# Patient Record
Sex: Male | Born: 1940 | Race: Black or African American | Hispanic: No | Smoking: Current every day smoker
Health system: Southern US, Community
[De-identification: ages and names within clinical notes are randomized; demographics above are authoritative.]

## PROBLEM LIST (undated history)

## (undated) ENCOUNTER — Emergency Department (HOSPITAL_COMMUNITY): Discharge status: Against Medical Advice | Payer: Medicare Other

## (undated) DIAGNOSIS — IMO0002 Reserved for concepts with insufficient information to code with codable children: Secondary | ICD-10-CM

## (undated) DIAGNOSIS — E785 Hyperlipidemia, unspecified: Secondary | ICD-10-CM

## (undated) DIAGNOSIS — I10 Essential (primary) hypertension: Secondary | ICD-10-CM

## (undated) DIAGNOSIS — G928 Other toxic encephalopathy: Secondary | ICD-10-CM

## (undated) DIAGNOSIS — G92 Toxic encephalopathy: Secondary | ICD-10-CM

## (undated) DIAGNOSIS — I255 Ischemic cardiomyopathy: Secondary | ICD-10-CM

## (undated) DIAGNOSIS — I4892 Unspecified atrial flutter: Secondary | ICD-10-CM

## (undated) DIAGNOSIS — C189 Malignant neoplasm of colon, unspecified: Secondary | ICD-10-CM

## (undated) DIAGNOSIS — R4182 Altered mental status, unspecified: Secondary | ICD-10-CM

## (undated) DIAGNOSIS — Z8546 Personal history of malignant neoplasm of prostate: Secondary | ICD-10-CM

## (undated) DIAGNOSIS — F32A Depression, unspecified: Secondary | ICD-10-CM

## (undated) DIAGNOSIS — J449 Chronic obstructive pulmonary disease, unspecified: Secondary | ICD-10-CM

## (undated) DIAGNOSIS — Z91199 Patient's noncompliance with other medical treatment and regimen due to unspecified reason: Secondary | ICD-10-CM

## (undated) DIAGNOSIS — F1011 Alcohol abuse, in remission: Secondary | ICD-10-CM

## (undated) DIAGNOSIS — Z8679 Personal history of other diseases of the circulatory system: Secondary | ICD-10-CM

## (undated) DIAGNOSIS — J189 Pneumonia, unspecified organism: Secondary | ICD-10-CM

## (undated) DIAGNOSIS — Z8669 Personal history of other diseases of the nervous system and sense organs: Secondary | ICD-10-CM

## (undated) DIAGNOSIS — Z8659 Personal history of other mental and behavioral disorders: Secondary | ICD-10-CM

## (undated) DIAGNOSIS — Z9119 Patient's noncompliance with other medical treatment and regimen: Secondary | ICD-10-CM

## (undated) DIAGNOSIS — Z720 Tobacco use: Secondary | ICD-10-CM

## (undated) DIAGNOSIS — F329 Major depressive disorder, single episode, unspecified: Secondary | ICD-10-CM

## (undated) HISTORY — DX: Essential (primary) hypertension: I10

## (undated) HISTORY — DX: Personal history of other diseases of the nervous system and sense organs: Z86.69

## (undated) HISTORY — DX: Reserved for concepts with insufficient information to code with codable children: IMO0002

## (undated) HISTORY — DX: Depression, unspecified: F32.A

## (undated) HISTORY — DX: Alcohol abuse, in remission: F10.11

## (undated) HISTORY — DX: Personal history of malignant neoplasm of prostate: Z85.46

## (undated) HISTORY — DX: Unspecified atrial flutter: I48.92

## (undated) HISTORY — DX: Pneumonia, unspecified organism: J18.9

## (undated) HISTORY — DX: Patient's noncompliance with other medical treatment and regimen due to unspecified reason: Z91.199

## (undated) HISTORY — DX: Personal history of other mental and behavioral disorders: Z86.59

## (undated) HISTORY — DX: Altered mental status, unspecified: R41.82

## (undated) HISTORY — DX: Toxic encephalopathy: G92

## (undated) HISTORY — DX: Other toxic encephalopathy: G92.8

## (undated) HISTORY — DX: Ischemic cardiomyopathy: I25.5

## (undated) HISTORY — PX: APPENDECTOMY: SHX54

## (undated) HISTORY — DX: Major depressive disorder, single episode, unspecified: F32.9

## (undated) HISTORY — DX: Tobacco use: Z72.0

## (undated) HISTORY — DX: Chronic obstructive pulmonary disease, unspecified: J44.9

## (undated) HISTORY — DX: Hyperlipidemia, unspecified: E78.5

## (undated) HISTORY — DX: Patient's noncompliance with other medical treatment and regimen: Z91.19

## (undated) HISTORY — DX: Personal history of other diseases of the circulatory system: Z86.79

---

## 1998-08-03 ENCOUNTER — Emergency Department (HOSPITAL_COMMUNITY): Admission: EM | Admit: 1998-08-03 | Discharge: 1998-08-03 | Payer: Self-pay | Admitting: Emergency Medicine

## 2002-06-06 ENCOUNTER — Emergency Department (HOSPITAL_COMMUNITY): Admission: EM | Admit: 2002-06-06 | Discharge: 2002-06-06 | Payer: Self-pay | Admitting: Emergency Medicine

## 2003-04-21 ENCOUNTER — Emergency Department (HOSPITAL_COMMUNITY): Admission: EM | Admit: 2003-04-21 | Discharge: 2003-04-21 | Payer: Self-pay | Admitting: Emergency Medicine

## 2004-04-28 ENCOUNTER — Inpatient Hospital Stay (HOSPITAL_COMMUNITY): Admission: EM | Admit: 2004-04-28 | Discharge: 2004-05-02 | Payer: Self-pay | Admitting: Psychiatry

## 2004-04-28 ENCOUNTER — Ambulatory Visit: Payer: Self-pay | Admitting: Psychiatry

## 2005-05-18 ENCOUNTER — Inpatient Hospital Stay (HOSPITAL_COMMUNITY): Admission: EM | Admit: 2005-05-18 | Discharge: 2005-05-21 | Payer: Self-pay | Admitting: Emergency Medicine

## 2005-06-02 ENCOUNTER — Emergency Department (HOSPITAL_COMMUNITY): Admission: EM | Admit: 2005-06-02 | Discharge: 2005-06-02 | Payer: Self-pay | Admitting: Emergency Medicine

## 2005-06-03 ENCOUNTER — Ambulatory Visit: Payer: Self-pay | Admitting: *Deleted

## 2005-06-04 ENCOUNTER — Emergency Department (HOSPITAL_COMMUNITY): Admission: EM | Admit: 2005-06-04 | Discharge: 2005-06-04 | Payer: Self-pay | Admitting: Emergency Medicine

## 2005-06-07 ENCOUNTER — Emergency Department (HOSPITAL_COMMUNITY): Admission: EM | Admit: 2005-06-07 | Discharge: 2005-06-07 | Payer: Self-pay | Admitting: Emergency Medicine

## 2005-08-19 ENCOUNTER — Emergency Department (HOSPITAL_COMMUNITY): Admission: EM | Admit: 2005-08-19 | Discharge: 2005-08-19 | Payer: Self-pay | Admitting: Emergency Medicine

## 2005-09-11 ENCOUNTER — Encounter: Payer: Self-pay | Admitting: Emergency Medicine

## 2005-09-11 ENCOUNTER — Ambulatory Visit: Payer: Self-pay | Admitting: Cardiology

## 2005-09-11 ENCOUNTER — Inpatient Hospital Stay (HOSPITAL_COMMUNITY): Admission: EM | Admit: 2005-09-11 | Discharge: 2005-09-17 | Payer: Self-pay | Admitting: Emergency Medicine

## 2005-09-12 ENCOUNTER — Encounter (INDEPENDENT_AMBULATORY_CARE_PROVIDER_SITE_OTHER): Payer: Self-pay | Admitting: *Deleted

## 2005-09-19 ENCOUNTER — Emergency Department (HOSPITAL_COMMUNITY): Admission: EM | Admit: 2005-09-19 | Discharge: 2005-09-19 | Payer: Self-pay | Admitting: Emergency Medicine

## 2006-01-21 DIAGNOSIS — Z8679 Personal history of other diseases of the circulatory system: Secondary | ICD-10-CM

## 2006-01-21 HISTORY — DX: Personal history of other diseases of the circulatory system: Z86.79

## 2006-01-26 ENCOUNTER — Emergency Department (HOSPITAL_COMMUNITY): Admission: EM | Admit: 2006-01-26 | Discharge: 2006-01-26 | Payer: Self-pay | Admitting: Emergency Medicine

## 2006-01-30 ENCOUNTER — Encounter (INDEPENDENT_AMBULATORY_CARE_PROVIDER_SITE_OTHER): Payer: Self-pay | Admitting: *Deleted

## 2006-01-30 ENCOUNTER — Ambulatory Visit (HOSPITAL_COMMUNITY): Admission: RE | Admit: 2006-01-30 | Discharge: 2006-01-30 | Payer: Self-pay | Admitting: *Deleted

## 2006-03-07 ENCOUNTER — Emergency Department (HOSPITAL_COMMUNITY): Admission: EM | Admit: 2006-03-07 | Discharge: 2006-03-07 | Payer: Self-pay | Admitting: Emergency Medicine

## 2006-07-10 ENCOUNTER — Emergency Department (HOSPITAL_COMMUNITY): Admission: EM | Admit: 2006-07-10 | Discharge: 2006-07-10 | Payer: Self-pay | Admitting: Emergency Medicine

## 2006-07-29 ENCOUNTER — Emergency Department (HOSPITAL_COMMUNITY): Admission: EM | Admit: 2006-07-29 | Discharge: 2006-07-29 | Payer: Self-pay | Admitting: Emergency Medicine

## 2006-10-14 ENCOUNTER — Encounter: Payer: Self-pay | Admitting: Cardiology

## 2006-12-11 ENCOUNTER — Encounter: Payer: Self-pay | Admitting: Cardiology

## 2008-05-17 ENCOUNTER — Encounter: Payer: Self-pay | Admitting: Cardiology

## 2009-09-01 ENCOUNTER — Ambulatory Visit: Payer: Self-pay | Admitting: Cardiovascular Disease

## 2009-09-15 ENCOUNTER — Ambulatory Visit: Payer: Self-pay | Admitting: Cardiology

## 2009-09-29 ENCOUNTER — Ambulatory Visit: Payer: Self-pay | Admitting: Cardiology

## 2009-10-05 ENCOUNTER — Ambulatory Visit: Payer: Self-pay | Admitting: Cardiology

## 2009-10-19 ENCOUNTER — Ambulatory Visit: Payer: Self-pay | Admitting: Cardiology

## 2009-11-01 ENCOUNTER — Ambulatory Visit: Payer: Self-pay | Admitting: Cardiovascular Disease

## 2009-11-15 ENCOUNTER — Ambulatory Visit: Payer: Self-pay | Admitting: Cardiology

## 2009-12-06 ENCOUNTER — Ambulatory Visit: Payer: Self-pay | Admitting: Cardiology

## 2010-01-03 ENCOUNTER — Ambulatory Visit: Payer: Self-pay | Admitting: Cardiology

## 2010-01-21 DIAGNOSIS — R4182 Altered mental status, unspecified: Secondary | ICD-10-CM

## 2010-01-21 HISTORY — DX: Altered mental status, unspecified: R41.82

## 2010-02-01 ENCOUNTER — Ambulatory Visit: Payer: Self-pay | Admitting: Cardiology

## 2010-02-19 ENCOUNTER — Ambulatory Visit: Payer: Self-pay | Admitting: Cardiology

## 2010-02-28 ENCOUNTER — Other Ambulatory Visit (INDEPENDENT_AMBULATORY_CARE_PROVIDER_SITE_OTHER): Payer: PRIVATE HEALTH INSURANCE

## 2010-02-28 DIAGNOSIS — Z7901 Long term (current) use of anticoagulants: Secondary | ICD-10-CM

## 2010-02-28 DIAGNOSIS — I4892 Unspecified atrial flutter: Secondary | ICD-10-CM

## 2010-03-14 ENCOUNTER — Other Ambulatory Visit (INDEPENDENT_AMBULATORY_CARE_PROVIDER_SITE_OTHER): Payer: PRIVATE HEALTH INSURANCE

## 2010-03-14 DIAGNOSIS — I4891 Unspecified atrial fibrillation: Secondary | ICD-10-CM

## 2010-03-14 DIAGNOSIS — Z7901 Long term (current) use of anticoagulants: Secondary | ICD-10-CM

## 2010-03-29 ENCOUNTER — Encounter (INDEPENDENT_AMBULATORY_CARE_PROVIDER_SITE_OTHER): Payer: PRIVATE HEALTH INSURANCE

## 2010-03-29 DIAGNOSIS — I4892 Unspecified atrial flutter: Secondary | ICD-10-CM

## 2010-03-29 DIAGNOSIS — Z7901 Long term (current) use of anticoagulants: Secondary | ICD-10-CM

## 2010-04-26 ENCOUNTER — Ambulatory Visit (INDEPENDENT_AMBULATORY_CARE_PROVIDER_SITE_OTHER): Payer: PRIVATE HEALTH INSURANCE | Admitting: *Deleted

## 2010-04-26 DIAGNOSIS — Z7901 Long term (current) use of anticoagulants: Secondary | ICD-10-CM

## 2010-04-26 DIAGNOSIS — I4892 Unspecified atrial flutter: Secondary | ICD-10-CM

## 2010-04-26 LAB — POCT INR: INR: 2.1

## 2010-05-24 ENCOUNTER — Ambulatory Visit (INDEPENDENT_AMBULATORY_CARE_PROVIDER_SITE_OTHER): Payer: PRIVATE HEALTH INSURANCE | Admitting: *Deleted

## 2010-05-24 DIAGNOSIS — I4892 Unspecified atrial flutter: Secondary | ICD-10-CM

## 2010-05-24 NOTE — Patient Instructions (Signed)
Blondie at [facility] notified of pt's Coumadin result and notified of Coumadin instructions.  RN faxed orders of Coumadin instructions and return date to [facility] at fax # [phone].

## 2010-06-07 ENCOUNTER — Encounter: Payer: Medicare Other | Admitting: *Deleted

## 2010-06-07 ENCOUNTER — Encounter: Payer: Self-pay | Admitting: Cardiology

## 2010-06-08 NOTE — Discharge Summary (Signed)
David [name], David [name]              ACCOUNT NO.:  [id number]   MEDICAL RECORD NO.:  [id number]          PATIENT TYPE:  IPS   LOCATION:  0305                          FACILITY:  [facility]   PHYSICIAN:  Jeanice Lim, M.D. DATE OF BIRTH:  December 14, 1940   DATE OF ADMISSION:  04/28/2004  DATE OF DISCHARGE:  05/02/2004                                 DISCHARGE SUMMARY   This is a 70 year old separated African-American male.  The patient  presented with alcohol abuse, drinking alcohol for 50 years, requesting  detox.  The patient has denied any psychotic symptoms and any suicidal  ideation.  Medical problems are colon cancer, status post radiation therapy,  increased cholesterol, and bilateral inguinal hernias.  The patient was  assessed at the emergency department at [facility] with no significant  findings.  The patient did receive some Ativan IM prior to transfer.   LABORATORY DATA:  His TSH was 5.92.  His AST mildly elevated at 39.  Urinalysis is negative.   HOSPITAL COURSE:  The patient was initiated on low-dose Librium, monitoring  his withdrawal symptoms and vital signs.  He was encouraged fluids, working  on relapse prevention and attending individual and group therapy.  The  patient was tolerating his detox well, sleeping well, denied any somatic  complaints.  Reporting some stressors, separation from wife.   CONDITION ON DISCHARGE:  The patient was fully alert, pleasant and  appropriate.  He was looking forward to discharge and voiced a desire to  remain sober.  The patient was having increasing insight and judgment in  regard to his substance abuse.  The patient denied any dangerous ideations.  It was felt the patient was completely detoxed and was sleeping well, taking  trazodone at bedtime.  The patient was discharged on May 02, 2004, full  detoxed and with no dangerous ideations.   DISCHARGE MEDICATIONS:  Trazodone 50 mg taken one at bedtime as needed  p.r.n.   SPECIAL  INSTRUCTIONS:  Arrangements for follow-up at [facility] are to be  done by case management.  The patient was not to use any alcohol or drugs.   DISCHARGE DIAGNOSES:   AXIS I:  Alcohol abuse, rule out dependence.   AXIS II:  Deferred.   AXIS III:  1.  Colon cancer, status post recent radiation.  2.  Elevated cholesterol.  3.  Inguinal hernia.   AXIS IV:  Severe psychosocial stressors.   AXIS V:  Current is 55, estimated this past year was 60.      JO/MEDQ  D:  05/31/2004  T:  06/01/2004  Job:  [id number]

## 2010-06-08 NOTE — H&P (Signed)
NAMESTARSKY, David [name]              ACCOUNT NO.:  [id number]   MEDICAL RECORD NO.:  [id number]          PATIENT TYPE:  IPS   LOCATION:  0305                          FACILITY:  [facility]   PHYSICIAN:  Jeanice Lim, M.D. DATE OF BIRTH:  [birth year]   DATE OF ADMISSION:  04/28/2004  DATE OF DISCHARGE:                         PSYCHIATRIC ADMISSION ASSESSMENT   This is a voluntary admission to the services of Dr. Aleatha Borer.   IDENTIFYING STATEMENT:  This is a 70 year old separated African-American  male.  Apparently he presented to the emergency room at [facility]  yesterday reporting that he had had a drink at 7:30 in the morning, a pint  of wine, that he had been drinking alcohol for 50 years, and he requested  detoxification.  Apparently his urine drug screen was negative.  His alcohol  level was less than 5.  He stated that he needed detox.  He was admitted on  that premise.  He is not exhibiting any signs of withdrawal and has no  other.  He denies any suicidal or homicidal ideation.  He denies any  auditory or visual hallucinations, and he denies any need for  antidepressants.  [facility] sponsored him.  Apparently  he has recently had treatment for colon cancer, finishing radiation therapy,  he thinks, back in December.  His treatment was done in [place].  His  address would appear to be the shelter here in [place], although he  states he has Section 8 housing in [place] and wants to return there if  he does not have to detox.   PAST PSYCHIATRIC HISTORY:  Apparently he has been to [facility] several times in  the past, that last admission being in 2004.  He has been to [facility]  in 1993.  In 2001 he was to [facility].  In the 70s he went to detox in  [place].  He denies any currently prescribed medications.  He  states his only other known health issue is hypercholesterolemia.  He is  prescribed, however, he does not take it and  does not recall what he was  prescribed.   SOCIAL HISTORY:  He went to the 10th grade.  He worked in Designer, fashion/clothing.  He has  been retired about 2 years.  He has been separated many years.  He has three  daughters ages 62, 42 and 67.  He smokes 1 pack of cigarettes per day.  He  reports that he drinks, however, his alcohol level was less than 5.   DRUG ALLERGIES:  There are no known drug allergies.   PHYSICAL EXAMINATION:  As per his workup in the ER.   MENTAL STATUS EXAM:  Today he is alert and oriented x 3.  He is  appropriately groomed.  His clothes need to be washed.  He appears to be  reasonably nourished.  He walked unaided.  His motor is normal.  There was  no tremor.  He has good eye contact.  His speech is a normal rate, rhythm  and tone.  His mood he states is okay.  There have been no behaviors  reported.  He affect is somewhat constricted.  Thought processes are intact,  clear, rational and goal-oriented.  He states he can return to his own home  in [place].  Concentration and memory are intact.  Intelligence is  average.  He denies suicidal or homicidal ideation.  He denies auditory or  visual hallucinations.   ADMISSION DIAGNOSES:   AXIS I:  He presented for as alcohol abuse, rule out dependence, requesting  detox; however, there is no need for withdrawal indicated at this time.  His  admission __________ was a 6 based on self-report.  He was complaining of  feeling sick to his stomach too and feeling anxious.  However, he is  probably homeless and is anxious about that.  He states that this is  internal and is not related to emotions.   AXIS II:  Deferred.   AXIS III:  1.  Colon cancer status post recent radiation therapy.  2.  Known to have increased cholesterol.  3.  Bilateral inguinal hernias.   AXIS IV:  Severe, problems with primary support group. occupational, housing  and economic issues.   AXIS V:  35.   PLAN:  He was informed that the case manager  will be coming to speak with  him to check out whether in fact he still has his place in [place].  If  not, the case manager will be helping him contact family members or  returning to the shelter since he refuses antidepressants.      MD/MEDQ  D:  04/28/2004  T:  04/28/2004  Job:  [id number]

## 2010-06-08 NOTE — Consult Note (Signed)
NAMECAYNE, YOM NO.:  [id number]   MEDICAL RECORD NO.:  [id number]          PATIENT TYPE:  INP   LOCATION:  3703                         [facility]  [facility]   PHYSICIAN:  Luis Abed, MD, FACCDATE OF BIRTH:  10-Dec-1940   DATE OF CONSULTATION:  DATE OF DISCHARGE:                                   CONSULTATION   Mr. Diaz is admitted with some shortness of breath and chest pain. He has  significant left ventricular dysfunction. He also has significant alcohol  abuse drinking a fifth of liquor every 2 days. The patient is admitted to  the hospitalist service. We are asked to see him to help with cardiac care  while he is in [place]. The patient was evaluated fully in [place] in  April 2007. Catheterization at that time revealed a 95% right coronary  lesion and 65% ramus lesion. No intervention was done. The ejection fraction  was in the 25% range. The patient is extremely noncompliant. He also  continues to drink and smoke significantly. He also has atrial flutter.   With his admission, he has some shortness of breath and chest pain. However,  there is no marked heart failure and he has no marked enzyme elevation.   PAST MEDICAL HISTORY:   ALLERGIES:  No known drug allergies.   MEDICATIONS:  The patient really has no idea what medicines he is on and  most probably is not taking any of them. His finances are extremely limited  and in the future I would think that only medicines that he can buy for the  smallest amount of money might be usable by him.   OTHER MEDICAL PROBLEMS:  See the complete list below.   SOCIAL HISTORY:  It is not clear where the patient lives. He says that he  lives in [place], [place]. He smokes. He drinks heavily.   FAMILY HISTORY:  Noncontributory.   REVIEW OF SYSTEMS:  He says that he has had significant weight loss. The  patient does wear glasses at times. He has had no major skin problems. He  does have chest  pain and shortness of breath. He has had no major GI  symptoms. He does have a problem with depression otherwise his review of  systems is negative.   PHYSICAL EXAMINATION:  VITAL SIGNS:  Blood pressure is 90/53 with a pulse of  90. Respiratory rate is 18, temperature is 97.8. The patient is oriented to  person, time and place.  LUNGS:  Clear. Respiratory effort is not labored. Overall, he has chronic  debilitated appearance.  HEENT:  Reveals no xanthelasma. He has a bandage above his right eye. There  is no carotid bruits. There is no jugular venous distention. CARDIAC:  Reveals an S1 with an S2. There are no clicks or significant murmurs.  ABDOMEN:  Soft. There are no masses or bruits. There is no peripheral edema.   Chest x-ray revealed chronic changes. There was no marked heart failure. EKG  reveals atrial flutter. He has a controlled rate. BUN is 11 with a  creatinine of 1.1 and his  hemoglobin was 16.7. TSH is low normal at 0.33.  Alcohol level was elevated on admission and his BNP was 328. A 2-D echo  shows an ejection fraction in the 20-30% range with diffuse hypokinesis.  There is diastolic dysfunction with mild to moderate MR.  There is right  ventricular enlargement and decreased right ventricular function.   PROBLEMS:  1. Atrial flutter that is old.  2. Left ventricular dysfunction with an ejection fraction in the 20-30%      range.  3. Coronary disease with a 95% right coronary lesion and 65% ramus lesion.  4. History of depression.  5. Prostate cancer status post radiation and Lupron.  6. Hypertension.  7. Low TSH with normal T3 and T4.  8. Significant ongoing alcohol problem.  9. Ongoing smoking.   Unfortunately, it will be impossible to treat this patient unless there is  some way that he can begin to stop drinking alcohol and start taking his  medicines. These are the only possible interventions that can help him. Any  other approaches will not be helpful. Any  meds that he is discharged on  should most probably be on the list of availability for $4.00 at [facility].           ______________________________  Luis Abed, MD, [facility]     JDK/MEDQ  D:  09/13/2005  T:  09/13/2005  Job:  [id number]

## 2010-06-08 NOTE — Discharge Summary (Signed)
David [name], David [name]              ACCOUNT NO.:  [id number]   MEDICAL RECORD NO.:  [id number]           PATIENT TYPE:   LOCATION:                                 FACILITY:   PHYSICIAN:  Lonia Blood, M.D.      DATE OF BIRTH:  1940/04/28   DATE OF ADMISSION:  09/11/2005  DATE OF DISCHARGE:  09/16/2005                                 DISCHARGE SUMMARY   PRIMARY CARE PHYSICIAN:  The patient is currently unassigned to [place].   DISCHARGE DIAGNOSES:  1. Acute congestive heart failure exacerbation with cardiomegaly ejection      fraction of 25% secondary to dilated cardiomyopathy.  2. History of ischemic cardiomyopathy with ejection fraction of 25%      previously.  In [facility] 95% RCA, 60% RI apparently.  No cardiac      intervention required this time around.  3. Alcohol abuse.  4. Chronic atrial flutter.  5. History of depression.  6. Prostate cancer status post radiation on Lupron.  7. Hypertension.  8. Dyslipidemia.  9. Tobacco abuse on medication not compliant.   MEDICATIONS:  1. Senokot S 1 tablet q.h.s.  2. Protonix 50 mg daily.  3. Lisinopril 5 mg daily.  4. Aspirin 81 mg daily.  5. Zocor 40 mg daily.  6. The patient is on nicotine patch 40 mg per 24 hours.  7. Multivitamin 1 capsule daily.  8. Thiamin 100 mg daily.  9. Folate 1 mg daily.  10.Lasix 20 mg daily.  11.Coreg 3.125 mg b.i.d.  12.Ativan 1 mg p.o. q.8 h. p.r.n. for agitation.   DISPOSITION:  The patient will be transferred to a skilled nursing facility.  He has experienced poor judgment prior to this admission.  He has not been  taking his medications according.  Now he will require close supervision.  Hence the reason for transfer to skilled nursing facility.   PROCEDURES PERFORMED THIS ADMISSION:  1. Chest x-ray performed on August 22, that shows stable chronic central      airway and interstitial prominence, but no acute findings.  2. CT of his C-spine showed no acute bony injury in the cervical  spine,      degenerative changes, small hypodensity in the right lobe of the      thyroid gland and ultrasound was recommended.  CT of the head without      contrast showed no acute intracranial pathology and no acute bony      injury in the facial bones.  3. A 2-D echo on September 12, 2005 shows an EF of 20-30%, moderate diffuse      left ventricular hypokinesis.  Doppler findings consistent with      restrictive physiology indicative of decreased left ventricle diastolic      compliance and old increased left lateral pressure.  There was dilated      left ventricular as well.  Mild-to-moderate mitral valve regurgitation.      Right ventricle was also mildly dilated.   CONSULTATIONS:   Cardiology Dr. Myrtis Ser.   BRIEF HISTORY AND PHYSICAL:  Please refer to dictated history  and physical  on admission by Dr. Buena Irish.  In short, patient is a 70 year old  male who was homeless.  He came in because he could not breath.  The patient  had been at [facility] earlier that day and was treated for  alcohol intoxication as well as laceration of his head after a fall.  He  came in with severe shortness of breath.  No cough at the time, no fever, no  chills.  He was known to have an EF of only 25% and dilated and ischemic  cardiomyopathy.  He continued to drink alcohol as well as abuse tobacco.  At  that time he showed stable vital signs with a pulse of 110 which is slightly  irregular.  Otherwise respiratory was 20, saturation 96% on room air, but  99% on 2 liters by nasal cannula.  His cardiac exam showed tachycardia.  Lungs showed some rhonchi bilaterally with no rales.   LABS:  Indicated an EKG with atrial flutter and a rate of 111.  BNP was  found to also be markedly elevated, but his cardiac enzymes were normal.  His creatinine was 1.1.  His ABGs showed pCO2 of only 35%.  He was  subsequently admitted for management of acute CHF exacerbation.   HOSPITAL COURSE BY  PROBLEMS:  Problem #1:  CHF EXACERBATION.  The patient  showed extremely poor compliance; however, he is homeless, which may  explain part of the reason.  He was initiated on diuretics. ACE inhibitors  and later on beta blockers.  He seems to have been well-compensated within  48 hours.  Due to his poor compliance and being homeless the patient is  being placed and he agreed to go into a facility where he could get the best  assistance.   Problem #2:  ATRIAL FLUTTER.  This was also controlled using beta blocker.  He is on very low dose Coreg.  Due to his relative hypotension, but he has  been stable at this point.   Problem #3:  CORONARY ARTERY DISEASE.  Serial cardiac enzymes were followed  since the patient has had ischemic cardiomyopathy.  So far his enzymes have  been normal and no further workup was ordered.  We have consulted cardiology  on this and no additional suggestions were made except that the patient  should quit alcohol and take his medications.   Problem #4:  ALCOHOL ABUSE.  The patient has been on thiamin and folate.  He  was watched for possible DTs and he was watched for possible DT, and he  showed signs of DT.  He was on Librium protocol which he completes today.   Problem #5:  TOBACCO ABUSE.  Again, the patient has been counseled and he  has been on nicotine patch, currently on 40 mg a day.  Otherwise the patient  has been stable in the hospital and is ready for discharge at this point.  He should be on 2-gram sodium diet; and he should have his activities slowly  progressive.  Will continue with some thiamin and folate also as  supplements.      Lonia Blood, M.D.  Electronically Signed     LG/MEDQ  D:  09/16/2005  T:  09/16/2005  Job:  [id number]   cc:   Flint Melter, MD

## 2010-06-08 NOTE — Discharge Summary (Signed)
NAMEADEYEMI, HAMAD              ACCOUNT NO.:  [id number]   MEDICAL RECORD NO.:  [id number]          PATIENT TYPE:  EMS   LOCATION:  MAJO                         [facility]ACILITY[facility]  [facility]   PHYSICIAN:  Mobolaji B. Bakare, M.D.DATE OF BIRTH:  Jun 02, 1940   DATE OF ADMISSION:  06/04/2005  DATE OF DISCHARGE:                                 DISCHARGE SUMMARY   CONTINUATION:   HOSPITAL COURSE:  PROBLEM #3 - CARDIOMYOPATHY:  According to records from  [facility], the patient had an echocardiogram on the 19th of April 2007  which showed mild left ventricular enlargement and hypertrophy with severely  decreased contraction and left ventricular ejection fraction of 25%.  He  also had moderately dilated left atrium and decreased right ventricular  contractile function, mildly dilated right atrium.  He had a cardiac  catheterization on the 24th of April 2007 which showed 90% to 95% ostial  lesion involving the right coronary artery, 60% to 65% left ramus lesion,  ejection fraction of 25%.  There were no stents placed.  I believe due to  the patient's noncompliance, medical treatment was preferred.  At that time,  he also had a subclinical hypothyroidism with a lot TSH and normal free T4.  No further intervention was done during this hospitalization.  It is to be  noted that the patient's digoxin was discontinued due to bradycardia and  Coreg was reduced to 3.1 to 5.  He was advised on low-salt intake.   PROBLEM #4 - TOBACCO ABUSE:  The patient was placed on nicotine patch and  consulted on tobacco cessation.   PROBLEM #5 - ALCOHOL ABUSE/DEPENDENCE:  The patient's stated he is ready to  quit drinking, however, he was not forthcoming with plans and he does not  entertain the thoughts for alcohol rehabilitation.  The patient was also  placed on alcohol withdrawal protocol; he did not have any overt withdrawal  during the course of hospitalization.  He completed Ativan protocol.   DISCHARGE  MEDICATIONS:  1.  Aspirin 325 mg daily.  2.  Coreg 3.125 mg t.i.d.  3.  Folic acid 1 mg daily.  4.  Lisinopril 5 mg daily.  5.  Zocor 40 mg daily.  6.  Thiamine 100 mg daily.  7.  Nicotine patch 7 mg daily.   FOLLOWUP:  The patient was to follow up with [facility] in 1-2 weeks.   DIET:  Low-salt diet.   DISPOSITION:  The patient was discharged home to his sister's place, Jossie Ng, who resides in [place].   CONDITION AT DISCHARGE:  Stable.      Mobolaji B. Corky Downs, M.D.  Electronically Signed     MBB/MEDQ  D:  06/04/2005  T:  06/05/2005  Job:  [id number]

## 2010-06-08 NOTE — Discharge Summary (Signed)
NAMESHIGERU, LAMPERT              ACCOUNT NO.:  [id number]   MEDICAL RECORD NO.:  [id number]          PATIENT TYPE:  INP   LOCATION:  3733                         [facility]ACILITY[facility]  [facility]   PHYSICIAN:  Mobolaji B. Bakare, M.D.DATE OF BIRTH:  Apr 22, 1940   DATE OF ADMISSION:  05/18/2005  DATE OF DISCHARGE:  05/21/2005                                 DISCHARGE SUMMARY   PRIMARY CARE PHYSICIAN:  Unassigned.  The patient sees a physician in [place].   DATE OF ADMISSION:  May 18, 2005.   DATE OF DISCHARGE:  May 21, 2005.   CONSULTS:  None.   FINAL DIAGNOSES:  1.  Cardiomyopathy.  2.  Bradycardia.  3.  Alcohol abuse.  4.  Tobacco abuse.  5.  Noncompliance.   PROCEDURES:  Chest x-ray showed bilateral cardiomegaly, no acute findings.   BRIEF HISTORY:  Mr. Jerris Fleer is a 70 year old African-American male  who abuses alcohol.  He presented to the emergency room with vague chest  pain.  He does have history of cardiomyopathy and was recently discharged  from [facility] on the 19th of April.  At that time, he had new-onset  atrial flutter and was started on aspirin.  The patient is not a candidate  for anticoagulation.  He has an ejection fraction of 25%.  He was admitted  for further evaluation and treatment.   HOSPITAL COURSE:  1.  Chest pain.  The patient ruled out for myocardial infarction with three      sets of cardiac enzymes.  He was also noted to be intoxicated.  His      alcohol level was 129.  Urine drug screen was negative.  The atrial      flutter was rate-controlled.  Chest pain was felt to be atypical and      most likely GI-related.  2.  Bradycardia/slow atrial flutter.  The patient was supposed to be on      Coreg 25 mg b.i.d. and digoxin 125 mcg daily.  He was noted on telemetry      to have slow atrial flutter in the 40s.  Digoxin was discontinued.      Coreg was reduced to 3.125 mg.  He was able to tolerate this dose prior      to discharge, and his heart  rate was in the mid 60s.  3.  Cardiomyopathy.  According to records from [facility], the patient had      an echocardiogram on the 19th of      April which showed mild LV enlargement and hypertrophy with severely      decreased contraction, an ejection fraction of 25%, moderately dilated      left atrium   Dictation ended at this point.      Mobolaji B. Corky Downs, M.D.  Electronically Signed     MBB/MEDQ  D:  06/03/2005  T:  06/04/2005  Job:  [id number]

## 2010-06-08 NOTE — H&P (Signed)
David [name], David [name]              ACCOUNT NO.:  [id number]   MEDICAL RECORD NO.:  [id number]          PATIENT TYPE:  INP   LOCATION:  1845                         FACILITY:  [facility]   PHYSICIAN:  Buena Irish, Dr. DATE OF BIRTH:  1940/11/23   DATE OF ADMISSION:  09/11/2005  DATE OF DISCHARGE:                                HISTORY & PHYSICAL   CHIEF COMPLAINT:  Can't breath.   HISTORY OF PRESENT ILLNESS:  David [name] is an alcoholic 70 year old  gentleman who presents today because he cannot breath.  The patient was  actually just released from [facility] emergency department earlier in the  morning, where he was treated for acute intoxication and fall and head  laceration.  The patient reports that he cannot lay down because he gets  very short of breath.  He gets short of breath when he takes a couple of  steps.  He is also having some heaviness in his chest.  He denies any cough.  No fever.  No chills.  The patient says he has felt like this before when he  had an exacerbation of his congestive heart failure.  He also adds that he  has run out of his Lasix and has not had any in several days, 3 days to be  specific.  The patient has missed his Lasix before but he did not get quite  so short of breath so soon.   PAST MEDICAL HISTORY:  1. Atrial flutter.  2. Congestive heart failure with an ejection fraction of 25%.      a.     He had a cardiac catheterization done, May 14, 2005, which       revealed a 95% RCA lesion and a 65% left ramus lesion.  No stents were       placed.  3. He has a history of depression.  4. Prostate cancer, status post radiation and Lupron.  5. He also has hypertension.  6. History of a low TSH with normal T3 and T4.   SOCIAL HISTORY:  He has a long history of alcohol dependence and tobacco  abuse.  No illicit drug use.  The patient has had DTs in the past.   From a previous discharge summary, the patient was taking:  1. Coreg 25 mg p.o.  b.i.d.  2. Lisinopril 5 mg p.o. daily.  3. Aspirin 325 mg p.o. daily.  4. Digoxin 125 mcg p.o. daily.  5. Zocor 40 mg p.o. daily.  6. Lasix.  He says he believes the dose was 10 mg every morning.  The patient has not taken his Lasix and is out of most of his medications at  this time .   FAMILY HISTORY:  Significant for mother with an MI.   REVIEW OF SYSTEMS:  CONSTITUTIONAL:  The patient lost a significant amount  of weight last time he was in the hospital but has not lost any more weight  since.  No fevers or chills.  HEENT:  He denies double vision, headaches, no  sinus problems, no sore throat.  CARDIOVASCULAR:  He is having chest  heaviness as mentioned above.  He is having orthopnea.  He does not have  trouble with lower extremity edema.  RESPIRATORY:  He denies hemoptysis.  He  denies cough.  He does have wheezing periodically.  He has used inhalers in  the past.  GI:  He has heartburn and acid reflux.  He says his stomach feels  like he has an ulcer but he is not vomiting any blood or seen any blood in  his stool.  No abdominal pain.  NEUROLOGIC:  No seizures.  No asymmetric  weakness.  No slurred speech.  INTEGUMENTARY:  No rashes.  He does have the  recently sewn head laceration from last night with stitches.  PSYCHIATRIC:  He does have depression and insomnia.  GU:  No hematuria.   PHYSICAL EXAMINATION:  VITAL SIGNS:  His temp was 98.3, blood pressure  142/78, pulse 110, respiratory rate 20, O2 sat 96% on room air, 99% on 2  liters nasal cannula.  GENERAL:  The patient is slightly tachypneic on evaluation.  HEENT:  Normocephalic atraumatic .  His sclerae are nonicteric.  His oral  mucosa moist.  NECK:  Supple.  He does have some jugular venous distention.  CARDIAC:  Tachycardic but regular.  LUNGS:  Reveal some rhonchi bilaterally.  No rales.  ABDOMEN:  Soft, nontender, nondistended.  Normoactive bowel sounds.  No  masses are appreciated.  EXTREMITIES:  Reveal no  evidence of clubbing, cyanosis, or edema.  He has  faintly palpable DP pulses bilaterally.  NEUROLOGIC:  His cranial nerves II-XII are intact grossly.  He is alert and  oriented x3.  He is appropriate and cooperative.  He has no slurred speech.  He has grossly 5/5 strength in each of his extremities.  Sensory exam is  intact in his upper and lower extremities grossly.  SKIN:  Intact.  Other than the head laceration with no open lesions.  No  significant rashes.   DATA:  The patient's EKG reveals atrial flutter with a rate of 111.  His BNP  is still pending at this time.  His troponin is less than 0.05.  His MB is  4.8.  His sodium is 140, potassium 4.5, chloride 111, BUN is 11, creatinine  1.1, glucose 94.  ABG reveals a pH of 7.39, pCO2 of 35, it is a venous  sample.  His hemoglobin is __________ , hematocrit __________ .  CK-MB  within normal limits at 4.8.   ASSESSMENT/PLAN:  1. Alcoholic 70 year old gentleman presents with chest pain and shortness      of breath and atrial flutter with a rapid rate.  Plan to admit him to      the telemetry unit.  The patient's symptoms are consistent with      congestive heart failure, though he clinically does not have any signs      of rales or edema.  He has received a dose of Lasix.  We will restart      his Lasix, his digoxin, all of his blood pressure medications, and      congestive heart failure medications.  We will rule him out for      myocardial infarction.  We will control his heart rate, put him on      oxygen, and follow him carefully.  The patient does have known coronary      artery disease, so we will be sure he remains pain free.  2. Alcohol abuse.  This is long-standing.  The patient  will need further      counseling to quit.  For his alcohol abuse also we will put him on as      needed Ativan and monitor for any signs of alcohol withdrawal.  He may      need something scheduled as well. 3. Tobacco abuse.  The patient needs  counseling to quit.  4. Hypertension.  The patient's blood pressure is high at this time.  We      will resume his medications for blood pressure which at this point      appear to be just Lisinopril.  5. For his stomach symptoms.  We will start him on Protonix and he should      remain on this.   I did speak to the patient about his code status.  He does want to be full  code.  We will honor this.           ______________________________  Buena Irish, Dr.    CC/MEDQ  D:  09/11/2005  T:  09/11/2005  Job:  [id number]

## 2010-06-08 NOTE — H&P (Signed)
NAMECLEMENTE, David [name]              ACCOUNT NO.:  [id number]   MEDICAL RECORD NO.:  [id number]          PATIENT TYPE:  INP   LOCATION:  1825                         FACILITY:  [facility]   PHYSICIAN:  Hettie Holstein, D.O.    DATE OF BIRTH:  May 14, 1940   DATE OF ADMISSION:  05/18/2005  DATE OF DISCHARGE:                                HISTORY & PHYSICAL   PRIMARY CARE PHYSICIAN:  Unassigned.   CHIEF COMPLAINT:  Chest pain.   HISTORY OF PRESENT ILLNESS:  Mr. David [name] is a 70 year old alcoholic  African-American male who presented to [facility] emergency department via  EMS after he developed chest pain while he was in a bar following discharge  from [facility].  At [facility] emergency department, he was discovered  to have atrial flutter.  His rate was controlled.  Her had vague chest and  epigastric discomfort.  He does have a history of cardiomyopathy.  He was  quite a poor historian, and all history is obtained from records sent from  [facility].   PAST MEDICAL HISTORY:  1.  New atrial flutter diagnosed on May 09, 2005, initiated on aspirin      therapy at [facility].  2.  Systolic heart failure with 2-D echocardiogram on May 09, 2005,      revealing EF of 25%.  3.  History of alcohol dependence.  He drinks about a fifth of liquor over      48 hours.  He has had DTs in the past.  4.  History of prostate cancer, status post radiation and Lupron in 2006.  5.  History of depression.  6.  Tobacco abuse.  7.  He has undergone cardiac catheterization on May 14, 2005, revealing a      90-95% RCA lesion and a 60-65% left ramus lesion.  There were no stents      placed.  8.  He is noted to have low TSH with a normal T3 and T4.   His medications on discharge included:  1.  Coreg 25 mg p.o. b.i.d.  2.  Lisinopril 5 mg p.o. daily.  3.  Aspirin 325 mg daily.  4.  Digoxin 125 mcg daily.  5.  Zocor 40 mg daily.  6.  Nicotine patch 7 mcg q.24h. transdermally.   No  known drug allergies.   SOCIAL HISTORY:  As noted above, Mr. Cu is alcoholic.  He drinks about  a fifth of liquor over a course of two days.  He is homeless.  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   FAMILY HISTORY:  Noncontributory.   REVIEW OF SYSTEMS:  Review of systems at this time is difficult as the  patient is intoxicated.   PHYSICAL EXAMINATION:  VITAL SIGNS:  The patient was evaluated and vital  signs were stable and his heart rate was controlled.  GENERAL:  This is a 70 year old male who appears intoxicated.  He attempts  to answer questions but then quickly falls back to sleep.  He exhibits  features of confabulation.  CARDIOVASCULAR:  Normal S1, S2.  LUNGS:  Clear to auscultation.  ABDOMEN:  Soft, nontender, with the exception of midepigastric discomfort to  deep palpation.  His bowel sounds are normoactive.  EXTREMITIES:  Lower extremities reveal no edema.  NEUROLOGIC:  Neurologic exam reveals the patient to be intoxicated.  He is  following commands and exhibits no focal neurologic deficits.   LABORATORY DATA:  EKG revealed rate-controlled atrial flutter.  Point of  care markers were unremarkable.  His alcohol level was 129.  BUN was 7,  creatinine 0.6, sodium 138, potassium 4.5.  CBC revealed a WBC of 7.0,  hemoglobin 12.9, platelet count of 209.   ASSESSMENT:  1.  Chest pain.  2.  Atrial flutter, apparently chronic.  3.  Alcoholism.  4.  Ischemic cardiomyopathy with coronary artery disease.  5.  Homelessness.  6.  Delirium tremens risk.   PLAN:  The plan at this time is for Mr. Dismuke to be admitted to a  telemetry floor.  His cardiac markers will be cycled.  We will follow his  clinical course and resume his medications that he was on prior to discharge  from [facility].      Hettie Holstein, D.O.  Electronically Signed     ESS/MEDQ  D:  05/18/2005  T:  05/18/2005  Job:  [id number]

## 2010-06-15 ENCOUNTER — Ambulatory Visit (INDEPENDENT_AMBULATORY_CARE_PROVIDER_SITE_OTHER): Payer: Medicare Other | Admitting: *Deleted

## 2010-06-15 ENCOUNTER — Encounter: Payer: Self-pay | Admitting: Cardiology

## 2010-06-15 DIAGNOSIS — I4892 Unspecified atrial flutter: Secondary | ICD-10-CM

## 2010-06-29 ENCOUNTER — Encounter: Payer: Self-pay | Admitting: Cardiology

## 2010-06-29 ENCOUNTER — Ambulatory Visit (INDEPENDENT_AMBULATORY_CARE_PROVIDER_SITE_OTHER): Payer: Medicare Other | Admitting: *Deleted

## 2010-06-29 DIAGNOSIS — I4892 Unspecified atrial flutter: Secondary | ICD-10-CM

## 2010-06-29 DIAGNOSIS — R0989 Other specified symptoms and signs involving the circulatory and respiratory systems: Secondary | ICD-10-CM

## 2010-06-29 LAB — POCT INR: INR: 3.2

## 2010-07-13 ENCOUNTER — Encounter: Payer: Medicare Other | Admitting: *Deleted

## 2010-07-27 ENCOUNTER — Ambulatory Visit (INDEPENDENT_AMBULATORY_CARE_PROVIDER_SITE_OTHER): Payer: Medicare Other | Admitting: *Deleted

## 2010-07-27 ENCOUNTER — Telehealth: Payer: Self-pay | Admitting: *Deleted

## 2010-07-27 DIAGNOSIS — I4892 Unspecified atrial flutter: Secondary | ICD-10-CM

## 2010-07-27 LAB — POCT INR: INR: 2.3

## 2010-07-27 NOTE — Telephone Encounter (Signed)
Called Blondie at [facility]. Advised to stay on same dose of Coumadin. Recheck 8/2

## 2010-08-23 ENCOUNTER — Ambulatory Visit (INDEPENDENT_AMBULATORY_CARE_PROVIDER_SITE_OTHER): Payer: Medicare Other | Admitting: *Deleted

## 2010-08-23 DIAGNOSIS — I4892 Unspecified atrial flutter: Secondary | ICD-10-CM

## 2010-09-21 ENCOUNTER — Ambulatory Visit (INDEPENDENT_AMBULATORY_CARE_PROVIDER_SITE_OTHER): Payer: PRIVATE HEALTH INSURANCE | Admitting: *Deleted

## 2010-09-21 DIAGNOSIS — I4892 Unspecified atrial flutter: Secondary | ICD-10-CM

## 2010-10-14 ENCOUNTER — Inpatient Hospital Stay (HOSPITAL_COMMUNITY)
Admission: EM | Admit: 2010-10-14 | Discharge: 2010-10-17 | DRG: 193 | Disposition: A | Discharge status: Nursing Home (Includes ICF) | Payer: PRIVATE HEALTH INSURANCE | Source: Ambulatory Visit | Attending: Internal Medicine | Admitting: Internal Medicine

## 2010-10-14 ENCOUNTER — Emergency Department (HOSPITAL_COMMUNITY): Payer: PRIVATE HEALTH INSURANCE

## 2010-10-14 DIAGNOSIS — F329 Major depressive disorder, single episode, unspecified: Secondary | ICD-10-CM | POA: Diagnosis present

## 2010-10-14 DIAGNOSIS — F3289 Other specified depressive episodes: Secondary | ICD-10-CM | POA: Diagnosis present

## 2010-10-14 DIAGNOSIS — I2589 Other forms of chronic ischemic heart disease: Secondary | ICD-10-CM | POA: Diagnosis present

## 2010-10-14 DIAGNOSIS — F1011 Alcohol abuse, in remission: Secondary | ICD-10-CM | POA: Diagnosis present

## 2010-10-14 DIAGNOSIS — R946 Abnormal results of thyroid function studies: Secondary | ICD-10-CM | POA: Diagnosis present

## 2010-10-14 DIAGNOSIS — I10 Essential (primary) hypertension: Secondary | ICD-10-CM | POA: Diagnosis present

## 2010-10-14 DIAGNOSIS — G929 Unspecified toxic encephalopathy: Secondary | ICD-10-CM | POA: Diagnosis present

## 2010-10-14 DIAGNOSIS — F17200 Nicotine dependence, unspecified, uncomplicated: Secondary | ICD-10-CM | POA: Diagnosis present

## 2010-10-14 DIAGNOSIS — G92 Toxic encephalopathy: Secondary | ICD-10-CM | POA: Diagnosis present

## 2010-10-14 DIAGNOSIS — Z7901 Long term (current) use of anticoagulants: Secondary | ICD-10-CM

## 2010-10-14 DIAGNOSIS — I2510 Atherosclerotic heart disease of native coronary artery without angina pectoris: Secondary | ICD-10-CM | POA: Diagnosis present

## 2010-10-14 DIAGNOSIS — J189 Pneumonia, unspecified organism: Principal | ICD-10-CM | POA: Diagnosis present

## 2010-10-14 DIAGNOSIS — Z79899 Other long term (current) drug therapy: Secondary | ICD-10-CM

## 2010-10-14 DIAGNOSIS — I4892 Unspecified atrial flutter: Secondary | ICD-10-CM | POA: Diagnosis present

## 2010-10-14 DIAGNOSIS — J4489 Other specified chronic obstructive pulmonary disease: Secondary | ICD-10-CM | POA: Diagnosis present

## 2010-10-14 DIAGNOSIS — Z8546 Personal history of malignant neoplasm of prostate: Secondary | ICD-10-CM

## 2010-10-14 DIAGNOSIS — I5022 Chronic systolic (congestive) heart failure: Secondary | ICD-10-CM | POA: Diagnosis present

## 2010-10-14 DIAGNOSIS — J449 Chronic obstructive pulmonary disease, unspecified: Secondary | ICD-10-CM | POA: Diagnosis present

## 2010-10-14 LAB — DIFFERENTIAL
Basophils Absolute: 0 10*3/uL (ref 0.0–0.1)
Basophils Relative: 0 % (ref 0–1)
Eosinophils Absolute: 0.1 10*3/uL (ref 0.0–0.7)
Eosinophils Relative: 1 % (ref 0–5)
Lymphocytes Relative: 22 % (ref 12–46)
Lymphs Abs: 2.7 10*3/uL (ref 0.7–4.0)
Monocytes Absolute: 1.1 K/uL — ABNORMAL HIGH (ref 0.1–1.0)
Monocytes Relative: 9 % (ref 3–12)
Neutro Abs: 8.5 10*3/uL — ABNORMAL HIGH (ref 1.7–7.7)
Neutrophils Relative %: 68 % (ref 43–77)

## 2010-10-14 LAB — COMPREHENSIVE METABOLIC PANEL
ALT: 21 U/L (ref 0–53)
AST: 24 U/L (ref 0–37)
Albumin: 3.4 g/dL — ABNORMAL LOW (ref 3.5–5.2)
BUN: 7 mg/dL (ref 6–23)
Chloride: 109 mEq/L (ref 96–112)
Glucose, Bld: 117 mg/dL — ABNORMAL HIGH (ref 70–99)
Sodium: 141 mEq/L (ref 135–145)
Total Protein: 7.4 g/dL (ref 6.0–8.3)

## 2010-10-14 LAB — CBC
HCT: 43.2 % (ref 39.0–52.0)
Hemoglobin: 15.3 g/dL (ref 13.0–17.0)
MCH: 32.4 pg (ref 26.0–34.0)
MCHC: 35.4 g/dL (ref 30.0–36.0)
MCV: 91.5 fL (ref 78.0–100.0)
Platelets: 186 10*3/uL (ref 150–400)
RBC: 4.72 MIL/uL (ref 4.22–5.81)
RDW: 13.4 % (ref 11.5–15.5)
WBC: 12.4 10*3/uL — ABNORMAL HIGH (ref 4.0–10.5)

## 2010-10-14 LAB — COMPREHENSIVE METABOLIC PANEL WITH GFR
Alkaline Phosphatase: 73 U/L (ref 39–117)
CO2: 22 meq/L (ref 19–32)
Calcium: 8.9 mg/dL (ref 8.4–10.5)
Creatinine, Ser: 0.5 mg/dL (ref 0.50–1.35)
GFR calc Af Amer: >60 mL/min (ref >60)
GFR calc non Af Amer: >60 mL/min (ref >60)
Potassium: 3.9 meq/L (ref 3.5–5.1)
Total Bilirubin: 0.3 mg/dL (ref 0.3–1.2)

## 2010-10-14 LAB — URINALYSIS, ROUTINE W REFLEX MICROSCOPIC
Bilirubin Urine: NEGATIVE
Glucose, UA: NEGATIVE mg/dL
Ketones, ur: NEGATIVE mg/dL
Leukocytes, UA: NEGATIVE
Nitrite: NEGATIVE
Protein, ur: NEGATIVE mg/dL
Specific Gravity, Urine: 1.014 (ref 1.005–1.030)
Urobilinogen, UA: 1 mg/dL (ref 0.0–1.0)
pH: 6 (ref 5.0–8.0)

## 2010-10-14 LAB — PRO B NATRIURETIC PEPTIDE: Pro B Natriuretic peptide (BNP): 271.1 pg/mL — ABNORMAL HIGH (ref 0–125)

## 2010-10-14 LAB — URINE MICROSCOPIC-ADD ON

## 2010-10-14 LAB — PROTIME-INR
INR: 2.36 — ABNORMAL HIGH (ref 0.00–1.49)
Prothrombin Time: 26.2 seconds — ABNORMAL HIGH (ref 11.6–15.2)

## 2010-10-14 LAB — POCT I-STAT TROPONIN I: Troponin i, poc: 0 ng/mL (ref 0.00–0.08)

## 2010-10-14 LAB — CK TOTAL AND CKMB (NOT AT ARMC)
CK, MB: 2.6 ng/mL (ref 0.3–4.0)
Relative Index: 2 (ref 0.0–2.5)
Total CK: 127 U/L (ref 7–232)

## 2010-10-15 LAB — PROTIME-INR: Prothrombin Time: 27.1 seconds — ABNORMAL HIGH (ref 11.6–15.2)

## 2010-10-15 LAB — URINE CULTURE
Colony Count: NO GROWTH
Culture  Setup Time: 201209240027
Culture: NO GROWTH

## 2010-10-15 LAB — CBC
MCV: 90 fL (ref 78.0–100.0)
Platelets: 179 10*3/uL (ref 150–400)
RDW: 13.4 % (ref 11.5–15.5)
WBC: 11.5 10*3/uL — ABNORMAL HIGH (ref 4.0–10.5)

## 2010-10-15 LAB — BASIC METABOLIC PANEL
CO2: 23 mEq/L (ref 19–32)
Calcium: 8.8 mg/dL (ref 8.4–10.5)
Creatinine, Ser: <0.47 mg/dL — ABNORMAL LOW (ref 0.50–1.35)

## 2010-10-15 LAB — CARDIAC PANEL(CRET KIN+CKTOT+MB+TROPI)
Relative Index: 1.6 (ref 0.0–2.5)
Total CK: 197 U/L (ref 7–232)
Total CK: 225 U/L (ref 7–232)
Troponin I: <0.3 ng/mL (ref <0.30)

## 2010-10-15 LAB — APTT: aPTT: 42 seconds — ABNORMAL HIGH (ref 24–37)

## 2010-10-15 LAB — MRSA PCR SCREENING: MRSA by PCR: NEGATIVE

## 2010-10-15 LAB — CK TOTAL AND CKMB (NOT AT ARMC)
CK, MB: 3.7 ng/mL (ref 0.3–4.0)
Relative Index: 0.6 (ref 0.0–2.5)
Total CK: 589 U/L — ABNORMAL HIGH (ref 7–232)

## 2010-10-15 LAB — DIFFERENTIAL
Basophils Absolute: 0 10*3/uL (ref 0.0–0.1)
Basophils Relative: 0 % (ref 0–1)
Eosinophils Absolute: 0.1 10*3/uL (ref 0.0–0.7)
Eosinophils Relative: 1 % (ref 0–5)
Neutrophils Relative %: 73 % (ref 43–77)

## 2010-10-15 LAB — TROPONIN I: Troponin I: <0.3 ng/mL (ref <0.30)

## 2010-10-15 LAB — TSH: TSH: 0.176 u[IU]/mL — ABNORMAL LOW (ref 0.350–4.500)

## 2010-10-16 LAB — T3, FREE: T3, Free: 2.7 pg/mL (ref 2.3–4.2)

## 2010-10-16 LAB — PROTIME-INR
INR: 1.91 — ABNORMAL HIGH (ref 0.00–1.49)
Prothrombin Time: 22.2 seconds — ABNORMAL HIGH (ref 11.6–15.2)

## 2010-10-16 LAB — T4, FREE: Free T4: 0.76 ng/dL — ABNORMAL LOW (ref 0.80–1.80)

## 2010-10-19 ENCOUNTER — Ambulatory Visit (INDEPENDENT_AMBULATORY_CARE_PROVIDER_SITE_OTHER): Payer: PRIVATE HEALTH INSURANCE | Admitting: *Deleted

## 2010-10-19 DIAGNOSIS — I4892 Unspecified atrial flutter: Secondary | ICD-10-CM

## 2010-10-21 LAB — CULTURE, BLOOD (ROUTINE X 2)
Culture  Setup Time: 201209241021
Culture  Setup Time: 201209241021
Culture: NO GROWTH
Culture: NO GROWTH

## 2010-10-24 NOTE — H&P (Signed)
NAMELEGACY, LACIVITA NO.:  [id number]  MEDICAL RECORD NO.:  [id number]  LOCATION:  [facility]                         FACILITY:  [facility]  PHYSICIAN:  Carlota Raspberry, MD         DATE OF BIRTH:  04-19-1940  DATE OF ADMISSION:  10/14/2010 DATE OF DISCHARGE:                             HISTORY & PHYSICAL   PRIMARY CARE PHYSICIAN:  Dr. Lysle Rubens with [facility] with contact information of [phone].  CHIEF COMPLAINT:  Altered mental status.  HISTORY OF PRESENT ILLNESS:  This is a 70 year old male with a history of ischemic cardiomyopathy with an EF of 20% to 30% on TEE in 2008, chronic atrial flutter at least since 2007, prostate cancer status post radiation and Lupron with heavy prior alcohol and tobacco abuse who presents with altered mental status and found to have pneumonia.  Per ED discussion with the staff at [facility], the patient's baseline is normally responding to questions, getting up, walking around, able to express his needs, can go outside and smoke, however, through today he apparently was more confused, not responding to questions appropriately, staring off into space and looking confused.  Also per report, the nursing home told the EMS that he had, had an episode of urinary incontinence.  He was brought to the emergency room where initial vital signs were 97.7, blood pressure 141/67, pulse 50.  Through his workup in the ED, he was found to have a chest x-ray with a right middle lobe infiltrate and a white count of 12.4.  His T-max in the ED was 100.7.  He was also found to be in atrial flutter on EKG.  He is therefore being admitted to [facility] for further management of healthcare-associated pneumonia and at present we are awaiting a CT head given his altered mental status.  REVIEW OF SYSTEMS:  Otherwise unobtainable as the patient is unable to carry on conversation, but he is able to express, no difficulty breathing, chest pain, abdominal  pain, or any other acute symptoms right now.  The patient is disoriented and cannot tell where he is or why he is here.  He is able to answer yes or no questions.  PAST MEDICAL HISTORY: 1. CAD with ischemic cardiomyopathy, last EF 20% to 30% on TEE January     2008, dilated LV with moderate diffuse LV hypokinesis, RV systolic     function mildly reduced 2. Chronic atrial flutter noted in discharge summary in August 2007     with negative TEE in January 2008 followed by cardioversion. 3. Prostate cancer status post radiation, Lupron. 4. Hypertension. 5. Alcohol abuse, prior heavy drinking. 6. Tobacco abuse. 7. Depression. 8. Status post appendectomy. 9. History of low TSH with normal T3/T4.  MEDICATION LIST:  Reconciled by pharmacy and include, 1. Cymbalta 60 mg daily. 2. Topiramate 25 mg daily at bedtime. 3. Crestor 20 mg every evening. 4. Invega 6 mg daily. 5. Seroquel 50 mg twice daily. 6. Multivitamins daily. 7. Senokot and Colace daily. 8. Carvedilol 3.125 one tablet twice daily. 9. Robitussin. 10.ProAir inhalers.11.Warfarin 5 mg on Monday, Tuesday, Wednesday, Thursday, Friday, and     Saturday. 12.Mirtazapine 15 mg daily. 13.Meloxicam 7.5  mg daily. 14.Tiotropium 18 mcg inhaled.  ALLERGIES:  No known drug allergies.  SOCIAL HISTORY:  Unable to be fully reconciled, but the patient lives at [facility].  Per review of e-chart, it appears that the patient was homeless in 2007 or 2008 and had an admission with decompensated heart failure and appears to be living at [facility] thereafter.  Per e-chart, he also has history of heavy drinking and has numerous ED visits for intoxication  FAMILY HISTORY:  Unable to be obtained.  PHYSICAL EXAMINATION:  VITAL SIGNS:  Pulse 95, 100% on room air, 117/66 with systolics in the 110s on the ED monitor, respirations 14. GENERAL:  He is a tall thin male in no distress.  He is completely disoriented, thinks it is June and unable to tell  what year it is.  He does not know where he is or why he is here and unable to tell where he lives.  He does, however, appear to be in any cardiopulmonary distress. HEENT:  Pupils are equal, round, and reactive to light.  His extraocular muscles are intact.  His sclerae are clear.  His mouth is with poor dentition, but overall with no gross lesions noted. LUNGS:  Very grossly rhonchorous bilaterally with inspiration and expiration.  There are no wheezes or crackles. HEART:  Very soft and difficult to hear S1 and S2, but I do not hear any adventitious heart sounds. ABDOMEN:  Scaphoid, soft, nontender, nondistended, and benign. EXTREMITIES:  Warm, well-perfused without cyanosis or clubbing.  He has no bilateral lower extremity edema and in fact he has almost cachectic- appearing arms and legs that are very thin. NEUROLOGIC:  He is alert, but completely disoriented.  His speech is stuttering and a bit slurred, but he is able to answer questions with simple, one-word answers.  He is able to hold his arms above his head for several seconds and lift his legs off the ED stretcher also for several seconds.  No other focal neurological deficits are noted.  LABORATORY DATA:  White blood cell count is 12.4 with a normal differential, hematocrit 43.2, platelets 186, INRs 2.3.  Chemistry is normal with a renal function of 7 and 0.5.  Normal LFTs.  Albumin 3.4, calcium 8.9.  BMP 271.  Other cardiac enzymes are negative x1.  UA shows small blood, few bacteria, otherwise normal.  Blood culture x2 and urine culture are pending.  Chest x-ray, infiltrate in the lateral aspect of the right middle lobe.  CT head has been ordered, but has not been done yet.  EKG is with obvious flutter waves in the rhythm strip, it is at 107 beats per minute with a normal axis.  His QRS' are 134 milliseconds in a left bundlish-type pattern.  The ST-T segments show T-wave inversions in the inferior and lateral leads, but  there is not a lot of gross ST elevation or ST depression.  In comparison to prior, he is now in atrial flutter in the T-wave inversions, in the lateral leads are deep her and are now also present in his inferior leads.  IMPRESSION:  This is a 70 year old male with a history of ischemic cardiomyopathy and chronic systolic heart failure, chronic atrial flutter, history of prior alcohol abuse who presents with altered mental status and was found to have a right-sided pneumonia. 1. Pneumonia.  We will cover him for HCAP given nursing home     residence.  He is currently saturating well on room air at present.  We will cover him with vancomycin and Zosyn and give him scheduled     albuterol and ipratropium nebs given his gross rhonchi.  Followup     blood and urine cultures.  1. Altered mental status.  I am not sure what his baseline is and how     conversant he is at baseline, but per discussion with [facility],     he is at least able to talk, express his needs, and walk.  He is     alert, but just not oriented, I think infectious etiology is high     on the differential.  Atrial flutter, which appears to be new is     also a consideration, but I think much less likely.  He has a CT head ordered to rule out intracranial pathology, which we will follow up, but outside of this would just continue to monitor.  1. Atrial flutter appears to be new in comparison to 2007.  He is     anticoagulated on daily Coumadin, which we will continue.  We will     also trend out his enzymes for rule out MI protocol given some new     nonspecific EKG changes and his T-waves.  We will also rate control     him with his home carvedilol, but he is not having extensive RVR     present.  1. History of coronary artery disease/ischemic CMP.  He does not     appear volume overloaded by his exam.  His chest x-ray appears     clear, saturating well on room air.  We will continue his home     carvedilol and  statin and we will not diurese him.  1. Psychiatry.  He is taking mirtazapine, Seroquel, paliperidone,     Topamax, and duloxetine for an unclear reason to me.  There are no     specific psych or neurological diagnoses listed in his past     medical.  For now, we will continue them.  1. Fluid, electrolytes, and nutrition.  Put him in for a heart-healthy     diet.  No IV fluids for now.  1. Prophylaxis.  Subcutaneous heparin, Zofran, and Tylenol.  1. IV access.  He has a peripheral IV.  1. Code status.  Unable to be addressed.  We will presume full for     now.  The patient will be admitted to telemetry team 6.          ______________________________ Carlota Raspberry, MD     EB/MEDQ  D:  10/15/2010  T:  10/15/2010  Job:  [id number]  Electronically Signed by Carlota Raspberry MD on 10/24/2010 12:21:57 PM

## 2010-10-29 ENCOUNTER — Ambulatory Visit (INDEPENDENT_AMBULATORY_CARE_PROVIDER_SITE_OTHER): Payer: PRIVATE HEALTH INSURANCE | Admitting: *Deleted

## 2010-10-29 DIAGNOSIS — I4892 Unspecified atrial flutter: Secondary | ICD-10-CM

## 2010-11-06 LAB — I-STAT 8, (EC8 V) (CONVERTED LAB)
BUN: 4 — ABNORMAL LOW
Bicarbonate: 21.3
Hemoglobin: 15
Operator id: 288831
Sodium: 140
TCO2: 22

## 2010-11-06 LAB — ETHANOL: Alcohol, Ethyl (B): 215 — ABNORMAL HIGH

## 2010-11-07 LAB — BASIC METABOLIC PANEL
Calcium: 8.6
Creatinine, Ser: 0.93
GFR calc Af Amer: >60

## 2010-11-07 LAB — CBC
Hemoglobin: 13.7
MCHC: 34.2
Platelets: 171
Platelets: 344
RBC: 3.91 — ABNORMAL LOW
RDW: 13
WBC: 7.4

## 2010-11-07 LAB — DIFFERENTIAL
Basophils Absolute: 0.1
Basophils Relative: 0
Lymphocytes Relative: 15
Lymphs Abs: 2
Monocytes Relative: 8
Neutro Abs: 4.7
Neutro Abs: 8.8 — ABNORMAL HIGH
Neutrophils Relative %: 62
Neutrophils Relative %: 75

## 2010-11-07 LAB — PROTIME-INR
INR: 1
Prothrombin Time: 13.8

## 2010-11-07 LAB — ETHANOL: Alcohol, Ethyl (B): 182 — ABNORMAL HIGH

## 2010-11-07 NOTE — Discharge Summary (Signed)
David [name], David [name] NO.:  [id number]  MEDICAL RECORD NO.:  [id number]  LOCATION:  2034                         FACILITY:  [facility]  PHYSICIAN:  David Scott, MD     DATE OF BIRTH:  [birth year]  DATE OF ADMISSION:  10/14/2010 DATE OF DISCHARGE:  10/17/2010                        DISCHARGE SUMMARY - REFERRING   PRIMARY CARE PHYSICIAN:  David Emery, MD at [facility].  DISCHARGE DIAGNOSES: 1. Right middle lobe community-acquired pneumonia. 2. Toxic metabolic encephalopathy, resolved. 3. Chronic atrial fibrillation, on Coumadin. 4. Tobacco abuse. 5. History of coronary artery disease and ischemic cardiomyopathy. 6. History of prostate cancer. 7. Chronic obstructive pulmonary disease with mild bronchospasm,     improved. 8. Hypertension. 9. History of prior heavy alcohol abuse. 10.History of depression. 11.History of abnormal thyroid function tests.  DISCHARGE MEDICATIONS: 1. Mucinex 600 mg p.o. b.i.d. for 1 week, then discontinue. 2. Levofloxacin 500 mg p.o. daily for an additional 5 days. 3. Nicotine 14 mg per 24-hour patch transdermally daily for 1 month,     then to taper. 4. Mirtazapine changed to 15 mg p.o. at bedtime. 5. Carvedilol 3.125 mg p.o. b.i.d. 6. Crestor 20 mg p.o. q.p.m. 7. Cymbalta 60 mg p.o. daily. 8. Invega 6 mg p.o. daily. 9. Meloxicam 7.5 mg p.o. daily. 10.Multivitamins over-the-counter one tablet p.o. daily. 11.ProAir 2 puffs inhaled q.6 hourly p.r.n. dyspnea. 12.Quetiapine 50 mg p.o. b.i.d. 13.Senokot-S 1 tablet p.o. at bedtime. 14.Spiriva 18 mcg inhaled daily. 15.Topamax 25 mg p.o. at bedtime. 16.Tussin DM OTC 10 mL p.o. q.i.d. 17.Warfarin 5 mg tablet, 5 mg daily except 7.5 mg on Sundays.  He will     receive today's dose in the hospital.  Resume October 18, 2010.     This is via mouth.  IMAGING: 1. CT of the head without contrast on October 15, 2010, impression.     a.     No evidence of traumatic  intracranial injury or fracture.      No acute intracranial pathology seen on CT.     b.     Mild soft tissue swelling noted overlying the right      posterior frontal calvarium.     c.     Scattered small vessel ischemic microangiopathy. 2. Chest x-ray on October 14, 2010, impression, infiltrate in the     lateral aspect of the right middle lobe.  PERTINENT LABS:  INR today 1.93, PTT 28, free T3 2.7, free T4 0.76, TSH 0.429.  Blood cultures x2, no growth to date.  Urine culture, no growth. Cardiac enzymes cycled and negative.  Basic metabolic panel unremarkable except for glucose of 113.  CBC shows hemoglobin 14, hematocrit 41, white blood cell 11.5 and platelets 179.  MRSA PCR negative.  Urinalysis showed 0-2 white blood cells and few bacteria.  Pro-BNP was 271.  LFT significant for albumin 3.4.  CONSULTATIONS:  None.  DIET:  Heart healthy diet.  ACTIVITY:  Out of bed with assist or supervision and increase activity gradually.  COMPLAINTS:  The patient indicates that he is feeling much better.  He has minimal dry cough.  He denies dyspnea or chest pain.  PHYSICAL EXAMINATION:  GENERAL:  The patient is in no obvious distress. VITAL SIGNS:  Temperature 98.2 degrees Fahrenheit, pulse 104 per minute and irregular, respiration 18 per minute, blood pressure 155/87 mmHg and saturating at 92% on room air. RESPIRATORY SYSTEM:  Fair air entry bilaterally.  Occasional bilateral rhonchi.  No increased work of breathing. CARDIOVASCULAR SYSTEM:  First and second heart sounds heard regular.  No JVD or murmur. ABDOMEN:  Nondistended, nontender, soft, normal bowel sounds heard. CENTRAL NERVOUS SYSTEM:  The patient is awake, alert, oriented x3 with no focal neurological deficits.  HOSPITAL COURSE:  David [name] is a 69 year old male patient with history of ischemic cardiomyopathy with EF of 20% to 30% on TEE in 2008, chronic atrial flutter, on chronic Coumadin, prostate cancer, status  post radiation and Lupron, previous history of alcohol and ongoing tobacco abuse who presented with altered mental status.  In the emergency room, he was found to have right middle lobe pneumonia. 1. Right middle lobe community-acquired pneumonia.  The patient was     admitted to the hospital.  He was initially suspected to have     healthcare-acquired pneumonia.  He was empirically placed on IV     Zosyn and vancomycin.  He continued to be afebrile.  His T-max in     the ED was 100.7 and had a white cell count of 12,000.  With these     measures, the patient did quite well.  He has minimal dry cough     which is improved since admission.  He has not have any dyspnea or     chest pain.  He will complete a total one week's course of     antibiotics.  Recommend repeating chest x-ray in a couple of weeks     to ensure resolution of the abnormality and chest x-ray, otherwise     he will need further evaluation. 2. COPD with mild bronchospasm.  Yesterday, the patient has some     rhonchi bilaterally.  He however does not complain of any dyspnea.     He received a single dose of Solu-Medrol and the bronchitis have     decreased significantly.  He has been counseled repeatedly     regarding tobacco cessation and he requests a nicotine patch which     is provided. 3. Toxic metabolic encephalopathy, possibly secondary to acute illness     from pneumonia.  Unclear if he has an underlying dementia.  This     has clinically resolved and this patient is awake, alert and     coherent. 4. Atrial flutter with mostly controlled ventricular rate.  His INR     was therapeutic on arrival, but has dropped down slightly.  He will     receive a higher dose of Coumadin 7.5 mg before hospital discharge     today.  Recommend repeating his INR tomorrow and adjust Coumadin     dose as deemed necessary. 5. Abnormal thyroid function tests.  Clinically, he looks euthyroid.     Recommend follow up of these thyroid  function tests in 4 to 6 weeks     from discharge. 6. Tobacco abuse.  Cessation counseling done.  Nicotine patch. 7. History of coronary artery disease and ischemic cardiomyopathy.     The patient did not complain of any chest pain and this was stable. 8. History of prostate cancer, status post radiation and Lupron.  DISPOSITION:  The patient will be discharged to an assisted living facility in stable condition.  FOLLOW  UP RECOMMENDATIONS: 1. Repeat PT/INR tomorrow, that is October 18, 2010 and adjust     Coumadin dosage appropriately. 2. Follow up of chest x-ray in a couple of weeks to ensure resolution     of the right middle lobe pneumonia finding. 3. Repeat thyroid function tests in 4 to 6 weeks from hospital     discharge. 4. Follow up with his primary care physician in the next 5 to 7 days.  Time taken in coordinating this discharge was 30 minutes.     David Scott, MD     AH/MEDQ  D:  10/17/2010  T:  10/17/2010  Job:  [id number]  cc:   David Emery, MD  Electronically Signed by David Scott MD on 11/07/2010 07:38:34 AM

## 2010-11-26 ENCOUNTER — Ambulatory Visit (INDEPENDENT_AMBULATORY_CARE_PROVIDER_SITE_OTHER): Payer: PRIVATE HEALTH INSURANCE | Admitting: *Deleted

## 2010-11-26 DIAGNOSIS — I4892 Unspecified atrial flutter: Secondary | ICD-10-CM

## 2010-11-26 LAB — POCT INR: INR: 2.3

## 2010-12-24 ENCOUNTER — Ambulatory Visit (INDEPENDENT_AMBULATORY_CARE_PROVIDER_SITE_OTHER): Payer: PRIVATE HEALTH INSURANCE | Admitting: *Deleted

## 2010-12-24 DIAGNOSIS — I4892 Unspecified atrial flutter: Secondary | ICD-10-CM

## 2011-02-04 ENCOUNTER — Ambulatory Visit (INDEPENDENT_AMBULATORY_CARE_PROVIDER_SITE_OTHER): Payer: PRIVATE HEALTH INSURANCE | Admitting: *Deleted

## 2011-02-04 DIAGNOSIS — I4892 Unspecified atrial flutter: Secondary | ICD-10-CM

## 2011-02-04 LAB — POCT INR: INR: 2.5

## 2011-03-18 ENCOUNTER — Ambulatory Visit (INDEPENDENT_AMBULATORY_CARE_PROVIDER_SITE_OTHER): Payer: PRIVATE HEALTH INSURANCE | Admitting: Pharmacist

## 2011-03-18 DIAGNOSIS — I4892 Unspecified atrial flutter: Secondary | ICD-10-CM

## 2011-03-18 LAB — POCT INR: INR: 2.4

## 2011-03-25 ENCOUNTER — Ambulatory Visit (HOSPITAL_COMMUNITY)
Admission: RE | Admit: 2011-03-25 | Discharge: 2011-03-25 | Disposition: A | Discharge status: Home / Self Care | Payer: PRIVATE HEALTH INSURANCE | Source: Ambulatory Visit | Attending: Oral Surgery | Admitting: Oral Surgery

## 2011-03-25 ENCOUNTER — Other Ambulatory Visit: Payer: Self-pay

## 2011-03-25 DIAGNOSIS — I2510 Atherosclerotic heart disease of native coronary artery without angina pectoris: Secondary | ICD-10-CM | POA: Insufficient documentation

## 2011-03-25 DIAGNOSIS — Z0189 Encounter for other specified special examinations: Secondary | ICD-10-CM

## 2011-04-10 ENCOUNTER — Encounter: Payer: Self-pay | Admitting: *Deleted

## 2011-04-11 ENCOUNTER — Ambulatory Visit (INDEPENDENT_AMBULATORY_CARE_PROVIDER_SITE_OTHER): Payer: PRIVATE HEALTH INSURANCE | Admitting: Cardiology

## 2011-04-11 ENCOUNTER — Ambulatory Visit (INDEPENDENT_AMBULATORY_CARE_PROVIDER_SITE_OTHER)
Admission: RE | Admit: 2011-04-11 | Discharge: 2011-04-11 | Disposition: A | Discharge status: Home / Self Care | Payer: PRIVATE HEALTH INSURANCE | Source: Ambulatory Visit | Attending: Cardiology | Admitting: Cardiology

## 2011-04-11 ENCOUNTER — Encounter: Payer: Self-pay | Admitting: Cardiology

## 2011-04-11 VITALS — BP 128/64 | HR 82 | Ht 72.0 in | Wt 153.4 lb

## 2011-04-11 DIAGNOSIS — R059 Cough, unspecified: Secondary | ICD-10-CM

## 2011-04-11 DIAGNOSIS — I2589 Other forms of chronic ischemic heart disease: Secondary | ICD-10-CM

## 2011-04-11 DIAGNOSIS — R05 Cough: Secondary | ICD-10-CM

## 2011-04-11 DIAGNOSIS — I4892 Unspecified atrial flutter: Secondary | ICD-10-CM

## 2011-04-11 DIAGNOSIS — I2510 Atherosclerotic heart disease of native coronary artery without angina pectoris: Secondary | ICD-10-CM

## 2011-04-11 DIAGNOSIS — I255 Ischemic cardiomyopathy: Secondary | ICD-10-CM

## 2011-04-11 DIAGNOSIS — Z720 Tobacco use: Secondary | ICD-10-CM

## 2011-04-11 DIAGNOSIS — F17200 Nicotine dependence, unspecified, uncomplicated: Secondary | ICD-10-CM

## 2011-04-11 NOTE — Assessment & Plan Note (Signed)
He has no evidence of volume overload. He is asymptomatic. We will continue with conservative medical therapy.

## 2011-04-11 NOTE — Assessment & Plan Note (Signed)
He did have a right middle lobe pneumonia in September. He has a persistent cough. I recommended a followup chest x-ray. If his pneumonia has cleared and his cough is probably just related to his chronic smoking. If he has a persistent infiltrate he may need chest CT to rule out cancer.

## 2011-04-11 NOTE — Patient Instructions (Signed)
Continue your current medication.  We will get a CXR today.  Keep your follow up in the coumadin clinic.  I will see you in 1 year.

## 2011-04-11 NOTE — Progress Notes (Signed)
David [name] Date of Birth: 03/15/1940 Medical Record #[id number]  History of Present Illness: David [name] is seen after a long absence. He was last seen in September of 2011. He is a 71 year old African American male who has a history of ischemic cardiomyopathy with ejection fraction of 20-30%. Prior cardiac catheterization demonstrated occlusion of the right coronary with moderate disease in an intermediate branch. He also has chronic atrial flutter. He had cardioversion in 2008 but has since had recurrence. He has ongoing tobacco use. He is on chronic anticoagulation and has been followed in our Coumadin clinic. He lives in assisted living. He was hospitalized in September with a right middle lobe pneumonia. He has COPD exacerbation. He was treated with antibiotics and discharged. He continues to smoke one half pack per day. He denies any chest pain, shortness of breath, dizziness, or palpitations. He has had no increase in edema. He does have a persistent cough.  Current Outpatient Prescriptions on File Prior to Visit  Medication Sig Dispense Refill  . albuterol (PROVENTIL HFA;VENTOLIN HFA) 108 (90 BASE) MCG/ACT inhaler Inhale 2 puffs into the lungs every 6 (six) hours as needed.      . carvedilol (COREG) 3.125 MG tablet Take 3.125 mg by mouth 2 (two) times daily with a meal.      . DULoxetine (CYMBALTA) 60 MG capsule Take 60 mg by mouth daily.      . folic acid (FOLVITE) 1 MG tablet Take 1 mg by mouth daily.      Marland Kitchen guaiFENesin (MUCINEX) 600 MG 12 hr tablet Take 1,200 mg by mouth as needed.      . meloxicam (MOBIC) 7.5 MG tablet Take 7.5 mg by mouth daily.      . Mirtazapine (REMERON PO) Take by mouth daily.      . Multiple Vitamin (MULTIVITAMIN) tablet Take 1 tablet by mouth daily.      . paliperidone (INVEGA) 6 MG 24 hr tablet Take 6 mg by mouth every morning.      . pantoprazole (PROTONIX) 20 MG tablet Take 20 mg by mouth daily.      . QUEtiapine (SEROQUEL) 50 MG tablet Take 50 mg by  mouth 2 (two) times daily.      . rosuvastatin (CRESTOR) 20 MG tablet Take 20 mg by mouth daily.      Marland Kitchen senna (SENOKOT) 8.6 MG tablet Take 1 tablet by mouth daily.      Marland Kitchen tiotropium (SPIRIVA) 18 MCG inhalation capsule Place 18 mcg into inhaler and inhale daily.      Marland Kitchen topiramate (TOPAMAX) 25 MG tablet Take 25 mg by mouth daily.      Marland Kitchen warfarin (COUMADIN) 5 MG tablet Take 5 mg by mouth daily. Or as directed        No Known Allergies  Past Medical History  Diagnosis Date  . Community acquired pneumonia     Right middle lobe community-acquired pneumonia  . Toxic metabolic encephalopathy   . Chronic atrial flutter   . Tobacco abuse   . History of coronary artery disease   . Ischemic cardiomyopathy   . History of prostate cancer   . Chronic obstructive pulmonary disease     with mild bronchospasm, improved  . Hypertension   . History of alcohol abuse     History of prior heavy alcohol abuse  . History of depression   . Altered mental status 2012   . Noncompliance     Past Surgical History  Procedure Date  .  Appendectomy     History  Smoking status  . Current Everyday Smoker -- 0.5 packs/day for 40 years  . Types: Cigarettes  Smokeless tobacco  . Never Used    History  Alcohol Use  . Yes    History reviewed. No pertinent family history.  Review of Systems: The review of systems is positive for persistent  cough.  All other systems were reviewed and are negative.  Physical Exam: BP 128/64  Pulse 82  Ht 6' (1.829 m)  Wt 69.582 kg (153 lb 6.4 oz)  BMI 20.80 kg/m2 He is a frail black male in no acute distress. He is normocephalic, atraumatic. Pupils are equal round and reactive to light and accommodation. His mucous membranes are moist. Dentition is in poor repair. Neck is supple no JVD, adenopathy, thyromegaly, or bruits. Cardiac exam reveals an irregular rate and rhythm with a grade 2/6 holosystolic murmur at the apex. There is no S3. Abdomen is soft and nontender  without masses or ascites. Extremities are without edema. He has poor pedal pulses. Skin is dry. He is alert and oriented x3. His mood is appropriate. Cranial nerves II through XII are intact. LABORATORY DATA: ECGs were reviewed from his hospital stay and demonstrated atrial flutter with a controlled ventricular response.  Assessment / Plan:

## 2011-04-11 NOTE — Assessment & Plan Note (Signed)
He has chronic atrial flutter. His rate is well controlled on carvedilol. He is on chronic anticoagulant therapy for stroke prophylaxis. We'll continue with his therapy.

## 2011-05-03 ENCOUNTER — Ambulatory Visit (INDEPENDENT_AMBULATORY_CARE_PROVIDER_SITE_OTHER): Payer: PRIVATE HEALTH INSURANCE | Admitting: Pharmacist

## 2011-05-03 DIAGNOSIS — I4892 Unspecified atrial flutter: Secondary | ICD-10-CM

## 2011-06-14 ENCOUNTER — Ambulatory Visit (INDEPENDENT_AMBULATORY_CARE_PROVIDER_SITE_OTHER): Payer: PRIVATE HEALTH INSURANCE

## 2011-06-14 DIAGNOSIS — I4892 Unspecified atrial flutter: Secondary | ICD-10-CM

## 2011-07-01 ENCOUNTER — Ambulatory Visit: Payer: PRIVATE HEALTH INSURANCE | Admitting: Physician Assistant

## 2011-07-02 ENCOUNTER — Encounter: Payer: Self-pay | Admitting: Physician Assistant

## 2011-07-02 ENCOUNTER — Ambulatory Visit (INDEPENDENT_AMBULATORY_CARE_PROVIDER_SITE_OTHER): Payer: PRIVATE HEALTH INSURANCE | Admitting: Physician Assistant

## 2011-07-02 VITALS — BP 108/64 | HR 74 | Ht 72.0 in | Wt 153.0 lb

## 2011-07-02 DIAGNOSIS — I4892 Unspecified atrial flutter: Secondary | ICD-10-CM

## 2011-07-02 DIAGNOSIS — I2589 Other forms of chronic ischemic heart disease: Secondary | ICD-10-CM

## 2011-07-02 DIAGNOSIS — I10 Essential (primary) hypertension: Secondary | ICD-10-CM

## 2011-07-02 DIAGNOSIS — Z01810 Encounter for preprocedural cardiovascular examination: Secondary | ICD-10-CM

## 2011-07-02 DIAGNOSIS — I255 Ischemic cardiomyopathy: Secondary | ICD-10-CM

## 2011-07-02 DIAGNOSIS — I2510 Atherosclerotic heart disease of native coronary artery without angina pectoris: Secondary | ICD-10-CM

## 2011-07-02 NOTE — Patient Instructions (Signed)
OK TO HOLD COUMADIN 3 DAYS BEFORE YOUR DENTAL WORK   NO CHANGES WERE MADE TODAY

## 2011-07-02 NOTE — Progress Notes (Signed)
[address] [place], [place]  [address] Phone: [phone] Fax:  [phone]  Date:  07/02/2011   Name:  David [name]   DOB:  1940-06-08   MRN:  [id number]  PCP:  Lonia Blood, MD, MD  Primary Cardiologist:  Dr. Peter Swaziland  Primary Electrophysiologist:  None    History of Present Illness: David [name] is a 71 y.o. male who returns for surgical clearance.  He is seen for Dr. Verne Carrow.  He has a history of CAD, ischemic cardiomyopathy, paroxysmal atrial fibrillation/flutter, status post cardioversion in 2008, dyslipidemia, prostate cancer, COPD, prior alcohol abuse.  He had a cardiac catheterization done at [facility] in 2008 that demonstrated 95% stenosis in the RCA and 60% stenosis in the ramus intermediate branch.  He has been treated medically.  Last echo 06/2006: EF 20-30%, global hypokinesis, diastolic dysfunction, mild MR, mild TR.  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  He is on chronic Coumadin therapy.  Last seen by Dr. Swaziland in 03/2011.  Needs a dental procedure.  He is not sure who the dentist is that will be doing the work or what procedure.  We have no records. He actually told me he thought he was getting a colonoscopy today.    He denies a h/o stroke.  Reviewed his chart and prior CT scans without evidence of past infarct.  The patient denies chest pain, significant shortness of breath, syncope, orthopnea, PND or significant pedal edema.    Wt Readings from Last 3 Encounters:  07/02/11 153 lb (69.4 kg)  04/11/11 153 lb 6.4 oz (69.582 kg)     Potassium  Date/Time Value Range Status  10/15/2010  7:01 AM 4.2  3.5-5.1 (mEq/L) Final     Creatinine, Ser  Date/Time Value Range Status  10/15/2010  7:01 AM <0.47* 0.50-1.35 (mg/dL) Final     ALT  Date/Time Value Range Status  10/14/2010  6:37 PM 21  0-53 (U/L) Final     TSH  Date/Time Value Range Status  10/16/2010 11:55  AM 0.429  0.350-4.500 (uIU/mL) Final     Hemoglobin  Date/Time Value Range Status  10/15/2010  7:01 AM 14.0  13.0-17.0 (g/dL) Final    Past Medical History  Diagnosis Date  . Community acquired pneumonia     Right middle lobe community-acquired pneumonia  . Toxic metabolic encephalopathy   . Chronic atrial flutter   . Tobacco abuse   . History of coronary artery disease   . Ischemic cardiomyopathy   . History of prostate cancer   . Chronic obstructive pulmonary disease     with mild bronchospasm, improved  . Hypertension   . History of alcohol abuse     History of prior heavy alcohol abuse  . History of depression   . Altered mental status 2012   . Noncompliance     Current Outpatient Prescriptions  Medication Sig Dispense Refill  . albuterol (PROVENTIL HFA;VENTOLIN HFA) 108 (90 BASE) MCG/ACT inhaler Inhale 2 puffs into the lungs every 6 (six) hours as needed.      . carvedilol (COREG) 3.125 MG tablet Take 3.125 mg by mouth 2 (two) times daily with a meal.      . Dextromethorphan-Guaifenesin (ROBAFEN DM PO) Take by mouth as needed.      . DULoxetine (CYMBALTA) 60 MG capsule Take 60 mg by mouth daily.      . meloxicam (MOBIC) 7.5 MG tablet Take 7.5 mg by mouth daily.      Marland Kitchen  Mirtazapine (REMERON PO) Take by mouth daily.      . Multiple Vitamin (MULTIVITAMIN) tablet Take 1 tablet by mouth daily.      . paliperidone (INVEGA) 6 MG 24 hr tablet Take 6 mg by mouth every morning.      Marland Kitchen QUEtiapine (SEROQUEL) 50 MG tablet Take 50 mg by mouth 2 (two) times daily.      . rosuvastatin (CRESTOR) 20 MG tablet Take 20 mg by mouth daily.      Marland Kitchen senna (SENOKOT) 8.6 MG tablet Take 1 tablet by mouth daily.      Marland Kitchen tiotropium (SPIRIVA) 18 MCG inhalation capsule Place 18 mcg into inhaler and inhale daily.      Marland Kitchen topiramate (TOPAMAX) 25 MG tablet Take 25 mg by mouth daily.      Marland Kitchen warfarin (COUMADIN) 5 MG tablet Take 5 mg by mouth daily. Or as directed        Allergies: No Known  Allergies  History  Substance Use Topics  . Smoking status: Current Everyday Smoker -- 0.5 packs/day for 40 years    Types: Cigarettes  . Smokeless tobacco: Never Used  . Alcohol Use: Yes     ROS:  Please see the history of present illness.    All other systems reviewed and negative.   PHYSICAL EXAM: VS:  BP 108/64  Pulse 74  Ht 6' (1.829 m)  Wt 153 lb (69.4 kg)  BMI 20.75 kg/m2 Well nourished, well developed, in no acute distress HEENT: normal Neck: no JVD Vascular: no carotid bruits Cardiac:  normal S1, S2; RRR; no murmur Lungs:  clear to auscultation bilaterally, no wheezing, rhonchi or rales Abd: soft, nontender, no hepatomegaly Ext: no edema Skin: warm and dry Neuro:  CNs 2-12 intact, no focal abnormalities noted  EKG:  AFlutter, HR 74   ASSESSMENT AND PLAN:  1.  Coronary Artery Disease No angina.  He is not on ASA due to coumadin use.  Continue statin. No further workup needed prior to dental procedure. He should be at acceptable risk.  2.  Ischemic Cardiomyopathy Volume appears stable. He remains on beta blocker.  I am not sure why he is not on ACE inhibitor.  3.  Atrial Flutter Rate controlled. He may hold his coumadin 3 days prior to his dental procedure. I discussed his case with his cardiologist, Dr. Peter Swaziland today. I have no info from his dentist.  His dentist can contact us for my note.  4.  Hypertension Controlled.  Continue current therapy.   5.  Tobacco Abuse I advised him to quit.   Signed, Tereso Newcomer, PA-C  3:29 PM 07/02/2011

## 2011-07-26 ENCOUNTER — Ambulatory Visit (INDEPENDENT_AMBULATORY_CARE_PROVIDER_SITE_OTHER): Payer: PRIVATE HEALTH INSURANCE | Admitting: *Deleted

## 2011-07-26 DIAGNOSIS — I4892 Unspecified atrial flutter: Secondary | ICD-10-CM

## 2011-07-26 LAB — POCT INR: INR: 2.9

## 2011-07-29 ENCOUNTER — Ambulatory Visit (INDEPENDENT_AMBULATORY_CARE_PROVIDER_SITE_OTHER): Payer: PRIVATE HEALTH INSURANCE | Admitting: Endocrinology

## 2011-07-29 DIAGNOSIS — Z0289 Encounter for other administrative examinations: Secondary | ICD-10-CM

## 2011-08-01 DIAGNOSIS — E039 Hypothyroidism, unspecified: Secondary | ICD-10-CM | POA: Insufficient documentation

## 2011-08-01 NOTE — Progress Notes (Signed)
  Subjective:    Patient ID: David [name], male    DOB: [birth year], 71 y.o.   MRN: [id number]  HPI (no show)   Review of Systems     Objective:   Physical Exam        Assessment & Plan:

## 2011-08-21 ENCOUNTER — Other Ambulatory Visit (INDEPENDENT_AMBULATORY_CARE_PROVIDER_SITE_OTHER): Payer: Medicare Other

## 2011-08-21 ENCOUNTER — Ambulatory Visit (INDEPENDENT_AMBULATORY_CARE_PROVIDER_SITE_OTHER): Payer: Medicaid Other | Admitting: Endocrinology

## 2011-08-21 ENCOUNTER — Encounter: Payer: Self-pay | Admitting: Endocrinology

## 2011-08-21 VITALS — BP 110/82 | HR 72 | Temp 97.5°F | Wt 153.0 lb

## 2011-08-21 DIAGNOSIS — E079 Disorder of thyroid, unspecified: Secondary | ICD-10-CM

## 2011-08-21 LAB — T4, FREE: Free T4: 0.61 ng/dL (ref 0.60–1.60)

## 2011-08-21 LAB — TSH: TSH: 0.42 u[IU]/mL (ref 0.35–5.50)

## 2011-08-21 NOTE — Patient Instructions (Addendum)
blood tests are being requested for you today. i'll send the results to [facility], and i'll ask that the results be given to you also.

## 2011-08-21 NOTE — Progress Notes (Signed)
Subjective:    Patient ID: David [name], male    DOB: March 24, 1940, 71 y.o.   MRN: [id number]  HPI Pt was noted in 2012 to have abnormal TFT.  He says he has never been dx'ed or rx'ed for a thyroid problem in the past.  He lives at [facility]. Pt has many years of moderate hoarseness sensation in the neck, but no assoc pain.   Past Medical History  Diagnosis Date  . Community acquired pneumonia     Right middle lobe community-acquired pneumonia  . Toxic metabolic encephalopathy   . Chronic atrial flutter   . Tobacco abuse   . History of coronary artery disease   . Ischemic cardiomyopathy   . History of prostate cancer   . Chronic obstructive pulmonary disease     with mild bronchospasm, improved  . Hypertension   . History of alcohol abuse     History of prior heavy alcohol abuse  . History of depression   . Altered mental status 2012   . Noncompliance     Past Surgical History  Procedure Date  . Appendectomy     History   Social History  . Marital Status: Legally Separated    Spouse Name: N/A    Number of Children: N/A  . Years of Education: N/A   Occupational History  . Not on file.   Social History Main Topics  . Smoking status: Current Everyday Smoker -- 0.5 packs/day for 40 years    Types: Cigarettes  . Smokeless tobacco: Never Used  . Alcohol Use: Yes  . Drug Use: No  . Sexually Active: Not on file   Other Topics Concern  . Not on file   Social History Narrative  . No narrative on file    Current Outpatient Prescriptions on File Prior to Visit  Medication Sig Dispense Refill  . albuterol (PROVENTIL HFA;VENTOLIN HFA) 108 (90 BASE) MCG/ACT inhaler Inhale 2 puffs into the lungs every 6 (six) hours as needed.      . carvedilol (COREG) 3.125 MG tablet Take 3.125 mg by mouth 2 (two) times daily with a meal.      . Dextromethorphan-Guaifenesin (ROBAFEN DM PO) Take by mouth as needed.      . DULoxetine (CYMBALTA) 60 MG capsule Take 60 mg by mouth daily.       . meloxicam (MOBIC) 7.5 MG tablet Take 7.5 mg by mouth daily.      . Mirtazapine (REMERON PO) Take by mouth daily.      . Multiple Vitamin (MULTIVITAMIN) tablet Take 1 tablet by mouth daily.      . paliperidone (INVEGA) 6 MG 24 hr tablet Take 6 mg by mouth every morning.      Marland Kitchen QUEtiapine (SEROQUEL) 50 MG tablet Take 50 mg by mouth 2 (two) times daily.      . rosuvastatin (CRESTOR) 20 MG tablet Take 20 mg by mouth daily.      Marland Kitchen senna (SENOKOT) 8.6 MG tablet Take 1 tablet by mouth daily.      Marland Kitchen tiotropium (SPIRIVA) 18 MCG inhalation capsule Place 18 mcg into inhaler and inhale daily.      Marland Kitchen topiramate (TOPAMAX) 25 MG tablet Take 25 mg by mouth daily.      Marland Kitchen warfarin (COUMADIN) 5 MG tablet Take 5 mg by mouth daily. Or as directed        Allergies  Allergen Reactions  . Oat Flour (Oat)    No family history on file. No goiter  or other thyroid problem BP 110/82  Pulse 72  Temp 97.5 F (36.4 C) (Oral)  Wt 153 lb (69.4 kg)  SpO2 97%  Review of Systems denies weight loss, headache, double vision, palpitations, sob, diarrhea, polyuria, myalgias, excessive diaphoresis, numbness, tremor, anxiety, hypoglycemia, easy bruising, and rhinorrhea.      Objective:   Physical Exam VS: see vs page GEN: no distress HEAD: head: no deformity eyes: no periorbital swelling, no proptosis external nose and ears are normal mouth: no lesion seen NECK: supple, thyroid is perhaps slightly enlarged, but i can't be certain CHEST WALL: no deformity LUNGS: clear to auscultation.   CV: reg rate and rhythm, no murmur ABD: abdomen is soft, nontender.  no hepatosplenomegaly.  not distended.  no hernia MUSCULOSKELETAL: muscle bulk and strength are grossly normal.  no obvious joint swelling.  gait is normal and steady EXTEMITIES: no deformity.  no edema of the legs.   PULSES: dorsalis pedis intact bilat.  NEURO:  cn 2-12 grossly intact.   readily moves all 4's.  sensation is intact to touch on the feet.  No  tremor.  SKIN:  Normal texture and temperature.  No rash or suspicious lesion is visible.  Not diaphoretic. NODES:  None palpable at the neck.   PSYCH: alert, oriented x3.  Does not appear anxious nor depressed.    Lab Results  Component Value Date   TSH 0.42 08/21/2011      Assessment & Plan:  Hyperthyroidism, mild, uncertain etiology.  Better. Atrial flutter.  In view of this, TFT should be monitored. Hoarseness, not thyroid-related

## 2011-08-23 ENCOUNTER — Telehealth: Payer: Self-pay | Admitting: *Deleted

## 2011-08-23 NOTE — Telephone Encounter (Signed)
Pt results faxed to [facility] assisted living and mailed as well.

## 2011-09-06 ENCOUNTER — Ambulatory Visit (INDEPENDENT_AMBULATORY_CARE_PROVIDER_SITE_OTHER): Payer: PRIVATE HEALTH INSURANCE | Admitting: *Deleted

## 2011-09-06 DIAGNOSIS — I4892 Unspecified atrial flutter: Secondary | ICD-10-CM

## 2011-10-18 ENCOUNTER — Ambulatory Visit (INDEPENDENT_AMBULATORY_CARE_PROVIDER_SITE_OTHER): Payer: PRIVATE HEALTH INSURANCE | Admitting: *Deleted

## 2011-10-18 DIAGNOSIS — I4892 Unspecified atrial flutter: Secondary | ICD-10-CM

## 2011-11-29 ENCOUNTER — Ambulatory Visit (INDEPENDENT_AMBULATORY_CARE_PROVIDER_SITE_OTHER): Payer: PRIVATE HEALTH INSURANCE | Admitting: *Deleted

## 2011-11-29 DIAGNOSIS — I4892 Unspecified atrial flutter: Secondary | ICD-10-CM

## 2011-12-23 ENCOUNTER — Emergency Department (HOSPITAL_COMMUNITY): Payer: PRIVATE HEALTH INSURANCE

## 2011-12-23 ENCOUNTER — Inpatient Hospital Stay (HOSPITAL_COMMUNITY)
Admission: EM | Admit: 2011-12-23 | Discharge: 2011-12-31 | DRG: 193 | Disposition: A | Discharge status: Home / Self Care | Payer: PRIVATE HEALTH INSURANCE | Attending: Internal Medicine | Admitting: Internal Medicine

## 2011-12-23 ENCOUNTER — Encounter (HOSPITAL_COMMUNITY): Payer: Self-pay | Admitting: *Deleted

## 2011-12-23 DIAGNOSIS — E785 Hyperlipidemia, unspecified: Secondary | ICD-10-CM | POA: Diagnosis present

## 2011-12-23 DIAGNOSIS — I10 Essential (primary) hypertension: Secondary | ICD-10-CM | POA: Diagnosis present

## 2011-12-23 DIAGNOSIS — J449 Chronic obstructive pulmonary disease, unspecified: Secondary | ICD-10-CM

## 2011-12-23 DIAGNOSIS — E079 Disorder of thyroid, unspecified: Secondary | ICD-10-CM

## 2011-12-23 DIAGNOSIS — E039 Hypothyroidism, unspecified: Secondary | ICD-10-CM | POA: Diagnosis present

## 2011-12-23 DIAGNOSIS — I6509 Occlusion and stenosis of unspecified vertebral artery: Secondary | ICD-10-CM | POA: Diagnosis present

## 2011-12-23 DIAGNOSIS — R5381 Other malaise: Secondary | ICD-10-CM | POA: Diagnosis present

## 2011-12-23 DIAGNOSIS — Z720 Tobacco use: Secondary | ICD-10-CM | POA: Diagnosis present

## 2011-12-23 DIAGNOSIS — Z7901 Long term (current) use of anticoagulants: Secondary | ICD-10-CM

## 2011-12-23 DIAGNOSIS — I5022 Chronic systolic (congestive) heart failure: Secondary | ICD-10-CM | POA: Diagnosis present

## 2011-12-23 DIAGNOSIS — F17200 Nicotine dependence, unspecified, uncomplicated: Secondary | ICD-10-CM | POA: Diagnosis present

## 2011-12-23 DIAGNOSIS — J209 Acute bronchitis, unspecified: Secondary | ICD-10-CM | POA: Diagnosis present

## 2011-12-23 DIAGNOSIS — I509 Heart failure, unspecified: Secondary | ICD-10-CM | POA: Diagnosis present

## 2011-12-23 DIAGNOSIS — G9341 Metabolic encephalopathy: Secondary | ICD-10-CM | POA: Diagnosis present

## 2011-12-23 DIAGNOSIS — J441 Chronic obstructive pulmonary disease with (acute) exacerbation: Secondary | ICD-10-CM | POA: Diagnosis present

## 2011-12-23 DIAGNOSIS — I4892 Unspecified atrial flutter: Secondary | ICD-10-CM | POA: Diagnosis present

## 2011-12-23 DIAGNOSIS — R05 Cough: Secondary | ICD-10-CM

## 2011-12-23 DIAGNOSIS — I255 Ischemic cardiomyopathy: Secondary | ICD-10-CM | POA: Diagnosis present

## 2011-12-23 DIAGNOSIS — F0390 Unspecified dementia without behavioral disturbance: Secondary | ICD-10-CM | POA: Diagnosis present

## 2011-12-23 DIAGNOSIS — Z79899 Other long term (current) drug therapy: Secondary | ICD-10-CM

## 2011-12-23 DIAGNOSIS — A539 Syphilis, unspecified: Secondary | ICD-10-CM | POA: Diagnosis present

## 2011-12-23 DIAGNOSIS — J440 Chronic obstructive pulmonary disease with acute lower respiratory infection: Secondary | ICD-10-CM | POA: Diagnosis present

## 2011-12-23 DIAGNOSIS — E860 Dehydration: Secondary | ICD-10-CM | POA: Diagnosis present

## 2011-12-23 DIAGNOSIS — Z66 Do not resuscitate: Secondary | ICD-10-CM | POA: Diagnosis present

## 2011-12-23 DIAGNOSIS — R509 Fever, unspecified: Secondary | ICD-10-CM

## 2011-12-23 DIAGNOSIS — R0600 Dyspnea, unspecified: Secondary | ICD-10-CM | POA: Diagnosis present

## 2011-12-23 DIAGNOSIS — R059 Cough, unspecified: Secondary | ICD-10-CM

## 2011-12-23 DIAGNOSIS — I2589 Other forms of chronic ischemic heart disease: Secondary | ICD-10-CM | POA: Diagnosis present

## 2011-12-23 DIAGNOSIS — D696 Thrombocytopenia, unspecified: Secondary | ICD-10-CM | POA: Diagnosis present

## 2011-12-23 DIAGNOSIS — I2510 Atherosclerotic heart disease of native coronary artery without angina pectoris: Secondary | ICD-10-CM | POA: Diagnosis present

## 2011-12-23 DIAGNOSIS — J189 Pneumonia, unspecified organism: Secondary | ICD-10-CM | POA: Diagnosis present

## 2011-12-23 DIAGNOSIS — Z8546 Personal history of malignant neoplasm of prostate: Secondary | ICD-10-CM

## 2011-12-23 HISTORY — DX: Malignant neoplasm of colon, unspecified: C18.9

## 2011-12-23 LAB — URINALYSIS, ROUTINE W REFLEX MICROSCOPIC
Glucose, UA: NEGATIVE mg/dL
Ketones, ur: 15 mg/dL — AB
Leukocytes, UA: NEGATIVE
Protein, ur: 100 mg/dL — AB
pH: 5 (ref 5.0–8.0)

## 2011-12-23 LAB — COMPREHENSIVE METABOLIC PANEL
ALT: 18 U/L (ref 0–53)
Albumin: 3.7 g/dL (ref 3.5–5.2)
Alkaline Phosphatase: 63 U/L (ref 39–117)
Chloride: 100 mEq/L (ref 96–112)
GFR calc Af Amer: >90 mL/min (ref >90)
Glucose, Bld: 138 mg/dL — ABNORMAL HIGH (ref 70–99)
Potassium: 4.8 mEq/L (ref 3.5–5.1)
Sodium: 135 mEq/L (ref 135–145)
Total Bilirubin: 0.7 mg/dL (ref 0.3–1.2)
Total Protein: 7.6 g/dL (ref 6.0–8.3)

## 2011-12-23 LAB — CBC WITH DIFFERENTIAL/PLATELET
Eosinophils Relative: 1 % (ref 0–5)
HCT: 43.1 % (ref 39.0–52.0)
Lymphocytes Relative: 13 % (ref 12–46)
Lymphs Abs: 1.2 10*3/uL (ref 0.7–4.0)
MCV: 92.9 fL (ref 78.0–100.0)
Monocytes Absolute: 1.2 10*3/uL — ABNORMAL HIGH (ref 0.1–1.0)
Platelets: 137 10*3/uL — ABNORMAL LOW (ref 150–400)
RBC: 4.64 MIL/uL (ref 4.22–5.81)
WBC: 9.5 10*3/uL (ref 4.0–10.5)

## 2011-12-23 LAB — PROTIME-INR
INR: 1.43 (ref 0.00–1.49)
Prothrombin Time: 17.1 seconds — ABNORMAL HIGH (ref 11.6–15.2)

## 2011-12-23 LAB — CG4 I-STAT (LACTIC ACID): Lactic Acid, Venous: 1.55 mmol/L (ref 0.5–2.2)

## 2011-12-23 LAB — URINE MICROSCOPIC-ADD ON

## 2011-12-23 MED ORDER — SODIUM CHLORIDE 0.9 % IV BOLUS (SEPSIS)
2000.0000 mL | Freq: Once | INTRAVENOUS | Status: AC
  Administered 2011-12-23: 1000 mL via INTRAVENOUS

## 2011-12-23 MED ORDER — PIPERACILLIN-TAZOBACTAM 3.375 G IVPB: 3.3750 g | Freq: Once | INTRAVENOUS | Status: DC

## 2011-12-23 MED ORDER — VANCOMYCIN HCL IN DEXTROSE 1-5 GM/200ML-% IV SOLN
1000.0000 mg | Freq: Once | INTRAVENOUS | Status: AC
  Administered 2011-12-24: 1000 mg via INTRAVENOUS

## 2011-12-23 MED ORDER — ACETAMINOPHEN 325 MG PO TABS
650.0000 mg | ORAL_TABLET | Freq: Once | ORAL | Status: AC
  Administered 2011-12-23: 650 mg via ORAL
  Filled 2011-12-23: qty 2

## 2011-12-23 NOTE — ED Notes (Signed)
IV attempted by this RN x2 and Melody RN x1 unsuccessful. IV team paged.

## 2011-12-23 NOTE — ED Notes (Signed)
IV team x2 unsuccessful attempts

## 2011-12-23 NOTE — ED Provider Notes (Addendum)
History     CSN: [id number]  Arrival date & time 12/23/11  1512   First MD Initiated Contact with Patient 12/23/11 2001      Chief Complaint  Patient presents with  . Back Pain  . Groin Pain   Level V caveat the patient patient confused (Consider location/radiation/quality/duration/timing/severity/associated sxs/prior treatment) HPI History is obtained from records accompanying patient and from Mr.Humarr, Merchandiser, retail at [facility] assisted living facility. Patient reportedly complained of low back pain and numbness in his groin for an unknown period of time. Presently patient denies pain anywhere. No treatment prior to coming here. Mr.Humarr claims that patient is normally not confused, normally only speaks slowly.  Past Medical History  Diagnosis Date  . Community acquired pneumonia     Right middle lobe community-acquired pneumonia  . Toxic metabolic encephalopathy   . Chronic atrial flutter   . Tobacco abuse   . History of coronary artery disease   . Ischemic cardiomyopathy   . History of prostate cancer   . Chronic obstructive pulmonary disease     with mild bronchospasm, improved  . Hypertension   . History of alcohol abuse     History of prior heavy alcohol abuse  . History of depression   . Altered mental status 2012   . Noncompliance   . Depression   . Ulcer   . Hx of migraines   . Hyperlipidemia     Past Surgical History  Procedure Date  . Appendectomy     Family History  Problem Relation Age of Onset  . Cancer Father     Prostate Cancer  . Heart disease Other     Parent  . Alcohol abuse Other     Parent  . Arthritis Other     parent  . Hypertension Other     Parent    History  Substance Use Topics  . Smoking status: Current Every Day Smoker -- 0.5 packs/day for 40 years    Types: Cigarettes  . Smokeless tobacco: Never Used  . Alcohol Use: Yes      Review of Systems  Unable to perform ROS: Mental status change    Allergies  Oat  flour  Home Medications   Current Outpatient Rx  Name  Route  Sig  Dispense  Refill  . ALBUTEROL SULFATE HFA 108 (90 BASE) MCG/ACT IN AERS   Inhalation   Inhale 2 puffs into the lungs every 6 (six) hours as needed.         Marland Kitchen CARVEDILOL 3.125 MG PO TABS   Oral   Take 3.125 mg by mouth 2 (two) times daily with a meal.         . DULOXETINE HCL 60 MG PO CPEP   Oral   Take 60 mg by mouth daily.         Marland Kitchen HYDROCODONE-ACETAMINOPHEN 5-325 MG PO TABS   Oral   Take 1 tablet by mouth 4 (four) times daily as needed. pain         . MELOXICAM 7.5 MG PO TABS   Oral   Take 7.5 mg by mouth daily.         Marland Kitchen MIRTAZAPINE 15 MG PO TABS   Oral   Take 15 mg by mouth at bedtime.         Marland Kitchen ONE-DAILY MULTI VITAMINS PO TABS   Oral   Take 1 tablet by mouth daily.         Marland Kitchen PALIPERIDONE ER 6 MG  PO TB24   Oral   Take 6 mg by mouth every morning.         Marland Kitchen QUETIAPINE FUMARATE 50 MG PO TABS   Oral   Take 50 mg by mouth 2 (two) times daily.         Marland Kitchen QVAR 40 MCG/ACT IN AERS   Oral   Take 2 puffs by mouth Twice daily.         Marland Kitchen ROSUVASTATIN CALCIUM 20 MG PO TABS   Oral   Take 20 mg by mouth daily.         . SENNOSIDES 8.6 MG PO TABS   Oral   Take 1 tablet by mouth at bedtime.          . TOPIRAMATE 25 MG PO TABS   Oral   Take 25 mg by mouth at bedtime.          . WARFARIN SODIUM 5 MG PO TABS   Oral   Take 5 mg by mouth daily. Or as directed 7.5mg  on Sundays           BP 151/88  Pulse 155  Temp 101.1 F (38.4 C) (Rectal)  Resp 38  SpO2 92%  Physical Exam  Nursing note and vitals reviewed. Constitutional:       Chronically ill appearing  HENT:  Head: Normocephalic and atraumatic.       Mucous membranes dry poor dentition  Eyes: Conjunctivae normal are normal. Pupils are equal, round, and reactive to light.  Neck: Neck supple. No tracheal deviation present. No thyromegaly present.  Cardiovascular:  No murmur heard.      Tachycardic    Pulmonary/Chest: Breath sounds normal.       Diffuse rhonchi, coughing frequently  Abdominal: Soft. Bowel sounds are normal. He exhibits no distension. There is no tenderness.  Genitourinary: Rectum normal, prostate normal and penis normal.  Musculoskeletal: Normal range of motion. He exhibits no edema and no tenderness.       Entire spine nontender  Neurological: He is alert. Coordination normal.       Oriented to name and hospital does not know month moves all extremities follows simple commands  Skin: Skin is warm and dry. No rash noted.  Psychiatric: He has a normal mood and affect.    ED Course  Procedures (including critical care time)   Labs Reviewed  COMPREHENSIVE METABOLIC PANEL  CBC WITH DIFFERENTIAL  URINALYSIS, ROUTINE W REFLEX MICROSCOPIC  URINE CULTURE  CULTURE, BLOOD (ROUTINE X 2)  CULTURE, BLOOD (ROUTINE X 2)  PROTIME-INR   Dg Chest 2 View  12/23/2011  *RADIOLOGY REPORT*  Clinical Data: Back pain, groin pain, cough, chills, fever  CHEST - 2 VIEW  Comparison: 04/11/2011  Findings: Cardiomediastinal silhouette is stable.  No acute infiltrate or pleural effusion.  No pulmonary edema.  Bony thorax is stable.  IMPRESSION: No active disease.   Original Report Authenticated By: Natasha Mead, M.D.     Date: 12/23/2011  Rate: 130  Rhythm: atrial flutter  QRS Axis: normal  Intervals: normal  ST/T Wave abnormalities: normal  Conduction Disutrbances:none  Narrative Interpretation:   Old EKG Reviewed: Tracing from 07/02/2011 showed atrial flutter rate controlled at 75 beats per minute otherwise unchanged from today's tracing interpreted by me   No diagnosis found. Results for orders placed during the hospital encounter of 12/23/11  COMPREHENSIVE METABOLIC PANEL      Component Value Range   Sodium 135  135 - 145 mEq/L   Potassium  4.8  3.5 - 5.1 mEq/L   Chloride 100  96 - 112 mEq/L   CO2 21  19 - 32 mEq/L   Glucose, Bld 138 (*) 70 - 99 mg/dL   BUN 14  6 - 23 mg/dL    Creatinine, Ser 1.61  0.50 - 1.35 mg/dL   Calcium 9.5  8.4 - 09.6 mg/dL   Total Protein 7.6  6.0 - 8.3 g/dL   Albumin 3.7  3.5 - 5.2 g/dL   AST 34  0 - 37 U/L   ALT 18  0 - 53 U/L   Alkaline Phosphatase 63  39 - 117 U/L   Total Bilirubin 0.7  0.3 - 1.2 mg/dL   GFR calc non Af Amer >90  >90 mL/min   GFR calc Af Amer >90  >90 mL/min  URINALYSIS, ROUTINE W REFLEX MICROSCOPIC      Component Value Range   Color, Urine YELLOW  YELLOW   APPearance CLOUDY (*) CLEAR   Specific Gravity, Urine 1.032 (*) 1.005 - 1.030   pH 5.0  5.0 - 8.0   Glucose, UA NEGATIVE  NEGATIVE mg/dL   Hgb urine dipstick SMALL (*) NEGATIVE   Bilirubin Urine SMALL (*) NEGATIVE   Ketones, ur 15 (*) NEGATIVE mg/dL   Protein, ur 045 (*) NEGATIVE mg/dL   Urobilinogen, UA 2.0 (*) 0.0 - 1.0 mg/dL   Nitrite NEGATIVE  NEGATIVE   Leukocytes, UA NEGATIVE  NEGATIVE  PROTIME-INR      Component Value Range   Prothrombin Time 17.1 (*) 11.6 - 15.2 seconds   INR 1.43  0.00 - 1.49  OCCULT BLOOD, POC DEVICE      Component Value Range   Fecal Occult Bld NEGATIVE    URINE MICROSCOPIC-ADD ON      Component Value Range   Squamous Epithelial / LPF RARE  RARE   WBC, UA 0-2  <3 WBC/hpf   RBC / HPF 3-6  <3 RBC/hpf   Bacteria, UA RARE  RARE   Urine-Other AMORPHOUS URATES/PHOSPHATES    CBC WITH DIFFERENTIAL      Component Value Range   WBC 9.5  4.0 - 10.5 K/uL   RBC 4.64  4.22 - 5.81 MIL/uL   Hemoglobin 15.1  13.0 - 17.0 g/dL   HCT 40.9  81.1 - 91.4 %   MCV 92.9  78.0 - 100.0 fL   MCH 32.5  26.0 - 34.0 pg   MCHC 35.0  30.0 - 36.0 g/dL   RDW 78.2  95.6 - 21.3 %   Platelets 137 (*) 150 - 400 K/uL   Neutrophils Relative 74  43 - 77 %   Neutro Abs 7.0  1.7 - 7.7 K/uL   Lymphocytes Relative 13  12 - 46 %   Lymphs Abs 1.2  0.7 - 4.0 K/uL   Monocytes Relative 13 (*) 3 - 12 %   Monocytes Absolute 1.2 (*) 0.1 - 1.0 K/uL   Eosinophils Relative 1  0 - 5 %   Eosinophils Absolute 0.1  0.0 - 0.7 K/uL   Basophils Relative 0  0 - 1 %    Basophils Absolute 0.0  0.0 - 0.1 K/uL  CG4 I-STAT (LACTIC ACID)      Component Value Range   Lactic Acid, Venous 1.55  0.5 - 2.2 mmol/L   Dg Chest 2 View  12/23/2011  *RADIOLOGY REPORT*  Clinical Data: Back pain, groin pain, cough, chills, fever  CHEST - 2 VIEW  Comparison: 04/11/2011  Findings: Cardiomediastinal silhouette  is stable.  No acute infiltrate or pleural effusion.  No pulmonary edema.  Bony thorax is stable.  IMPRESSION: No active disease.   Original Report Authenticated By: Natasha Mead, M.D.      Spoke with hospitalist physician Dr. Joneen Roach who will arrange for 23 hour observation to step down unit MDM  Clinically patient with pneumonia i.e. cough, fever tachypnea and abnormal lung exam In light of institutionalization and confusion treated initially for undifferentiated sepsis Dr Esmeralda Arthur to order tamiflu Diagnosis #1 febrile illness differential diagnosis includes pneumonia versus viral etiology #hyperglycemia         Doug Sou, MD 12/24/11 0040  Doug Sou, MD 12/24/11 [phone]

## 2011-12-23 NOTE — ED Notes (Signed)
Pt HR in 130-150's pt. HR unable to decrease HR with vagal maneuver. Pt respiratory rate @30bpm  O2 sats @95 %/RA Pt. Put on 2L for comfort. MD made aware.

## 2011-12-23 NOTE — ED Notes (Signed)
Taken of 2L per MD

## 2011-12-23 NOTE — ED Notes (Addendum)
Pt in c/o back problems and pain and numbness in groin x2 days, also thyroid problems but is unable to verbalize what he means by thyroid problems, pain with urination. Pt also noted to be coughing.

## 2011-12-24 ENCOUNTER — Observation Stay (HOSPITAL_COMMUNITY): Payer: PRIVATE HEALTH INSURANCE

## 2011-12-24 ENCOUNTER — Encounter (HOSPITAL_COMMUNITY): Payer: Self-pay | Admitting: *Deleted

## 2011-12-24 DIAGNOSIS — R0600 Dyspnea, unspecified: Secondary | ICD-10-CM | POA: Diagnosis present

## 2011-12-24 DIAGNOSIS — D696 Thrombocytopenia, unspecified: Secondary | ICD-10-CM | POA: Diagnosis present

## 2011-12-24 DIAGNOSIS — R509 Fever, unspecified: Secondary | ICD-10-CM

## 2011-12-24 DIAGNOSIS — G9341 Metabolic encephalopathy: Secondary | ICD-10-CM | POA: Diagnosis present

## 2011-12-24 DIAGNOSIS — E860 Dehydration: Secondary | ICD-10-CM | POA: Diagnosis present

## 2011-12-24 DIAGNOSIS — I5022 Chronic systolic (congestive) heart failure: Secondary | ICD-10-CM | POA: Diagnosis present

## 2011-12-24 DIAGNOSIS — J439 Emphysema, unspecified: Secondary | ICD-10-CM | POA: Insufficient documentation

## 2011-12-24 DIAGNOSIS — J189 Pneumonia, unspecified organism: Secondary | ICD-10-CM | POA: Diagnosis present

## 2011-12-24 DIAGNOSIS — Z7901 Long term (current) use of anticoagulants: Secondary | ICD-10-CM

## 2011-12-24 LAB — COMPREHENSIVE METABOLIC PANEL
ALT: 18 U/L (ref 0–53)
Alkaline Phosphatase: 56 U/L (ref 39–117)
CO2: 26 mEq/L (ref 19–32)
Calcium: 8.9 mg/dL (ref 8.4–10.5)
Chloride: 103 mEq/L (ref 96–112)
GFR calc Af Amer: >90 mL/min (ref >90)
GFR calc non Af Amer: >90 mL/min (ref >90)
Glucose, Bld: 167 mg/dL — ABNORMAL HIGH (ref 70–99)
Potassium: 4 mEq/L (ref 3.5–5.1)
Sodium: 137 mEq/L (ref 135–145)
Total Bilirubin: 0.6 mg/dL (ref 0.3–1.2)

## 2011-12-24 LAB — GLUCOSE, CAPILLARY: Glucose-Capillary: 149 mg/dL — ABNORMAL HIGH (ref 70–99)

## 2011-12-24 LAB — BASIC METABOLIC PANEL
CO2: 27 mEq/L (ref 19–32)
Calcium: 8.5 mg/dL (ref 8.4–10.5)
Glucose, Bld: 110 mg/dL — ABNORMAL HIGH (ref 70–99)
Sodium: 138 mEq/L (ref 135–145)

## 2011-12-24 LAB — INFLUENZA PANEL BY PCR (TYPE A & B)
H1N1 flu by pcr: NOT DETECTED
Influenza A By PCR: NEGATIVE
Influenza B By PCR: NEGATIVE

## 2011-12-24 LAB — CBC
HCT: 41 % (ref 39.0–52.0)
Hemoglobin: 13.9 g/dL (ref 13.0–17.0)
MCH: 31.8 pg (ref 26.0–34.0)
RBC: 4.37 MIL/uL (ref 4.22–5.81)

## 2011-12-24 MED ORDER — PALIPERIDONE ER 6 MG PO TB24
6.0000 mg | ORAL_TABLET | Freq: Every day | ORAL | Status: DC
  Administered 2011-12-24 – 2011-12-31 (×8): 6 mg via ORAL
  Filled 2011-12-24 (×9): qty 1

## 2011-12-24 MED ORDER — QUETIAPINE FUMARATE 50 MG PO TABS
50.0000 mg | ORAL_TABLET | Freq: Two times a day (BID) | ORAL | Status: DC
  Administered 2011-12-24 – 2011-12-31 (×15): 50 mg via ORAL
  Filled 2011-12-24 (×16): qty 1

## 2011-12-24 MED ORDER — SODIUM CHLORIDE 0.9 % IV SOLN
INTRAVENOUS | Status: DC
  Administered 2011-12-24: 04:00:00 via INTRAVENOUS
  Administered 2011-12-25: 10 mL/h via INTRAVENOUS

## 2011-12-24 MED ORDER — METHYLPREDNISOLONE SODIUM SUCC 125 MG IJ SOLR
60.0000 mg | Freq: Two times a day (BID) | INTRAMUSCULAR | Status: DC
  Administered 2011-12-24: 60 mg via INTRAVENOUS
  Filled 2011-12-24 (×2): qty 0.96

## 2011-12-24 MED ORDER — ENOXAPARIN SODIUM 40 MG/0.4ML ~~LOC~~ SOLN
40.0000 mg | Freq: Every day | SUBCUTANEOUS | Status: DC
  Administered 2011-12-24 – 2011-12-25 (×2): 40 mg via SUBCUTANEOUS
  Filled 2011-12-24 (×2): qty 0.4

## 2011-12-24 MED ORDER — OSELTAMIVIR PHOSPHATE 75 MG PO CAPS
75.0000 mg | ORAL_CAPSULE | Freq: Once | ORAL | Status: AC
  Administered 2011-12-24: 75 mg via ORAL
  Filled 2011-12-24: qty 1

## 2011-12-24 MED ORDER — VANCOMYCIN HCL 1000 MG IV SOLR
750.0000 mg | Freq: Two times a day (BID) | INTRAVENOUS | Status: DC
  Administered 2011-12-24 – 2011-12-25 (×2): 750 mg via INTRAVENOUS
  Filled 2011-12-24 (×4): qty 750

## 2011-12-24 MED ORDER — GUAIFENESIN ER 600 MG PO TB12
600.0000 mg | ORAL_TABLET | Freq: Two times a day (BID) | ORAL | Status: DC
  Administered 2011-12-24 – 2011-12-31 (×14): 600 mg via ORAL
  Filled 2011-12-24 (×16): qty 1

## 2011-12-24 MED ORDER — WARFARIN SODIUM 5 MG PO TABS
5.0000 mg | ORAL_TABLET | ORAL | Status: DC
  Filled 2011-12-24: qty 1

## 2011-12-24 MED ORDER — ONDANSETRON HCL 4 MG PO TABS: 4.0000 mg | ORAL_TABLET | Freq: Four times a day (QID) | ORAL | Status: DC | PRN

## 2011-12-24 MED ORDER — ACETAMINOPHEN 325 MG PO TABS
650.0000 mg | ORAL_TABLET | Freq: Four times a day (QID) | ORAL | Status: DC | PRN
  Filled 2011-12-24: qty 2

## 2011-12-24 MED ORDER — METOPROLOL TARTRATE 1 MG/ML IV SOLN
5.0000 mg | Freq: Four times a day (QID) | INTRAVENOUS | Status: DC
  Administered 2011-12-24: 5 mg via INTRAVENOUS
  Filled 2011-12-24: qty 5

## 2011-12-24 MED ORDER — CARVEDILOL 3.125 MG PO TABS
3.1250 mg | ORAL_TABLET | Freq: Two times a day (BID) | ORAL | Status: DC
  Administered 2011-12-25 (×2): 3.125 mg via ORAL
  Filled 2011-12-24 (×3): qty 1

## 2011-12-24 MED ORDER — ACETAMINOPHEN 650 MG RE SUPP: 650.0000 mg | Freq: Four times a day (QID) | RECTAL | Status: DC | PRN

## 2011-12-24 MED ORDER — IPRATROPIUM BROMIDE 0.02 % IN SOLN: 0.5000 mg | RESPIRATORY_TRACT | Status: DC | PRN

## 2011-12-24 MED ORDER — PIPERACILLIN-TAZOBACTAM 3.375 G IVPB
3.3750 g | Freq: Three times a day (TID) | INTRAVENOUS | Status: DC
  Administered 2011-12-24 – 2011-12-25 (×5): 3.375 g via INTRAVENOUS
  Filled 2011-12-24 (×7): qty 50

## 2011-12-24 MED ORDER — WARFARIN SODIUM 7.5 MG PO TABS: 7.5000 mg | ORAL_TABLET | ORAL | Status: DC

## 2011-12-24 MED ORDER — INFLUENZA VIRUS VACC SPLIT PF IM SUSP
0.5000 mL | INTRAMUSCULAR | Status: AC
  Filled 2011-12-24: qty 0.5

## 2011-12-24 MED ORDER — WARFARIN - PHYSICIAN DOSING INPATIENT: Freq: Every day | Status: DC

## 2011-12-24 MED ORDER — DULOXETINE HCL 60 MG PO CPEP
60.0000 mg | ORAL_CAPSULE | Freq: Every day | ORAL | Status: DC
  Administered 2011-12-24 – 2011-12-31 (×7): 60 mg via ORAL
  Filled 2011-12-24 (×8): qty 1

## 2011-12-24 MED ORDER — WARFARIN SODIUM 7.5 MG PO TABS
7.5000 mg | ORAL_TABLET | Freq: Once | ORAL | Status: DC
  Filled 2011-12-24: qty 1

## 2011-12-24 MED ORDER — WARFARIN SODIUM 7.5 MG PO TABS
7.5000 mg | ORAL_TABLET | Freq: Once | ORAL | Status: AC
  Administered 2011-12-24: 7.5 mg via ORAL
  Filled 2011-12-24: qty 1

## 2011-12-24 MED ORDER — SODIUM CHLORIDE 0.9 % IJ SOLN: 3.0000 mL | INTRAMUSCULAR | Status: DC | PRN

## 2011-12-24 MED ORDER — IOHEXOL 300 MG/ML  SOLN
75.0000 mL | Freq: Once | INTRAMUSCULAR | Status: AC | PRN
  Administered 2011-12-24: 75 mL via INTRAVENOUS

## 2011-12-24 MED ORDER — WARFARIN - PHARMACIST DOSING INPATIENT: Freq: Every day | Status: DC

## 2011-12-24 MED ORDER — TOPIRAMATE 25 MG PO TABS
25.0000 mg | ORAL_TABLET | Freq: Every day | ORAL | Status: DC
  Administered 2011-12-24 – 2011-12-30 (×7): 25 mg via ORAL
  Filled 2011-12-24 (×8): qty 1

## 2011-12-24 MED ORDER — ALBUTEROL SULFATE (5 MG/ML) 0.5% IN NEBU: 2.5000 mg | INHALATION_SOLUTION | RESPIRATORY_TRACT | Status: DC | PRN

## 2011-12-24 MED ORDER — NICOTINE 14 MG/24HR TD PT24
14.0000 mg | MEDICATED_PATCH | Freq: Every day | TRANSDERMAL | Status: DC
  Administered 2011-12-24: 14 mg via TRANSDERMAL
  Filled 2011-12-24: qty 1

## 2011-12-24 MED ORDER — ALUM & MAG HYDROXIDE-SIMETH 200-200-20 MG/5ML PO SUSP
30.0000 mL | Freq: Four times a day (QID) | ORAL | Status: DC | PRN
  Filled 2011-12-24: qty 30

## 2011-12-24 MED ORDER — SODIUM CHLORIDE 0.9 % IJ SOLN
3.0000 mL | Freq: Two times a day (BID) | INTRAMUSCULAR | Status: DC
  Administered 2011-12-24 – 2011-12-30 (×11): 3 mL via INTRAVENOUS

## 2011-12-24 MED ORDER — ALBUTEROL SULFATE (5 MG/ML) 0.5% IN NEBU
5.0000 mg | INHALATION_SOLUTION | Freq: Once | RESPIRATORY_TRACT | Status: AC
  Administered 2011-12-24: 5 mg via RESPIRATORY_TRACT
  Filled 2011-12-24: qty 1

## 2011-12-24 MED ORDER — PNEUMOCOCCAL VAC POLYVALENT 25 MCG/0.5ML IJ INJ
0.5000 mL | INJECTION | INTRAMUSCULAR | Status: AC
  Filled 2011-12-24: qty 0.5

## 2011-12-24 MED ORDER — MIRTAZAPINE 15 MG PO TABS
15.0000 mg | ORAL_TABLET | Freq: Every day | ORAL | Status: DC
  Administered 2011-12-24 – 2011-12-30 (×7): 15 mg via ORAL
  Filled 2011-12-24 (×8): qty 1

## 2011-12-24 MED ORDER — ONDANSETRON HCL 4 MG/2ML IJ SOLN: 4.0000 mg | Freq: Four times a day (QID) | INTRAMUSCULAR | Status: DC | PRN

## 2011-12-24 MED ORDER — LEVOFLOXACIN IN D5W 750 MG/150ML IV SOLN
750.0000 mg | INTRAVENOUS | Status: DC
  Administered 2011-12-24 – 2011-12-26 (×2): 750 mg via INTRAVENOUS
  Filled 2011-12-24 (×5): qty 150

## 2011-12-24 NOTE — Progress Notes (Signed)
ANTIBIOTIC CONSULT NOTE - INITIAL  Pharmacy Consult for vancomycin + levaquin Indication: pneumonia  Allergies  Allergen Reactions  . Oat Flour (Oat)     Patient Measurements: Height: 6' (182.9 cm) Weight: 141 lb 15.6 oz (64.4 kg) IBW/kg (Calculated) : 77.6   Vital Signs: Temp: 97.8 F (36.6 C) (12/03 0756) Temp src: Oral (12/03 0756) BP: 99/81 mmHg (12/03 1100) Pulse Rate: 89  (12/03 1000) Intake/Output from previous day: 12/02 0701 - 12/03 0700 In: 239.8 [I.V.:2.3; IV Piggyback:237.5] Out: 300 [Urine:300] Intake/Output from this shift: Total I/O In: 172.5 [I.V.:160; IV Piggyback:12.5] Out: 400 [Urine:400]  Labs:  [facility] 12/24/11 0523 12/23/11 2240 12/23/11 2149  WBC 8.2 9.5 --  HGB 13.9 15.1 --  PLT 134* 137* --  LABCREA -- -- --  CREATININE 0.54 -- 0.66   Estimated Creatinine Clearance: 77.1 ml/min (by C-G formula based on Cr of 0.54). No results found for this basename: VANCOTROUGH:2,VANCOPEAK:2,VANCORANDOM:2,GENTTROUGH:2,GENTPEAK:2,GENTRANDOM:2,TOBRATROUGH:2,TOBRAPEAK:2,TOBRARND:2,AMIKACINPEAK:2,AMIKACINTROU:2,AMIKACIN:2, in the last 72 hours   Microbiology: Recent Results (from the past 720 hour(s))  MRSA PCR SCREENING     Status: Normal   Collection Time   12/24/11  3:00 AM      Component Value Range Status Comment   MRSA by PCR NEGATIVE  NEGATIVE Final     Medical History: Past Medical History  Diagnosis Date  . Community acquired pneumonia     Right middle lobe community-acquired pneumonia  . Toxic metabolic encephalopathy   . Chronic atrial flutter   . Tobacco abuse   . History of coronary artery disease   . Ischemic cardiomyopathy   . History of prostate cancer   . Chronic obstructive pulmonary disease     with mild bronchospasm, improved  . Hypertension   . History of alcohol abuse     History of prior heavy alcohol abuse  . History of depression   . Altered mental status 2012   . Noncompliance   . Depression   . Ulcer   . Hx of  migraines   . Hyperlipidemia     Medications:  Anti-infectives     Start     Dose/Rate Route Frequency Ordered Stop   12/24/11 1400   vancomycin (VANCOCIN) 750 mg in sodium chloride 0.9 % 150 mL IVPB        750 mg 150 mL/hr over 60 Minutes Intravenous Every 12 hours 12/24/11 1130     12/24/11 1200   levofloxacin (LEVAQUIN) IVPB 750 mg        750 mg 100 mL/hr over 90 Minutes Intravenous Every 24 hours 12/24/11 1130     12/24/11 0400  piperacillin-tazobactam (ZOSYN) IVPB 3.375 g       3.375 g 12.5 mL/hr over 240 Minutes Intravenous Every 8 hours 12/24/11 0323     12/24/11 0045   oseltamivir (TAMIFLU) capsule 75 mg        75 mg Oral  Once 12/24/11 0037 12/24/11 0225   12/24/11 0000   vancomycin (VANCOCIN) IVPB 1000 mg/200 mL premix        1,000 mg 200 mL/hr over 60 Minutes Intravenous  Once 12/23/11 2357 12/24/11 0301   12/24/11 0000   piperacillin-tazobactam (ZOSYN) IVPB 3.375 g  Status:  Discontinued        3.375 g 12.5 mL/hr over 240 Minutes Intravenous  Once 12/23/11 2357 12/24/11 0323         Assessment: 71 yom presented to the hospital with SOB. He was initially started on zosyn and also received a 1x dose vancomycin this AM.  Now to continue vanc and also add levaquin. Tmax is 101.1 and WBC is WNL. Cultures are pending.   Goal of Therapy:  Vancomycin trough level 15-20 mcg/ml  Plan:  1. Levaquin 750mg  IV Q24H 2. Vancomycin 750mg  IV Q12H 3. Continue zosyn 3.375gm IV Q8H as ordered by MD 4. F/u renal fxn, C&S, clinical status and trough at SS 5. Please consider monitoring QTc as pt is on multiple drugs that can prolong the QTc (i.e. Levaquin, invega, quetiapine)  David [name], David [name] 12/24/2011,11:39 AM

## 2011-12-24 NOTE — Procedures (Signed)
Objective Swallowing Evaluation: Modified Barium Swallowing Study  Patient Details  Name: David [name] MRN: [id number] Date of Birth: 08-Sep-1940  Today's Date: 12/24/2011 Time: 1330-1400 SLP Time Calculation (min): 30 min  Past Medical History:  Past Medical History  Diagnosis Date  . Community acquired pneumonia     Right middle lobe community-acquired pneumonia  . Toxic metabolic encephalopathy   . Chronic atrial flutter   . Tobacco abuse   . History of coronary artery disease   . Ischemic cardiomyopathy   . History of prostate cancer   . Chronic obstructive pulmonary disease     with mild bronchospasm, improved  . Hypertension   . History of alcohol abuse     History of prior heavy alcohol abuse  . History of depression   . Altered mental status 2012   . Noncompliance   . Depression   . Ulcer   . Hx of migraines   . Hyperlipidemia    Past Surgical History:  Past Surgical History  Procedure Date  . Appendectomy    HPI:  This is a 71 year old male that had been asked to admit for pneumonia. Patient is awake alert, however, unable to get any detailed information as patient's short of breath and difficult to understand. Currently patient not very hypoxic, with a normal chest x-ray the patient is wheezing and quite rhonchorous. Patient lives in a independent living facility. He does smoke and he does have COPD. 2007 Cervical spine shows cervical osteophytes.      Assessment / Plan / Recommendation Clinical Impression  Dysphagia Diagnosis: Mild pharyngeal phase dysphagia Clinical impression: Pt presents with a mild pharyngeal phase dysphagia with trace silent penetration of all consistencies into the laryngeal vestibule but not touching or passing the cords. Pt with a slight dleay in initiation and mild weakness of the hyolaryngeal mechanism. This results in decreased closure of the airway during the swallow with trace pnetration, particularly with puree consistency. Do not  feel that this very mild dysphagia is a contributor to pts respiratory impairement. However do feel pt would benefit from compensatory strategies and f/u therapy to reduce penetration of liquids and solids. A chin tuck and intermittent throat clear would reduce risk of aspiration. Recommend pt downgrade to a dys 3 (mechanical soft diet) with thin liquids and strategies and precautions listed below.     Treatment Recommendation  Therapy as outlined in treatment plan below    Diet Recommendation Dysphagia 3 (Mechanical Soft);Thin liquid   Liquid Administration via: Cup;Straw Medication Administration: Crushed with puree Supervision: Patient able to self feed;Full supervision/cueing for compensatory strategies Compensations: Slow rate;Small sips/bites;Clear throat intermittently Postural Changes and/or Swallow Maneuvers: Seated upright 90 degrees;Chin tuck;Upright 30-60 min after meal    Other  Recommendations Oral Care Recommendations: Oral care BID   Follow Up Recommendations  Skilled Nursing facility    Frequency and Duration min 2x/week  2 weeks   Pertinent Vitals/Pain NA    SLP Swallow Goals Patient will consume recommended diet without observed clinical signs of aspiration with: Supervision/safety Patient will utilize recommended strategies during swallow to increase swallowing safety with: Minimal cueing Goal #3: Pt will complete pharyngeal strengthening exercises x10 with min verbal cues from SLP   General HPI: This is a 71 year old male that had been asked to admit for pneumonia. Patient is awake alert, however, unable to get any detailed information as patient's short of breath and difficult to understand. Currently patient not very hypoxic, with a normal chest x-ray the patient is wheezing  and quite rhonchorous. Patient lives in a independent living facility. He does smoke and he does have COPD. 2007 Cervical spine shows cervical osteophytes.  Type of Study: Modified Barium  Swallowing Study Temperature Spikes Noted: No Behavior/Cognition: Alert;Confused Oral Cavity - Dentition: Poor condition;Missing dentition;Edentulous Self-Feeding Abilities: Able to feed self Patient Positioning: Upright in chair Baseline Vocal Quality: Clear Volitional Cough: Congested Volitional Swallow: Able to elicit Anatomy: Within functional limits (Osteophytes do not impact function) Pharyngeal Secretions: Not observed secondary MBS    Reason for Referral     Oral Phase Oral Preparation/Oral Phase Oral Phase: WFL   Pharyngeal Phase Pharyngeal Phase Pharyngeal Phase: Impaired Pharyngeal - Thin Pharyngeal - Thin Cup: Premature spillage to pyriform sinuses;Penetration/Aspiration during swallow;Reduced airway/laryngeal closure;Reduced anterior laryngeal mobility;Reduced epiglottic inversion;Reduced laryngeal elevation Penetration/Aspiration details (thin cup): Material enters airway, remains ABOVE vocal cords and not ejected out;Material enters airway, remains ABOVE vocal cords then ejected out;Material does not enter airway Pharyngeal - Thin Straw: Premature spillage to pyriform sinuses;Penetration/Aspiration during swallow;Reduced airway/laryngeal closure;Reduced anterior laryngeal mobility;Reduced epiglottic inversion;Reduced laryngeal elevation Penetration/Aspiration details (thin straw): Material enters airway, remains ABOVE vocal cords and not ejected out;Material enters airway, remains ABOVE vocal cords then ejected out;Material does not enter airway Pharyngeal - Solids Pharyngeal - Puree: Premature spillage to pyriform sinuses;Penetration/Aspiration during swallow;Reduced airway/laryngeal closure;Reduced anterior laryngeal mobility;Reduced epiglottic inversion;Reduced laryngeal elevation;Pharyngeal residue - pyriform sinuses Penetration/Aspiration details (puree): Material enters airway, remains ABOVE vocal cords and not ejected out Pharyngeal - Mechanical Soft: Premature  spillage to pyriform sinuses;Penetration/Aspiration during swallow;Reduced airway/laryngeal closure;Reduced anterior laryngeal mobility;Reduced epiglottic inversion;Reduced laryngeal elevation;Pharyngeal residue - pyriform sinuses Penetration/Aspiration details (mechanical soft): Material enters airway, remains ABOVE vocal cords and not ejected out  Cervical Esophageal Phase    GO             Harlon Ditty, MA CCC-SLP [phone]  Claudine Mouton 12/24/2011, 2:46 PM

## 2011-12-24 NOTE — Evaluation (Signed)
Clinical/Bedside Swallow Evaluation Patient Details  Name: David [name] MRN: [id number] Date of Birth: [birth year]  Today's Date: 12/24/2011 Time: 7829-5621 SLP Time Calculation (min): 16 min  Past Medical History:  Past Medical History  Diagnosis Date  . Community acquired pneumonia     Right middle lobe community-acquired pneumonia  . Toxic metabolic encephalopathy   . Chronic atrial flutter   . Tobacco abuse   . History of coronary artery disease   . Ischemic cardiomyopathy   . History of prostate cancer   . Chronic obstructive pulmonary disease     with mild bronchospasm, improved  . Hypertension   . History of alcohol abuse     History of prior heavy alcohol abuse  . History of depression   . Altered mental status 2012   . Noncompliance   . Depression   . Ulcer   . Hx of migraines   . Hyperlipidemia    Past Surgical History:  Past Surgical History  Procedure Date  . Appendectomy    HPI:  This is a 71 year old male that had been asked to admit for pneumonia. Patient is awake alert, however, unable to get any detailed information as patient's short of breath and difficult to understand. Currently patient not very hypoxic, with a normal chest x-ray the patient is wheezing and quite rhonchorous. Patient lives in a independent living facility. He does smoke and he does have COPD. 2007 Cervical spine shows cervical osteophytes.    Assessment / Plan / Recommendation Clinical Impression  Pt demonstrates indirect signs concerning for decreased airway protection/aspiration including rapid respiratory rate, increasingly wet respirations following PO though voice stays dry, suspected delayed swallow initiation and documented history of cervical osteophytes. Recommend pt continue current diet, but undergo objective testing of swallow function given multiple risk factors.     Aspiration Risk  Moderate    Diet Recommendation Regular;Thin liquid   Liquid Administration via:  Cup Medication Administration: Whole meds with liquid Supervision: Full supervision/cueing for compensatory strategies Compensations: Slow rate;Small sips/bites Postural Changes and/or Swallow Maneuvers: Seated upright 90 degrees    Other  Recommendations Recommended Consults: MBS Oral Care Recommendations: Oral care BID   Follow Up Recommendations       Frequency and Duration        Pertinent Vitals/Pain NA    SLP Swallow Goals     Swallow Study Prior Functional Status       General HPI: This is a 71 year old male that had been asked to admit for pneumonia. Patient is awake alert, however, unable to get any detailed information as patient's short of breath and difficult to understand. Currently patient not very hypoxic, with a normal chest x-ray the patient is wheezing and quite rhonchorous. Patient lives in a independent living facility. He does smoke and he does have COPD. 2007 Cervical spine shows cervical osteophytes.  Type of Study: Bedside swallow evaluation Previous Swallow Assessment: none Temperature Spikes Noted: No Respiratory Status: Supplemental O2 delivered via (comment) Behavior/Cognition: Alert;Confused Oral Cavity - Dentition: Poor condition;Missing dentition;Edentulous Self-Feeding Abilities: Able to feed self Patient Positioning: Upright in bed Baseline Vocal Quality: Clear Volitional Cough: Congested Volitional Swallow: Able to elicit    Oral/Motor/Sensory Function Overall Oral Motor/Sensory Function: Appears within functional limits for tasks assessed   Ice Chips     Thin Liquid Thin Liquid: Impaired Presentation: Cup;Straw Pharyngeal  Phase Impairments: Suspected delayed Swallow;Other (comments) (wet respirations, voice dry)    Nectar Thick Nectar Thick Liquid: Not tested   Honey Thick Honey  Thick Liquid: Not tested   Puree Puree: Impaired Presentation: Spoon Pharyngeal Phase Impairments: Suspected delayed Swallow (wet respirations, dry vocal  quality)   Solid   GO Functional Assessment Tool Used: clinical jugement Functional Limitations: Swallowing Swallow Current Status (Z6109): At least 20 percent but less than 40 percent impaired, limited or restricted Swallow Goal Status [phone]): At least 1 percent but less than 20 percent impaired, limited or restricted  Solid: Not tested      Harlon Ditty, MA CCC-SLP [phone]  Jaclyn Andy, Riley Nearing 12/24/2011,10:12 AM

## 2011-12-24 NOTE — Progress Notes (Signed)
  Echocardiogram 2D Echocardiogram has been performed.  David [name] 12/24/2011, 3:59 PM

## 2011-12-24 NOTE — Care Management Note (Addendum)
    Page 1 of 2   12/31/2011     10:26:44 AM   CARE MANAGEMENT NOTE 12/31/2011  Patient:  David [name], David [name]   Account Number:  [id number]  Date Initiated:  12/24/2011  Documentation initiated by:  AMERSON,JULIE  Subjective/Objective Assessment:   PT ADM ON 12/23/11 WITH PNEUMONIA AND DEHYDRATION.  PTA, PT RESIDES AT [facility].     Action/Plan:   WILL CONSULT CSW TO FACILITATE RETURN TO ALF WHEN MEDICALLY STABLE.  WILL FOLLOW.   Anticipated DC Date:  12/27/2011   Anticipated DC Plan:  ASSISTED LIVING / REST HOME  In-house referral  Clinical Social Worker      DC Planning Services  CM consult      Choice offered to / List presented to:  C-4 Adult Children        HH arranged  HH-1 RN  HH-10 DISEASE MANAGEMENT  HH-2 PT  HH-5 SPEECH THERAPY      HH agency  [facility]   Status of service:  Completed, signed off Medicare Important Message given?   (If response is "NO", the following Medicare IM given date fields will be blank) Date Medicare IM given:   Date Additional Medicare IM given:    Discharge Disposition:  HOME W HOME HEALTH SERVICES  Per UR Regulation:  Reviewed for med. necessity/level of care/duration of stay  If discussed at Long Length of Stay Meetings, dates discussed:    Comments:  12-31-11 [address] [place] [phone] CM did modify orders for OT and SLP. No further needs from CM at this time.   12-30-11 1531 Tomi Bamberger, RN,BSN [phone] Pt plan for d/c in am with [facility] and PT services. CM did make referral for services with [facility] and SOC to begin within 24-48 hours post d/c.

## 2011-12-24 NOTE — Progress Notes (Addendum)
[facility] Progress Note David [name] TEAM 1 - Stepdown/ICU TEAM   David [name] ION:[id number] DOB: 1940-10-14 DOA: 12/23/2011 PCP: David [name]  Brief narrative: 71 year old male patient of an assisted living facility normally quite independent and alert and oriented according to staff. He was sent to the emergency department because of apparent shortness of breath and difficulty in staff understanding what he was saying. He is not hypoxic at presentation and his chest x-ray confirmed no focal findings although on exam he was wheezing and quite rhonchorous. Patient apparently continues to smoke he carries a diagnosis of COPD. Influenza panel was negative. He was subsequently admitted to the step down unit as a COPD exacerbation.  Assessment/Plan: Principal Problem:  *Dyspnea and fever- due to Healthcare-associated pneumonia Vs bronchitis *Despite negative x-ray findings symptoms are more consistent with pneumonia process - SLP eval negative *Continue empiric antibiotics and provide supportive care with oxygen and nebulizer *Currently continues without needing oxygen *If symptoms worsen especially after hydration we'll repeat a chest x-ray  Active Problems:  Dehydration *Continue IV fluids noting hemoglobin has dropped 2 g since admission   Metabolic encephalopathy/? Undiagnosed dementia *According to staff at nursing facility the patient's confusion is not normal and suspect underlying infectious process as etiology *Patient does not have any focal neurological symptoms that would be concerning for CVA but may benefit from CT head or MRI of brain to help clarify if symptoms persist *Review of pre-admission occasions reveal patient was on Seroquel and Remeron *Formal swallowing evaluation by speech therapy- negative   Thrombocytopenia *Likely due to consumption secondary to acute infectious process *Followup on [name] culture results *Follow CBC   Atrial flutter *Rate has been  variable and tachycardic *Was on carvedilol prior to admission- will reorder   Ischemic cardiomyopathy/Chronic systolic congestive heart failure, NYHA class 3 (EF 25%) *Last echocardiogram completed in 2008-repeat this admission *Cautious administration of IV fluid- quite dehydrated per urine specific gravity   COPD, mild/continued Tobacco abuse *Currently not wheezing and does not appear to be experiencing an exacerbation-given confusion symptoms we'll discontinue steroids and monitor   CAD (coronary artery disease) *Currently without any complaints of chest pain *Initial EKG nonischemic    HTN (hypertension) *Blood pressure actually soft so cautious administration of beta blocker for rate control   Unspecified disorder of thyroid *Dr. Everardo [name] outpatient meds reviewed-patient with prior low TSH and given persistent tachycardia Will repeat TSH while here   Warfarin anticoagulation *INR was subtherapeutic at presentation *Pharmacy managing   DVT prophylaxis: Add SCDs while awaiting therapeutic INR Code Status: DO NOT RESUSCITATE Family Communication: Spoke only to patient Disposition Plan: Transfer to telemetry  Consultants: None  Procedures: None  Antibiotics: Zosyn 12/2 >>> Vancomycin 12/3 >>> Levaquin 12/3 >>>  HPI/Subjective: Patient awake but confused and giving an inconsistent preadmission history. Currently denies chest pain or shortness of breath. Noted with wet sounding productive cough.   Objective: Blood pressure 99/81, pulse 89, temperature 97.8 F (36.6 C), temperature source Oral, resp. rate 24, height 6' (1.829 m), weight 64.4 kg (141 lb 15.6 oz), SpO2 93.00%.  Intake/Output Summary (Last 24 hours) at 12/24/11 1112 Last data filed at 12/24/11 1100  Gross per 24 hour  Intake 412.33 ml  Output    700 ml  Net -287.67 ml     Exam: Followup exam completed.  Data Reviewed: Basic Metabolic Panel:  Lab 12/24/11 2440 12/23/11 2149  NA 138 135  K  3.7 4.8  CL 105 100  CO2 27 21  GLUCOSE 110* 138*  BUN 10 14  CREATININE 0.54 0.66  CALCIUM 8.5 9.5  MG -- --  PHOS -- --   Liver Function Tests:  Lab 12/23/11 2149  AST 34  ALT 18  ALKPHOS 63  BILITOT 0.7  PROT 7.6  ALBUMIN 3.7   No results found for this basename: LIPASE:5,AMYLASE:5 in the last 168 hours No results found for this basename: AMMONIA:5 in the last 168 hours CBC:  Lab 12/24/11 0523 12/23/11 2240  WBC 8.2 9.5  NEUTROABS -- 7.0  HGB 13.9 15.1  HCT 41.0 43.1  MCV 93.8 92.9  PLT 134* 137*   Cardiac Enzymes: No results found for this basename: CKTOTAL:5,CKMB:5,CKMBINDEX:5,TROPONINI:5 in the last 168 hours BNP (last 3 results) No results found for this basename: PROBNP:3 in the last 8760 hours CBG:  Lab 12/24/11 0259  GLUCAP 149*    Recent Results (from the past 240 hour(s))  MRSA PCR SCREENING     Status: Normal   Collection Time   12/24/11  3:00 AM      Component Value Range Status Comment   MRSA by PCR NEGATIVE  NEGATIVE Final      Studies:  Recent x-ray studies have been reviewed in detail by the Attending Physician  Scheduled Meds:  Reviewed in detail by the Attending Physician   David [name], ANP [facility] Office  [phone] Pager [phone]  On-Call/Text Page:      [name]      password [email or web address]  If 7PM-7AM, please contact night-coverage [email or web address] Password [email or web address] 12/24/2011, 11:12 AM   LOS: 1 day   I have examined the patient and reviewed the chart. I have modified the above note and agree with it.   David Cantor, MD [phone]

## 2011-12-24 NOTE — Progress Notes (Signed)
ANTICOAGULATION CONSULT NOTE - Initial Consult  Pharmacy Consult for Coumadin Indication: H/o atrial flutter  Allergies  Allergen Reactions  . Oat Flour (Oat)     Patient Measurements: Height: 6' (182.9 cm) Weight: 141 lb 15.6 oz (64.4 kg) IBW/kg (Calculated) : 77.6   Vital Signs: Temp: 97.9 F (36.6 C) (12/03 0302) Temp src: Oral (12/03 0302) BP: 148/119 mmHg (12/03 0145) Pulse Rate: 112  (12/03 0145)  Labs:  Basename 12/23/11 2240 12/23/11 2149  HGB 15.1 --  HCT 43.1 --  PLT 137* --  APTT -- --  LABPROT -- 17.1*  INR -- 1.43  HEPARINUNFRC -- --  CREATININE -- 0.66  CKTOTAL -- --  CKMB -- --  TROPONINI -- --    Estimated Creatinine Clearance: 77.1 ml/min (by C-G formula based on Cr of 0.66).   Medical History: Past Medical History  Diagnosis Date  . Community acquired pneumonia     Right middle lobe community-acquired pneumonia  . Toxic metabolic encephalopathy   . Chronic atrial flutter   . Tobacco abuse   . History of coronary artery disease   . Ischemic cardiomyopathy   . History of prostate cancer   . Chronic obstructive pulmonary disease     with mild bronchospasm, improved  . Hypertension   . History of alcohol abuse     History of prior heavy alcohol abuse  . History of depression   . Altered mental status 2012   . Noncompliance   . Depression   . Ulcer   . Hx of migraines   . Hyperlipidemia     Medications:  Scheduled:    . [COMPLETED] acetaminophen  650 mg Oral Once  . [COMPLETED] albuterol  5 mg Nebulization Once  . DULoxetine  60 mg Oral Daily  . enoxaparin (LOVENOX) injection  40 mg Subcutaneous Daily  . guaiFENesin  600 mg Oral BID  . methylPREDNISolone (SOLU-MEDROL) injection  60 mg Intravenous BID  . nicotine  14 mg Transdermal Daily  . [COMPLETED] oseltamivir  75 mg Oral Once  . paliperidone  6 mg Oral Daily  . piperacillin-tazobactam (ZOSYN)  IV  3.375 g Intravenous Q8H  . QUEtiapine  50 mg Oral BID  . [COMPLETED]  sodium chloride  2,000 mL Intravenous Once  . sodium chloride  3 mL Intravenous Q12H  . topiramate  25 mg Oral QHS  . [COMPLETED] vancomycin  1,000 mg Intravenous Once  . warfarin  5 mg Oral Custom  . warfarin  7.5 mg Oral Q Sun-1800  . Warfarin - Physician Dosing Inpatient   Does not apply q1800  . [DISCONTINUED] piperacillin-tazobactam (ZOSYN)  IV  3.375 g Intravenous Once    Assessment: 71 yo male admitted with shortness of breath. Pharmacy consulted to manage Coumadin for h/o atrial flutter. INR is 1.43 on admit. Patient to receive Lovenox 40mg  SQ daily until INR > 2.   Goal of Therapy:  INR 2-3 Monitor platelets by anticoagulation protocol: Yes   Plan:  1. Coumadin 7.5mg  po today.  2. Daily PT / INR  Emeline Gins 12/24/2011,3:20 AM

## 2011-12-24 NOTE — H&P (Addendum)
PCP:   Mikki [place]   Chief Complaint:  Shortness of breath  HPI: This is a 71 year old male that had been asked to admit for pneumonia. Patient is awake alert, however, unable to get any detailed information as patient's short of breath and difficult to understand. Currently patient not very hypoxic, with a normal chest x-ray the patient is wheezing and quite rhonchorous. Patient lives in a independent living facility. He does smoke and he does have COPD.   Review of Systems:  Unable to obtain  Past Medical History: Past Medical History  Diagnosis Date  . Community acquired pneumonia     Right middle lobe community-acquired pneumonia  . Toxic metabolic encephalopathy   . Chronic atrial flutter   . Tobacco abuse   . History of coronary artery disease   . Ischemic cardiomyopathy   . History of prostate cancer   . Chronic obstructive pulmonary disease     with mild bronchospasm, improved  . Hypertension   . History of alcohol abuse     History of prior heavy alcohol abuse  . History of depression   . Altered mental status 2012   . Noncompliance   . Depression   . Ulcer   . Hx of migraines   . Hyperlipidemia    Past Surgical History  Procedure Date  . Appendectomy     Medications: Prior to Admission medications   Medication Sig Start Date End Date Taking? Authorizing Provider  albuterol (PROVENTIL HFA;VENTOLIN HFA) 108 (90 BASE) MCG/ACT inhaler Inhale 2 puffs into the lungs every 6 (six) hours as needed.   Yes Historical Provider, MD  carvedilol (COREG) 3.125 MG tablet Take 3.125 mg by mouth 2 (two) times daily with a meal.   Yes Historical Provider, MD  DULoxetine (CYMBALTA) 60 MG capsule Take 60 mg by mouth daily.   Yes Historical Provider, MD  HYDROcodone-acetaminophen (NORCO/VICODIN) 5-325 MG per tablet Take 1 tablet by mouth 4 (four) times daily as needed. pain 11/04/11  Yes Historical Provider, MD  meloxicam (MOBIC) 7.5 MG tablet Take 7.5 mg by mouth daily.   Yes  Historical Provider, MD  mirtazapine (REMERON) 15 MG tablet Take 15 mg by mouth at bedtime.   Yes Historical Provider, MD  Multiple Vitamin (MULTIVITAMIN) tablet Take 1 tablet by mouth daily.   Yes Historical Provider, MD  paliperidone (INVEGA) 6 MG 24 hr tablet Take 6 mg by mouth every morning.   Yes Historical Provider, MD  QUEtiapine (SEROQUEL) 50 MG tablet Take 50 mg by mouth 2 (two) times daily.   Yes Historical Provider, MD  QVAR 40 MCG/ACT inhaler Take 2 puffs by mouth Twice daily. 11/11/11  Yes Historical Provider, MD  rosuvastatin (CRESTOR) 20 MG tablet Take 20 mg by mouth daily.   Yes Historical Provider, MD  senna (SENOKOT) 8.6 MG tablet Take 1 tablet by mouth at bedtime.    Yes Historical Provider, MD  topiramate (TOPAMAX) 25 MG tablet Take 25 mg by mouth at bedtime.    Yes Historical Provider, MD  warfarin (COUMADIN) 5 MG tablet Take 5 mg by mouth daily. Or as directed 7.5mg  on Sundays   Yes Historical Provider, MD    Allergies:   Allergies  Allergen Reactions  . Oat Flour (Oat)     Social History:  reports that he has been smoking Cigarettes.  He has a 20 pack-year smoking history. He has never used smokeless tobacco. He reports that he drinks alcohol. He reports that he does not use illicit  drugs.  Family History: Family History  Problem Relation Age of Onset  . Cancer Father     Prostate Cancer  . Heart disease Other     Parent  . Alcohol abuse Other     Parent  . Arthritis Other     parent  . Hypertension Other     Parent    Physical Exam: Filed Vitals:   12/23/11 2230 12/23/11 2245 12/23/11 2345 12/24/11 0015  BP: 157/142 132/60 118/65 115/73  Pulse: 53  121 110  Temp:      TempSrc:      Resp: 34 36 35 28  SpO2: 95% 95% 89% 95%    General:  Alert and oriented times three, ill-appearing and weak gentleman Eyes: PERRLA, pink conjunctiva, no scleral icterus ENT: Moist oral mucosa, neck supple, no thyromegaly Lungs: clear to ascultation, rhonchorous  throughout, wheezing through out, no use of accessory muscles Cardiovascular: regular rate and rhythm, no regurgitation, no gallops, no murmurs. No carotid bruits, no JVD Abdomen: soft, positive BS, non-tender, non-distended, no organomegaly, not an acute abdomen GU: not examined Neuro: CN II - XII grossly intact, sensation intact Musculoskeletal: strength 5/5 all extremities, no clubbing, cyanosis or edema Skin: no rash, no subcutaneous crepitation, no decubitus Psych: appropriate patient   Labs on Admission:   [facility] 12/23/11 2149  NA 135  K 4.8  CL 100  CO2 21  GLUCOSE 138*  BUN 14  CREATININE 0.66  CALCIUM 9.5  MG --  PHOS --    Basename 12/23/11 2149  AST 34  ALT 18  ALKPHOS 63  BILITOT 0.7  PROT 7.6  ALBUMIN 3.7   No results found for this basename: LIPASE:2,AMYLASE:2 in the last 72 hours  Basename 12/23/11 2240  WBC 9.5  NEUTROABS 7.0  HGB 15.1  HCT 43.1  MCV 92.9  PLT 137*   Results for AKILI, CORSETTI (MRN [id number]) as of 12/24/2011 00:55  Ref. Range 12/23/2011 21:49  Prothrombin Time Latest Range: 11.6-15.2 seconds 17.1 (H)  INR Latest Range: 0.00-1.49  1.43    Micro Results: Results for KINGSON, LOHMEYER (MRN [id number]) as of 12/24/2011 00:55  Ref. Range 12/23/2011 20:54  Color, Urine Latest Range: YELLOW  YELLOW  APPearance Latest Range: CLEAR  CLOUDY (A)  Specific Gravity, Urine Latest Range: 1.005-1.030  1.032 (H)  pH Latest Range: 5.0-8.0  5.0  Glucose Latest Range: NEGATIVE mg/dL NEGATIVE  Bilirubin Urine Latest Range: NEGATIVE  SMALL (A)  Ketones, ur Latest Range: NEGATIVE mg/dL 15 (A)  Protein Latest Range: NEGATIVE mg/dL 782 (A)  Urobilinogen, UA Latest Range: 0.0-1.0 mg/dL 2.0 (H)  Nitrite Latest Range: NEGATIVE  NEGATIVE  Leukocytes, UA Latest Range: NEGATIVE  NEGATIVE  Hgb urine dipstick Latest Range: NEGATIVE  SMALL (A)  Urine-Other No range found AMORPHOUS URATES/PHOSPHATES  WBC, UA Latest Range: <3 WBC/hpf 0-2  RBC / HPF Latest  Range: <3 RBC/hpf 3-6  Squamous Epithelial / LPF Latest Range: RARE  RARE  Bacteria, UA Latest Range: RARE  RARE     Radiological Exams on Admission: Dg Chest 2 View  12/23/2011  *RADIOLOGY REPORT*  Clinical Data: Back pain, groin pain, cough, chills, fever  CHEST - 2 VIEW  Comparison: 04/11/2011  Findings: Cardiomediastinal silhouette is stable.  No acute infiltrate or pleural effusion.  No pulmonary edema.  Bony thorax is stable.  IMPRESSION: No active disease.   Original Report Authenticated By: Natasha Mead, M.D.     *RADIOLOGY REPORT*  Clinical Data: Pain and numbness in the groin  for 2 days; cough.  Back pain.  CT CHEST WITH CONTRAST  Technique: Multidetector CT imaging of the chest was performed  following the standard protocol during bolus administration of  intravenous contrast.  Contrast: 75mL OMNIPAQUE IOHEXOL 300 MG/ML SOLN  Comparison: None.  Findings: Mild emphysema is noted at the upper lung lobes. The  lungs are otherwise clear. There is no evidence of focal  consolidation, pleural effusion or pneumothorax. No masses are  identified.  Diffuse coronary artery calcifications are seen. A 1.1 cm right  paratracheal node is seen; aortopulmonary window nodes are  borderline normal in size. The mediastinum is otherwise  unremarkable in appearance. No pericardial effusion is identified.  The great vessels are grossly unremarkable in appearance. Air  within the pulmonary outflow tract is transient in nature.  Scattered calcification is noted along the thoracic and upper  abdominal aorta.  No axillary lymphadenopathy is seen. The visualized portions of  the thyroid gland are unremarkable in appearance, aside from mild  vague hypodensities, without a dominant mass. The visualized  portions of the liver and spleen are unremarkable.  No acute osseous abnormalities are identified.  IMPRESSION:  1. Mild emphysema noted at the upper lung lobes. Lungs otherwise  clear.  2. Diffuse  coronary artery calcifications seen.  3. 1.1 cm right paratracheal node, nonspecific in appearance.  4. Scattered calcification along the thoracic and upper abdominal   Assessment/Plan Present on Admission:  Tobacco abuse COPD exacerbation Admit to step down. Patient looks much worse clinically than imaging studies thus far. Chest x-ray normal but  lung exam quite significant. Zosyn ordered Mucinex scheduled, Solu-Medrol ordered Duo nebs and oxygen  Sputum cultures, Legionella etc.  Rapid flu ordered and will start patient on Tamiflu until resulted  . Atrial flutter . CAD (coronary artery disease) . Tobacco abuse . HTN (hypertension) . Ischemic cardiomyopathy . Unspecified disorder of thyroid Stable resume home medication Pharmacy to manage Coumadin   Full code DVT prophylaxis   Kobe Jansma 12/24/2011, 12:50 AM

## 2011-12-24 NOTE — Progress Notes (Signed)
Clinical Social Work Department BRIEF PSYCHOSOCIAL ASSESSMENT 12/24/2011  Patient:  David [name], David [name]     Account Number:  [id number]     Admit date:  12/23/2011  Clinical Social Worker:  Dennison Bulla  Date/Time:  12/24/2011 11:15 AM  Referred by:  Physician  Date Referred:  12/24/2011 Referred for  ALF Placement   Other Referral:   Interview type:  Family Other interview type:   David [name]    PSYCHOSOCIAL DATA Living Status:  FACILITY Admitted from facility:  [facility] Level of care:  Assisted Living Primary support name:  David [name] Primary support relationship to patient:  FAMILY Degree of support available:   Adequate    CURRENT CONCERNS Current Concerns  Post-Acute Placement   Other Concerns:    SOCIAL WORK ASSESSMENT / PLAN CSW received referral due to patient being admitted from a facility. CSW reviewed chart and spoke with bedside RN. RN reports that patient is confused. CSW called and spoke with emergency contact David [name])    CSW introduced myself and explained role. Emergency contact is patient's niece. Patient originally from [place], [place] and the rest of his family still lives there. Niece assists with any care and reports no HCPOA designated. Niece reports that patient has been at [facility] for about 3-4 years. Patient enjoys placement because it is [place] and he is able to smoke. Patient has expressed interest in other facilities. Niece reports that patient can return to [facility] but is interested in other facilities. CSW emailed niece a list of ALFs. Niece reports that once patient is more alert that she will take him to tour different facilities if interested. Niece had questions regarding Medicaid and CSW referred her to Department of Social Services. CSW spoke with ALF who is agreeable to accept patient back at dc. ALF assists patient with dressing and bathing and medication management.    CSW completed FL2 and will continue to follow to assist with  dc.   Assessment/plan status:  Psychosocial Support/Ongoing Assessment of Needs Other assessment/ plan:   Information/referral to community resources:   ALF list for [facility]  Department of Social Services    PATIENT'S/FAMILY'S RESPONSE TO PLAN OF CARE: Patient confused and unable to participate in assessment. Niece agreeable to consult. Niece appreciative of CSW consult and agreeable to patient returning to ALF at dc.

## 2011-12-25 ENCOUNTER — Encounter (HOSPITAL_COMMUNITY): Payer: Self-pay | Admitting: Physician Assistant

## 2011-12-25 DIAGNOSIS — R0602 Shortness of breath: Secondary | ICD-10-CM

## 2011-12-25 DIAGNOSIS — I4892 Unspecified atrial flutter: Secondary | ICD-10-CM

## 2011-12-25 LAB — URINE CULTURE
Colony Count: NO GROWTH
Culture: NO GROWTH

## 2011-12-25 LAB — CBC
HCT: 39.5 % (ref 39.0–52.0)
Hemoglobin: 13.2 g/dL (ref 13.0–17.0)
MCH: 31.4 pg (ref 26.0–34.0)
MCV: 93.8 fL (ref 78.0–100.0)
RBC: 4.21 MIL/uL — ABNORMAL LOW (ref 4.22–5.81)

## 2011-12-25 LAB — BASIC METABOLIC PANEL
BUN: 8 mg/dL (ref 6–23)
CO2: 26 mEq/L (ref 19–32)
Calcium: 9.1 mg/dL (ref 8.4–10.5)
Creatinine, Ser: 0.62 mg/dL (ref 0.50–1.35)
Glucose, Bld: 102 mg/dL — ABNORMAL HIGH (ref 70–99)
Sodium: 139 mEq/L (ref 135–145)

## 2011-12-25 LAB — HEPARIN LEVEL (UNFRACTIONATED): Heparin Unfractionated: 0.87 IU/mL — ABNORMAL HIGH (ref 0.30–0.70)

## 2011-12-25 MED ORDER — CARVEDILOL 6.25 MG PO TABS
6.2500 mg | ORAL_TABLET | Freq: Two times a day (BID) | ORAL | Status: DC
  Administered 2011-12-26 – 2011-12-28 (×5): 6.25 mg via ORAL
  Filled 2011-12-25 (×7): qty 1

## 2011-12-25 MED ORDER — ALBUTEROL SULFATE (5 MG/ML) 0.5% IN NEBU
2.5000 mg | INHALATION_SOLUTION | Freq: Four times a day (QID) | RESPIRATORY_TRACT | Status: DC
  Administered 2011-12-25 – 2011-12-27 (×9): 2.5 mg via RESPIRATORY_TRACT
  Filled 2011-12-25 (×9): qty 0.5

## 2011-12-25 MED ORDER — WARFARIN SODIUM 7.5 MG PO TABS
7.5000 mg | ORAL_TABLET | Freq: Once | ORAL | Status: AC
  Administered 2011-12-25: 7.5 mg via ORAL
  Filled 2011-12-25: qty 1

## 2011-12-25 MED ORDER — IPRATROPIUM BROMIDE 0.02 % IN SOLN
0.5000 mg | RESPIRATORY_TRACT | Status: DC
  Administered 2011-12-25 (×2): 0.5 mg via RESPIRATORY_TRACT
  Filled 2011-12-25 (×2): qty 2.5

## 2011-12-25 MED ORDER — ALBUTEROL SULFATE (5 MG/ML) 0.5% IN NEBU: 2.5000 mg | INHALATION_SOLUTION | RESPIRATORY_TRACT | Status: DC | PRN

## 2011-12-25 MED ORDER — IPRATROPIUM BROMIDE 0.02 % IN SOLN: 0.5000 mg | RESPIRATORY_TRACT | Status: DC

## 2011-12-25 MED ORDER — ALBUTEROL SULFATE (5 MG/ML) 0.5% IN NEBU: 2.5000 mg | INHALATION_SOLUTION | RESPIRATORY_TRACT | Status: DC

## 2011-12-25 MED ORDER — HEPARIN (PORCINE) IN NACL 100-0.45 UNIT/ML-% IJ SOLN
850.0000 [IU]/h | INTRAMUSCULAR | Status: DC
  Administered 2011-12-25: 1000 [IU]/h via INTRAVENOUS
  Administered 2011-12-27: 850 [IU]/h via INTRAVENOUS
  Filled 2011-12-25 (×3): qty 250

## 2011-12-25 MED ORDER — IPRATROPIUM BROMIDE 0.02 % IN SOLN
0.5000 mg | Freq: Four times a day (QID) | RESPIRATORY_TRACT | Status: DC
  Administered 2011-12-26 – 2011-12-27 (×8): 0.5 mg via RESPIRATORY_TRACT
  Filled 2011-12-25 (×8): qty 2.5

## 2011-12-25 MED ORDER — ALBUTEROL SULFATE (5 MG/ML) 0.5% IN NEBU
2.5000 mg | INHALATION_SOLUTION | RESPIRATORY_TRACT | Status: DC
  Administered 2011-12-25 (×2): 2.5 mg via RESPIRATORY_TRACT
  Filled 2011-12-25 (×2): qty 0.5

## 2011-12-25 MED ORDER — IPRATROPIUM BROMIDE 0.02 % IN SOLN: 0.5000 mg | Freq: Four times a day (QID) | RESPIRATORY_TRACT | Status: DC

## 2011-12-25 NOTE — Progress Notes (Signed)
Speech Language Pathology Dysphagia Treatment Patient Details Name: David [name] MRN: [id number] DOB: 04-01-1940 Today's Date: 12/25/2011 Time: 1002-1010 SLP Time Calculation (min): 8 min  Assessment / Plan / Recommendation Clinical Impression  Pt. seen for dysphagia treatment focusing on utilization of compensatory strategies. Pt. is cooperative but confused, requiring total verbal/visual/tactile cues to follow safe swallow strategies (chin tuck, intermittent throat clears). No overt s/s of aspiration observed, mild cough observed x1 (likely related to pna than penetration/aspiration). Recommend continuing Dys. 3 diet with think liquids with full supervision to cue pt. for compensatory strategy. Will continue to follow.     Diet Recommendation  Continue with Current Diet: Dysphagia 3 (mechanical soft);Thin liquid    SLP Plan Continue with current plan of care      Swallowing Goals  SLP Swallowing Goals Patient will consume recommended diet without observed clinical signs of aspiration with: Supervision/safety Swallow Study Goal #1 - Progress: Progressing toward goal Patient will utilize recommended strategies during swallow to increase swallowing safety with: Minimal cueing Swallow Study Goal #2 - Progress: Progressing toward goal  General Temperature Spikes Noted: No Respiratory Status: Supplemental O2 delivered via (comment) Behavior/Cognition: Alert;Requires cueing;Confused Oral Cavity - Dentition: Poor condition;Missing dentition;Edentulous Patient Positioning: Upright in bed  Oral Cavity - Oral Hygiene Does patient have any of the following "at risk" factors?: None of the above Brush patient's teeth BID with toothbrush (using toothpaste with fluoride): Yes   Dysphagia Treatment Treatment focused on: Skilled observation of diet tolerance;Utilization of compensatory strategies Treatment Methods/Modalities: Skilled observation Patient observed directly with PO's: Yes Type of  PO's observed: Regular;Thin liquids Feeding: Able to feed self;Total assist Liquids provided via: Cup;Straw Pharyngeal Phase Signs & Symptoms: Suspected delayed swallow initiation Type of cueing: Verbal;Tactile;Visual Amount of cueing: Total   GO     Theotis Burrow 12/25/2011, 1:06 PM

## 2011-12-25 NOTE — Progress Notes (Signed)
Levaquin not given due to loss of IV access.

## 2011-12-25 NOTE — Progress Notes (Signed)
Received call from monitor tech for sustaining HR 130s- 140s. Pt asymptomatic and resting on bed. Call on-call MD for change. Received order to administer scheduled coreg 3.125mg  now. Will continue to monitor.

## 2011-12-25 NOTE — Progress Notes (Signed)
[facility] Progress Note    David [name] WGN:[id number] DOB: [birth year] DOA: 12/23/2011 PCP: [facility]ki [facility]  Brief narrative: 71 year old male patient of an assisted living facility normally quite independent and alert and oriented according to staff. He was sent to the emergency department because of apparent shortness of breath and difficulty in staff understanding what he was saying. He is not hypoxic at presentation and his chest x-ray confirmed no focal findings although on exam he was wheezing and quite rhonchorous. Patient apparently continues to smoke he carries a diagnosis of COPD. Influenza panel was negative. He was subsequently admitted to the step down unit as a COPD exacerbation.  Assessment/Plan: Principal Problem:  Confusion and dysarthia According to staff at nursing facility the patient's confusion and speech difficulty is not normal. Will check an MRI of the Brain to rule out stroke given possible apical thrombus on echo. Review of pre-admission occasions reveal patient was on Seroquel and Remeron  Atrial flutter Rate has been variable and tachycardic up to 150s On Carvedilol 3.125 mg bid re-started at admission. [facility] Consulted for A Flutter and possible apical thrombus. 12/4   Acute Bronchitis in setting of COPD Negative x-ray findings for pneumonia.  Fever resolved. Continue empiric antibiotics (D/C vanc/zosyn and continue levaquin) supportive care with nebulizer and oxygen if needed. Patient with significant Rales. Steroids were initially ordered but then discontinued due to worsening confusion.  Formal swallowing evaluation by speech therapy- negative  Thrombocytopenia Likely due to consumption secondary to acute infectious process Follow up on all culture results Follow CBC (stable in 130s)  Ischemic cardiomyopathy/Chronic systolic congestive heart failure, NYHA class 3 (EF 25%) Echocardiogram done this admission.  The estimated  ejection fraction was in the range of 20% to 25%. Diffuse hypokinesis. Atrial flutter limits allow evaluation of LV diastolic function. Cannot exclude apical thrombus.  CAD (coronary artery disease) Currently without any complaints of chest pain Initial EKG nonischemic   HTN (hypertension) Blood pressure actually soft so cautious administration of beta blocker for rate control  Unspecified disorder of thyroid TSH is again suppressed (0.242).  Will check full thyroid panel.  Discuss with attending.  Contributing to Aflutter?   Already checking MRI of the brain. Not on Synthroid  Warfarin anticoagulation INR was subtherapeutic at presentation, Pharmacy managing   DVT prophylaxis: Add SCDs while awaiting therapeutic INR Code Status: DO NOT RESUSCITATE Family Communication: Spoke only to patient Disposition Plan: Inpatient  Consultants: [facility]  Procedures: 2 D Echo Results: - Left ventricle: The cavity size was normal. Systolic function was severely reduced. The estimated ejection fraction was in the range of 20% to 25%. Diffuse hypokinesis. Atrial flutter limits allow evaluation of LV diastolic function. Cannot exclude apical thrombus. - Mitral valve: Mild regurgitation. - Left atrium: The atrium was mildly dilated. - Right ventricle: Systolic function was reduced. - Right atrium: The atrium was mildly dilated.    Antibiotics: Zosyn 12/2 >>>12/4 Vancomycin 12/3 >12/4 Levaquin 12/3 >>>  HPI/Subjective: Denies CP or Palpitations.  Difficult to understand.  No complaints.  Per RN he has become more confused as the day goes on.   Objective: Blood pressure 114/73, pulse 120, temperature 97.8 F (36.6 C), temperature source Oral, resp. rate 22, height 6' (1.829 m), weight 64.4 kg (141 lb 15.6 oz), SpO2 96.00%.  Intake/Output Summary (Last 24 hours) at 12/25/11 1400 Last data filed at 12/25/11 1300  Gross per 24 hour  Intake   2355 ml  Output    702 ml  Net    1653 ml     Exam: General: Awake, appears oriented, sitting on the side of the bed. Difficult to understand. Head: Atraumatic normocephalic, poor dentition Neck: Supple, no obvious thyromegaly. Chest: Clear to auscultation, no wheezes crackles or rales, no accessory muscle use. Cardiovascular: Tachycardic, without murmurs rubs or gallops. Extremities warm, pulses 2+ in extremities. Abdomen: Thin, soft, nontender, nondistended, no masses, positive bowel sounds   Data Reviewed: Basic Metabolic Panel:  Lab 12/25/11 1610 12/24/11 1033 12/24/11 0523 12/23/11 2149  NA 139 137 138 135  K 4.0 4.0 3.7 4.8  CL 106 103 105 100  CO2 26 26 27 21   GLUCOSE 102* 167* 110* 138*  BUN 8 9 10 14   CREATININE 0.62 0.56 0.54 0.66  CALCIUM 9.1 8.9 8.5 9.5  MG -- -- -- --  PHOS -- -- -- --   Liver Function Tests:  Lab 12/24/11 1033 12/23/11 2149  AST 28 34  ALT 18 18  ALKPHOS 56 63  BILITOT 0.6 0.7  PROT 6.5 7.6  ALBUMIN 3.1* 3.7    Lab 12/24/11 1033  AMMONIA 31   CBC:  Lab 12/25/11 0440 12/24/11 0523 12/23/11 2240  WBC 8.8 8.2 9.5  NEUTROABS -- -- 7.0  HGB 13.2 13.9 15.1  HCT 39.5 41.0 43.1  MCV 93.8 93.8 92.9  PLT 131* 134* 137*   CBG:  Lab 12/24/11 0259  GLUCAP 149*    Recent Results (from the past 240 hour(s))  URINE CULTURE     Status: Normal   Collection Time   12/23/11  8:54 PM      Component Value Range Status Comment   Specimen Description URINE, CATHETERIZED   Final    Special Requests NONE   Final    Culture  Setup Time 12/23/2011 21:16   Final    Colony Count NO GROWTH   Final    Culture NO GROWTH   Final    Report Status 12/25/2011 FINAL   Final   CULTURE, BLOOD (ROUTINE X 2)     Status: Normal (Preliminary result)   Collection Time   12/23/11  9:54 PM      Component Value Range Status Comment   Specimen Description BLOOD RIGHT HAND   Final    Special Requests BOTTLES DRAWN AEROBIC ONLY 3CC   Final    Culture  Setup Time 12/24/2011 05:50   Final     Culture     Final    Value:        BLOOD CULTURE RECEIVED NO GROWTH TO DATE CULTURE WILL BE HELD FOR 5 DAYS BEFORE ISSUING A FINAL NEGATIVE REPORT   Report Status PENDING   Incomplete   CULTURE, BLOOD (ROUTINE X 2)     Status: Normal (Preliminary result)   Collection Time   12/23/11 10:32 PM      Component Value Range Status Comment   Specimen Description BLOOD RIGHT HAND   Final    Special Requests BOTTLES DRAWN AEROBIC ONLY 10CC   Final    Culture  Setup Time 12/24/2011 05:50   Final    Culture     Final    Value:        BLOOD CULTURE RECEIVED NO GROWTH TO DATE CULTURE WILL BE HELD FOR 5 DAYS BEFORE ISSUING A FINAL NEGATIVE REPORT   Report Status PENDING   Incomplete   CULTURE, BLOOD (ROUTINE X 2)     Status: Normal (Preliminary result)   Collection Time   12/24/11  1:30 AM      Component Value Range Status Comment   Specimen Description BLOOD RIGHT HAND   Final    Special Requests BOTTLES DRAWN AEROBIC ONLY 3CC   Final    Culture  Setup Time 12/24/2011 08:57   Final    Culture     Final    Value:        BLOOD CULTURE RECEIVED NO GROWTH TO DATE CULTURE WILL BE HELD FOR 5 DAYS BEFORE ISSUING A FINAL NEGATIVE REPORT   Report Status PENDING   Incomplete   CULTURE, BLOOD (ROUTINE X 2)     Status: Normal (Preliminary result)   Collection Time   12/24/11  1:43 AM      Component Value Range Status Comment   Specimen Description BLOOD RIGHT ARM   Final    Special Requests BOTTLES DRAWN AEROBIC ONLY 3CC   Final    Culture  Setup Time 12/24/2011 08:57   Final    Culture     Final    Value:        BLOOD CULTURE RECEIVED NO GROWTH TO DATE CULTURE WILL BE HELD FOR 5 DAYS BEFORE ISSUING A FINAL NEGATIVE REPORT   Report Status PENDING   Incomplete   MRSA PCR SCREENING     Status: Normal   Collection Time   12/24/11  3:00 AM      Component Value Range Status Comment   MRSA by PCR NEGATIVE  NEGATIVE Final      Studies:  Recent x-ray studies have been reviewed in detail by the Attending  Physician  Scheduled Meds:  Reviewed in detail by the Attending Physician   Algis Downs, PA-C. [facility] [phone]  On-Call/Text Page:      [email or web address]      password [facility]

## 2011-12-25 NOTE — Consult Note (Signed)
CARDIOLOGY CONSULT NOTE   Patient ID: David [name] MRN: [id number] DOB/AGE: [birth year] 71 y.o.  Admit date: 12/23/2011  Primary Physician   [facility]   PJ Reason for Consultation   Possible apical thrombus  WUJ:[id number] Nesmith is a 71 y.o. male with a history of CAD, systolic CHF and arrhythmia. He was admitted yesterday for increased dyspnea and confusion. The increased dyspnea was felt secondary to COPD exacerbation. He was in atrial fibrillation with rapid ventricular response and his INR was sub-therapeutic. He had an echocardiogram which showed an EF of 20-25% (no significant change) and a possible apical thrombus. Cardiology was asked to evaluate him.  Mr Raucci denies chest pain. He    Past Medical History  Diagnosis Date  . Community acquired pneumonia     Right middle lobe community-acquired pneumonia  . Toxic metabolic encephalopathy   . Chronic atrial flutter   . Tobacco abuse   . History of coronary artery disease 2008    Cath at [facility]: RCA 95%, RI 60%  - medical therapy  . Ischemic cardiomyopathy   . History of prostate cancer   . Chronic obstructive pulmonary disease     with mild bronchospasm, improved  . Hypertension   . History of alcohol abuse     History of prior heavy alcohol abuse  . History of depression   . Altered mental status 2012   . Noncompliance   . Depression   . Ulcer   . Hx of migraines   . Hyperlipidemia      Past Surgical History  Procedure Date  . Appendectomy     Allergies  Allergen Reactions  . Oat Flour (Oat)     I have reviewed the patient's current medications    . albuterol  2.5 mg Nebulization Q4H   And  . ipratropium  0.5 mg Nebulization Q4H  . carvedilol  3.125 mg Oral BID WC  . DULoxetine  60 mg Oral Daily  . enoxaparin (LOVENOX) injection  40 mg Subcutaneous Daily  . guaiFENesin  600 mg Oral BID  . influenza  inactive virus vaccine  0.5 mL Intramuscular Tomorrow-1000  .  levofloxacin (LEVAQUIN) IV  750 mg Intravenous Q24H  . mirtazapine  15 mg Oral QHS  . paliperidone  6 mg Oral Daily  . pneumococcal 23 valent vaccine  0.5 mL Intramuscular Tomorrow-1000  . QUEtiapine  50 mg Oral BID  . sodium chloride  3 mL Intravenous Q12H  . topiramate  25 mg Oral QHS  . [COMPLETED] warfarin  7.5 mg Oral ONCE-1800  . warfarin  7.5 mg Oral ONCE-1800  . Warfarin - Pharmacist Dosing Inpatient   Does not apply q1800      . sodium chloride 10 mL/hr (12/25/11 1506)   acetaminophen, acetaminophen, alum & mag hydroxide-simeth, ondansetron (ZOFRAN) IV, ondansetron, sodium chloride, [DISCONTINUED] albuterol, [DISCONTINUED] albuterol, [DISCONTINUED] albuterol, [DISCONTINUED] ipratropium, [DISCONTINUED] ipratropium  Medication Sig  albuterol (PROVENTIL HFA;VENTOLIN HFA) 108 (90 BASE) MCG/ACT inhaler Inhale 2 puffs into the lungs every 6 (six) hours as needed.  carvedilol (COREG) 3.125 MG tablet Take 3.125 mg by mouth 2 (two) times daily with a meal.  DULoxetine (CYMBALTA) 60 MG capsule Take 60 mg by mouth daily.  HYDROcodone-acetaminophen 5-325 MG per tablet Take 1 tablet by mouth 4 (four) times daily as needed. pain  meloxicam (MOBIC) 7.5 MG tablet Take 7.5 mg by mouth daily.  mirtazapine (REMERON) 15 MG tablet Take 15 mg by mouth at bedtime.  Multiple Vitamin (  MULTIVITAMIN) tablet Take 1 tablet by mouth daily.  paliperidone (INVEGA) 6 MG 24 hr tablet Take 6 mg by mouth every morning.  QUEtiapine (SEROQUEL) 50 MG tablet Take 50 mg by mouth 2 (two) times daily.  QVAR 40 MCG/ACT inhaler Take 2 puffs by mouth Twice daily.  rosuvastatin (CRESTOR) 20 MG tablet Take 20 mg by mouth daily.  senna (SENOKOT) 8.6 MG tablet Take 1 tablet by mouth at bedtime.   topiramate (TOPAMAX) 25 MG tablet Take 25 mg by mouth at bedtime.   warfarin (COUMADIN) 5 MG tablet Take 5 mg by mouth daily. Or as directed 7.5mg  on Sundays   History   Social History  . Marital Status: Legally Separated     Spouse Name: N/A    Number of Children: N/A  . Years of Education: 10   Occupational History  .     Social History Main Topics  . Smoking status: Current Every Day Smoker -- 0.5 packs/day for 40 years    Types: Cigarettes  . Smokeless tobacco: Never Used  . Alcohol Use: Yes  . Drug Use: No  . Sexually Active: Not on file   Other Topics Concern  . Not on file   Social History Narrative   Regular exercise-no..Caffeine Use-yes    Family History  Problem Relation Age of Onset  . Cancer Father     Prostate Cancer  . Heart disease Other     Parent  . Alcohol abuse Other     Parent  . Arthritis Other     parent  . Hypertension Other     Parent     ROS:  Full 14 point review of systems complete and found to be negative unless listed above.  Physical Exam: Blood pressure 101/69, pulse 99, temperature 97.8 F (36.6 C), temperature source Oral, resp. rate 22, height 6' (1.829 m), weight 141 lb 15.6 oz (64.4 kg), SpO2 96.00%.  General: Well developed, well nourished, male in no acute distress Head: Eyes PERRLA, No xanthomas.   Normocephalic and atraumatic, oropharynx without edema or exudate. Dentition: poor Lungs: bilateral rales, a lot of rhonchi Heart: Heart irregular rate and rhythm with S1, S2, no significant murmur. pulses are 2+ all 4 extrem.   Neck: No carotid bruits. No lymphadenopathy.  JVD at 8 cm. Abdomen: Bowel sounds present, abdomen soft and non-tender without masses or hernias noted. Msk:  No spine or cva tenderness. Generalized weakness, no joint deformities or effusions. Extremities: No clubbing or cyanosis. No edema.  Neuro: Alert and oriented X 1. Confused and answers are short but appropriate. He does not remember many details on anything. Psych:  Flat affect, responds appropriately Skin: No rashes or lesions noted.  Labs:   Lab Results  Component Value Date   WBC 8.8 12/25/2011   HGB 13.2 12/25/2011   HCT 39.5 12/25/2011   MCV 93.8 12/25/2011   PLT  131* 12/25/2011    Basename 12/25/11 0440  INR 1.62*     Lab 12/25/11 0440 12/24/11 1033  NA 139 --  K 4.0 --  CL 106 --  CO2 26 --  BUN 8 --  CREATININE 0.62 --  CALCIUM 9.1 --  PROT -- 6.5  BILITOT -- 0.6  ALKPHOS -- 56  ALT -- 18  AST -- 28  GLUCOSE 102* --   Pro B Natriuretic peptide (BNP)  Date/Time Value Range Status  10/14/2010  6:26 PM 271.1* 0 - 125 pg/mL Final   TSH  Date/Time Value Range Status  12/24/2011 10:33 AM 0.242* 0.350 - 4.500 uIU/mL Final   INR  Date/Time Value Range Status  12/25/2011  4:40 AM 1.62* 0.00 - 1.49 Final  12/23/2011  9:49 PM 1.43  0.00 - 1.49 Final  11/29/2011 11:18 AM 3.1   Final  10/18/2011 11:33 AM 2.7   Final  09/06/2011 11:26 AM 2.7   Final  07/26/2011  3:12 PM 2.9   Final  06/14/2011  3:20 PM 2.0   Final  05/03/2011  3:08 PM 2.6   Final  10/17/2010  5:26 AM 1.93* 0.00 - 1.49 Final  10/16/2010  5:20 AM 1.91* 0.00 - 1.49 Final  10/15/2010  7:01 AM 2.46* 0.00 - 1.49 Final  10/14/2010  6:37 PM 2.36* 0.00 - 1.49 Final   Echo: 12/24/2011 Study Conclusions - Left ventricle: The cavity size was normal. Systolic function was severely reduced. The estimated ejection fraction was in the range of 20% to 25%. Diffuse hypokinesis. Atrial flutter limits allow evaluation of LV diastolic function. Cannot exclude apical thrombus. - Mitral valve: Mild regurgitation. - Left atrium: The atrium was mildly dilated. - Right ventricle: Systolic function was reduced. - Right atrium: The atrium was mildly dilated.  ECG: 25-Dec-2011 05:50:51 [facility] ROUTINE RECORD Atrial flutter with variable A-V block with premature ventricular or aberrantly conducted complexes Incomplete left bundle branch block T wave abnormality, consider inferolateral ischemia Abnormal ECG No significant change since Dec-02 37mm/s 62mm/mV 100Hz  8.0.1 12SL 241 HD CID: 1 Referred by: Gery Pray MD Confirmed By: MIHAI CROITORU Vent. rate 108 BPM PR interval *  ms QRS duration 108 ms QT/QTc 322/431 ms P-R-T axes 270 77 259  Radiology:  Dg Chest 2 View 12/23/2011  *RADIOLOGY REPORT*  Clinical Data: Back pain, groin pain, cough, chills, fever  CHEST - 2 VIEW  Comparison: 04/11/2011  Findings: Cardiomediastinal silhouette is stable.  No acute infiltrate or pleural effusion.  No pulmonary edema.  Bony thorax is stable.  IMPRESSION: No active disease.   Original Report Authenticated By: Natasha Mead, M.D.    Ct Chest W Contrast 12/24/2011  *RADIOLOGY REPORT*  Clinical Data: Pain and numbness in the groin for 2 days; cough. Back pain.  CT CHEST WITH CONTRAST  Technique:  Multidetector CT imaging of the chest was performed following the standard protocol during bolus administration of intravenous contrast.  Contrast: 75mL OMNIPAQUE IOHEXOL 300 MG/ML  SOLN  Comparison: None.  Findings: Mild emphysema is noted at the upper lung lobes.  The lungs are otherwise clear.  There is no evidence of focal consolidation, pleural effusion or pneumothorax. No masses are identified.  Diffuse coronary artery calcifications are seen.  A 1.1 cm right paratracheal node is seen; aortopulmonary window nodes are borderline normal in size.  The mediastinum is otherwise unremarkable in appearance.  No pericardial effusion is identified. The great vessels are grossly unremarkable in appearance.  Air within the pulmonary outflow tract is transient in nature. Scattered calcification is noted along the thoracic and upper abdominal aorta.  No axillary lymphadenopathy is seen.  The visualized portions of the thyroid gland are unremarkable in appearance, aside from mild vague hypodensities, without a dominant mass.  The visualized portions of the liver and spleen are unremarkable.  No acute osseous abnormalities are identified.  IMPRESSION:  1.  Mild emphysema noted at the upper lung lobes.  Lungs otherwise clear. 2.  Diffuse coronary artery calcifications seen. 3.  1.1 cm right paratracheal node,  nonspecific in appearance. 4.  Scattered calcification along the thoracic  and upper abdominal aorta.   Original Report Authenticated By: Tonia Ghent, M.D.    Dg Swallowing Func-speech Pathology 12/24/2011  Riley Nearing Deblois, CCC-SLP     12/24/2011  2:47 PM Objective Swallowing Evaluation: Modified Barium Swallowing Study   Patient Details  Name: Hiroshi Krummel MRN: [id number] Date of Birth: [birth year]  Today's Date: 12/24/2011 Time: 1330-1400 SLP Time Calculation (min): 30 min  Past Medical History:  Past Medical History  Diagnosis Date  . Community acquired pneumonia     Right middle lobe community-acquired pneumonia  . Toxic metabolic encephalopathy   . Chronic atrial flutter   . Tobacco abuse   . History of coronary artery disease   . Ischemic cardiomyopathy   . History of prostate cancer   . Chronic obstructive pulmonary disease     with mild bronchospasm, improved  . Hypertension   . History of alcohol abuse     History of prior heavy alcohol abuse  . History of depression   . Altered mental status 2012   . Noncompliance   . Depression   . Ulcer   . Hx of migraines   . Hyperlipidemia    Past Surgical History:  Past Surgical History  Procedure Date  . Appendectomy    HPI:  This is a 71 year old male that had been asked to admit for  pneumonia. Patient is awake alert, however, unable to get any  detailed information as patient's short of breath and difficult  to understand. Currently patient not very hypoxic, with a normal  chest x-ray the patient is wheezing and quite rhonchorous.  Patient lives in a independent living facility. He does smoke and  he does have COPD. 2007 Cervical spine shows cervical  osteophytes.      Assessment / Plan / Recommendation Clinical Impression  Dysphagia Diagnosis: Mild pharyngeal phase dysphagia Clinical impression: Pt presents with a mild pharyngeal phase  dysphagia with trace silent penetration of all consistencies into  the laryngeal vestibule but not touching or passing  the cords. Pt  with a slight dleay in initiation and mild weakness of the  hyolaryngeal mechanism. This results in decreased closure of the  airway during the swallow with trace pnetration, particularly  with puree consistency. Do not feel that this very mild dysphagia  is a contributor to pts respiratory impairement. However do feel  pt would benefit from compensatory strategies and f/u therapy to  reduce penetration of liquids and solids. A chin tuck and  intermittent throat clear would reduce risk of aspiration.  Recommend pt downgrade to a dys 3 (mechanical soft diet) with  thin liquids and strategies and precautions listed below.     Treatment Recommendation  Therapy as outlined in treatment plan below    Diet Recommendation Dysphagia 3 (Mechanical Soft);Thin liquid   Liquid Administration via: Cup;Straw Medication Administration: Crushed with puree Supervision: Patient able to self feed;Full supervision/cueing  for compensatory strategies Compensations: Slow rate;Small sips/bites;Clear throat  intermittently Postural Changes and/or Swallow Maneuvers: Seated upright 90  degrees;Chin tuck;Upright 30-60 min after meal    Other  Recommendations Oral Care Recommendations: Oral care BID   Follow Up Recommendations  Skilled Nursing facility    Frequency and Duration min 2x/week  2 weeks   Pertinent Vitals/Pain NA    SLP Swallow Goals Patient will consume recommended diet without observed clinical  signs of aspiration with: Supervision/safety Patient will utilize recommended strategies during swallow to  increase swallowing safety with: Minimal cueing Goal #3: Pt will complete pharyngeal strengthening  exercises x10  with min verbal cues from SLP  General HPI: This is a 71 year old male that had been asked to  admit for pneumonia. Patient is awake alert, however, unable to  get any detailed information as patient's short of breath and  difficult to understand. Currently patient not very hypoxic, with  a normal chest  x-ray the patient is wheezing and quite  rhonchorous. Patient lives in a independent living facility. He  does smoke and he does have COPD. 2007 Cervical spine shows  cervical osteophytes.  Type of Study: Modified Barium Swallowing Study Temperature Spikes Noted: No Behavior/Cognition: Alert;Confused Oral Cavity - Dentition: Poor condition;Missing  dentition;Edentulous Self-Feeding Abilities: Able to feed self Patient Positioning: Upright in chair Baseline Vocal Quality: Clear Volitional Cough: Congested Volitional Swallow: Able to elicit Anatomy: Within functional limits (Osteophytes do not impact  function) Pharyngeal Secretions: Not observed secondary MBS    Reason for Referral     Oral Phase Oral Preparation/Oral Phase Oral Phase: WFL   Pharyngeal Phase Pharyngeal Phase Pharyngeal Phase: Impaired Pharyngeal - Thin Pharyngeal - Thin Cup: Premature spillage to pyriform  sinuses;Penetration/Aspiration during swallow;Reduced  airway/laryngeal closure;Reduced anterior laryngeal  mobility;Reduced epiglottic inversion;Reduced laryngeal elevation Penetration/Aspiration details (thin cup): Material enters  airway, remains ABOVE vocal cords and not ejected out;Material  enters airway, remains ABOVE vocal cords then ejected  out;Material does not enter airway Pharyngeal - Thin Straw: Premature spillage to pyriform  sinuses;Penetration/Aspiration during swallow;Reduced  airway/laryngeal closure;Reduced anterior laryngeal  mobility;Reduced epiglottic inversion;Reduced laryngeal elevation Penetration/Aspiration details (thin straw): Material enters  airway, remains ABOVE vocal cords and not ejected out;Material  enters airway, remains ABOVE vocal cords then ejected  out;Material does not enter airway Pharyngeal - Solids Pharyngeal - Puree: Premature spillage to pyriform  sinuses;Penetration/Aspiration during swallow;Reduced  airway/laryngeal closure;Reduced anterior laryngeal  mobility;Reduced epiglottic inversion;Reduced  laryngeal  elevation;Pharyngeal residue - pyriform sinuses Penetration/Aspiration details (puree): Material enters airway,  remains ABOVE vocal cords and not ejected out Pharyngeal - Mechanical Soft: Premature spillage to pyriform  sinuses;Penetration/Aspiration during swallow;Reduced  airway/laryngeal closure;Reduced anterior laryngeal  mobility;Reduced epiglottic inversion;Reduced laryngeal  elevation;Pharyngeal residue - pyriform sinuses Penetration/Aspiration details (mechanical soft): Material enters  airway, remains ABOVE vocal cords and not ejected out  Cervical Esophageal Phase    GO             Harlon Ditty, MA CCC-SLP [phone]  Dyanne Iha Riley Nearing 12/24/2011, 2:46 PM      ASSESSMENT AND PLAN:   The patient was seen today by Dr Clifton James, the patient evaluated and the data reviewed.   Possible LV thrombus - His INR was sub-therapeutic on admission. He is currently on DVT Lovenox and coumadin. However, with the possible LV thrombus on echo, we will increase to full anticoagulation with heparin and 24 hour overlap of therapeutic coumadin. We will order another echo with contrast to see if we can further define the thrombus. His INRs have been therapeutic until this admission.  Atrial flutter, RVR - his heart rate has been frequently > 100 and into the 140s multiple times today, likely secondary to acute illness. He is on a low dose of Coreg, we will increase this and continue to follow his heart rate. Consider changing to Lopressor to see if HR>BP more affected by Lopressor than Coreg.  Otherwise, per primary MD Principal Problem:  *Dyspnea Active Problems:  Atrial flutter  CAD (coronary artery disease)  Ischemic cardiomyopathy  Tobacco abuse  HTN (hypertension)  Unspecified disorder of thyroid  Healthcare-associated pneumonia  Chronic  systolic congestive heart failure, NYHA class 3  Dehydration  Metabolic encephalopathy  COPD, mild  Thrombocytopenia  Warfarin  anticoagulation   Signed: Theodore Demark 12/25/2011, 4:24 PM Co-Sign MD  I have personally seen and examined this patient with Theodore Demark, PA-C. I agree with the assessment and plan as outlined above. He has chronic atrial flutter with poor rate control now in setting of acute illness. Will increase Coreg for better rate control. He is on chronic anti-coagulation with coumadin for atrial fibrillation but his INR is subtherapeutic. Possible LV apical thrombus but he is already on long term anticoagulation. Will start heparin drip today and will adjust coumadin accordingly for target INR of 2.0-3.0. Will arrange repeat echo in am with Definity LV contrast agent to better define the LV cavity with suspicion for thrombus. Even if we find an LV apical thrombus, he is already on full dose anticoagulation at home.   Yadriel Kerrigan 4:41 PM 12/25/2011

## 2011-12-25 NOTE — Progress Notes (Signed)
ANTICOAGULATION CONSULT NOTE   Pharmacy Consult for Coumadin Indication: H/o atrial flutter  Allergies  Allergen Reactions  . Oat Flour (Oat)     Patient Measurements: Height: 6' (182.9 cm) Weight: 141 lb 15.6 oz (64.4 kg) IBW/kg (Calculated) : 77.6   Vital Signs: Temp: 98.3 F (36.8 C) (12/04 0440) Temp src: Oral (12/04 0440) BP: 120/74 mmHg (12/04 0440)  Labs:  Basename 12/25/11 0440 12/24/11 1033 12/24/11 0523 12/23/11 2240 12/23/11 2149  HGB 13.2 -- 13.9 -- --  HCT 39.5 -- 41.0 43.1 --  PLT 131* -- 134* 137* --  APTT -- -- -- -- --  LABPROT 18.7* -- -- -- 17.1*  INR 1.62* -- -- -- 1.43  HEPARINUNFRC -- -- -- -- --  CREATININE 0.62 0.56 0.54 -- --  CKTOTAL -- -- -- -- --  CKMB -- -- -- -- --  TROPONINI -- -- -- -- --    Estimated Creatinine Clearance: 77.1 ml/min (by C-G formula based on Cr of 0.62).   Medical History: Past Medical History  Diagnosis Date  . Community acquired pneumonia     Right middle lobe community-acquired pneumonia  . Toxic metabolic encephalopathy   . Chronic atrial flutter   . Tobacco abuse   . History of coronary artery disease   . Ischemic cardiomyopathy   . History of prostate cancer   . Chronic obstructive pulmonary disease     with mild bronchospasm, improved  . Hypertension   . History of alcohol abuse     History of prior heavy alcohol abuse  . History of depression   . Altered mental status 2012   . Noncompliance   . Depression   . Ulcer   . Hx of migraines   . Hyperlipidemia     Medications:  Scheduled:     . carvedilol  3.125 mg Oral BID WC  . DULoxetine  60 mg Oral Daily  . enoxaparin (LOVENOX) injection  40 mg Subcutaneous Daily  . guaiFENesin  600 mg Oral BID  . influenza  inactive virus vaccine  0.5 mL Intramuscular Tomorrow-1000  . levofloxacin (LEVAQUIN) IV  750 mg Intravenous Q24H  . mirtazapine  15 mg Oral QHS  . paliperidone  6 mg Oral Daily  . piperacillin-tazobactam (ZOSYN)  IV  3.375 g  Intravenous Q8H  . pneumococcal 23 valent vaccine  0.5 mL Intramuscular Tomorrow-1000  . QUEtiapine  50 mg Oral BID  . sodium chloride  3 mL Intravenous Q12H  . topiramate  25 mg Oral QHS  . vancomycin  750 mg Intravenous Q12H  . [COMPLETED] warfarin  7.5 mg Oral ONCE-1800  . Warfarin - Pharmacist Dosing Inpatient   Does not apply q1800  . [DISCONTINUED] methylPREDNISolone (SOLU-MEDROL) injection  60 mg Intravenous BID  . [DISCONTINUED] metoprolol  5 mg Intravenous Q6H  . [DISCONTINUED] nicotine  14 mg Transdermal Daily    Assessment: 71 yo male admitted with shortness of breath. Pharmacy consulted to manage Coumadin for h/o atrial flutter. INR today 1.62 Patient to receive Lovenox 40mg  SQ daily until INR > 2.   Goal of Therapy:  INR 2-3 Monitor platelets by anticoagulation protocol: Yes   Plan:  1. Coumadin 7.5mg  po today.  2. Daily PT / INR  Burt Piatek Poteet 12/25/2011,8:51 AM

## 2011-12-25 NOTE — Progress Notes (Signed)
Patient seen and examined this morning. Agree with NPs assessment and plan. Change in mental status from baseline noted. Agree with getting MRI. Apical thrombus noted on echo and note for episodes of tachycardia on monitor. Will follow up with cardiology recommendation.

## 2011-12-25 NOTE — Progress Notes (Signed)
ANTICOAGULATION CONSULT NOTE - Initial Consult  Pharmacy Consult for Heparin Indication: Possible apical thrombus  Allergies  Allergen Reactions  . Oat Flour (Oat)     Patient Measurements: Height: 6' (182.9 cm) Weight: 141 lb 15.6 oz (64.4 kg) IBW/kg (Calculated) : 77.6  Heparin Dosing Weight: 64.4 kg  Vital Signs: Temp: 97.8 F (36.6 C) (12/04 1300) Temp src: Oral (12/04 1300) BP: 101/69 mmHg (12/04 1623) Pulse Rate: 99  (12/04 1623)  Labs:  Basename 12/25/11 0440 12/24/11 1033 12/24/11 0523 12/23/11 2240 12/23/11 2149  HGB 13.2 -- 13.9 -- --  HCT 39.5 -- 41.0 43.1 --  PLT 131* -- 134* 137* --  APTT -- -- -- -- --  LABPROT 18.7* -- -- -- 17.1*  INR 1.62* -- -- -- 1.43  HEPARINUNFRC -- -- -- -- --  CREATININE 0.62 0.56 0.54 -- --  CKTOTAL -- -- -- -- --  CKMB -- -- -- -- --  TROPONINI -- -- -- -- --    Estimated Creatinine Clearance: 77.1 ml/min (by C-G formula based on Cr of 0.62).   Medical History: Past Medical History  Diagnosis Date  . Community acquired pneumonia     Right middle lobe community-acquired pneumonia  . Toxic metabolic encephalopathy   . Chronic atrial flutter   . Tobacco abuse   . History of coronary artery disease 2008    Cath at [facility]: RCA 95%, RI 60%  - medical therapy  . Ischemic cardiomyopathy   . History of prostate cancer   . Chronic obstructive pulmonary disease     with mild bronchospasm, improved  . Hypertension   . History of alcohol abuse     History of prior heavy alcohol abuse  . History of depression   . Altered mental status 2012   . Noncompliance   . Depression   . Ulcer   . Hx of migraines   . Hyperlipidemia     Medications:  Scheduled:    . albuterol  2.5 mg Nebulization Q4H   And  . ipratropium  0.5 mg Nebulization Q4H  . carvedilol  6.25 mg Oral BID WC  . DULoxetine  60 mg Oral Daily  . guaiFENesin  600 mg Oral BID  . influenza  inactive virus vaccine  0.5 mL Intramuscular Tomorrow-1000   . levofloxacin (LEVAQUIN) IV  750 mg Intravenous Q24H  . mirtazapine  15 mg Oral QHS  . paliperidone  6 mg Oral Daily  . pneumococcal 23 valent vaccine  0.5 mL Intramuscular Tomorrow-1000  . QUEtiapine  50 mg Oral BID  . sodium chloride  3 mL Intravenous Q12H  . topiramate  25 mg Oral QHS  . [COMPLETED] warfarin  7.5 mg Oral ONCE-1800  . warfarin  7.5 mg Oral ONCE-1800  . Warfarin - Pharmacist Dosing Inpatient   Does not apply q1800  . [DISCONTINUED] carvedilol  3.125 mg Oral BID WC  . [DISCONTINUED] enoxaparin (LOVENOX) injection  40 mg Subcutaneous Daily  . [DISCONTINUED] ipratropium  0.5 mg Nebulization Q4H  . [DISCONTINUED] metoprolol  5 mg Intravenous Q6H  . [DISCONTINUED] nicotine  14 mg Transdermal Daily  . [DISCONTINUED] piperacillin-tazobactam (ZOSYN)  IV  3.375 g Intravenous Q8H  . [DISCONTINUED] vancomycin  750 mg Intravenous Q12H    Assessment: 71 yr old male admitted yesterday for increased dyspnea and confusion, in a.fib and INR subtherapeutic. Echo showed EF of 20-25% and a possible apical thrombus. INR is 1.62. Mc wants 24 hr overlap of therapeutic INR before d/c heparin.  Pt did receive 40mg  of Lovenox at 11:30. Goal of Therapy:  Heparin level 0.3-0.7 units/ml Monitor platelets by anticoagulation protocol: Yes   Plan:  Will hold bolus heparin since pt has INR of 1.62 and received lovenox 40 mg several hours ago. Will start heparin drip at 1000 units/hr. Will check heparin level in 6 hrs, then daily AM heparin level and CBC while on heparin.  Eugene Garnet 12/25/2011,5:07 PM

## 2011-12-25 NOTE — Progress Notes (Signed)
SLP has reviewed and agrees with student's note below.  Jaisha Villacres Willis Amarya Kuehl M.Ed CCC-SLP Pager [phone]  12/25/2011  

## 2011-12-25 NOTE — Progress Notes (Signed)
Notified pt's HR was in the 140's. Upon assessment pts BP was 116/78, HR: 103, A Flutter on the monitor. Pt asymptomatic with no complaints. Will continue to monitor closely.

## 2011-12-26 ENCOUNTER — Inpatient Hospital Stay (HOSPITAL_COMMUNITY): Payer: PRIVATE HEALTH INSURANCE

## 2011-12-26 DIAGNOSIS — I5022 Chronic systolic (congestive) heart failure: Secondary | ICD-10-CM

## 2011-12-26 DIAGNOSIS — Z7901 Long term (current) use of anticoagulants: Secondary | ICD-10-CM

## 2011-12-26 DIAGNOSIS — I509 Heart failure, unspecified: Secondary | ICD-10-CM

## 2011-12-26 DIAGNOSIS — I2510 Atherosclerotic heart disease of native coronary artery without angina pectoris: Secondary | ICD-10-CM

## 2011-12-26 LAB — CBC
HCT: 41.5 % (ref 39.0–52.0)
MCH: 31.9 pg (ref 26.0–34.0)
MCV: 94.5 fL (ref 78.0–100.0)
RDW: 13.7 % (ref 11.5–15.5)
WBC: 6.6 10*3/uL (ref 4.0–10.5)

## 2011-12-26 LAB — PROTIME-INR: INR: 2.34 — ABNORMAL HIGH (ref 0.00–1.49)

## 2011-12-26 MED ORDER — DIGOXIN 125 MCG PO TABS
0.1250 mg | ORAL_TABLET | Freq: Every day | ORAL | Status: DC
  Administered 2011-12-27 – 2011-12-29 (×3): 0.125 mg via ORAL
  Filled 2011-12-26 (×3): qty 1

## 2011-12-26 MED ORDER — CARVEDILOL 3.125 MG PO TABS
3.1250 mg | ORAL_TABLET | Freq: Once | ORAL | Status: DC
  Filled 2011-12-26: qty 1

## 2011-12-26 MED ORDER — METOPROLOL TARTRATE 1 MG/ML IV SOLN: 2.5000 mg | Freq: Once | INTRAVENOUS | Status: DC

## 2011-12-26 MED ORDER — METOPROLOL TARTRATE 1 MG/ML IV SOLN
INTRAVENOUS | Status: AC
  Filled 2011-12-26: qty 5

## 2011-12-26 MED ORDER — DIGOXIN 0.25 MG/ML IJ SOLN
0.2500 mg | Freq: Three times a day (TID) | INTRAMUSCULAR | Status: AC
  Administered 2011-12-26 (×3): 0.25 mg via INTRAVENOUS
  Filled 2011-12-26 (×8): qty 1

## 2011-12-26 MED ORDER — METOPROLOL TARTRATE 1 MG/ML IV SOLN
2.5000 mg | Freq: Once | INTRAVENOUS | Status: AC
  Administered 2011-12-26: 2.5 mg via INTRAVENOUS

## 2011-12-26 NOTE — Progress Notes (Signed)
ANTICOAGULATION CONSULT NOTE Pharmacy Consult for Heparin Indication: Possible apical thrombus  Allergies  Allergen Reactions  . Oat Flour (Oat)     Patient Measurements: Height: 6' (182.9 cm) Weight: 144 lb 9.6 oz (65.59 kg) IBW/kg (Calculated) : 77.6  Heparin Dosing Weight: 64.4 kg  Vital Signs: Temp: 98.5 F (36.9 C) (12/04 2115) Temp src: Oral (12/04 2115) BP: 109/67 mmHg (12/04 2115) Pulse Rate: 96  (12/04 2115)  Labs:  Basename 12/25/11 2324 12/25/11 0440 12/24/11 1033 12/24/11 0523 12/23/11 2240 12/23/11 2149  HGB -- 13.2 -- 13.9 -- --  HCT -- 39.5 -- 41.0 43.1 --  PLT -- 131* -- 134* 137* --  APTT -- -- -- -- -- --  LABPROT -- 18.7* -- -- -- 17.1*  INR -- 1.62* -- -- -- 1.43  HEPARINUNFRC 0.87* -- -- -- -- --  CREATININE -- 0.62 0.56 0.54 -- --  CKTOTAL -- -- -- -- -- --  CKMB -- -- -- -- -- --  TROPONINI -- -- -- -- -- --    Estimated Creatinine Clearance: 78.6 ml/min (by C-G formula based on Cr of 0.62).  Assessment: 71 yr old male with Afib and apical thrombus for Heparin  Goal of Therapy:  Heparin level 0.3-0.7 units/ml Monitor platelets by anticoagulation protocol: Yes   Plan: Decrease Heparin 850 units/hr Follow-up am labs.  David [name], Gary Fleet 12/26/2011,12:16 AM

## 2011-12-26 NOTE — Progress Notes (Signed)
Pt heart rate sustaining in the 140's, afib. Pt resting comfortably, asymptomatic. [facility] on-call paged, they requested to notify [facility]. New order given from [facility] on-call Adolm Joseph) for 2.5mg  Metoprolol IV now and 3.125mg  Coreg PO now. Will continue to monitor blood pressures and keep MD aware.  Harless Litten, RN 12/26/11

## 2011-12-26 NOTE — Progress Notes (Signed)
Pt HR afib in the 90's after 2.5mg  Metoprolol IV given. BP 88/55. [place] on-call paged and updated on changes. MD Adolm Joseph) ordered to add parameters to morning Coreg dose to hold if SBP < 90 and to notify Provider if held. Paged [facility] on-call to make aware of changes. Will continue to monitor BP and keep MD aware.  Harless Litten, RN 12/26/11

## 2011-12-26 NOTE — Progress Notes (Signed)
Speech Language Pathology Dysphagia Treatment Patient Details Name: David [name] MRN: [id number] DOB: 1941/01/15 Today's Date: 12/26/2011 Time: 7829-5621 SLP Time Calculation (min): 17 min  Assessment / Plan / Recommendation Clinical Impression  Pt seen for reinforcement of compensatory strategies for swallowing. Pt more alert today, verbalizes understanding of precautions and after instruction from SLP was able to consistently complete throat clear after swallow with 100% accuracy without verbal cues. Pt does struggle to achieve complete chin tuck, but puts head in a slightly forward position with moderate verbal cues. SLP will decrease frequency to 1x a week and f/u for further reinforcement.     Diet Recommendation  Continue with Current Diet: Dysphagia 3 (mechanical soft);Thin liquid    SLP Plan Continue with current plan of care   Pertinent Vitals/Pain NA   Swallowing Goals  SLP Swallowing Goals Patient will consume recommended diet without observed clinical signs of aspiration with: Supervision/safety Swallow Study Goal #1 - Progress: Progressing toward goal Patient will utilize recommended strategies during swallow to increase swallowing safety with: Minimal cueing Swallow Study Goal #2 - Progress: Progressing toward goal  General Temperature Spikes Noted: No Respiratory Status: Supplemental O2 delivered via (comment) Behavior/Cognition: Alert;Requires cueing Oral Cavity - Dentition: Poor condition;Missing dentition;Edentulous Patient Positioning: Upright in bed  Oral Cavity - Oral Hygiene Does patient have any of the following "at risk" factors?: None of the above Brush patient's teeth BID with toothbrush (using toothpaste with fluoride): Yes   Dysphagia Treatment Treatment focused on: Skilled observation of diet tolerance;Utilization of compensatory strategies Treatment Methods/Modalities: Skilled observation Patient observed directly with PO's: Yes Type of PO's  observed: Dysphagia 1 (puree);Thin liquids Feeding: Able to feed self Liquids provided via: Cup;Teaspoon Type of cueing: Verbal;Visual Amount of cueing: Moderate   GO     David [name], David [name] 12/26/2011, 4:45 PM

## 2011-12-26 NOTE — Progress Notes (Signed)
[facility] Progress Note    Quavis Klutz ZOX:[id number] DOB: 03/08/1940 DOA: 12/23/2011 PCP: Mikki Harbor  Brief narrative: 71 year old male patient of an assisted living facility normally quite independent and alert and oriented according to staff. He was sent to the emergency department because of apparent shortness of breath and difficulty in staff understanding what he was saying. He is not hypoxic at presentation and his chest x-ray confirmed no focal findings although on exam he was wheezing and quite rhonchorous. Patient apparently continues to smoke he carries a diagnosis of COPD. Influenza panel was negative.   Assessment/Plan: Principal Problem:  Confusion and dysarthia Uncertain etiology.  Patient was found to be confused and dysarthic on 12/4.   MRI brain was ordered to rule out stroke.  It was negative for acute abnormality.  12/5 on exam that patient was less confused and able to answer questions appropriately.   His speech is still difficult to understand, but improved over 12/4.  Atrial flutter Rate has been variable and tachycardic up to 150s [facility] Consulted for A Flutter and possible apical thrombus on 12/4.   They increased his carvedilol and started him on digoxin.   Beta Blockade has to be limited as the patient's BP is soft.   He continues to have rates in the 140s.  Will defer to Cards for further recommendations.  Possible LV thrombus noted on echocardiogram Cards ordered follow up echo with contrast to further evaluate  Acute Bronchitis in setting of COPD Negative x-ray findings for pneumonia.  Fever resolved. Continue empiric antibiotics (D/C vanc/zosyn and continue levaquin) supportive care with nebulizer and oxygen if needed. Patient with improved exam but still has bilateral Rales. Steroids were initially ordered but then discontinued due to worsening confusion.  Formal swallowing evaluation by speech therapy-  negative  Thrombocytopenia Likely due to consumption secondary to acute infectious process and antibiotic therapy.  Antibiotics have been de-escalated. Platelets still declining slightly. Will continue to monitor.  Ischemic cardiomyopathy/Chronic systolic congestive heart failure, NYHA class 3 (EF 25%) Echocardiogram done this admission.  The estimated ejection fraction was in the range of 20% to 25%. Diffuse hypokinesis. Atrial flutter limits allow evaluation of LV diastolic function. Cannot exclude apical thrombus. On carvediolol. Not able to start ACEi or ARB due to hypotension.  CAD (coronary artery disease) Currently without any complaints of chest pain Initial EKG nonischemic   HTN (hypertension) Blood pressure soft.  Medications being directly by Cards due to tachycardia.  Unspecified disorder of thyroid TSH is again suppressed (0.242).  Free T4 is in low normal range.  Warfarin anticoagulation Warfarin being managed by pharmacy.  INR 2.34 Patient was started on IV heparin by cardiology as 2D echo showed possible apical thrombus.  DVT prophylaxis: On IV Heparin. Code Status: DO NOT RESUSCITATE Family Communication: Spoke only to patient Disposition Plan: Inpatient  Consultants: [facility]  Procedures: 2 D Echo Results: - Left ventricle: The cavity size was normal. Systolic function was severely reduced. The estimated ejection fraction was in the range of 20% to 25%. Diffuse hypokinesis. Atrial flutter limits allow evaluation of LV diastolic function. Cannot exclude apical thrombus. - Mitral valve: Mild regurgitation. - Left atrium: The atrium was mildly dilated. - Right ventricle: Systolic function was reduced. - Right atrium: The atrium was mildly dilated.    Antibiotics: Zosyn 12/2 >>>12/4 Vancomycin 12/3 >12/4 Levaquin 12/3 >>>  HPI/Subjective: No complaints.  Tells me he is from [place].  He has two grown sons and several grandchildren.  Much more  coherent today.   Objective: Blood pressure 112/77, pulse 140, temperature 98 F (36.7 C), temperature source Oral, resp. rate 15, height 6' (1.829 m), weight 65.273 kg (143 lb 14.4 oz), SpO2 91.00%.  Intake/Output Summary (Last 24 hours) at 12/26/11 1406 Last data filed at 12/26/11 1350  Gross per 24 hour  Intake    275 ml  Output   2325 ml  Net  -2050 ml     Exam: General: Awake,  oriented, lying in bed, NAD Head: Atraumatic normocephalic, poor dentition Neck: Supple, no obvious thyromegaly. Chest: Significant rales from 12/4 have improved somewhat but are still present.  No accessory muscle use. Cardiovascular: Tachycardic, without murmurs rubs or gallops. Extremities warm, no lower extremity edema Abdomen: Thin, soft, nontender, nondistended, no masses, positive bowel sounds   Data Reviewed: Basic Metabolic Panel:  Lab 12/25/11 5409 12/24/11 1033 12/24/11 0523 12/23/11 2149  NA 139 137 138 135  K 4.0 4.0 3.7 4.8  CL 106 103 105 100  CO2 26 26 27 21   GLUCOSE 102* 167* 110* 138*  BUN 8 9 10 14   CREATININE 0.62 0.56 0.54 0.66  CALCIUM 9.1 8.9 8.5 9.5  MG -- -- -- --  PHOS -- -- -- --   Liver Function Tests:  Lab 12/24/11 1033 12/23/11 2149  AST 28 34  ALT 18 18  ALKPHOS 56 63  BILITOT 0.6 0.7  PROT 6.5 7.6  ALBUMIN 3.1* 3.7    Lab 12/24/11 1033  AMMONIA 31   CBC:  Lab 12/26/11 0900 12/25/11 0440 12/24/11 0523 12/23/11 2240  WBC 6.6 8.8 8.2 9.5  NEUTROABS -- -- -- 7.0  HGB 14.0 13.2 13.9 15.1  HCT 41.5 39.5 41.0 43.1  MCV 94.5 93.8 93.8 92.9  PLT 124* 131* 134* 137*   CBG:  Lab 12/24/11 0259  GLUCAP 149*    Recent Results (from the past 240 hour(s))  URINE CULTURE     Status: Normal   Collection Time   12/23/11  8:54 PM      Component Value Range Status Comment   Specimen Description URINE, CATHETERIZED   Final    Special Requests NONE   Final    Culture  Setup Time 12/23/2011 21:16   Final    Colony Count NO GROWTH   Final    Culture NO  GROWTH   Final    Report Status 12/25/2011 FINAL   Final   CULTURE, BLOOD (ROUTINE X 2)     Status: Normal (Preliminary result)   Collection Time   12/23/11  9:54 PM      Component Value Range Status Comment   Specimen Description BLOOD RIGHT HAND   Final    Special Requests BOTTLES DRAWN AEROBIC ONLY 3CC   Final    Culture  Setup Time 12/24/2011 05:50   Final    Culture     Final    Value:        BLOOD CULTURE RECEIVED NO GROWTH TO DATE CULTURE WILL BE HELD FOR 5 DAYS BEFORE ISSUING A FINAL NEGATIVE REPORT   Report Status PENDING   Incomplete   CULTURE, BLOOD (ROUTINE X 2)     Status: Normal (Preliminary result)   Collection Time   12/23/11 10:32 PM      Component Value Range Status Comment   Specimen Description BLOOD RIGHT HAND   Final    Special Requests BOTTLES DRAWN AEROBIC ONLY 10CC   Final    Culture  Setup Time 12/24/2011  05:50   Final    Culture     Final    Value:        BLOOD CULTURE RECEIVED NO GROWTH TO DATE CULTURE WILL BE HELD FOR 5 DAYS BEFORE ISSUING A FINAL NEGATIVE REPORT   Report Status PENDING   Incomplete   CULTURE, BLOOD (ROUTINE X 2)     Status: Normal (Preliminary result)   Collection Time   12/24/11  1:30 AM      Component Value Range Status Comment   Specimen Description BLOOD RIGHT HAND   Final    Special Requests BOTTLES DRAWN AEROBIC ONLY 3CC   Final    Culture  Setup Time 12/24/2011 08:57   Final    Culture     Final    Value:        BLOOD CULTURE RECEIVED NO GROWTH TO DATE CULTURE WILL BE HELD FOR 5 DAYS BEFORE ISSUING A FINAL NEGATIVE REPORT   Report Status PENDING   Incomplete   CULTURE, BLOOD (ROUTINE X 2)     Status: Normal (Preliminary result)   Collection Time   12/24/11  1:43 AM      Component Value Range Status Comment   Specimen Description BLOOD RIGHT ARM   Final    Special Requests BOTTLES DRAWN AEROBIC ONLY 3CC   Final    Culture  Setup Time 12/24/2011 08:57   Final    Culture     Final    Value:        BLOOD CULTURE RECEIVED NO GROWTH  TO DATE CULTURE WILL BE HELD FOR 5 DAYS BEFORE ISSUING A FINAL NEGATIVE REPORT   Report Status PENDING   Incomplete   MRSA PCR SCREENING     Status: Normal   Collection Time   12/24/11  3:00 AM      Component Value Range Status Comment   MRSA by PCR NEGATIVE  NEGATIVE Final   MRSA PCR SCREENING     Status: Normal   Collection Time   12/25/11 12:27 PM      Component Value Range Status Comment   MRSA by PCR NEGATIVE  NEGATIVE Final      Studies:  Recent x-ray studies have been reviewed in detail by the Attending Physician  Scheduled Meds:  Reviewed in detail by the Attending Physician   Algis Downs, PA-C. [facility] [phone]  On-Call/Text Page:      [email or web address]      password [facility]

## 2011-12-26 NOTE — Progress Notes (Signed)
Pt BP 88/59 HR 142 still in afib. Pt still asymptomatic and resting comfortably. MD paged before administering the metoprolol and coreg. Corinda Gubler MD on-call Adolm Joseph) gave order to hold the 3.125mg  Coreg PO and continue to administer 2.5mg  Metoprolol IV. Will continue to closely monitor BP and keep MD aware.  Harless Litten, RN 12/26/11

## 2011-12-26 NOTE — Progress Notes (Signed)
ANTICOAGULATION CONSULT NOTE Pharmacy Consult for Heparin/Warfarin Indication: Possible apical thrombus  Allergies  Allergen Reactions  . Oat Flour (Oat)    Patient Measurements: Height: 6' (182.9 cm) Weight: 143 lb 14.4 oz (65.273 kg) IBW/kg (Calculated) : 77.6  Heparin Dosing Weight: 64.4 kg  Vital Signs: Temp: 98 F (36.7 C) (12/05 0553) Temp src: Oral (12/05 0553) BP: 90/64 mmHg (12/05 0553) Pulse Rate: 103  (12/05 0553)  Labs:  Basename 12/26/11 0900 12/25/11 2324 12/25/11 0440 12/24/11 1033 12/24/11 0523 12/23/11 2149  HGB 14.0 -- 13.2 -- -- --  HCT 41.5 -- 39.5 -- 41.0 --  PLT 124* -- 131* -- 134* --  APTT -- -- -- -- -- --  LABPROT 24.6* -- 18.7* -- -- 17.1*  INR 2.34* -- 1.62* -- -- 1.43  HEPARINUNFRC 0.46 0.87* -- -- -- --  CREATININE -- -- 0.62 0.56 0.54 --  CKTOTAL -- -- -- -- -- --  CKMB -- -- -- -- -- --  TROPONINI -- -- -- -- -- --   Estimated Creatinine Clearance: 78.2 ml/min (by C-G formula based on Cr of 0.62).  Assessment: 71 yr old male with chronic Afib and apical thrombus for Heparin and Warfarin therapy.  He was on Warfarin prior to admit but INR was sub-therapeutic upon arrival.  Today his INR is back to therapeutic goal of 2.34 with a range of 2.0-3.0.  Heparin level is also within goal range at 0.46 IU/ml.  He is without noted bleeding complications and CBC is stable.  He has some mild thrombocytopenia which was present on admit.  HOME Warfarin Regimen:  Warfarin 5mg  daily except 7.5mg  on Sunday  Drug/Drug Interactions:  Warfarin + Levofloxacin Prolonged prothrombin response in patients undergoing chronic warfarin therapy has been well documented with many antibiotics, including fluoroquinolones.  Our case studies demonstrate significant elevations in INR values during and up to 1 day after levofloxacin therapy in patients undergoing stable warfarin therapy. Ann Pharmacother. 2002 Oct;36(10):1554-7.  Warfarin Education:  Answered questions and  provided information regarding Warfarin therapy.  Goal of Therapy:  Heparin level 0.3-0.7 units/ml Monitor platelets by anticoagulation protocol: Yes   Plan:  Continue IV Heparin at 850 units/hr  Follow heparin levels and adjust accordingly  Warfarin 5 mg today x 1  Follow PT/INR and adjust accordingly  Would stop Heparin tomorrow after 2 day overlap with therapeutic INR  David [name], PharmD., MS Clinical Pharmacist Pager:  [phone] Thank you for allowing pharmacy to be part of this patients care team. 12/26/2011,10:28 AM

## 2011-12-26 NOTE — Progress Notes (Signed)
Patient seen and examined this morning.  Agree with PAs assessment and plan. clinically mush alert and oriented than yesterday. MRI brain done with concern for dysarthria and apical thrombus on echo was negative for acute ischemia. Patient has been tachycardiac overnight . appreciate cardiology eval. Started on IV heparin to bridge coumadin .  also started on IV digoxin for aflutter as unable to titrate coreg dose due to low BP. Echo with contrast ordered to further evaluate apical thrombus. Will follow recs.

## 2011-12-26 NOTE — Progress Notes (Signed)
TELEMETRY: Reviewed telemetry pt in atrial flutter with RVR, rate 120-140: Filed Vitals:   12/26/11 0050 12/26/11 0317 12/26/11 0420 12/26/11 0553  BP: 88/55 115/84  90/64  Pulse: 99 141 93 103  Temp:    98 F (36.7 C)  TempSrc:    Oral  Resp:    15  Height:      Weight:    65.273 kg (143 lb 14.4 oz)  SpO2:  93%  91%    Intake/Output Summary (Last 24 hours) at 12/26/11 0830 Last data filed at 12/26/11 0553  Gross per 24 hour  Intake      0 ml  Output   1052 ml  Net  -1052 ml    SUBJECTIVE Patient denies palpitations. Some cough. Mild SOB.  LABS: Basic Metabolic Panel:  Basename 12/25/11 0440 12/24/11 1033  NA 139 137  K 4.0 4.0  CL 106 103  CO2 26 26  GLUCOSE 102* 167*  BUN 8 9  CREATININE 0.62 0.56  CALCIUM 9.1 8.9  MG -- --  PHOS -- --   Liver Function Tests:  [facility] 12/24/11 1033 12/23/11 2149  AST 28 34  ALT 18 18  ALKPHOS 56 63  BILITOT 0.6 0.7  PROT 6.5 7.6  ALBUMIN 3.1* 3.7   No results found for this basename: LIPASE:2,AMYLASE:2 in the last 72 hours CBC:  Basename 12/25/11 0440 12/24/11 0523 12/23/11 2240  WBC 8.8 8.2 --  NEUTROABS -- -- 7.0  HGB 13.2 13.9 --  HCT 39.5 41.0 --  MCV 93.8 93.8 --  PLT 131* 134* --   Thyroid Function Tests:  Basename 12/24/11 1033  TSH 0.242*  T4TOTAL --  T3FREE --  THYROIDAB --    Radiology/Studies:  Dg Chest 2 View  12/23/2011  *RADIOLOGY REPORT*  Clinical Data: Back pain, groin pain, cough, chills, fever  CHEST - 2 VIEW  Comparison: 04/11/2011  Findings: Cardiomediastinal silhouette is stable.  No acute infiltrate or pleural effusion.  No pulmonary edema.  Bony thorax is stable.  IMPRESSION: No active disease.   Original Report Authenticated By: Natasha Mead, M.D.    Ct Chest W Contrast  12/24/2011  *RADIOLOGY REPORT*  Clinical Data: Pain and numbness in the groin for 2 days; cough. Back pain.  CT CHEST WITH CONTRAST  Technique:  Multidetector CT imaging of the chest was performed following the  standard protocol during bolus administration of intravenous contrast.  Contrast: 75mL OMNIPAQUE IOHEXOL 300 MG/ML  SOLN  Comparison: None.  Findings: Mild emphysema is noted at the upper lung lobes.  The lungs are otherwise clear.  There is no evidence of focal consolidation, pleural effusion or pneumothorax. No masses are identified.  Diffuse coronary artery calcifications are seen.  A 1.1 cm right paratracheal node is seen; aortopulmonary window nodes are borderline normal in size.  The mediastinum is otherwise unremarkable in appearance.  No pericardial effusion is identified. The great vessels are grossly unremarkable in appearance.  Air within the pulmonary outflow tract is transient in nature. Scattered calcification is noted along the thoracic and upper abdominal aorta.  No axillary lymphadenopathy is seen.  The visualized portions of the thyroid gland are unremarkable in appearance, aside from mild vague hypodensities, without a dominant mass.  The visualized portions of the liver and spleen are unremarkable.  No acute osseous abnormalities are identified.  IMPRESSION:  1.  Mild emphysema noted at the upper lung lobes.  Lungs otherwise clear. 2.  Diffuse coronary artery calcifications seen. 3.  1.1  cm right paratracheal node, nonspecific in appearance. 4.  Scattered calcification along the thoracic and upper abdominal aorta.   Original Report Authenticated By: Tonia Ghent, M.D.    Dg Swallowing Func-speech Pathology  12/24/2011  Riley Nearing [place], CCC-SLP     12/24/2011  2:47 PM Objective Swallowing Evaluation: Modified Barium Swallowing Study   Patient Details  Name: David [name] MRN: [id number] Date of Birth: 20-Feb-1940  Today's Date: 12/24/2011 Time: 1330-1400 SLP Time Calculation (min): 30 min  Past Medical History:  Past Medical History  Diagnosis Date  . Community acquired pneumonia     Right middle lobe community-acquired pneumonia  . Toxic metabolic encephalopathy   . Chronic atrial flutter    . Tobacco abuse   . History of coronary artery disease   . Ischemic cardiomyopathy   . History of prostate cancer   . Chronic obstructive pulmonary disease     with mild bronchospasm, improved  . Hypertension   . History of alcohol abuse     History of prior heavy alcohol abuse  . History of depression   . Altered mental status 2012   . Noncompliance   . Depression   . Ulcer   . Hx of migraines   . Hyperlipidemia    Past Surgical History:  Past Surgical History  Procedure Date  . Appendectomy    HPI:  This is a 71 year old male that had been asked to admit for  pneumonia. Patient is awake alert, however, unable to get any  detailed information as patient's short of breath and difficult  to understand. Currently patient not very hypoxic, with a normal  chest x-ray the patient is wheezing and quite rhonchorous.  Patient lives in a independent living facility. He does smoke and  he does have COPD. 2007 Cervical spine shows cervical  osteophytes.      Assessment / Plan / Recommendation Clinical Impression  Dysphagia Diagnosis: Mild pharyngeal phase dysphagia Clinical impression: Pt presents with a mild pharyngeal phase  dysphagia with trace silent penetration of all consistencies into  the laryngeal vestibule but not touching or passing the cords. Pt  with a slight dleay in initiation and mild weakness of the  hyolaryngeal mechanism. This results in decreased closure of the  airway during the swallow with trace pnetration, particularly  with puree consistency. Do not feel that this very mild dysphagia  is a contributor to pts respiratory impairement. However do feel  pt would benefit from compensatory strategies and f/u therapy to  reduce penetration of liquids and solids. A chin tuck and  intermittent throat clear would reduce risk of aspiration.  Recommend pt downgrade to a dys 3 (mechanical soft diet) with  thin liquids and strategies and precautions listed below.     Treatment Recommendation  Therapy as outlined in  treatment plan below    Diet Recommendation Dysphagia 3 (Mechanical Soft);Thin liquid   Liquid Administration via: Cup;Straw Medication Administration: Crushed with puree Supervision: Patient able to self feed;Full supervision/cueing  for compensatory strategies Compensations: Slow rate;Small sips/bites;Clear throat  intermittently Postural Changes and/or Swallow Maneuvers: Seated upright 90  degrees;Chin tuck;Upright 30-60 min after meal    Other  Recommendations Oral Care Recommendations: Oral care BID   Follow Up Recommendations  Skilled Nursing facility    Frequency and Duration min 2x/week  2 weeks   Pertinent Vitals/Pain NA    SLP Swallow Goals Patient will consume recommended diet without observed clinical  signs of aspiration with: Supervision/safety Patient will utilize recommended strategies during  swallow to  increase swallowing safety with: Minimal cueing Goal #3: Pt will complete pharyngeal strengthening exercises x10  with min verbal cues from SLP   General HPI: This is a 71 year old male that had been asked to  admit for pneumonia. Patient is awake alert, however, unable to  get any detailed information as patient's short of breath and  difficult to understand. Currently patient not very hypoxic, with  a normal chest x-ray the patient is wheezing and quite  rhonchorous. Patient lives in a independent living facility. He  does smoke and he does have COPD. 2007 Cervical spine shows  cervical osteophytes.  Type of Study: Modified Barium Swallowing Study Temperature Spikes Noted: No Behavior/Cognition: Alert;Confused Oral Cavity - Dentition: Poor condition;Missing  dentition;Edentulous Self-Feeding Abilities: Able to feed self Patient Positioning: Upright in chair Baseline Vocal Quality: Clear Volitional Cough: Congested Volitional Swallow: Able to elicit Anatomy: Within functional limits (Osteophytes do not impact  function) Pharyngeal Secretions: Not observed secondary MBS    Reason for Referral      Oral Phase Oral Preparation/Oral Phase Oral Phase: WFL   Pharyngeal Phase Pharyngeal Phase Pharyngeal Phase: Impaired Pharyngeal - Thin Pharyngeal - Thin Cup: Premature spillage to pyriform  sinuses;Penetration/Aspiration during swallow;Reduced  airway/laryngeal closure;Reduced anterior laryngeal  mobility;Reduced epiglottic inversion;Reduced laryngeal elevation Penetration/Aspiration details (thin cup): Material enters  airway, remains ABOVE vocal cords and not ejected out;Material  enters airway, remains ABOVE vocal cords then ejected  out;Material does not enter airway Pharyngeal - Thin Straw: Premature spillage to pyriform  sinuses;Penetration/Aspiration during swallow;Reduced  airway/laryngeal closure;Reduced anterior laryngeal  mobility;Reduced epiglottic inversion;Reduced laryngeal elevation Penetration/Aspiration details (thin straw): Material enters  airway, remains ABOVE vocal cords and not ejected out;Material  enters airway, remains ABOVE vocal cords then ejected  out;Material does not enter airway Pharyngeal - Solids Pharyngeal - Puree: Premature spillage to pyriform  sinuses;Penetration/Aspiration during swallow;Reduced  airway/laryngeal closure;Reduced anterior laryngeal  mobility;Reduced epiglottic inversion;Reduced laryngeal  elevation;Pharyngeal residue - pyriform sinuses Penetration/Aspiration details (puree): Material enters airway,  remains ABOVE vocal cords and not ejected out Pharyngeal - Mechanical Soft: Premature spillage to pyriform  sinuses;Penetration/Aspiration during swallow;Reduced  airway/laryngeal closure;Reduced anterior laryngeal  mobility;Reduced epiglottic inversion;Reduced laryngeal  elevation;Pharyngeal residue - pyriform sinuses Penetration/Aspiration details (mechanical soft): Material enters  airway, remains ABOVE vocal cords and not ejected out  Cervical Esophageal Phase    GO             Harlon Ditty, MA CCC-SLP [phone]  [place], Riley Nearing 12/24/2011, 2:46 PM       PHYSICAL EXAM General: elderly, well nourished, in no acute distress. Head: Normocephalic, atraumatic, sclera non-icteric, no xanthomas, nares are without discharge. Poor dentition Neck: Negative for carotid bruits. JVD not elevated. Lungs: Bilateral rhonchi. Breathing is unlabored. Heart: IRRR, tachy, S1 S2 without murmurs, rubs, or gallops.  Abdomen: Soft, non-tender, non-distended with normoactive bowel sounds. No hepatomegaly. No rebound/guarding. No obvious abdominal masses. Msk:  Strength and tone appears normal for age. Extremities: No clubbing, cyanosis or edema.  Distal pedal pulses are 2+ and equal bilaterally. Neuro: Alert and oriented X 3. Moves all extremities spontaneously. Psych:  Responds to questions appropriately with a flat affect.  ASSESSMENT AND PLAN: 1. Atrial flutter chronic- now with RVR exacerbated by acute illness. Carvedilol dose increased yesterday. Titration limited by hypotension. Will digitalize today. 2. LV thrombus. Follow up contrast Echo today. On IV heparin until INR therapeutic. 3. Ischemic cardiomyopathy. EF 20-25%. On carvediolol. Not able to start ACEi or ARB due to hypotension. 4.  CAD with known RCA occlusion. Moderate intermediate stenosis. Medical management. 5. PNA- per primary team 6. Tobacco abuse. Counseled on smoking cessation.  Principal Problem:  *Dyspnea Active Problems:  Atrial flutter  CAD (coronary artery disease)  Ischemic cardiomyopathy  Tobacco abuse  HTN (hypertension)  Unspecified disorder of thyroid  Healthcare-associated pneumonia  Chronic systolic congestive heart failure, NYHA class 3  Dehydration  Metabolic encephalopathy  COPD, mild  Thrombocytopenia  Warfarin anticoagulation    Signed, Surya Schroeter [place] MD,FACC 12/26/2011 8:37 AM

## 2011-12-27 ENCOUNTER — Encounter (HOSPITAL_COMMUNITY): Payer: Self-pay | Admitting: Physician Assistant

## 2011-12-27 DIAGNOSIS — G9341 Metabolic encephalopathy: Secondary | ICD-10-CM

## 2011-12-27 DIAGNOSIS — I517 Cardiomegaly: Secondary | ICD-10-CM

## 2011-12-27 LAB — CBC
Hemoglobin: 13.9 g/dL (ref 13.0–17.0)
MCV: 93.3 fL (ref 78.0–100.0)
Platelets: 142 10*3/uL — ABNORMAL LOW (ref 150–400)
RBC: 4.45 MIL/uL (ref 4.22–5.81)
WBC: 6.3 10*3/uL (ref 4.0–10.5)

## 2011-12-27 LAB — RPR TITER: RPR Titer: 1:2 {titer} — AB

## 2011-12-27 LAB — HEPARIN LEVEL (UNFRACTIONATED): Heparin Unfractionated: 0.7 IU/mL (ref 0.30–0.70)

## 2011-12-27 LAB — RPR: RPR Ser Ql: REACTIVE — AB

## 2011-12-27 LAB — PROTIME-INR: INR: 2.24 — ABNORMAL HIGH (ref 0.00–1.49)

## 2011-12-27 MED ORDER — IPRATROPIUM BROMIDE 0.02 % IN SOLN
0.5000 mg | Freq: Two times a day (BID) | RESPIRATORY_TRACT | Status: DC
  Administered 2011-12-28 – 2011-12-31 (×7): 0.5 mg via RESPIRATORY_TRACT
  Filled 2011-12-27 (×7): qty 2.5

## 2011-12-27 MED ORDER — IPRATROPIUM BROMIDE 0.02 % IN SOLN
0.5000 mg | RESPIRATORY_TRACT | Status: DC | PRN
  Filled 2011-12-27: qty 2.5

## 2011-12-27 MED ORDER — WARFARIN SODIUM 10 MG PO TABS
10.0000 mg | ORAL_TABLET | Freq: Once | ORAL | Status: AC
  Administered 2011-12-27: 10 mg via ORAL
  Filled 2011-12-27: qty 1

## 2011-12-27 MED ORDER — IPRATROPIUM BROMIDE 0.02 % IN SOLN: 0.5000 mg | Freq: Two times a day (BID) | RESPIRATORY_TRACT | Status: DC

## 2011-12-27 MED ORDER — ALBUTEROL SULFATE (5 MG/ML) 0.5% IN NEBU: 2.5000 mg | INHALATION_SOLUTION | Freq: Four times a day (QID) | RESPIRATORY_TRACT | Status: DC

## 2011-12-27 MED ORDER — ATORVASTATIN CALCIUM 40 MG PO TABS
40.0000 mg | ORAL_TABLET | Freq: Every day | ORAL | Status: DC
  Administered 2011-12-27 – 2011-12-31 (×5): 40 mg via ORAL
  Filled 2011-12-27 (×5): qty 1

## 2011-12-27 MED ORDER — LEVOFLOXACIN 750 MG PO TABS
750.0000 mg | ORAL_TABLET | Freq: Every day | ORAL | Status: DC
  Administered 2011-12-27 – 2011-12-31 (×5): 750 mg via ORAL
  Filled 2011-12-27 (×5): qty 1

## 2011-12-27 MED ORDER — ALBUTEROL SULFATE (5 MG/ML) 0.5% IN NEBU: 2.5000 mg | INHALATION_SOLUTION | RESPIRATORY_TRACT | Status: DC | PRN

## 2011-12-27 MED ORDER — LACTULOSE 10 GM/15ML PO SOLN
30.0000 g | Freq: Every day | ORAL | Status: DC
  Administered 2011-12-27 – 2011-12-31 (×5): 30 g via ORAL
  Filled 2011-12-27 (×5): qty 45

## 2011-12-27 MED ORDER — FOLIC ACID 1 MG PO TABS
1.0000 mg | ORAL_TABLET | Freq: Every day | ORAL | Status: DC
  Administered 2011-12-27 – 2011-12-31 (×5): 1 mg via ORAL
  Filled 2011-12-27 (×5): qty 1

## 2011-12-27 MED ORDER — VITAMIN B-1 100 MG PO TABS
100.0000 mg | ORAL_TABLET | Freq: Every day | ORAL | Status: DC
  Administered 2011-12-27: 100 mg via ORAL
  Filled 2011-12-27 (×2): qty 1

## 2011-12-27 MED ORDER — ALBUTEROL SULFATE (5 MG/ML) 0.5% IN NEBU
2.5000 mg | INHALATION_SOLUTION | Freq: Two times a day (BID) | RESPIRATORY_TRACT | Status: DC
  Administered 2011-12-28 – 2011-12-31 (×7): 2.5 mg via RESPIRATORY_TRACT
  Filled 2011-12-27 (×8): qty 0.5

## 2011-12-27 NOTE — Progress Notes (Signed)
  Echocardiogram 2D Echocardiogram Limited with Definity has been performed.  David [name] [name] 12/27/2011, 11:54 AM

## 2011-12-27 NOTE — Progress Notes (Signed)
Clinical Social Worker staffed case in progression. MD to order PT/OT notes to determine pt's level of need.  CSW attempted to submit for [facility] in case pt will require SNF at [facility] stating pt's demographic information does not match.  CSW confirmed pt's date of birth and social security number with facility and pt.  CSW submitted appropriate clinical information to [facility] to correct any inappropriate demographic information.  CSW reviewed potential need for SNF with pt; pt agreeable.  CSW to continue to follow and assist as needed.   Angelia Mould, MSW, [place] [phone]

## 2011-12-27 NOTE — Progress Notes (Signed)
Cardiology Progress Note Patient Name: David [name] Date of Encounter: 12/27/2011, 8:00 AM     Subjective  Patient still with elevated rates. Is confused today. Denies chest pain or sob.    Objective   Telemetry: atrial flutter 70-140s  Medications: . albuterol  2.5 mg Nebulization Q6H  . ipratropium  0.5 mg Nebulization Q6H  . carvedilol  3.125 mg Oral Once  . carvedilol  6.25 mg Oral BID WC  . [COMPLETED] digoxin  0.25 mg Intravenous Q8H  . digoxin  0.125 mg Oral Daily  . DULoxetine  60 mg Oral Daily  . guaiFENesin  600 mg Oral BID  . [EXPIRED] influenza  inactive virus vaccine  0.5 mL Intramuscular Tomorrow-1000  . levofloxacin (LEVAQUIN) IV  750 mg Intravenous Q24H  . mirtazapine  15 mg Oral QHS  . paliperidone  6 mg Oral Daily  . [EXPIRED] pneumococcal 23 valent vaccine  0.5 mL Intramuscular Tomorrow-1000  . QUEtiapine  50 mg Oral BID  . sodium chloride  3 mL Intravenous Q12H  . topiramate  25 mg Oral QHS  . Warfarin - Pharmacist Dosing Inpatient   Does not apply q1800   . sodium chloride 10 mL/hr (12/25/11 1506)  . heparin 850 Units/hr (12/27/11 0218)    Physical Exam: Temp:  [97.5 F (36.4 C)-98.8 F (37.1 C)] 98.1 F (36.7 C) (12/06 0700) Pulse Rate:  [67-144] 131  (12/06 0636) Resp:  [18-22] 18  (12/06 0636) BP: (98-123)/(58-77) 98/58 mmHg (12/06 0636) SpO2:  [94 %-100 %] 97 % (12/06 0636)  General: Elderly black male, in no acute distress. Head: Normocephalic, atraumatic, sclera non-icteric, nares are without discharge.  Neck: Supple. Negative for carotid bruits or JVD Lungs: Coarse with scattered wheezes. Breathing is unlabored. Heart: Irregularly irregular S1 S2 without murmurs, rubs, or gallops.  Abdomen: Soft, non-tender, non-distended with normoactive bowel sounds. No rebound/guarding. No obvious abdominal masses. Msk:  Strength and tone appear normal for age. Extremities: No edema. No clubbing or cyanosis. Distal pedal pulses are intact and  equal bilaterally. Neuro: Alert and oriented to person and place. Moves all extremities spontaneously. Psych:  Responds to questions appropriately with a normal affect.   Intake/Output Summary (Last 24 hours) at 12/27/11 0800 Last data filed at 12/27/11 0600  Gross per 24 hour  Intake    575 ml  Output   1975 ml  Net  -1400 ml    Labs:  [facility] 12/25/11 0440 12/24/11 1033  NA 139 137  K 4.0 4.0  CL 106 103  CO2 26 26  GLUCOSE 102* 167*  BUN 8 9  CREATININE 0.62 0.56  CALCIUM 9.1 8.9  MG -- --  PHOS -- --    Basename 12/24/11 1033  AST 28  ALT 18  ALKPHOS 56  BILITOT 0.6  PROT 6.5  ALBUMIN 3.1*   Basename 12/27/11 0550 12/26/11 0900  WBC 6.3 6.6  NEUTROABS -- --  HGB 13.9 14.0  HCT 41.5 41.5  MCV 93.3 94.5  PLT 142* 124*   Basename 12/24/11 1033  TSH 0.242*  T4TOTAL --  T3FREE --  THYROIDAB --     12/27/2011 05:50  Prothrombin Time 23.8 (H)  INR 2.24 (H)   Radiology/Studies:   12/24/11 - Echo Study Conclusions: - Left ventricle: The cavity size was normal. Systolic function was severely reduced. The estimated ejection fraction was in the range of 20% to 25%. Diffuse hypokinesis. Atrial flutter limits allow evaluation of LV diastolic function. Cannot exclude  apical thrombus. - Mitral valve: Mild regurgitation. - Left atrium: The atrium was mildly dilated. - Right ventricle: Systolic function was reduced. - Right atrium: The atrium was mildly dilated.  12/23/2011 - CHEST - 2 VIEW  Findings: Cardiomediastinal silhouette is stable.  No acute infiltrate or pleural effusion.  No pulmonary edema.  Bony thorax is stable.  IMPRESSION: No active disease.   12/24/2011  -  CT CHEST WITH CONTRAST  Findings: Mild emphysema is noted at the upper lung lobes.  The lungs are otherwise clear.  There is no evidence of focal consolidation, pleural effusion or pneumothorax. No masses are identified.  Diffuse coronary artery calcifications are seen.  A 1.1 cm right  paratracheal node is seen; aortopulmonary window nodes are borderline normal in size.  The mediastinum is otherwise unremarkable in appearance.  No pericardial effusion is identified. The great vessels are grossly unremarkable in appearance.  Air within the pulmonary outflow tract is transient in nature. Scattered calcification is noted along the thoracic and upper abdominal aorta.  No axillary lymphadenopathy is seen.  The visualized portions of the thyroid gland are unremarkable in appearance, aside from mild vague hypodensities, without a dominant mass.  The visualized portions of the liver and spleen are unremarkable.  No acute osseous abnormalities are identified.  IMPRESSION:  1.  Mild emphysema noted at the upper lung lobes.  Lungs otherwise clear. 2.  Diffuse coronary artery calcifications seen. 3.  1.1 cm right paratracheal node, nonspecific in appearance. 4.  Scattered calcification along the thoracic and upper abdominal aorta.     12/26/2011  - MRI HEAD WITHOUT CONTRAST  Findings: Motion degraded exam.  No acute infarct.  No intracranial hemorrhage.  Prominent small vessel disease type changes.  No intracranial mass lesion detected on this unenhanced exam.  Global atrophy without hydrocephalus.  Major intracranial vascular structures are patent.  Left vertebral artery appears congenitally small and there may be superimposed atherosclerotic type changes with narrowing distally.  Cervical medullary junction, pituitary region, pineal region and orbital structures unremarkable.  Minimal paranasal sinus mucosal thickening.  IMPRESSION: Motion degraded exam.  No acute infarct.  Prominent small vessel disease type changes.  Please see above.      Assessment and Plan   1. Intermittent Confusion: Has had waxing and waning episodes of confusion since hospitalization. MRI without acute intracranial findings. Afebrile without leukocytosis. Unsure of etiology. ? R/t h/o ETOH abuse. 2. Atrial flutter w/ rvr: H/o  chronic a.flutter, now w/ RVR in the setting of acute illness. Continues to have elevated rates despite increased carvedilol dose and digoxin. Consider switching to Toprol XL BID for better rate control and in light of lung dz. Cont coumadin. 3. LV thrombus: Repeat echo with contrast to be completed today. On IV heparin and coumadin. INR > 2.0 for 24hrs. MD advise on stopping IV heparin today. 4. Ischemic Cardiomyopathy: EF 20-25%. Euvolemic on exam. Cont BB. Initiation of ACEI/ARB limited by soft BPs. 5. Coronary Artery Disease: Stable. Medical management. Resume home statin. 6. COPD w/ acute bronchitis: Management per primary team 7. Tobacco Abuse: Counseled on smoking cessation.   Signed, HOPE, JESSICA PA-C  Patient seen and examined and history reviewed. Agree with above findings and plan. Patient;'s ventricular rate has improved significantly with dig load. Now HR 70's. BP stable. Continue carvedilol and dig. Repeat Echo with contrast is pending. INRs are therapeutic. We can stop IV heparin at this point.  Theron Arista [facility] 12/27/2011 10:19 AM

## 2011-12-27 NOTE — Progress Notes (Addendum)
Pt had 7 beat run for VT.  Asymptomatic.  VSS. Cardiology notified.  No new orders.  Will continue to monitor. Emerald Gehres, [facility]

## 2011-12-27 NOTE — Progress Notes (Signed)
ANTICOAGULATION CONSULT NOTE - Follow Up Consult  Pharmacy Consult for Heparin>>Coumadin, Levaquin Indication: atrial fibrillation and suspected apical thrombus, AECOPD  Allergies  Allergen Reactions  . Oat Flour (Oat)     Patient Measurements: Height: 6' (182.9 cm) Weight: 143 lb 14.4 oz (65.273 kg) IBW/kg (Calculated) : 77.6  Heparin Dosing Weight: 64.4 Kg  Vital Signs: Temp: 98.1 F (36.7 C) (12/06 0700) BP: 98/58 mmHg (12/06 0636) Pulse Rate: 131  (12/06 0636)  Labs:  Basename 12/27/11 0550 12/26/11 0900 12/25/11 2324 12/25/11 0440 12/24/11 1033  HGB 13.9 14.0 -- -- --  HCT 41.5 41.5 -- 39.5 --  PLT 142* 124* -- 131* --  APTT -- -- -- -- --  LABPROT 23.8* 24.6* -- 18.7* --  INR 2.24* 2.34* -- 1.62* --  HEPARINUNFRC 0.70 0.46 0.87* -- --  CREATININE -- -- -- 0.62 0.56  CKTOTAL -- -- -- -- --  CKMB -- -- -- -- --  TROPONINI -- -- -- -- --   Lab Results  Component Value Date   WBC 6.3 12/27/2011    Estimated Creatinine Clearance: 78.2 ml/min (by C-G formula based on Cr of 0.62).  Assessment: David [name] continues on Coumadin/Heparin for aflutter and possible apical thrombus - INR therapeutic (x 2 days), H/H stable, PTLC slowly incr, now 142, no bleeding noted, also on IV Heparin which is now within goal. Home Coumadin dose 5 mg daily except 7.5 on Sun. Noted Coumadin dose not charted 12/5. Patient is also on Levaquin for AECOPD. Noted he remains afebrile, WBC is nml. Levaquin may potentiate INR.  Zosyn 12/3>>12/5 Vanc 12/3>>12/5 Levaquin 12/3>>  12/2 UC>> neg 12/2, 12/3 BC X 4>>ngtd 12/3: MRSA pcr: neg  Goal of Therapy:  INR 2-3 Heparin level 0.3-0.7 units/ml Monitor platelets by anticoagulation protocol: Yes   Plan:  - d/c heparin and pending heparin levels - Coumadin 10 mg x 1 today- suspect Coumadin not given 12/5 - Will f/up daily PT/INR - Continue Levaquin 750mg  daily, consider d/c on 12/9 (completes 7-day course).  Thanks, Aayansh Codispoti K. Allena Katz,  PharmD, BCPS.  Clinical Pharmacist Pager [phone]. 12/27/2011 9:20 AM

## 2011-12-27 NOTE — Progress Notes (Signed)
Patient seen and examined this morning. Appears confused off and on. Agree for possibility of wernicke's given patient does consume etoh ( not able to quantify). MRI negative for acute findings Follow B12, folate and RPR HR better this afternoon. cardiology recommend continuing coreg and digoxin. INR therapeutic now on coumadin ,. Heparin drip dced Needs SNF placement

## 2011-12-27 NOTE — Progress Notes (Signed)
Pt ambulated 300 ft without assistance.  O2 sats 98-100% on room air while ambulating.  Macil Crady, [facility]

## 2011-12-27 NOTE — Progress Notes (Signed)
[facility] Progress Note    David [name] ZOX:[id number] DOB: 1940/05/25 DOA: 12/23/2011 PCP: Mikki Harbor    Assessment/Plan: Principal Problem:  Confusion and dysarthia Uncertain etiology.  Patient was found to be confused and dysarthic on 12/4 and again on 12/6.  ? 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 MRI brain was ordered to rule out stroke.  It was negative for acute abnormality.  Check serum folate, B12, RPR. start folate and thiamine.  Atrial flutter Rate has been variable and tachycardic up to 150s [facility] Consulted for A Flutter and possible apical thrombus on 12/4.   They increased his carvedilol and started him on digoxin.   Beta Blockade has to be limited as the patient's BP is soft.   He continues to have rates in the 140s.  Will defer to Cards for further recommendations.  Possible LV thrombus noted on echocardiogram Cards ordered follow up echo with contrast to further evaluate  Acute Bronchitis in setting of COPD Negative x-ray findings for pneumonia.  Fever resolved. Continue empiric antibiotics (D/C vanc/zosyn and continue levaquin) supportive care with nebulizer and oxygen if needed. Patient with improved exam but still has bilateral Rales. Steroids were initially ordered but then discontinued due to worsening confusion. Check ambulating oxygen saturation today.  Formal swallowing evaluation by speech therapy- negative  Thrombocytopenia Improving now that antibiotics have been de-escalated.  Antibiotics have been de-escalated. Platelets still declining slightly. Will continue to monitor.  Ischemic cardiomyopathy/Chronic systolic congestive heart failure, NYHA class 3 (EF 25%) Echocardiogram done this admission.  The estimated ejection fraction was in the range of 20% to 25%. Diffuse hypokinesis. Atrial flutter limits allow evaluation of LV diastolic function. Cannot exclude apical thrombus. On carvediolol. Not able  to start ACEi or ARB due to hypotension.  CAD (coronary artery disease) Currently without any complaints of chest pain Initial EKG nonischemic   HTN (hypertension) Blood pressure soft.  Medications being directly by Cards due to tachycardia.  Unspecified disorder of thyroid TSH is again suppressed (0.242).  Free T4 is in low normal range.  Warfarin anticoagulation Warfarin being managed by pharmacy.  Patient was started on IV heparin by cardiology as 2D echo showed possible apical thrombus.  Deconditioning PT/OT consultation ordered.   Will this patient need SNF?  (currently in ALF) Ambulate patient  DVT prophylaxis: On IV Heparin. Code Status: DO NOT RESUSCITATE Family Communication: Spoke only to patient Disposition Plan: Inpatient  Consultants: [facility]  Procedures: 2 D Echo Results: - Left ventricle: The cavity size was normal. Systolic function was severely reduced. The estimated ejection fraction was in the range of 20% to 25%. Diffuse hypokinesis. Atrial flutter limits allow evaluation of LV diastolic function. Cannot exclude apical thrombus. - Mitral valve: Mild regurgitation. - Left atrium: The atrium was mildly dilated. - Right ventricle: Systolic function was reduced. - Right atrium: The atrium was mildly dilated.    Antibiotics: Zosyn 12/2 >>>12/4 Vancomycin 12/3 >12/4 Levaquin 12/3 >>>  HPI/Subjective: Brief narrative: 71 year old male patient of an assisted living facility normally quite independent and alert and oriented according to staff. He was sent to the emergency department because of apparent shortness of breath and difficulty in staff understanding what he was saying. He is not hypoxic at presentation and his chest x-ray confirmed no focal findings although on exam he was wheezing and quite rhonchorous. Patient apparently continues to smoke he carries a diagnosis of COPD. Influenza panel was negative.   Reports his breathing is better  today.  Not having chest pain.  Seems confused again today.  Difficult to understand once again.   Objective: Blood pressure 98/58, pulse 131, temperature 98.1 F (36.7 C), temperature source Oral, resp. rate 18, height 6' (1.829 m), weight 65.273 kg (143 lb 14.4 oz), SpO2 97.00%.  Intake/Output Summary (Last 24 hours) at 12/27/11 0843 Last data filed at 12/27/11 5621  Gross per 24 hour  Intake    575 ml  Output   2175 ml  Net  -1600 ml     Exam: General: Awake,lying in bed, NAD Head: Atraumatic normocephalic, poor dentition Neck: Supple, no obvious thyromegaly. Chest: improving, but coarse breath sounds.  No accessory muscle movement. Cardiovascular: Tachycardic and irregular, without murmurs rubs or gallops. Extremities warm, no lower extremity edema Abdomen: Thin, soft, nontender, nondistended, no masses, positive bowel sounds Psych:  Seems confused again today.  Does not appropriately answer questions.     Data Reviewed: Basic Metabolic Panel:  Lab 12/25/11 3086 12/24/11 1033 12/24/11 0523 12/23/11 2149  NA 139 137 138 135  K 4.0 4.0 3.7 4.8  CL 106 103 105 100  CO2 26 26 27 21   GLUCOSE 102* 167* 110* 138*  BUN 8 9 10 14   CREATININE 0.62 0.56 0.54 0.66  CALCIUM 9.1 8.9 8.5 9.5  MG -- -- -- --  PHOS -- -- -- --   Liver Function Tests:  Lab 12/24/11 1033 12/23/11 2149  AST 28 34  ALT 18 18  ALKPHOS 56 63  BILITOT 0.6 0.7  PROT 6.5 7.6  ALBUMIN 3.1* 3.7    Lab 12/24/11 1033  AMMONIA 31   CBC:  Lab 12/27/11 0550 12/26/11 0900 12/25/11 0440 12/24/11 0523 12/23/11 2240  WBC 6.3 6.6 8.8 8.2 9.5  NEUTROABS -- -- -- -- 7.0  HGB 13.9 14.0 13.2 13.9 15.1  HCT 41.5 41.5 39.5 41.0 43.1  MCV 93.3 94.5 93.8 93.8 92.9  PLT 142* 124* 131* 134* 137*   CBG:  Lab 12/24/11 0259  GLUCAP 149*    Recent Results (from the past 240 hour(s))  URINE CULTURE     Status: Normal   Collection Time   12/23/11  8:54 PM      Component Value Range Status Comment   Specimen  Description URINE, CATHETERIZED   Final    Special Requests NONE   Final    Culture  Setup Time 12/23/2011 21:16   Final    Colony Count NO GROWTH   Final    Culture NO GROWTH   Final    Report Status 12/25/2011 FINAL   Final   CULTURE, BLOOD (ROUTINE X 2)     Status: Normal (Preliminary result)   Collection Time   12/23/11  9:54 PM      Component Value Range Status Comment   Specimen Description BLOOD RIGHT HAND   Final    Special Requests BOTTLES DRAWN AEROBIC ONLY 3CC   Final    Culture  Setup Time 12/24/2011 05:50   Final    Culture     Final    Value:        BLOOD CULTURE RECEIVED NO GROWTH TO DATE CULTURE WILL BE HELD FOR 5 DAYS BEFORE ISSUING A FINAL NEGATIVE REPORT   Report Status PENDING   Incomplete   CULTURE, BLOOD (ROUTINE X 2)     Status: Normal (Preliminary result)   Collection Time   12/23/11 10:32 PM      Component Value Range Status Comment   Specimen Description BLOOD RIGHT  HAND   Final    Special Requests BOTTLES DRAWN AEROBIC ONLY 10CC   Final    Culture  Setup Time 12/24/2011 05:50   Final    Culture     Final    Value:        BLOOD CULTURE RECEIVED NO GROWTH TO DATE CULTURE WILL BE HELD FOR 5 DAYS BEFORE ISSUING A FINAL NEGATIVE REPORT   Report Status PENDING   Incomplete   CULTURE, BLOOD (ROUTINE X 2)     Status: Normal (Preliminary result)   Collection Time   12/24/11  1:30 AM      Component Value Range Status Comment   Specimen Description BLOOD RIGHT HAND   Final    Special Requests BOTTLES DRAWN AEROBIC ONLY 3CC   Final    Culture  Setup Time 12/24/2011 08:57   Final    Culture     Final    Value:        BLOOD CULTURE RECEIVED NO GROWTH TO DATE CULTURE WILL BE HELD FOR 5 DAYS BEFORE ISSUING A FINAL NEGATIVE REPORT   Report Status PENDING   Incomplete   CULTURE, BLOOD (ROUTINE X 2)     Status: Normal (Preliminary result)   Collection Time   12/24/11  1:43 AM      Component Value Range Status Comment   Specimen Description BLOOD RIGHT ARM   Final     Special Requests BOTTLES DRAWN AEROBIC ONLY 3CC   Final    Culture  Setup Time 12/24/2011 08:57   Final    Culture     Final    Value:        BLOOD CULTURE RECEIVED NO GROWTH TO DATE CULTURE WILL BE HELD FOR 5 DAYS BEFORE ISSUING A FINAL NEGATIVE REPORT   Report Status PENDING   Incomplete   MRSA PCR SCREENING     Status: Normal   Collection Time   12/24/11  3:00 AM      Component Value Range Status Comment   MRSA by PCR NEGATIVE  NEGATIVE Final   MRSA PCR SCREENING     Status: Normal   Collection Time   12/25/11 12:27 PM      Component Value Range Status Comment   MRSA by PCR NEGATIVE  NEGATIVE Final      Studies:  Recent x-ray studies have been reviewed in detail by the Attending Physician  Scheduled Meds:  Reviewed in detail by the Attending Physician   Algis Downs, PA-C. [facility] [phone]  On-Call/Text Page:      [email or web address]      password [facility]

## 2011-12-28 DIAGNOSIS — J449 Chronic obstructive pulmonary disease, unspecified: Secondary | ICD-10-CM

## 2011-12-28 LAB — PROTIME-INR: Prothrombin Time: 24.6 seconds — ABNORMAL HIGH (ref 11.6–15.2)

## 2011-12-28 LAB — CBC
HCT: 44.5 % (ref 39.0–52.0)
MCHC: 34.6 g/dL (ref 30.0–36.0)
MCV: 92.7 fL (ref 78.0–100.0)
RDW: 13.1 % (ref 11.5–15.5)

## 2011-12-28 MED ORDER — VITAMIN B-1 100 MG PO TABS
100.0000 mg | ORAL_TABLET | Freq: Every day | ORAL | Status: DC
  Administered 2011-12-31: 100 mg via ORAL
  Filled 2011-12-28: qty 1

## 2011-12-28 MED ORDER — WARFARIN SODIUM 7.5 MG PO TABS
7.5000 mg | ORAL_TABLET | Freq: Once | ORAL | Status: AC
  Administered 2011-12-28: 7.5 mg via ORAL
  Filled 2011-12-28: qty 1

## 2011-12-28 MED ORDER — METOPROLOL TARTRATE 50 MG PO TABS
50.0000 mg | ORAL_TABLET | Freq: Two times a day (BID) | ORAL | Status: DC
  Administered 2011-12-28 – 2011-12-29 (×4): 50 mg via ORAL
  Filled 2011-12-28 (×6): qty 1

## 2011-12-28 MED ORDER — THIAMINE HCL 100 MG/ML IJ SOLN
Freq: Three times a day (TID) | INTRAVENOUS | Status: AC
  Administered 2011-12-28 – 2011-12-30 (×5): via INTRAVENOUS
  Filled 2011-12-28 (×6): qty 5

## 2011-12-28 MED ORDER — THIAMINE HCL 100 MG/ML IJ SOLN
500.0000 mg | Freq: Three times a day (TID) | INTRAMUSCULAR | Status: DC
Start: 2011-12-28 — End: 2011-12-28
  Filled 2011-12-28 (×4): qty 5

## 2011-12-28 NOTE — Evaluation (Signed)
Physical Therapy Evaluation Patient Details Name: David [name] MRN: [id number] DOB: Feb 03, 1940 Today's Date: 12/28/2011 Time: 6063-0160 PT Time Calculation (min): 16 min  PT Assessment / Plan / Recommendation Clinical Impression  71 year old male patient of an assisted living facility normally quite independent and alert and oriented according to staff. He was sent to the emergency department because of apparent shortness of breath and difficulty in staff understanding what he was saying.  Admitted for COPD exacerbation.  Presents to PT today confused with generalized weakness and balance deficits affecting his safety and independence. Will benefit physical therapy in the acute setting to maxmimize functional independence for safe d/c plan. At this time given his confusion and instability he is at risk for falls and will need 24 hour supervision on d/c. If ALF can provide this then I would recommend HHPT. If they cannot he will need ST-SNF rehab.     PT Assessment  Patient needs continued PT services    Follow Up Recommendations  Home health PT;Supervision/Assistance - 24 hour; (ST-SNF if ALF cannot provide 24 hour care)    Does the patient have the potential to tolerate intense rehabilitation      Barriers to Discharge        Equipment Recommendations  None recommended by PT    Recommendations for Other Services OT consult   Frequency Min 3X/week    Precautions / Restrictions Precautions Precautions: Fall Restrictions Weight Bearing Restrictions: No         Mobility  Bed Mobility Bed Mobility: Supine to Sit Supine to Sit: 5: Supervision Transfers Transfers: Sit to Stand;Stand to Sit Sit to Stand: With upper extremity assist;From bed;4: Min guard Stand to Sit: With upper extremity assist;To bed;4: Min guard Details for Transfer Assistance: gaurding for stability as pt came into full standing without upper extermity support Ambulation/Gait Ambulation/Gait Assistance:  4: Min guard Ambulation Distance (Feet): 250 Feet Assistive device: None Ambulation/Gait Assistance Details: cues for tall posture, gaurding with dynamic challenges as pt stumbled slightly with decreased righting reaction noted (specifically with head turns)  Gait Pattern: Narrow base of support;Decreased stride length General Gait Details: decreased step height with slight stagger that increases as he fatigues              PT Diagnosis: Abnormality of gait;Difficulty walking;Generalized weakness;Altered mental status  PT Problem List: Decreased strength;Decreased activity tolerance;Decreased balance;Decreased mobility;Decreased coordination;Decreased cognition;Decreased safety awareness;Cardiopulmonary status limiting activity PT Treatment Interventions: DME instruction;Gait training;Functional mobility training;Therapeutic activities;Therapeutic exercise;Balance training;Neuromuscular re-education;Patient/family education;Cognitive remediation   PT Goals Acute Rehab PT Goals PT Goal Formulation: With patient Time For Goal Achievement: 01/04/12 Potential to Achieve Goals: Good Pt will go Supine/Side to Sit: with modified independence PT Goal: Supine/Side to Sit - Progress: Goal set today Pt will go Sit to Supine/Side: with modified independence PT Goal: Sit to Supine/Side - Progress: Goal set today Pt will go Sit to Stand: with modified independence PT Goal: Sit to Stand - Progress: Goal set today Pt will go Stand to Sit: with modified independence PT Goal: Stand to Sit - Progress: Goal set today Pt will Transfer Bed to Chair/Chair to Bed: with modified independence PT Transfer Goal: Bed to Chair/Chair to Bed - Progress: Goal set today Pt will Ambulate: >150 feet;with modified independence;with least restrictive assistive device PT Goal: Ambulate - Progress: Goal set today Pt will Perform Home Exercise Program: Independently PT Goal: Perform Home Exercise Program - Progress: Goal  set today  Visit Information  Last PT Received On: 12/28/11 Assistance Needed: +  1    Subjective Data  Subjective: I live with my people. (unable to specify who these people were, mumbled speech, difficult to understand)  Patient Stated Goal: to go home today   Prior Functioning  Home Living Type of Home: Assisted living Home Access: Level entry Home Layout: One level Bathroom Toilet: Handicapped height Home Adaptive Equipment: None Prior Function Level of Independence: Independent (per notes in chart) Driving: No Communication Communication: Expressive difficulties (mumbled speech)    Cognition  Overall Cognitive Status: Impaired Area of Impairment: Attention;Memory;Safety/judgement;Awareness of errors;Awareness of deficits Arousal/Alertness: Awake/alert Orientation Level: Disoriented to;Place;Time;Situation Behavior During Session: WFL for tasks performed Current Attention Level: Sustained Cognition - Other Comments: slow processing, RN reports his orientation waxes and wanes    Extremity/Trunk Assessment Right Upper Extremity Assessment RUE ROM/Strength/Tone: WFL for tasks assessed Left Upper Extremity Assessment LUE ROM/Strength/Tone: WFL for tasks assessed Right Lower Extremity Assessment RLE ROM/Strength/Tone: Deficits RLE ROM/Strength/Tone Deficits: grossly 4/5 Left Lower Extremity Assessment LLE ROM/Strength/Tone: Deficits LLE ROM/Strength/Tone Deficits: grossly 4/5   Balance Balance Balance Assessed: Yes Static Standing Balance Static Standing - Balance Support: No upper extremity supported Static Standing - Level of Assistance: 5: Stand by assistance  End of Session PT - End of Session Equipment Utilized During Treatment: Gait belt Activity Tolerance: Patient tolerated treatment well Patient left: in bed;with call bell/phone within reach;with bed alarm set Nurse Communication: Mobility status  GP     [facility]EN 12/28/2011, 3:22 PM

## 2011-12-28 NOTE — Progress Notes (Addendum)
Cardiology Progress Note Patient Name: David [name] Date of Encounter: 12/28/2011, 12:28 PM     Subjective   Denies chest pain or sob. Wants to go home.    Objective   Telemetry: atrial flutter 70-130s. Rate controlled but increases with activity.  Medications: . albuterol  2.5 mg Nebulization Q6H  . ipratropium  0.5 mg Nebulization Q6H  . carvedilol  3.125 mg Oral Once  . carvedilol  6.25 mg Oral BID WC  . [COMPLETED] digoxin  0.25 mg Intravenous Q8H  . digoxin  0.125 mg Oral Daily  . DULoxetine  60 mg Oral Daily  . guaiFENesin  600 mg Oral BID  . [EXPIRED] influenza  inactive virus vaccine  0.5 mL Intramuscular Tomorrow-1000  . levofloxacin (LEVAQUIN) IV  750 mg Intravenous Q24H  . mirtazapine  15 mg Oral QHS  . paliperidone  6 mg Oral Daily  . [EXPIRED] pneumococcal 23 valent vaccine  0.5 mL Intramuscular Tomorrow-1000  . QUEtiapine  50 mg Oral BID  . sodium chloride  3 mL Intravenous Q12H  . topiramate  25 mg Oral QHS  . Warfarin - Pharmacist Dosing Inpatient   Does not apply q1800   . sodium chloride 10 mL/hr (12/25/11 1506)  . heparin 850 Units/hr (12/27/11 0218)    Physical Exam: Temp:  [97.6 F (36.4 C)-98 F (36.7 C)] 97.6 F (36.4 C) (12/07 0500) Pulse Rate:  [74-82] 74  (12/07 0500) Resp:  [17-20] 20  (12/07 0500) BP: (116-121)/(61-77) 121/77 mmHg (12/07 0500) SpO2:  [92 %-95 %] 94 % (12/07 0930)  General: Elderly black male, in no acute distress. Head: Normocephalic, atraumatic, sclera non-icteric, nares are without discharge.  Neck: Supple. Negative for carotid bruits or JVD Lungs: Coarse with scattered wheezes. Breathing is unlabored. Heart: Irregularly irregular S1 S2 without murmurs, rubs, or gallops.  Abdomen: Soft, non-tender, non-distended with normoactive bowel sounds. No rebound/guarding. No obvious abdominal masses. Msk:  Strength and tone appear normal for age. Extremities: No edema. No clubbing or cyanosis. Distal pedal pulses are  intact and equal bilaterally. Neuro: Alert and oriented to person and place. Moves all extremities spontaneously. Psych:  Responds to questions appropriately with a normal affect.  No intake or output data in the 24 hours ending 12/28/11 1228  Labs: No results found for this basename: NA:2,K:2,CL:2,CO2:2,GLUCOSE:2,BUN:2,CREATININE:2,CALCIUM:2,MG:2,PHOS:2 in the last 72 hours No results found for this basename: AST:2,ALT:2,ALKPHOS:2,BILITOT:2,PROT:2,ALBUMIN:2 in the last 72 hours  Basename 12/28/11 0528 12/27/11 0550  WBC 6.8 6.3  NEUTROABS -- --  HGB 15.4 13.9  HCT 44.5 41.5  MCV 92.7 93.3  PLT 152 142*  No results found for this basename: TSH,T4TOTAL,FREET3,T3FREE,THYROIDAB in the last 72 hours   12/27/2011 05:50  Prothrombin Time 23.8 (H)  INR 2.24 (H)   Radiology/Studies:   12/24/11 - Echo Study Conclusions: - Left ventricle: The cavity size was normal. Systolic function was severely reduced. The estimated ejection fraction was in the range of 20% to 25%. Diffuse hypokinesis. Atrial flutter limits allow evaluation of LV diastolic function. Cannot exclude apical thrombus. - Mitral valve: Mild regurgitation. - Left atrium: The atrium was mildly dilated. - Right ventricle: Systolic function was reduced. - Right atrium: The atrium was mildly dilated.  12/23/2011 - CHEST - 2 VIEW  Findings: Cardiomediastinal silhouette is stable.  No acute infiltrate or pleural effusion.  No pulmonary edema.  Bony thorax is stable.  IMPRESSION: No active disease.   12/24/2011  -  CT CHEST WITH CONTRAST  Findings: Mild emphysema  is noted at the upper lung lobes.  The lungs are otherwise clear.  There is no evidence of focal consolidation, pleural effusion or pneumothorax. No masses are identified.  Diffuse coronary artery calcifications are seen.  A 1.1 cm right paratracheal node is seen; aortopulmonary window nodes are borderline normal in size.  The mediastinum is otherwise unremarkable in appearance.   No pericardial effusion is identified. The great vessels are grossly unremarkable in appearance.  Air within the pulmonary outflow tract is transient in nature. Scattered calcification is noted along the thoracic and upper abdominal aorta.  No axillary lymphadenopathy is seen.  The visualized portions of the thyroid gland are unremarkable in appearance, aside from mild vague hypodensities, without a dominant mass.  The visualized portions of the liver and spleen are unremarkable.  No acute osseous abnormalities are identified.  IMPRESSION:  1.  Mild emphysema noted at the upper lung lobes.  Lungs otherwise clear. 2.  Diffuse coronary artery calcifications seen. 3.  1.1 cm right paratracheal node, nonspecific in appearance. 4.  Scattered calcification along the thoracic and upper abdominal aorta.     12/26/2011  - MRI HEAD WITHOUT CONTRAST  Findings: Motion degraded exam.  No acute infarct.  No intracranial hemorrhage.  Prominent small vessel disease type changes.  No intracranial mass lesion detected on this unenhanced exam.  Global atrophy without hydrocephalus.  Major intracranial vascular structures are patent.  Left vertebral artery appears congenitally small and there may be superimposed atherosclerotic type changes with narrowing distally.  Cervical medullary junction, pituitary region, pineal region and orbital structures unremarkable.  Minimal paranasal sinus mucosal thickening.  IMPRESSION: Motion degraded exam.  No acute infarct.  Prominent small vessel disease type changes.  Please see above.      Assessment and Plan   1. Intermittent Confusion: Has had waxing and waning episodes of confusion since hospitalization. MRI without acute intracranial findings. Afebrile without leukocytosis. Unsure of etiology. ? R/t h/o ETOH abuse. 2. Atrial flutter w/ rvr: H/o chronic a.flutter, now w/ RVR in the setting of acute illness. Continues to have elevated rates despite increased carvedilol dose and digoxin.  Will switch to metoprolol BID for better rate control and in light of lung dz. Cont coumadin. 3. LV thrombus: Repeat echo with contrast completed but not yet read. On coumadin 4. Ischemic Cardiomyopathy: EF 20-25%. Euvolemic on exam. Cont BB. Initiation of ACEI/ARB limited by soft BPs. 5. Coronary Artery Disease: Stable. Medical management. Resume home statin. 6. COPD w/ acute bronchitis: Management per primary team 7. Tobacco Abuse: Counseled on smoking cessation.   Theron Arista [facility] 12/28/2011 12:28 PM

## 2011-12-28 NOTE — Progress Notes (Signed)
ANTICOAGULATION CONSULT NOTE - Follow Up Consult  Pharmacy Consult for Heparin>>Coumadin, Levaquin Indication: atrial fibrillation and suspected apical thrombus, AECOPD  Allergies  Allergen Reactions  . Oat Flour (Oat)     Patient Measurements: Height: 6' (182.9 cm) Weight: 143 lb 14.4 oz (65.273 kg) IBW/kg (Calculated) : 77.6  Heparin Dosing Weight: 64.4 Kg  Vital Signs: Temp: 97.6 F (36.4 C) (12/07 0500) BP: 121/77 mmHg (12/07 0500) Pulse Rate: 74  (12/07 0500)  Labs:  Basename 12/28/11 1035 12/28/11 0528 12/27/11 0550 12/26/11 0900 12/25/11 2324  HGB -- 15.4 13.9 -- --  HCT -- 44.5 41.5 41.5 --  PLT -- 152 142* 124* --  APTT -- -- -- -- --  LABPROT 24.6* -- 23.8* 24.6* --  INR 2.34* -- 2.24* 2.34* --  HEPARINUNFRC -- -- 0.70 0.46 0.87*  CREATININE -- -- -- -- --  CKTOTAL -- -- -- -- --  CKMB -- -- -- -- --  TROPONINI -- -- -- -- --   Lab Results  Component Value Date   WBC 6.8 12/28/2011    Estimated Creatinine Clearance: 78.2 ml/min (by C-G formula based on Cr of 0.62).  Assessment: Mr. Imhoff continues on Coumadin for aflutter and possible apical thrombus - INR therapeutic (x 3 days), H/H stable, PTLC now 152 and no bleeding noted.  His IV heparin has been discontinued.  Noted Coumadin dose not charted 12/5 but no drop off in INR so it may have been given but not charted.   HOME dose:  Coumadin dose 5 mg daily except 7.5 on Sun.  Drug/Drug Interactions:  Levaquin and Warfarin Patient is also on Levaquin for AECOPD. Noted he remains afebrile, WBC is nml. Levaquin may potentiate INR.  Zosyn 12/3>>12/5 Vanc 12/3>>12/5 Levaquin 12/3>>  12/2 UC>> neg 12/2, 12/3 BC X 4>>ngtd 12/3: MRSA pcr: neg  Goal of Therapy:  INR 2-3 Heparin level 0.3-0.7 units/ml Monitor platelets by anticoagulation protocol: Yes   Plan:   Coumadin 7.5 mg x 1 today   Will f/up daily PT/INR - if still within goal, will start maintenance dosing.  Continue Levaquin 750mg  daily,  consider d/c on 12/9 (completes 7-day course).  Nadara Mustard, PharmD., MS Clinical Pharmacist Pager:  [phone] Thank you for allowing pharmacy to be part of this patients care team. 12/28/2011 12:28 PM

## 2011-12-28 NOTE — Progress Notes (Signed)
[facility] PROGRESS NOTE  David [name] ONG:[id number] DOB: 1940-03-12 DOA: 12/23/2011 PCP: Blanchard Mane E  Confusion and dysarthia  Uncertain etiology. Patient was found to be confused and dysarthic on 12/4 and again on 12/6. ? 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  , MRI negative for stroke B12 and folate within normal limits however are clear positive with titer 1:2. Discussed with  ID , given such low titre he could have been treated in past and recommended calling state public health to verify record of treatment in past. We'll check for HIV -Patient does not have any nystagmus on exam however given history of alcohol abuse I will place him on IV thiamine 100 mg 3 times a day for 2 days followed by 500 mg daily for next 3 days.   Atrial flutter  Rate related to control from 70 to 130s however increases with activity  started on digoxin. Repeat 2-D echo with contrast shows no thrombus. continue metoprolol On chronic warfarin.  Possible LV thrombus noted on echocardiogram  Repeat Echo with contrast shows no thrumbus  Acute Bronchitis in setting of COPD  Negative x-ray findings for pneumonia. Fever resolved.  Continue empiric antibiotics (D/C vanc/zosyn and continue levaquin---day 5 ) supportive care with nebulizer and oxygen if needed.  Patient with improved exam but still has bilateral Rales.  Steroids were initially ordered but then discontinued due to worsening confusion.  Formal swallowing evaluation by speech therapy was negative   Thrombocytopenia  Improving   Ischemic cardiomyopathy/Chronic systolic congestive heart failure, NYHA class 3 (EF 25%)  Echocardiogram done this admission. The estimated ejection fraction was in the range of 20% to 25%. Diffuse hypokinesis. Atrial flutter limits allow evaluation of LV diastolic function.   On metoprolol. Not able to start ACEi or ARB due to hypotension.   CAD (coronary artery disease)  Stable. Cont home  meds  HTN (hypertension)  Low BP  ?subclincial hypothyroidism  TSH low . Free T4 is in low normal range.       Consultants:  lebeuar cardiology  Procedures:    Antibiotics:  Levofloxacin ( >>12/3) will treat for total 7 days  HPI/Subjective: Patient currently stable however waxes and wanes with periods of  confusion  Objective: Filed Vitals:   12/27/11 2311 12/28/11 0500 12/28/11 0930 12/28/11 1400  BP:  121/77  101/68  Pulse:  74  77  Temp:  97.6 F (36.4 C)  97.4 F (36.3 C)  TempSrc:    Oral  Resp:  20  18  Height:      Weight:      SpO2: 95% 94% 94% 97%    Intake/Output Summary (Last 24 hours) at 12/28/11 1437 Last data filed at 12/28/11 1300  Gross per 24 hour  Intake      0 ml  Output    550 ml  Net   -550 ml   Filed Weights   12/24/11 0300 12/25/11 2115 12/26/11 0553  Weight: 64.4 kg (141 lb 15.6 oz) 65.59 kg (144 lb 9.6 oz) 65.273 kg (143 lb 14.4 oz)    Exam:   General:  Elderly male lying in bed in no acute distress  HEENT: No pallor, moist oral mucosa poor dentition, no nystagmus  Cardiovascular: Irregularly irregular no murmurs rub or gallop  Respiratory: Clear breath sounds bilaterally, no added sounds  Abdomen: Soft, nontender, nondistended, bowel sounds present  CNS: AAO x2 with episodes of confusion  Data Reviewed: Basic Metabolic Panel:  Lab 12/25/11 4401 12/24/11 1033  12/24/11 0523 12/23/11 2149  NA 139 137 138 135  K 4.0 4.0 3.7 4.8  CL 106 103 105 100  CO2 26 26 27 21   GLUCOSE 102* 167* 110* 138*  BUN 8 9 10 14   CREATININE 0.62 0.56 0.54 0.66  CALCIUM 9.1 8.9 8.5 9.5  MG -- -- -- --  PHOS -- -- -- --   Liver Function Tests:  Lab 12/24/11 1033 12/23/11 2149  AST 28 34  ALT 18 18  ALKPHOS 56 63  BILITOT 0.6 0.7  PROT 6.5 7.6  ALBUMIN 3.1* 3.7   No results found for this basename: LIPASE:5,AMYLASE:5 in the last 168 hours  Lab 12/24/11 1033  AMMONIA 31   CBC:  Lab 12/28/11 0528 12/27/11 0550 12/26/11  0900 12/25/11 0440 12/24/11 0523 12/23/11 2240  WBC 6.8 6.3 6.6 8.8 8.2 --  NEUTROABS -- -- -- -- -- 7.0  HGB 15.4 13.9 14.0 13.2 13.9 --  HCT 44.5 41.5 41.5 39.5 41.0 --  MCV 92.7 93.3 94.5 93.8 93.8 --  PLT 152 142* 124* 131* 134* --   Cardiac Enzymes: No results found for this basename: CKTOTAL:5,CKMB:5,CKMBINDEX:5,TROPONINI:5 in the last 168 hours BNP (last 3 results) No results found for this basename: PROBNP:3 in the last 8760 hours CBG:  Lab 12/24/11 0259  GLUCAP 149*    Recent Results (from the past 240 hour(s))  URINE CULTURE     Status: Normal   Collection Time   12/23/11  8:54 PM      Component Value Range Status Comment   Specimen Description URINE, CATHETERIZED   Final    Special Requests NONE   Final    Culture  Setup Time 12/23/2011 21:16   Final    Colony Count NO GROWTH   Final    Culture NO GROWTH   Final    Report Status 12/25/2011 FINAL   Final   CULTURE, BLOOD (ROUTINE X 2)     Status: Normal (Preliminary result)   Collection Time   12/23/11  9:54 PM      Component Value Range Status Comment   Specimen Description BLOOD RIGHT HAND   Final    Special Requests BOTTLES DRAWN AEROBIC ONLY 3CC   Final    Culture  Setup Time 12/24/2011 05:50   Final    Culture     Final    Value:        BLOOD CULTURE RECEIVED NO GROWTH TO DATE CULTURE WILL BE HELD FOR 5 DAYS BEFORE ISSUING A FINAL NEGATIVE REPORT   Report Status PENDING   Incomplete   CULTURE, BLOOD (ROUTINE X 2)     Status: Normal (Preliminary result)   Collection Time   12/23/11 10:32 PM      Component Value Range Status Comment   Specimen Description BLOOD RIGHT HAND   Final    Special Requests BOTTLES DRAWN AEROBIC ONLY 10CC   Final    Culture  Setup Time 12/24/2011 05:50   Final    Culture     Final    Value:        BLOOD CULTURE RECEIVED NO GROWTH TO DATE CULTURE WILL BE HELD FOR 5 DAYS BEFORE ISSUING A FINAL NEGATIVE REPORT   Report Status PENDING   Incomplete   CULTURE, BLOOD (ROUTINE X 2)      Status: Normal (Preliminary result)   Collection Time   12/24/11  1:30 AM      Component Value Range Status Comment   Specimen Description BLOOD  RIGHT HAND   Final    Special Requests BOTTLES DRAWN AEROBIC ONLY 3CC   Final    Culture  Setup Time 12/24/2011 08:57   Final    Culture     Final    Value:        BLOOD CULTURE RECEIVED NO GROWTH TO DATE CULTURE WILL BE HELD FOR 5 DAYS BEFORE ISSUING A FINAL NEGATIVE REPORT   Report Status PENDING   Incomplete   CULTURE, BLOOD (ROUTINE X 2)     Status: Normal (Preliminary result)   Collection Time   12/24/11  1:43 AM      Component Value Range Status Comment   Specimen Description BLOOD RIGHT ARM   Final    Special Requests BOTTLES DRAWN AEROBIC ONLY 3CC   Final    Culture  Setup Time 12/24/2011 08:57   Final    Culture     Final    Value:        BLOOD CULTURE RECEIVED NO GROWTH TO DATE CULTURE WILL BE HELD FOR 5 DAYS BEFORE ISSUING A FINAL NEGATIVE REPORT   Report Status PENDING   Incomplete   MRSA PCR SCREENING     Status: Normal   Collection Time   12/24/11  3:00 AM      Component Value Range Status Comment   MRSA by PCR NEGATIVE  NEGATIVE Final   MRSA PCR SCREENING     Status: Normal   Collection Time   12/25/11 12:27 PM      Component Value Range Status Comment   MRSA by PCR NEGATIVE  NEGATIVE Final      Studies: No results found.  Scheduled Meds:   . albuterol  2.5 mg Nebulization BID   And  . ipratropium  0.5 mg Nebulization BID  . atorvastatin  40 mg Oral q1800  . digoxin  0.125 mg Oral Daily  . DULoxetine  60 mg Oral Daily  . folic acid  1 mg Oral Daily  . guaiFENesin  600 mg Oral BID  . lactulose  30 g Oral Daily  . levofloxacin  750 mg Oral Daily  . metoprolol tartrate  50 mg Oral BID  . mirtazapine  15 mg Oral QHS  . paliperidone  6 mg Oral Daily  . QUEtiapine  50 mg Oral BID  . sodium chloride  3 mL Intravenous Q12H  . small volume/piggyback builder   Intravenous Q8H  . thiamine  100 mg Oral Daily  . topiramate   25 mg Oral QHS  . [COMPLETED] warfarin  10 mg Oral ONCE-1800  . warfarin  7.5 mg Oral ONCE-1800  . Warfarin - Pharmacist Dosing Inpatient   Does not apply q1800  . [DISCONTINUED] albuterol  2.5 mg Nebulization Q6H  . [DISCONTINUED] albuterol  2.5 mg Nebulization Q6H  . [DISCONTINUED] carvedilol  6.25 mg Oral BID WC  . [DISCONTINUED] ipratropium  0.5 mg Nebulization Q6H  . [DISCONTINUED] ipratropium  0.5 mg Nebulization BID  . [DISCONTINUED] thiamine  500 mg Intravenous Q8H  . [DISCONTINUED] thiamine  100 mg Oral Daily   Continuous Infusions:   . sodium chloride 10 mL/hr (12/25/11 1506)      Time spent: 25 MINUTES    David [name]  [facility] Pager [phone]. If 8PM-8AM, please contact night-coverage at [email or web address], password [facility] 12/28/2011, 2:37 PM  LOS: 5 days

## 2011-12-29 LAB — CBC
Hemoglobin: 14.9 g/dL (ref 13.0–17.0)
MCH: 31.3 pg (ref 26.0–34.0)
MCHC: 34.2 g/dL (ref 30.0–36.0)
MCV: 91.6 fL (ref 78.0–100.0)
RBC: 4.76 MIL/uL (ref 4.22–5.81)

## 2011-12-29 NOTE — Progress Notes (Signed)
ANTICOAGULATION CONSULT NOTE - Follow Up Consult  Pharmacy Consult for Heparin>>Coumadin, Levaquin Indication: atrial fibrillation and suspected apical thrombus, AECOPD  Allergies  Allergen Reactions  . Oat Flour (Oat)    Patient Measurements: Height: 6' (182.9 cm) Weight: 143 lb 14.4 oz (65.273 kg) IBW/kg (Calculated) : 77.6  Heparin Dosing Weight: 64.4 Kg  Vital Signs: Temp: 98.4 F (36.9 C) (12/08 0500) BP: 101/66 mmHg (12/08 0500) Pulse Rate: 73  (12/08 0500)  Labs:  Basename 12/29/11 0640 12/28/11 1035 12/28/11 0528 12/27/11 0550 12/26/11 0900  HGB 14.9 -- 15.4 -- --  HCT 43.6 -- 44.5 41.5 --  PLT 147* -- 152 142* --  APTT -- -- -- -- --  LABPROT 32.0* 24.6* -- 23.8* --  INR 3.34* 2.34* -- 2.24* --  HEPARINUNFRC -- -- -- 0.70 0.46  CREATININE -- -- -- -- --  CKTOTAL -- -- -- -- --  CKMB -- -- -- -- --  TROPONINI -- -- -- -- --   Lab Results  Component Value Date   WBC 8.9 12/29/2011    Estimated Creatinine Clearance: 78.2 ml/min (by C-G formula based on Cr of 0.62).  Assessment: Mr. Bidinger continues on Coumadin for aflutter and possible apical thrombus - INR therapeutic but has trended upward and is now above desired goal range.  H/H stable, PTLC now 147 and no bleeding noted.  His IV heparin has been discontinued.  Noted Coumadin dose not charted 12/5 but no drop off in INR so it may have been given but not charted.    HOME dose:  Coumadin dose 5 mg daily except 7.5 on Sun.   Drug/Drug Interactions:  Levaquin and Warfarin Patient is also on Levaquin for AECOPD. Noted he remains afebrile, WBC is nml. Levaquin may potentiate INR.  Zosyn 12/3>>12/5 Vanc 12/3>>12/5 Levaquin 12/3>>  12/2 UC>> neg 12/2, 12/3 BC X 4>>ngtd 12/3: MRSA pcr: neg  Goal of Therapy:  INR 2-3 Heparin level 0.3-0.7 units/ml Monitor platelets by anticoagulation protocol: Yes   Plan:   Coumadin 5 mg x 1 today   Will f/up daily PT/INR - if still within goal, will start  maintenance dosing.  Continue Levaquin 750mg  daily, consider d/c on 12/9 (completes 7-day course).  Nadara Mustard, PharmD., MS Clinical Pharmacist Pager:  [phone] Thank you for allowing pharmacy to be part of this patients care team. 12/29/2011 8:29 AM

## 2011-12-29 NOTE — Progress Notes (Signed)
[facility] PROGRESS NOTE  David [name] WUJ:[id number] DOB: 11/15/1940 DOA: 12/23/2011 PCP: Mikki Harbor   Assessment/ Plan  Confusion and dysarthia  Uncertain etiology. Patient was found to be confused and dysarthic on 12/4 and again on 12/6. ? 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  ,MRI negative for stroke  B12 and folate within normal limits however are clear positive with titer 1:2. Discussed with ID , given such low titre he could have been treated in past and recommended calling state public health to verify record of treatment in past.  HIV antibody negative. -Patient does not have any nystagmus on exam however given history of alcohol abuse . Placed  him on IV thiamine 100 mg 3 times a day for 2 days followed by 500 mg daily for next 3 days. ( started on 12/7)  Atrial flutter  Rate better controlled today Was started on digoxin. Repeat 2-D echo with contrast shows no thrombus. continue metoprolol ( coreg switched to metoprolol) dced digoxin today. On chronic warfarin.  Appreciate cardiology recommendations  Possible LV thrombus noted on echocardiogram  Repeat Echo with contrast shows no thrumbus   Acute Bronchitis in setting of COPD  -Negative x-ray findings for pneumonia. Fever resolved.  -on empiric antibiotics (D/C vanc/zosyn , completed 5 day of levaquin ) supportive care with nebulizer and oxygen if needed.  Patient with improved exam but still has bilateral Rales.  Steroids were initially ordered but then discontinued due to worsening confusion.  Formal swallowing evaluation by speech therapy was negative   Thrombocytopenia  Improving   Ischemic cardiomyopathy/Chronic systolic congestive heart failure, NYHA class 3 (EF 25%)  Echocardiogram done this admission. The estimated ejection fraction was in the range of 20% to 25%. Diffuse hypokinesis. Atrial flutter limits allow evaluation of LV diastolic function.  On metoprolol. Not able to  start ACEi or ARB due to hypotension.   CAD (coronary artery disease)  Stable. Cont home meds   HTN (hypertension)  Low BP   ?subclincial hypothyroidism  TSH low . Free T4 is in low normal range.    Consultants:  lebeuar cardiology Procedures:  Antibiotics:  Levofloxacin ( >>12/3) will treat for total 7 days     HPI/Subjective: No overnight issues. Still confused off and on.  Objective: Filed Vitals:   12/28/11 2236 12/29/11 0500 12/29/11 0918 12/29/11 1331  BP: 103/69 101/66  112/67  Pulse: 74 73  69  Temp:  98.4 F (36.9 C)  97.8 F (36.6 C)  TempSrc:    Oral  Resp:  16  18  Height:      Weight:      SpO2:  97% 93% 97%    Intake/Output Summary (Last 24 hours) at 12/29/11 1544 Last data filed at 12/29/11 0600  Gross per 24 hour  Intake    100 ml  Output      0 ml  Net    100 ml   Filed Weights   12/24/11 0300 12/25/11 2115 12/26/11 0553  Weight: 64.4 kg (141 lb 15.6 oz) 65.59 kg (144 lb 9.6 oz) 65.273 kg (143 lb 14.4 oz)    Exam:  General: Elderly male lying in bed in no acute distress  HEENT: No pallor, moist oral mucosa poor dentition, no nystagmus  Cardiovascular: Irregularly irregular no murmurs rub or gallop  Respiratory: Clear breath sounds bilaterally, no added sounds  Abdomen: Soft, nontender, nondistended, bowel sounds present  CNS: AAO x2 with episodes of confusion  Data Reviewed: Basic Metabolic Panel:  Lab 12/25/11 0440 12/24/11 1033 12/24/11 0523 12/23/11 2149  NA 139 137 138 135  K 4.0 4.0 3.7 4.8  CL 106 103 105 100  CO2 26 26 27 21   GLUCOSE 102* 167* 110* 138*  BUN 8 9 10 14   CREATININE 0.62 0.56 0.54 0.66  CALCIUM 9.1 8.9 8.5 9.5  MG -- -- -- --  PHOS -- -- -- --   Liver Function Tests:  Lab 12/24/11 1033 12/23/11 2149  AST 28 34  ALT 18 18  ALKPHOS 56 63  BILITOT 0.6 0.7  PROT 6.5 7.6  ALBUMIN 3.1* 3.7   No results found for this basename: LIPASE:5,AMYLASE:5 in the last 168 hours  Lab 12/24/11 1033  AMMONIA 31    CBC:  Lab 12/29/11 0640 12/28/11 0528 12/27/11 0550 12/26/11 0900 12/25/11 0440 12/23/11 2240  WBC 8.9 6.8 6.3 6.6 8.8 --  NEUTROABS -- -- -- -- -- 7.0  HGB 14.9 15.4 13.9 14.0 13.2 --  HCT 43.6 44.5 41.5 41.5 39.5 --  MCV 91.6 92.7 93.3 94.5 93.8 --  PLT 147* 152 142* 124* 131* --   Cardiac Enzymes: No results found for this basename: CKTOTAL:5,CKMB:5,CKMBINDEX:5,TROPONINI:5 in the last 168 hours BNP (last 3 results) No results found for this basename: PROBNP:3 in the last 8760 hours CBG:  Lab 12/24/11 0259  GLUCAP 149*    Recent Results (from the past 240 hour(s))  URINE CULTURE     Status: Normal   Collection Time   12/23/11  8:54 PM      Component Value Range Status Comment   Specimen Description URINE, CATHETERIZED   Final    Special Requests NONE   Final    Culture  Setup Time 12/23/2011 21:16   Final    Colony Count NO GROWTH   Final    Culture NO GROWTH   Final    Report Status 12/25/2011 FINAL   Final   CULTURE, BLOOD (ROUTINE X 2)     Status: Normal (Preliminary result)   Collection Time   12/23/11  9:54 PM      Component Value Range Status Comment   Specimen Description BLOOD RIGHT HAND   Final    Special Requests BOTTLES DRAWN AEROBIC ONLY 3CC   Final    Culture  Setup Time 12/24/2011 05:50   Final    Culture     Final    Value:        BLOOD CULTURE RECEIVED NO GROWTH TO DATE CULTURE WILL BE HELD FOR 5 DAYS BEFORE ISSUING A FINAL NEGATIVE REPORT   Report Status PENDING   Incomplete   CULTURE, BLOOD (ROUTINE X 2)     Status: Normal (Preliminary result)   Collection Time   12/23/11 10:32 PM      Component Value Range Status Comment   Specimen Description BLOOD RIGHT HAND   Final    Special Requests BOTTLES DRAWN AEROBIC ONLY 10CC   Final    Culture  Setup Time 12/24/2011 05:50   Final    Culture     Final    Value:        BLOOD CULTURE RECEIVED NO GROWTH TO DATE CULTURE WILL BE HELD FOR 5 DAYS BEFORE ISSUING A FINAL NEGATIVE REPORT   Report Status PENDING    Incomplete   CULTURE, BLOOD (ROUTINE X 2)     Status: Normal (Preliminary result)   Collection Time   12/24/11  1:30 AM      Component Value Range Status Comment  Specimen Description BLOOD RIGHT HAND   Final    Special Requests BOTTLES DRAWN AEROBIC ONLY 3CC   Final    Culture  Setup Time 12/24/2011 08:57   Final    Culture     Final    Value:        BLOOD CULTURE RECEIVED NO GROWTH TO DATE CULTURE WILL BE HELD FOR 5 DAYS BEFORE ISSUING A FINAL NEGATIVE REPORT   Report Status PENDING   Incomplete   CULTURE, BLOOD (ROUTINE X 2)     Status: Normal (Preliminary result)   Collection Time   12/24/11  1:43 AM      Component Value Range Status Comment   Specimen Description BLOOD RIGHT ARM   Final    Special Requests BOTTLES DRAWN AEROBIC ONLY 3CC   Final    Culture  Setup Time 12/24/2011 08:57   Final    Culture     Final    Value:        BLOOD CULTURE RECEIVED NO GROWTH TO DATE CULTURE WILL BE HELD FOR 5 DAYS BEFORE ISSUING A FINAL NEGATIVE REPORT   Report Status PENDING   Incomplete   MRSA PCR SCREENING     Status: Normal   Collection Time   12/24/11  3:00 AM      Component Value Range Status Comment   MRSA by PCR NEGATIVE  NEGATIVE Final   MRSA PCR SCREENING     Status: Normal   Collection Time   12/25/11 12:27 PM      Component Value Range Status Comment   MRSA by PCR NEGATIVE  NEGATIVE Final      Studies: No results found.  Scheduled Meds:   . albuterol  2.5 mg Nebulization BID   And  . ipratropium  0.5 mg Nebulization BID  . atorvastatin  40 mg Oral q1800  . DULoxetine  60 mg Oral Daily  . folic acid  1 mg Oral Daily  . guaiFENesin  600 mg Oral BID  . lactulose  30 g Oral Daily  . levofloxacin  750 mg Oral Daily  . metoprolol tartrate  50 mg Oral BID  . mirtazapine  15 mg Oral QHS  . paliperidone  6 mg Oral Daily  . QUEtiapine  50 mg Oral BID  . sodium chloride  3 mL Intravenous Q12H  . small volume/piggyback builder   Intravenous Q8H  . thiamine  100 mg Oral Daily   . topiramate  25 mg Oral QHS  . [COMPLETED] warfarin  7.5 mg Oral ONCE-1800  . Warfarin - Pharmacist Dosing Inpatient   Does not apply q1800  . [DISCONTINUED] digoxin  0.125 mg Oral Daily   Continuous Infusions:   . sodium chloride 10 mL/hr (12/25/11 1506)      Time spent:25 minutes    Daeron Carreno  [facility] Pager [phone]. If 8PM-8AM, please contact night-coverage at [email or web address], password [email or web address] 12/29/2011, 3:44 PM  LOS: 6 days

## 2011-12-29 NOTE — Progress Notes (Signed)
Cardiology Progress Note Patient Name: David [name] Date of Encounter: 12/29/2011, 10:44 AM     Subjective   Denies chest pain or sob.    Objective   Telemetry: atrial flutter 70s. Rate well controlled.  Medications: . albuterol  2.5 mg Nebulization Q6H  . ipratropium  0.5 mg Nebulization Q6H  . carvedilol  3.125 mg Oral Once  . metoprolol  50 mg Oral BID WC  . [COMPLETED] digoxin  0.25 mg Intravenous Q8H  . digoxin  0.125 mg Oral Daily  . DULoxetine  60 mg Oral Daily  . guaiFENesin  600 mg Oral BID  . [EXPIRED] influenza  inactive virus vaccine  0.5 mL Intramuscular Tomorrow-1000  . levofloxacin (LEVAQUIN) IV  750 mg Intravenous Q24H  . mirtazapine  15 mg Oral QHS  . paliperidone  6 mg Oral Daily  . [EXPIRED] pneumococcal 23 valent vaccine  0.5 mL Intramuscular Tomorrow-1000  . QUEtiapine  50 mg Oral BID  . sodium chloride  3 mL Intravenous Q12H  . topiramate  25 mg Oral QHS  . Warfarin - Pharmacist Dosing Inpatient   Does not apply q1800   . sodium chloride 10 mL/hr (12/25/11 1506)  . heparin 850 Units/hr (12/27/11 0218)    Physical Exam: Temp:  [97.4 F (36.3 C)-98.4 F (36.9 C)] 98.4 F (36.9 C) (12/08 0500) Pulse Rate:  [73-77] 73  (12/08 0500) Resp:  [16-18] 16  (12/08 0500) BP: (99-103)/(64-69) 101/66 mmHg (12/08 0500) SpO2:  [93 %-97 %] 93 % (12/08 0918)  General: Elderly black male, in no acute distress. Head: Normocephalic, atraumatic, sclera non-icteric, nares are without discharge.  Neck: Supple. Negative for carotid bruits or JVD Lungs: Coarse with scattered wheezes. Breathing is unlabored. Heart: Irregularly irregular S1 S2 without murmurs, rubs, or gallops.  Abdomen: Soft, non-tender, non-distended with normoactive bowel sounds. No rebound/guarding. No obvious abdominal masses. Msk:  Strength and tone appear normal for age. Extremities: No edema. No clubbing or cyanosis. Distal pedal pulses are intact and equal bilaterally. Neuro: Alert and  oriented to person and place. Moves all extremities spontaneously. Psych:  Responds to questions appropriately with a normal affect.   Intake/Output Summary (Last 24 hours) at 12/29/11 1044 Last data filed at 12/29/11 0600  Gross per 24 hour  Intake    100 ml  Output    450 ml  Net   -350 ml    Labs: No results found for this basename: NA:2,K:2,CL:2,CO2:2,GLUCOSE:2,BUN:2,CREATININE:2,CALCIUM:2,MG:2,PHOS:2 in the last 72 hours No results found for this basename: AST:2,ALT:2,ALKPHOS:2,BILITOT:2,PROT:2,ALBUMIN:2 in the last 72 hours  Basename 12/29/11 0640 12/28/11 0528  WBC 8.9 6.8  NEUTROABS -- --  HGB 14.9 15.4  HCT 43.6 44.5  MCV 91.6 92.7  PLT 147* 152  No results found for this basename: TSH,T4TOTAL,FREET3,T3FREE,THYROIDAB in the last 72 hours   12/27/2011 05:50  Prothrombin Time 23.8 (H)  INR 2.24 (H)   Radiology/Studies:   12/24/11 - Echo Study Conclusions: - Left ventricle: The cavity size was normal. Systolic function was severely reduced. The estimated ejection fraction was in the range of 20% to 25%. Diffuse hypokinesis. Atrial flutter limits allow evaluation of LV diastolic function. Cannot exclude apical thrombus. - Mitral valve: Mild regurgitation. - Left atrium: The atrium was mildly dilated. - Right ventricle: Systolic function was reduced. - Right atrium: The atrium was mildly dilated.  12/23/2011 - CHEST - 2 VIEW  Findings: Cardiomediastinal silhouette is stable.  No acute infiltrate or pleural effusion.  No pulmonary edema.  Bony thorax is stable.  IMPRESSION: No active disease.   12/24/2011  -  CT CHEST WITH CONTRAST  Findings: Mild emphysema is noted at the upper lung lobes.  The lungs are otherwise clear.  There is no evidence of focal consolidation, pleural effusion or pneumothorax. No masses are identified.  Diffuse coronary artery calcifications are seen.  A 1.1 cm right paratracheal node is seen; aortopulmonary window nodes are borderline normal in size.   The mediastinum is otherwise unremarkable in appearance.  No pericardial effusion is identified. The great vessels are grossly unremarkable in appearance.  Air within the pulmonary outflow tract is transient in nature. Scattered calcification is noted along the thoracic and upper abdominal aorta.  No axillary lymphadenopathy is seen.  The visualized portions of the thyroid gland are unremarkable in appearance, aside from mild vague hypodensities, without a dominant mass.  The visualized portions of the liver and spleen are unremarkable.  No acute osseous abnormalities are identified.  IMPRESSION:  1.  Mild emphysema noted at the upper lung lobes.  Lungs otherwise clear. 2.  Diffuse coronary artery calcifications seen. 3.  1.1 cm right paratracheal node, nonspecific in appearance. 4.  Scattered calcification along the thoracic and upper abdominal aorta.     12/26/2011  - MRI HEAD WITHOUT CONTRAST  Findings: Motion degraded exam.  No acute infarct.  No intracranial hemorrhage.  Prominent small vessel disease type changes.  No intracranial mass lesion detected on this unenhanced exam.  Global atrophy without hydrocephalus.  Major intracranial vascular structures are patent.  Left vertebral artery appears congenitally small and there may be superimposed atherosclerotic type changes with narrowing distally.  Cervical medullary junction, pituitary region, pineal region and orbital structures unremarkable.  Minimal paranasal sinus mucosal thickening.  IMPRESSION: Motion degraded exam.  No acute infarct.  Prominent small vessel disease type changes.  Please see above.      Assessment and Plan   1. Intermittent Confusion: Has had waxing and waning episodes of confusion since hospitalization. MRI without acute intracranial findings. 2. Atrial flutter w/ rvr: H/o chronic a.flutter, now w/ RVR in the setting of acute illness. HR much improved with switch from carvedilol to metoprolol. Will DC digoxin. 3. LV thrombus:  Repeat echo with contrast showed no thrombus. On coumadin 4. Ischemic Cardiomyopathy: EF 20-25%. Euvolemic on exam. Cont BB. Initiation of ACEI/ARB limited by soft BPs. 5. Coronary Artery Disease: Stable. Medical management. Resume home statin. 6. COPD w/ acute bronchitis: Management per primary team 7. Tobacco Abuse: Counseled on smoking cessation.   Theron Arista [facility] 12/29/2011 10:44 AM

## 2011-12-30 LAB — T.PALLIDUM AB, IGG: T pallidum Antibodies (TP-PA): >8 S/CO — ABNORMAL HIGH (ref <0.90)

## 2011-12-30 LAB — CULTURE, BLOOD (ROUTINE X 2)
Culture: NO GROWTH
Culture: NO GROWTH

## 2011-12-30 LAB — FOLATE RBC: RBC Folate: 667 ng/mL — ABNORMAL HIGH (ref >=366)

## 2011-12-30 LAB — CBC
HCT: 43.4 % (ref 39.0–52.0)
MCV: 91.8 fL (ref 78.0–100.0)
RBC: 4.73 MIL/uL (ref 4.22–5.81)
WBC: 10.1 10*3/uL (ref 4.0–10.5)

## 2011-12-30 MED ORDER — WARFARIN SODIUM 2.5 MG PO TABS
2.5000 mg | ORAL_TABLET | Freq: Once | ORAL | Status: AC
  Administered 2011-12-30: 2.5 mg via ORAL
  Filled 2011-12-30: qty 1

## 2011-12-30 MED ORDER — PENICILLIN G BENZATHINE & PROC 900000-300000 UNIT/2ML IM SUSP: 2.4000 10*6.[IU] | INTRAMUSCULAR | Status: DC

## 2011-12-30 MED ORDER — METOPROLOL TARTRATE 25 MG PO TABS
25.0000 mg | ORAL_TABLET | Freq: Two times a day (BID) | ORAL | Status: DC
  Administered 2011-12-30 – 2011-12-31 (×3): 25 mg via ORAL
  Filled 2011-12-30 (×4): qty 1

## 2011-12-30 MED ORDER — PENICILLIN G BENZATHINE 1200000 UNIT/2ML IM SUSP
2.4000 10*6.[IU] | INTRAMUSCULAR | Status: DC
  Administered 2011-12-30: 2.4 10*6.[IU] via INTRAMUSCULAR
  Filled 2011-12-30: qty 4

## 2011-12-30 NOTE — Progress Notes (Signed)
ANTICOAGULATION CONSULT NOTE - Follow Up Consult  Pharmacy Consult for Coumadin, Levaquin Indication: atrial fibrillation and suspected apical thrombus, AECOPD  Allergies  Allergen Reactions  . Oat Flour (Oat)    Patient Measurements: Height: 6' (182.9 cm) Weight: 143 lb 14.4 oz (65.273 kg) IBW/kg (Calculated) : 77.6  Heparin Dosing Weight: 64.4 Kg  Vital Signs: Temp: 98.4 F (36.9 C) (12/09 0500) BP: 102/62 mmHg (12/09 0500) Pulse Rate: 64  (12/09 0500)  Labs:  Basename 12/30/11 0710 12/29/11 0640 12/28/11 1035 12/28/11 0528  HGB 15.3 14.9 -- --  HCT 43.4 43.6 -- 44.5  PLT 186 147* -- 152  APTT -- -- -- --  LABPROT 31.9* 32.0* 24.6* --  INR 3.33* 3.34* 2.34* --  HEPARINUNFRC -- -- -- --  CREATININE -- -- -- --  CKTOTAL -- -- -- --  CKMB -- -- -- --  TROPONINI -- -- -- --   Lab Results  Component Value Date   WBC PENDING 12/30/2011    Estimated Creatinine Clearance: 78.2 ml/min (by C-G formula based on Cr of 0.62).  Assessment: David [name] continues on Coumadin for aflutter and possible apical thrombus.  INR remains slightly supratherapeutic despite no coumadin last PM.  H/H stable, PTLC imropved, no bleeding noted.   HOME dose:  Coumadin dose 5 mg daily except 7.5 on Sun.   Drug/Drug Interactions:  Levaquin and Warfarin Patient is also on Levaquin for AECOPD. Noted he remains afebrile, WBC is nml. Levaquin may potentiate INR.  Zosyn 12/3>>12/5 Vanc 12/3>>12/5 Levaquin 12/3>>  12/2 UC>> neg 12/2, 12/3 BC X 4>>ngtd 12/3: MRSA pcr: neg  Goal of Therapy:  INR 2-3 Heparin level 0.3-0.7 units/ml Monitor platelets by anticoagulation protocol: Yes   Plan:  - Coumadin 2.5 mg po x1 tonight - Will f/up daily PT/INR - if still within goal, will start maintenance dosing - Continue Levaquin 750mg  daily, consider d/c after today's dose, 12/9 (completes 7-day course).  David [name] L. Illene Bolus, PharmD, BCPS Clinical Pharmacist Pager: [phone] Pharmacy:  [phone] 12/30/2011 8:49 AM

## 2011-12-30 NOTE — Progress Notes (Signed)
Physical Therapy Treatment Patient Details Name: David [name] MRN: [id number] DOB: [birth year] Today's Date: 12/30/2011 Time: 6962-9528 PT Time Calculation (min): 24 min  PT Assessment / Plan / Recommendation Comments on Treatment Session  [name] continues to be confused today. Will need 24 hour supervision for all mobility at home given his confusion and instability.     Follow Up Recommendations  Home health PT;Supervision/Assistance - 24 hour; (SNF if ALF cannot provide 24 hour supervision)     Does the patient have the potential to tolerate intense rehabilitation     Barriers to Discharge        Equipment Recommendations  None recommended by PT    Recommendations for Other Services    Frequency Min 3X/week   Plan Discharge plan remains appropriate;Frequency remains appropriate    Precautions / Restrictions Precautions Precautions: Fall Restrictions Weight Bearing Restrictions: No       Mobility  Bed Mobility Bed Mobility: Supine to Sit Supine to Sit: 5: Supervision Transfers Transfers: Sit to Stand;Stand to Sit Sit to Stand: With upper extremity assist;4: Min guard Stand to Sit: 5: Supervision;With upper extremity assist Details for Transfer Assistance: gaurding for stability Ambulation/Gait Ambulation/Gait Assistance: 4: Min guard Ambulation/Gait Assistance Details: cues for tall posture and gaurding for stability, no physical assist needed but did not challenge his balance tremendously given he already appears unsteady and weak today, staggers more when challenged (head turns and obstacles with impaired reaction time)  Gait Pattern: Narrow base of support;Decreased step length - left;Decreased step length - right Gait velocity: slowed General Gait Details: decreased step height bilaterally, increased lateral sway especially as he fatigues       PT Goals Acute Rehab PT Goals PT Goal: Supine/Side to Sit - Progress: Progressing toward goal PT Goal: Sit to  Stand - Progress: Progressing toward goal PT Goal: Stand to Sit - Progress: Progressing toward goal PT Transfer Goal: Bed to Chair/Chair to Bed - Progress: Progressing toward goal PT Goal: Ambulate - Progress: Progressing toward goal  Visit Information  Last PT Received On: 12/30/11 Assistance Needed: +1    Subjective Data  Subjective: Its December. (when asked what year it was)   Cognition  Overall Cognitive Status: Impaired Area of Impairment: Attention;Memory;Following commands;Awareness of deficits Arousal/Alertness: Awake/alert Orientation Level: Disoriented to;Place;Time;Situation Behavior During Session: Flat affect Current Attention Level: Sustained Following Commands: Follows one step commands with increased time;Follows one step commands consistently;Follows multi-step commands with increased time Safety/Judgement: Decreased awareness of need for assistance Cognition - Other Comments: slow    Balance  Static Standing Balance Static Standing - Balance Support: No upper extremity supported Static Standing - Level of Assistance: 5: Stand by assistance Static Standing - Comment/# of Minutes: pt stood at the sink to wash hands and stood at toilet to urinate needing gaurding for potential LOB given weakness, pt able to stand for at least 5 minutes and reach for soap, turn off and on the faucet  End of Session PT - End of Session Equipment Utilized During Treatment: Gait belt Activity Tolerance: Patient tolerated treatment well Patient left: in bed;with call bell/phone within reach;with bed alarm set Nurse Communication: Mobility status   GP     [facility] HELEN 12/30/2011, 9:19 AM

## 2011-12-30 NOTE — Progress Notes (Signed)
Patient seen and examined . Agree with  Assessment and plan outlined . Plan to treat with 3 doses  of IM penicillin for positive RPR with no  record of treatment and follow up as outpatient. Plan to d/c to ALF in am

## 2011-12-30 NOTE — Progress Notes (Signed)
TELEMETRY: Reviewed telemetry pt in atrial flutter with rate 60-70, bradycardia down into the 40s this am.: Filed Vitals:   12/29/11 1331 12/29/11 2100 12/29/11 2115 12/30/11 0500  BP: 112/67 113/73  102/62  Pulse: 69 73  64  Temp: 97.8 F (36.6 C) 98.9 F (37.2 C)  98.4 F (36.9 C)  TempSrc: Oral     Resp: 18 16  16   Height:      Weight:      SpO2: 97% 96% 91% 95%    Intake/Output Summary (Last 24 hours) at 12/30/11 0750 Last data filed at 12/29/11 2220  Gross per 24 hour  Intake     50 ml  Output    100 ml  Net    -50 ml    SUBJECTIVE Patient denies palpitations. No chest pain or SOB.  LABS: Basic Metabolic Panel: No results found for this basename: NA:2,K:2,CL:2,CO2:2,GLUCOSE:2,BUN:2,CREATININE:2,CALCIUM:2,MG:2,PHOS:2 in the last 72 hours Liver Function Tests: No results found for this basename: AST:2,ALT:2,ALKPHOS:2,BILITOT:2,PROT:2,ALBUMIN:2 in the last 72 hours No results found for this basename: LIPASE:2,AMYLASE:2 in the last 72 hours CBC:  Basename 12/29/11 0640 12/28/11 0528  WBC 8.9 6.8  NEUTROABS -- --  HGB 14.9 15.4  HCT 43.6 44.5  MCV 91.6 92.7  PLT 147* 152   Thyroid Function Tests: No results found for this basename: TSH,T4TOTAL,FREET3,T3FREE,THYROIDAB in the last 72 hours  Radiology/Studies:  Dg Chest 2 View  12/23/2011  *RADIOLOGY REPORT*  Clinical Data: Back pain, groin pain, cough, chills, fever  CHEST - 2 VIEW  Comparison: 04/11/2011  Findings: Cardiomediastinal silhouette is stable.  No acute infiltrate or pleural effusion.  No pulmonary edema.  Bony thorax is stable.  IMPRESSION: No active disease.   Original Report Authenticated By: Natasha Mead, M.D.    Ct Chest W Contrast  12/24/2011  *RADIOLOGY REPORT*  Clinical Data: Pain and numbness in the groin for 2 days; cough. Back pain.  CT CHEST WITH CONTRAST  Technique:  Multidetector CT imaging of the chest was performed following the standard protocol during bolus administration of intravenous  contrast.  Contrast: 75mL OMNIPAQUE IOHEXOL 300 MG/ML  SOLN  Comparison: None.  Findings: Mild emphysema is noted at the upper lung lobes.  The lungs are otherwise clear.  There is no evidence of focal consolidation, pleural effusion or pneumothorax. No masses are identified.  Diffuse coronary artery calcifications are seen.  A 1.1 cm right paratracheal node is seen; aortopulmonary window nodes are borderline normal in size.  The mediastinum is otherwise unremarkable in appearance.  No pericardial effusion is identified. The great vessels are grossly unremarkable in appearance.  Air within the pulmonary outflow tract is transient in nature. Scattered calcification is noted along the thoracic and upper abdominal aorta.  No axillary lymphadenopathy is seen.  The visualized portions of the thyroid gland are unremarkable in appearance, aside from mild vague hypodensities, without a dominant mass.  The visualized portions of the liver and spleen are unremarkable.  No acute osseous abnormalities are identified.  IMPRESSION:  1.  Mild emphysema noted at the upper lung lobes.  Lungs otherwise clear. 2.  Diffuse coronary artery calcifications seen. 3.  1.1 cm right paratracheal node, nonspecific in appearance. 4.  Scattered calcification along the thoracic and upper abdominal aorta.   Original Report Authenticated By: Tonia Ghent, M.D.      PHYSICAL EXAM General: elderly, well nourished, in no acute distress. Head: Normocephalic, atraumatic, sclera non-icteric, no xanthomas, nares are without discharge. Poor dentition Neck: Negative for carotid bruits.  JVD not elevated. Lungs: Bilateral rhonchi. Breathing is unlabored. Heart: IRRR, nl S1 S2 without murmurs, rubs, or gallops.  Abdomen: Soft, non-tender, non-distended with normoactive bowel sounds. No hepatomegaly. No rebound/guarding. No obvious abdominal masses. Msk:  Strength and tone appears normal for age. Extremities: No clubbing, cyanosis or edema.  Distal  pedal pulses are 2+ and equal bilaterally. Neuro: Alert and oriented X 3. Moves all extremities spontaneously. Psych:  Responds to questions appropriately with a flat affect.  ASSESSMENT AND PLAN: 1. Atrial flutter chronic- Rate now controlled on metoprolol alone but having some bradycardia into 40s. Digoxin stopped yesterday. Will reduce metoprolol to 25 mg bid. 2. ? LV thrombus. Repeat Echo with contrast showed no thrombus. On coumadin for Atrial flutter. 3. Ischemic cardiomyopathy. EF 20-25%. On metoprolol. Not able to start ACEi or ARB due to hypotension. 4. CAD with known RCA occlusion. Moderate intermediate stenosis. Medical management. 5. PNA/bronchitis- per primary team 6. Tobacco abuse. Counseled on smoking cessation. 7. Encephalopathy.  Principal Problem:  *Dyspnea Active Problems:  Atrial flutter  CAD (coronary artery disease)  Ischemic cardiomyopathy  Tobacco abuse  HTN (hypertension)  Unspecified disorder of thyroid  Healthcare-associated pneumonia  Chronic systolic congestive heart failure, NYHA class 3  Dehydration  Metabolic encephalopathy  COPD, mild  Thrombocytopenia  Warfarin anticoagulation    Signed, Peter [place] MD,FACC 12/30/2011 7:50 AM

## 2011-12-30 NOTE — Progress Notes (Signed)
[facility] PROGRESS NOTE  David [name] EXB:[id number] DOB: October 14, 1940 DOA: 12/23/2011 PCP: Mikki Harbor  Assessment/Plan:   Atrial flutter chronic- [facility] managing.  Rate now controlled on metoprolol alone but having some bradycardia into 40s. Digoxin stopped 12/8. Metoprolol reduced to 25 mg bid.  On Chronic coumadin (currently theraputic)  ? LV thrombus. Repeat Echo with contrast showed no thrombus. On coumadin for Atrial flutter.    Ischemic cardiomyopathy. EF 20-25%. On metoprolol. Not able to start ACEi or ARB due to hypotension.   CAD with known RCA occlusion. Moderate intermediate stenosis. Medical management.   PNA/bronchitis- Has completed a 7 day course of antibiotics.  Lungs are sounding more loose.  Less rals.  Will continue with nebulizer treatments.  Tobacco abuse. Counseled on smoking cessation.   ?subclincial hypothyroidism  TSH low . Free T4 is in low normal range.  Follow up as outpatient.  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   Per [facility] he was reported positive in 2001 and 2009 but there is no documentation of treatment.  They recommended empiric treatment x 3.  ID Daiva Eves) agrees.  We will order PCN G procaine 2.4 million units q 7 days x 3.     Encephalopathy.  Uncertain etiology. Patient was found to be confused and dysarthic on 12/4 and again on 12/6. ? 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   MRI negative for stroke   B12 and folate within normal limits  HIV antibody negative.   Patient does not have any nystagmus on exam however given history of alcohol abuse . Placed him on IV thiamine 100 mg 3 times a day for 2 days followed by 500 mg daily for next 3 days. ( started on 12/7)  There is a very low possibility it is neuro syphilis.  Per Daiva Eves the only way to definitively rule it out is with lumbar puncture.  We decided the best course of action is to treat his reactive RPR empirically and monitor  for improvement.  [facility] recommended a repeat titre after treatment.   Code Status: full (presumed) Family Communication: Disposition Plan: Return to ALF with 24 hour supervision vs. Transfer to SNF.   Consultants:  [facility]  Phone call to Dr. Daiva Eves of ID.  Procedures:    Antibiotics:  Levaquin.  12/2 - 12/9  HPI/Subjective: No complaints.  Confused but appears to answer questions appropriately.  Objective: Filed Vitals:   12/29/11 2115 12/30/11 0500 12/30/11 0826 12/30/11 0921  BP:  102/62    Pulse:  64 76   Temp:  98.4 F (36.9 C)    TempSrc:      Resp:  16    Height:      Weight:      SpO2: 91% 95%  96%    Intake/Output Summary (Last 24 hours) at 12/30/11 0955 Last data filed at 12/29/11 2220  Gross per 24 hour  Intake     50 ml  Output    100 ml  Net    -50 ml   Filed Weights   12/24/11 0300 12/25/11 2115 12/26/11 0553  Weight: 64.4 kg (141 lb 15.6 oz) 65.59 kg (144 lb 9.6 oz) 65.273 kg (143 lb 14.4 oz)    Exam:   General:  Awake, taking nebulizer treatment  Cardiovascular: On tele:  A flutter at 71 bpm.  Auscultation - irregular rhythm, no m/r/g  Respiratory: Loose coarse BS.  (improvement over last week)  Abdomen:  Thin soft, nt, nd, +BS, no masses  Psych:  Mumbles, appears to answer questions appropriately with short answers, but the content is incorrect.  Data Reviewed: Basic Metabolic Panel:  Lab 12/25/11 1610 12/24/11 1033 12/24/11 0523 12/23/11 2149  NA 139 137 138 135  K 4.0 4.0 3.7 4.8  CL 106 103 105 100  CO2 26 26 27 21   GLUCOSE 102* 167* 110* 138*  BUN 8 9 10 14   CREATININE 0.62 0.56 0.54 0.66  CALCIUM 9.1 8.9 8.5 9.5  MG -- -- -- --  PHOS -- -- -- --   Liver Function Tests:  Lab 12/24/11 1033 12/23/11 2149  AST 28 34  ALT 18 18  ALKPHOS 56 63  BILITOT 0.6 0.7  PROT 6.5 7.6  ALBUMIN 3.1* 3.7    Lab 12/24/11 1033  AMMONIA 31   CBC:  Lab 12/30/11 0710 12/29/11 0640 12/28/11 0528 12/27/11 0550  12/26/11 0900 12/23/11 2240  WBC 10.1 8.9 6.8 6.3 6.6 --  NEUTROABS -- -- -- -- -- 7.0  HGB 15.3 14.9 15.4 13.9 14.0 --  HCT 43.4 43.6 44.5 41.5 41.5 --  MCV 91.8 91.6 92.7 93.3 94.5 --  PLT 186 147* 152 142* 124* --   CBG:  Lab 12/24/11 0259  GLUCAP 149*    Recent Results (from the past 240 hour(s))  URINE CULTURE     Status: Normal   Collection Time   12/23/11  8:54 PM      Component Value Range Status Comment   Specimen Description URINE, CATHETERIZED   Final    Special Requests NONE   Final    Culture  Setup Time 12/23/2011 21:16   Final    Colony Count NO GROWTH   Final    Culture NO GROWTH   Final    Report Status 12/25/2011 FINAL   Final   CULTURE, BLOOD (ROUTINE X 2)     Status: Normal   Collection Time   12/23/11  9:54 PM      Component Value Range Status Comment   Specimen Description BLOOD RIGHT HAND   Final    Special Requests BOTTLES DRAWN AEROBIC ONLY 3CC   Final    Culture  Setup Time 12/24/2011 05:50   Final    Culture NO GROWTH 5 DAYS   Final    Report Status 12/30/2011 FINAL   Final   CULTURE, BLOOD (ROUTINE X 2)     Status: Normal   Collection Time   12/23/11 10:32 PM      Component Value Range Status Comment   Specimen Description BLOOD RIGHT HAND   Final    Special Requests BOTTLES DRAWN AEROBIC ONLY 10CC   Final    Culture  Setup Time 12/24/2011 05:50   Final    Culture NO GROWTH 5 DAYS   Final    Report Status 12/30/2011 FINAL   Final   CULTURE, BLOOD (ROUTINE X 2)     Status: Normal   Collection Time   12/24/11  1:30 AM      Component Value Range Status Comment   Specimen Description BLOOD RIGHT HAND   Final    Special Requests BOTTLES DRAWN AEROBIC ONLY 3CC   Final    Culture  Setup Time 12/24/2011 08:57   Final    Culture NO GROWTH 5 DAYS   Final    Report Status 12/30/2011 FINAL   Final   CULTURE, BLOOD (ROUTINE X 2)     Status: Normal   Collection  Time   12/24/11  1:43 AM      Component Value Range Status Comment   Specimen Description  BLOOD RIGHT ARM   Final    Special Requests BOTTLES DRAWN AEROBIC ONLY 3CC   Final    Culture  Setup Time 12/24/2011 08:57   Final    Culture NO GROWTH 5 DAYS   Final    Report Status 12/30/2011 FINAL   Final   MRSA PCR SCREENING     Status: Normal   Collection Time   12/24/11  3:00 AM      Component Value Range Status Comment   MRSA by PCR NEGATIVE  NEGATIVE Final   MRSA PCR SCREENING     Status: Normal   Collection Time   12/25/11 12:27 PM      Component Value Range Status Comment   MRSA by PCR NEGATIVE  NEGATIVE Final      Studies: No results found.  Scheduled Meds:   . albuterol  2.5 mg Nebulization BID   And  . ipratropium  0.5 mg Nebulization BID  . atorvastatin  40 mg Oral q1800  . DULoxetine  60 mg Oral Daily  . folic acid  1 mg Oral Daily  . guaiFENesin  600 mg Oral BID  . lactulose  30 g Oral Daily  . levofloxacin  750 mg Oral Daily  . metoprolol tartrate  25 mg Oral BID  . mirtazapine  15 mg Oral QHS  . paliperidone  6 mg Oral Daily  . QUEtiapine  50 mg Oral BID  . sodium chloride  3 mL Intravenous Q12H  . small volume/piggyback builder   Intravenous Q8H  . thiamine  100 mg Oral Daily  . topiramate  25 mg Oral QHS  . warfarin  2.5 mg Oral ONCE-1800  . Warfarin - Pharmacist Dosing Inpatient   Does not apply q1800  . [DISCONTINUED] digoxin  0.125 mg Oral Daily  . [DISCONTINUED] metoprolol tartrate  50 mg Oral BID   Continuous Infusions:   . sodium chloride 10 mL/hr (12/25/11 1506)    Principal Problem:  *Dyspnea Active Problems:  Atrial flutter  CAD (coronary artery disease)  Ischemic cardiomyopathy  Tobacco abuse  HTN (hypertension)  Unspecified disorder of thyroid  Healthcare-associated pneumonia  Chronic systolic congestive heart failure, NYHA class 3  Dehydration  Metabolic encephalopathy  COPD, mild  Thrombocytopenia  Warfarin anticoagulation    Time spent: 25 min.    Conley Canal  [facility] Pager [phone]. If  8PM-8AM, please contact night-coverage at [email or web address], password [facility] 12/30/2011, 9:55 AM  LOS: 7 days

## 2011-12-30 NOTE — Progress Notes (Signed)
Clinical Social Worker staffed case with NP and updated [facility].  [facility] is able to have HH at their facility.  NP reports pt currently confused today.  CSW updated pt's emergency contact: Selena Batten.  [name] agreeable to current plan of ALF with HH.    Angelia Mould, MSW, [place] [phone]

## 2011-12-31 DIAGNOSIS — J441 Chronic obstructive pulmonary disease with (acute) exacerbation: Secondary | ICD-10-CM | POA: Diagnosis present

## 2011-12-31 DIAGNOSIS — A539 Syphilis, unspecified: Secondary | ICD-10-CM | POA: Diagnosis present

## 2011-12-31 LAB — CBC
HCT: 45.3 % (ref 39.0–52.0)
Hemoglobin: 15.8 g/dL (ref 13.0–17.0)
RBC: 4.91 MIL/uL (ref 4.22–5.81)
RDW: 13.2 % (ref 11.5–15.5)
WBC: 9.1 10*3/uL (ref 4.0–10.5)

## 2011-12-31 LAB — PROTIME-INR: INR: 2.59 — ABNORMAL HIGH (ref 0.00–1.49)

## 2011-12-31 MED ORDER — WARFARIN SODIUM 5 MG PO TABS: 5.0000 mg | ORAL_TABLET | Freq: Every day | ORAL | Status: DC

## 2011-12-31 MED ORDER — WARFARIN SODIUM 7.5 MG PO TABS: 7.5000 mg | ORAL_TABLET | Freq: Every day | ORAL | Status: DC

## 2011-12-31 MED ORDER — PENICILLIN G BENZATHINE 1200000 UNIT/2ML IM SUSP: 2.4000 10*6.[IU] | INTRAMUSCULAR | Status: DC

## 2011-12-31 MED ORDER — METOPROLOL TARTRATE 25 MG PO TABS: 25.0000 mg | ORAL_TABLET | Freq: Two times a day (BID) | ORAL | Status: DC

## 2011-12-31 MED ORDER — WARFARIN SODIUM 7.5 MG PO TABS: 7.5000 mg | ORAL_TABLET | ORAL | Status: DC

## 2011-12-31 MED ORDER — GUAIFENESIN ER 600 MG PO TB12: 600.0000 mg | ORAL_TABLET | Freq: Two times a day (BID) | ORAL | Status: AC

## 2011-12-31 MED ORDER — ALBUTEROL SULFATE (5 MG/ML) 0.5% IN NEBU: 2.5000 mg | INHALATION_SOLUTION | Freq: Two times a day (BID) | RESPIRATORY_TRACT | Status: DC

## 2011-12-31 MED ORDER — WARFARIN SODIUM 5 MG PO TABS
5.0000 mg | ORAL_TABLET | ORAL | Status: DC
  Administered 2011-12-31: 5 mg via ORAL
  Filled 2011-12-31: qty 1

## 2011-12-31 MED ORDER — IRBESARTAN 75 MG PO TABS: 75.0000 mg | ORAL_TABLET | Freq: Every day | ORAL | Status: DC

## 2011-12-31 MED ORDER — IRBESARTAN 75 MG PO TABS
75.0000 mg | ORAL_TABLET | Freq: Every day | ORAL | Status: DC
  Administered 2011-12-31: 75 mg via ORAL
  Filled 2011-12-31: qty 1

## 2011-12-31 MED ORDER — FOLIC ACID 1 MG PO TABS: 1.0000 mg | ORAL_TABLET | Freq: Every day | ORAL | Status: AC

## 2011-12-31 MED ORDER — IPRATROPIUM BROMIDE 0.02 % IN SOLN: 0.5000 mg | Freq: Two times a day (BID) | RESPIRATORY_TRACT | Status: DC

## 2011-12-31 MED ORDER — THIAMINE HCL 100 MG PO TABS: 100.0000 mg | ORAL_TABLET | Freq: Every day | ORAL | Status: DC

## 2011-12-31 MED ORDER — LACTULOSE 10 GM/15ML PO SOLN: 20.0000 g | Freq: Every day | ORAL | Status: DC

## 2011-12-31 NOTE — Progress Notes (Signed)
TELEMETRY: Reviewed telemetry pt in atrial flutter with rate 70s, no further bradycardia: Filed Vitals:   12/30/11 1902 12/30/11 2043 12/30/11 2153 12/31/11 0428  BP: 122/67 118/77  134/82  Pulse: 98 96  75  Temp:  98.2 F (36.8 C)  97.8 F (36.6 C)  TempSrc: Oral Oral  Oral  Resp: 16 16  16   Height:      Weight:      SpO2: 100% 96% 95% 94%    Intake/Output Summary (Last 24 hours) at 12/31/11 0653 Last data filed at 12/30/11 1100  Gross per 24 hour  Intake    462 ml  Output      0 ml  Net    462 ml    SUBJECTIVE Patient denies palpitations. No chest pain or SOB.  LABS: Basic Metabolic Panel: No results found for this basename: NA:2,K:2,CL:2,CO2:2,GLUCOSE:2,BUN:2,CREATININE:2,CALCIUM:2,MG:2,PHOS:2 in the last 72 hours Liver Function Tests: No results found for this basename: AST:2,ALT:2,ALKPHOS:2,BILITOT:2,PROT:2,ALBUMIN:2 in the last 72 hours No results found for this basename: LIPASE:2,AMYLASE:2 in the last 72 hours CBC:  Basename 12/31/11 0525 12/30/11 0710  WBC 9.1 10.1  NEUTROABS -- --  HGB 15.8 15.3  HCT 45.3 43.4  MCV 92.3 91.8  PLT 150 186   Thyroid Function Tests: No results found for this basename: TSH,T4TOTAL,FREET3,T3FREE,THYROIDAB in the last 72 hours  Radiology/Studies:  Dg Chest 2 View  12/23/2011  *RADIOLOGY REPORT*  Clinical Data: Back pain, groin pain, cough, chills, fever  CHEST - 2 VIEW  Comparison: 04/11/2011  Findings: Cardiomediastinal silhouette is stable.  No acute infiltrate or pleural effusion.  No pulmonary edema.  Bony thorax is stable.  IMPRESSION: No active disease.   Original Report Authenticated By: Natasha Mead, M.D.    Ct Chest W Contrast  12/24/2011  *RADIOLOGY REPORT*  Clinical Data: Pain and numbness in the groin for 2 days; cough. Back pain.  CT CHEST WITH CONTRAST  Technique:  Multidetector CT imaging of the chest was performed following the standard protocol during bolus administration of intravenous contrast.  Contrast: 75mL  OMNIPAQUE IOHEXOL 300 MG/ML  SOLN  Comparison: None.  Findings: Mild emphysema is noted at the upper lung lobes.  The lungs are otherwise clear.  There is no evidence of focal consolidation, pleural effusion or pneumothorax. No masses are identified.  Diffuse coronary artery calcifications are seen.  A 1.1 cm right paratracheal node is seen; aortopulmonary window nodes are borderline normal in size.  The mediastinum is otherwise unremarkable in appearance.  No pericardial effusion is identified. The great vessels are grossly unremarkable in appearance.  Air within the pulmonary outflow tract is transient in nature. Scattered calcification is noted along the thoracic and upper abdominal aorta.  No axillary lymphadenopathy is seen.  The visualized portions of the thyroid gland are unremarkable in appearance, aside from mild vague hypodensities, without a dominant mass.  The visualized portions of the liver and spleen are unremarkable.  No acute osseous abnormalities are identified.  IMPRESSION:  1.  Mild emphysema noted at the upper lung lobes.  Lungs otherwise clear. 2.  Diffuse coronary artery calcifications seen. 3.  1.1 cm right paratracheal node, nonspecific in appearance. 4.  Scattered calcification along the thoracic and upper abdominal aorta.   Original Report Authenticated By: Tonia Ghent, M.D.      PHYSICAL EXAM General: elderly, well nourished, in no acute distress. Head: Normocephalic, atraumatic, sclera non-icteric, no xanthomas, nares are without discharge. Poor dentition Neck: Negative for carotid bruits. JVD not elevated. Lungs: Clear. Breathing  is unlabored. Heart: IRRR, nl S1 S2 without murmurs, rubs, or gallops.  Abdomen: Soft, non-tender, non-distended with normoactive bowel sounds. No hepatomegaly. No rebound/guarding. No obvious abdominal masses. Msk:  Strength and tone appears normal for age. Extremities: No clubbing, cyanosis or edema.  Distal pedal pulses are 2+ and equal  bilaterally. Neuro: Alert and oriented X 3. Moves all extremities spontaneously. Psych:  Responds to questions appropriately with a flat affect.  ASSESSMENT AND PLAN: 1. Atrial flutter chronic- Rate now controlled on metoprolol alone at 25 mg bid. 2. ? LV thrombus. Repeat Echo with contrast showed no thrombus. On coumadin for Atrial flutter. INR therapeutic 3. Ischemic cardiomyopathy. EF 20-25%. On metoprolol. BP has been good. Will start low dose ARB. 4. CAD with known RCA occlusion. Moderate intermediate stenosis. Medical management. 5. PNA/bronchitis- per primary team 6. Tobacco abuse. Counseled on smoking cessation. 7. Encephalopathy.  Principal Problem:  *Dyspnea Active Problems:  Atrial flutter  CAD (coronary artery disease)  Ischemic cardiomyopathy  Tobacco abuse  HTN (hypertension)  Unspecified disorder of thyroid  Healthcare-associated pneumonia  Chronic systolic congestive heart failure, NYHA class 3  Dehydration  Metabolic encephalopathy  COPD, mild  Thrombocytopenia  Warfarin anticoagulation    Signed, Peter [place] MD,FACC 12/31/2011 6:53 AM

## 2011-12-31 NOTE — Discharge Summary (Signed)
Physician Discharge Summary  David [name] ZOX:[id number] DOB: 02-04-1940 DOA: 12/23/2011  PCP: Mikki Harbor  Admit date: 12/23/2011 Discharge date: 12/31/2011  Time spent: 40  minutes  Recommendations for Outpatient Follow-up:  Patient with chronic a flutter with episodes of bradycardia and hypotension.  Cardiac medications adjusted by [facility].  He will require follow up with them.  Patient with poor respiratory effort and still with coarse breath sounds.  Will need encouragement to cough / use spirometry. Will require BID nebulizer treatments.  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  Patient received 1 IM injection of Penicillin G and will need two additional doses to complete treatment for syphilis.  PT/INR needs to be checked on Friday 12/13 for possible coumadin dosing adjustment.  TSH, Free T3, Free T4 needs to be checked once patient has stabilized as an outpatient by his PCP.  Patient will be discharged with Home Health Services including RN, PT, OT, Speech, and Social work.    Discharge Diagnoses:  Principal Problem:  *Dyspnea Active Problems:  Healthcare-associated pneumonia  COPD exacerbation  Atrial flutter  Syphilis  CAD (coronary artery disease)  Ischemic cardiomyopathy  Tobacco abuse  HTN (hypertension)  Unspecified disorder of thyroid  Chronic systolic congestive heart failure, NYHA class 3  Dehydration  Metabolic encephalopathy  Thrombocytopenia  Warfarin anticoagulation   Discharge Condition: Stable will need frequent intermittent supervision as well as assistance for all ADLs, nebulizer treatment, medication management and feeding.  Diet recommendation: Aspiration risk, use aspiration precautions, Dyphagia 3 diet (mechanical soft) with thin liquids).  Heart healthy diet.  Filed Weights   12/25/11 2115 12/26/11 0553 12/30/11 1500  Weight: 65.59 kg (144 lb 9.6 oz) 65.273 kg (143 lb 14.4 oz) 64.728 kg (142 lb 11.2 oz)     History of present illness:  This is a 71 year old male with a history of alcohol abuse, colon cancer, altered mental status, migraines, and depression that we have  been asked to admit for pneumonia. Patient is awake alert, however, unable to get any detailed information as patient's short of breath and difficult to understand. Currently patient not very hypoxic, with a normal chest x-ray the patient is wheezing and quite rhonchorous. Patient lives in a independent living facility. He does smoke and he does have COPD.   Hospital Course:   PNA/bronchitis- The patient was initially started on IV antibiotic therapy for Health Care Associated Pneumonia.  However he never had fever, or elevated WBC.  Blood cultures were negative.  His antibiotics were de-escalated to levaquin and he completed a 7 day course of antibiotics.  His lungs demonstrated significant RALs initially but with scheduled nebulizers and antibiotics the RALs were replaced by loose wet breath sounds.  Given this history, I would presume this was more likely acute bronchitis in the setting of COPD/emphysema rather than pneumonia.  He will be discharged on continued nebulizer treatments.  [facility] chronic [facility].   Rate now controlled on metoprolol alone. Digoxin stopped 12/8. Metoprolol reduced to 25 mg bid. Episodes of bradycardia have been reduced.  Low dose ARB was started on 12/10 by cardiology.  On Chronic coumadin (currently theraputic)   ? LV thrombus. Repeat Echo with contrast showed no thrombus. On coumadin for Atrial flutter.   Ischemic cardiomyopathy. EF 20-25%. On metoprolol. Low dose ARB started 12/10.  CAD with known RCA occlusion. Moderate intermediate stenosis. Medical management.   Tobacco  abuse. Counseled on smoking cessation.   ?subclincial hypothyroidism  TSH low (0.242) .  Follow up as  outpatient.   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 Per [facility] he was reported positive in 2001 and 2009 but there is no documentation of treatment. They recommended empiric treatment x 3. Infectious disease Daiva Eves) agrees. We will order PCN G (Bicillin LA) 2.4 million units q 7 days x 3.   Encephalopathy.  Uncertain etiology. Patient was found to be confused throughout his hospitalization.  Even when he appeared alert his responses to questions were inaccurate.  MRI negative for stroke.  B12 and folate were within normal limits.  HIV antibody negative.   The patient does not have any nystagmus on exam but does have a long history of alcohol abuse. He was placed  on IV thiamine 100 mg 3 times a day for 2 days followed by 500 mg daily for next 3 days.   There is a very low possibility it is neuro syphilis given a titer of 1:2. Per Infectious Disease, Daiva Eves, the only way to definitively rule out neurosyphilis is with lumbar puncture. Not wanting to put the patient thru this risky procedure with such a low likelihood of neurosyphilis, we decided the best course of action is to treat his reactive RPR empirically and monitor for improvement. Daiva Eves recommended a repeat titer after treatment   Procedures: 2 D Echocardiogram Study Conclusions  - Left ventricle: The cavity size was normal. Wall thickness was normal. Systolic function was severely reduced. The estimated ejection fraction was in the range of 20% to 25%. Diffuse hypokinesis. Definity contrast was used. No LV apical thrombus was visualized. - Left atrium: The atrium was mildly dilated. - Right ventricle: The cavity size was normal. Systolic function was mildly to moderately reduced.    Consultations:  [facility]  Discharge Exam: Filed Vitals:   12/30/11 2043 12/30/11 2153 12/31/11 0428 12/31/11 0921  BP: 118/77  134/82   Pulse: 96  75   Temp: 98.2 F (36.8 C)  97.8 F (36.6 C)   TempSrc: Oral  Oral    Resp: 16  16   Height:      Weight:      SpO2: 96% 95% 94% 95%    General: Awake, NAD, appears comfortable.  Confused.  Speech is difficult to understand Cardiovascular: irreg irreg.  No m/r/g Respiratory: Wet coarse breath sounds that clear somewhat with cough.  No accessory muscle use Abdomen:  Thin, Soft, NT, ND, +BS Extremities:  Able to move all 4, no lower extremity edema.  Discharge Instructions      Discharge Orders    Future Orders Please Complete By Expires   Diet - low sodium heart healthy      Increase activity slowly          Medication List     As of 12/31/2011  1:20 PM    STOP taking these medications         carvedilol 3.125 MG tablet   Commonly known as: COREG      HYDROcodone-acetaminophen 5-325 MG per tablet   Commonly known as: NORCO/VICODIN      meloxicam 7.5 MG tablet   Commonly known as: MOBIC      QVAR 40 MCG/ACT inhaler   Generic drug: beclomethasone      senna 8.6 MG tablet   Commonly known as: SENOKOT      TAKE these medications  albuterol 108 (90 BASE) MCG/ACT inhaler   Commonly known as: PROVENTIL HFA;VENTOLIN HFA   Inhale 2 puffs into the lungs every 6 (six) hours as needed.      albuterol (5 MG/ML) 0.5% nebulizer solution   Commonly known as: PROVENTIL   Take 0.5 mLs (2.5 mg total) by nebulization 2 (two) times daily.      DULoxetine 60 MG capsule   Commonly known as: CYMBALTA   Take 60 mg by mouth daily.      folic acid 1 MG tablet   Commonly known as: FOLVITE   Take 1 tablet (1 mg total) by mouth daily.      guaiFENesin 600 MG 12 hr tablet   Commonly known as: MUCINEX   Take 1 tablet (600 mg total) by mouth 2 (two) times daily.      ipratropium 0.02 % nebulizer solution   Commonly known as: ATROVENT   Take 2.5 mLs (0.5 mg total) by nebulization 2 (two) times daily.      irbesartan 75 MG tablet   Commonly known as: AVAPRO   Take 1 tablet (75 mg total) by mouth daily.      lactulose 10 GM/15ML solution    Commonly known as: CHRONULAC   Take 30 mLs (20 g total) by mouth daily.      metoprolol tartrate 25 MG tablet   Commonly known as: LOPRESSOR   Take 1 tablet (25 mg total) by mouth 2 (two) times daily.      mirtazapine 15 MG tablet   Commonly known as: REMERON   Take 15 mg by mouth at bedtime.      multivitamin tablet   Take 1 tablet by mouth daily.      paliperidone 6 MG 24 hr tablet   Commonly known as: INVEGA   Take 6 mg by mouth every morning.      penicillin g benzathine 1200000 UNIT/2ML Susp injection   Commonly known as: BICILLIN LA   Inject 4 mLs (2.4 Million Units total) into the muscle once a week.      QUEtiapine 50 MG tablet   Commonly known as: SEROQUEL   Take 50 mg by mouth 2 (two) times daily.      rosuvastatin 20 MG tablet   Commonly known as: CRESTOR   Take 20 mg by mouth daily.      thiamine 100 MG tablet   Take 1 tablet (100 mg total) by mouth daily.      topiramate 25 MG tablet   Commonly known as: TOPAMAX   Take 25 mg by mouth at bedtime.      warfarin 5 MG tablet   Commonly known as: COUMADIN   Take 1 tablet (5 mg total) by mouth daily.      warfarin 7.5 MG tablet   Commonly known as: COUMADIN   Take 1 tablet (7.5 mg total) by mouth daily. On Sunday only.         Follow-up Information    Follow up with Mikki Harbor. Schedule an appointment as soon as possible for a visit in 2 weeks.   Contact information:   [address] [place] [address] [phone]       Follow up with Peter Swaziland, MD. Schedule an appointment as soon as possible for a visit in 4 weeks.   Contact information:   [address] [place] [address] [phone]           The results of significant diagnostics  from this hospitalization (including imaging, microbiology, ancillary and laboratory) are listed below for reference.    Significant Diagnostic Studies: Dg Chest 2 View  12/23/2011  *RADIOLOGY REPORT*  Clinical Data: Back pain,  groin pain, cough, chills, fever  CHEST - 2 VIEW  Comparison: 04/11/2011  Findings: Cardiomediastinal silhouette is stable.  No acute infiltrate or pleural effusion.  No pulmonary edema.  Bony thorax is stable.  IMPRESSION: No active disease.   Original Report Authenticated By: Natasha Mead, M.D.    Ct Chest W Contrast  12/24/2011  *RADIOLOGY REPORT*  Clinical Data: Pain and numbness in the groin for 2 days; cough. Back pain.  CT CHEST WITH CONTRAST  Technique:  Multidetector CT imaging of the chest was performed following the standard protocol during bolus administration of intravenous contrast.  Contrast: 75mL OMNIPAQUE IOHEXOL 300 MG/ML  SOLN  Comparison: None.  Findings: Mild emphysema is noted at the upper lung lobes.  The lungs are otherwise clear.  There is no evidence of focal consolidation, pleural effusion or pneumothorax. No masses are identified.  Diffuse coronary artery calcifications are seen.  A 1.1 cm right paratracheal node is seen; aortopulmonary window nodes are borderline normal in size.  The mediastinum is otherwise unremarkable in appearance.  No pericardial effusion is identified. The great vessels are grossly unremarkable in appearance.  Air within the pulmonary outflow tract is transient in nature. Scattered calcification is noted along the thoracic and upper abdominal aorta.  No axillary lymphadenopathy is seen.  The visualized portions of the thyroid gland are unremarkable in appearance, aside from mild vague hypodensities, without a dominant mass.  The visualized portions of the liver and spleen are unremarkable.  No acute osseous abnormalities are identified.  IMPRESSION:  1.  Mild emphysema noted at the upper lung lobes.  Lungs otherwise clear. 2.  Diffuse coronary artery calcifications seen. 3.  1.1 cm right paratracheal node, nonspecific in appearance. 4.  Scattered calcification along the thoracic and upper abdominal aorta.   Original Report Authenticated By: Tonia Ghent, M.D.     Mr Brain Wo Contrast  12/26/2011  *RADIOLOGY REPORT*  Clinical Data: Slurred speech with confusion.  MRI HEAD WITHOUT CONTRAST  Technique:  Multiplanar, multiecho pulse sequences of the brain and surrounding structures were obtained according to standard protocol without intravenous contrast.  Comparison: 10/15/2010 CT.  No comparison MR.  Findings: Motion degraded exam.  No acute infarct.  No intracranial hemorrhage.  Prominent small vessel disease type changes.  No intracranial mass lesion detected on this unenhanced exam.  Global atrophy without hydrocephalus.  Major intracranial vascular structures are patent.  Left vertebral artery appears congenitally small and there may be superimposed atherosclerotic type changes with narrowing distally.  Cervical medullary junction, pituitary region, pineal region and orbital structures unremarkable.  Minimal paranasal sinus mucosal thickening.  IMPRESSION: Motion degraded exam.  No acute infarct.  Prominent small vessel disease type changes.  Please see above.   Original Report Authenticated By: Lacy Duverney, M.D.     Microbiology: Recent Results (from the past 240 hour(s))  URINE CULTURE     Status: Normal   Collection Time   12/23/11  8:54 PM      Component Value Range Status Comment   Specimen Description URINE, CATHETERIZED   Final    Special Requests NONE   Final    Culture  Setup Time 12/23/2011 21:16   Final    Colony Count NO GROWTH   Final    Culture NO GROWTH  Final    Report Status 12/25/2011 FINAL   Final   CULTURE, BLOOD (ROUTINE X 2)     Status: Normal   Collection Time   12/23/11  9:54 PM      Component Value Range Status Comment   Specimen Description BLOOD RIGHT HAND   Final    Special Requests BOTTLES DRAWN AEROBIC ONLY 3CC   Final    Culture  Setup Time 12/24/2011 05:50   Final    Culture NO GROWTH 5 DAYS   Final    Report Status 12/30/2011 FINAL   Final   CULTURE, BLOOD (ROUTINE X 2)     Status: Normal   Collection Time    12/23/11 10:32 PM      Component Value Range Status Comment   Specimen Description BLOOD RIGHT HAND   Final    Special Requests BOTTLES DRAWN AEROBIC ONLY 10CC   Final    Culture  Setup Time 12/24/2011 05:50   Final    Culture NO GROWTH 5 DAYS   Final    Report Status 12/30/2011 FINAL   Final   CULTURE, BLOOD (ROUTINE X 2)     Status: Normal   Collection Time   12/24/11  1:30 AM      Component Value Range Status Comment   Specimen Description BLOOD RIGHT HAND   Final    Special Requests BOTTLES DRAWN AEROBIC ONLY 3CC   Final    Culture  Setup Time 12/24/2011 08:57   Final    Culture NO GROWTH 5 DAYS   Final    Report Status 12/30/2011 FINAL   Final   CULTURE, BLOOD (ROUTINE X 2)     Status: Normal   Collection Time   12/24/11  1:43 AM      Component Value Range Status Comment   Specimen Description BLOOD RIGHT ARM   Final    Special Requests BOTTLES DRAWN AEROBIC ONLY 3CC   Final    Culture  Setup Time 12/24/2011 08:57   Final    Culture NO GROWTH 5 DAYS   Final    Report Status 12/30/2011 FINAL   Final   MRSA PCR SCREENING     Status: Normal   Collection Time   12/24/11  3:00 AM      Component Value Range Status Comment   MRSA by PCR NEGATIVE  NEGATIVE Final   MRSA PCR SCREENING     Status: Normal   Collection Time   12/25/11 12:27 PM      Component Value Range Status Comment   MRSA by PCR NEGATIVE  NEGATIVE Final      Labs: Basic Metabolic Panel:  Lab 12/25/11 1610  NA 139  K 4.0  CL 106  CO2 26  GLUCOSE 102*  BUN 8  CREATININE 0.62  CALCIUM 9.1  MG --  PHOS --   CBC:  Lab 12/31/11 0525 12/30/11 0710 12/29/11 0640 12/28/11 0528 12/27/11 0550  WBC 9.1 10.1 8.9 6.8 6.3  NEUTROABS -- -- -- -- --  HGB 15.8 15.3 14.9 15.4 13.9  HCT 45.3 43.4 43.6 44.5 41.5  MCV 92.3 91.8 91.6 92.7 93.3  PLT 150 186 147* 152 142*       SignedConley Canal  [facility] [phone] 12/31/2011, 1:20 PM

## 2011-12-31 NOTE — Progress Notes (Signed)
Covering Clinical Child psychotherapist (CSW) provided facility with revised FL2 and contacted [facility] for transport as facility was no longer able to provide transportation. Amy at [facility] confirmed pt okay to transport. CSW has informed RN, CSW signing off.  Theresia Bough, MSW, Theresia Majors [phone]

## 2011-12-31 NOTE — Progress Notes (Signed)
ANTICOAGULATION CONSULT NOTE - Follow Up Consult  Pharmacy Consult for Coumadin, Levaquin Indication: atrial fibrillation and suspected apical thrombus, AECOPD  Allergies  Allergen Reactions  . Oat Flour (Oat)    Patient Measurements: Height: 6' (182.9 cm) Weight: 142 lb 11.2 oz (64.728 kg) IBW/kg (Calculated) : 77.6  Heparin Dosing Weight: 64.4 Kg  Vital Signs: Temp: 97.8 F (36.6 C) (12/10 0428) Temp src: Oral (12/10 0428) BP: 134/82 mmHg (12/10 0428) Pulse Rate: 75  (12/10 0428)  Labs:  Basename 12/31/11 0525 12/30/11 0710 12/29/11 0640  HGB 15.8 15.3 --  HCT 45.3 43.4 43.6  PLT 150 186 147*  APTT -- -- --  LABPROT 26.5* 31.9* 32.0*  INR 2.59* 3.33* 3.34*  HEPARINUNFRC -- -- --  CREATININE -- -- --  CKTOTAL -- -- --  CKMB -- -- --  TROPONINI -- -- --   Lab Results  Component Value Date   WBC 9.1 12/31/2011    Estimated Creatinine Clearance: 77.5 ml/min (by C-G formula based on Cr of 0.62).  Assessment: David [name] continues on Coumadin for aflutter and possible apical thrombus.  INR now therapeutic this am.  H/H stable, PTLC imropved, no bleeding noted.   HOME dose:  Coumadin dose 5 mg daily except 7.5 on Sun. INR on admit 1.43.  Drug/Drug Interactions:  Levaquin and Warfarin Patient is also on Levaquin for AECOPD. Noted he remains afebrile, WBC is nml. Levaquin may potentiate INR.  Zosyn 12/3>>12/5 Vanc 12/3>>12/5 Levaquin 12/3>>??  12/2 UC>> neg 12/2, 12/3 BC X 4>>ngtd 12/3: MRSA pcr: neg  Goal of Therapy:  INR 2-3 Monitor platelets by anticoagulation protocol: Yes   Plan:  - Resume home Coumadin 5 mg daily except 7.5 mg Sunday.  If d/c today, consider close check on INR with anticipated d/c of levofloxacin and fact that INR low on admit on home dose.  - Will f/up daily PT/INR - Consider d/c levofloxacin, today is day #10  Teriann Livingood L. Illene Bolus, PharmD, BCPS Clinical Pharmacist Pager: [phone] Pharmacy: [phone] 12/31/2011 9:52 AM

## 2011-12-31 NOTE — Progress Notes (Signed)
Report called to Lurena Joiner at [facility]

## 2011-12-31 NOTE — Evaluation (Signed)
Occupational Therapy Evaluation Patient Details Name: David [name] MRN: [id number] DOB: 1940-02-09 Today's Date: 12/31/2011 Time: 4098-1191 OT Time Calculation (min): 24 min  OT Assessment / Plan / Recommendation Clinical Impression  72 year old male patient of an assisted living facility normally quite independent and alert and oriented according to staff. He was sent to the emergency department because of apparent shortness of breath and difficulty in staff understanding what he was saying.  Admitted for COPD exacerbation.   Pt presents to OT today with confusion and generalized weakness which impact independence and safety with ADLs. Pt will benefit from skilled OT in the acute setting to maximize I with ADL and ADL mobility prior to d/c. If ALF cannot provide frequent, intermittent supervision as well as assist for ADLs, pt will need to d/c to SNF.  **Pt reports he feels he needs glasses and has for some time now. However, he has not been to optometrist to be examined. Did not test vision as pt asked if he could defer this.    OT Assessment  Patient needs continued OT Services    Follow Up Recommendations  Home health OT;Supervision - Intermittent    Barriers to Discharge      Equipment Recommendations  None recommended by OT    Recommendations for Other Services    Frequency  Min 2X/week    Precautions / Restrictions Precautions Precautions: Fall   Pertinent Vitals/Pain Pt with no c/o pain    ADL  Grooming: Performed;Supervision/safety Where Assessed - Grooming: Unsupported standing Upper Body Dressing: Set up;Supervision/safety Where Assessed - Upper Body Dressing: Unsupported sitting Lower Body Dressing: Minimal assistance Where Assessed - Lower Body Dressing: Unsupported sit to stand Toilet Transfer: Min guard Toilet Transfer Method: Sit to Barista: Regular height toilet;Grab bars Toileting - Architect and Hygiene: Min  guard Where Assessed - Engineer, mining and Hygiene: Standing Equipment Used: Gait belt Transfers/Ambulation Related to ADLs: Min guard A/S with ambulation. Slow, shuffling gait but no LOB. No AD used ADL Comments: Pt with flat affect and moves slowly.     OT Diagnosis: Generalized weakness;Cognitive deficits;Altered mental status  OT Problem List: Decreased strength;Decreased activity tolerance;Impaired balance (sitting and/or standing);Decreased safety awareness;Decreased knowledge of use of DME or AE;Decreased cognition;Decreased knowledge of precautions OT Treatment Interventions: Self-care/ADL training;Patient/family education;Balance training;Cognitive remediation/compensation;Therapeutic activities   OT Goals Acute Rehab OT Goals OT Goal Formulation: With patient Time For Goal Achievement: 01/07/12 Potential to Achieve Goals: Good ADL Goals Pt Will Perform Grooming: Independently;Standing at sink ADL Goal: Grooming - Progress: Goal set today Pt Will Perform Upper Body Dressing: with set-up;Sitting, bed;Sitting, chair ADL Goal: Upper Body Dressing - Progress: Goal set today Pt Will Perform Lower Body Dressing: with set-up;Sit to stand from bed;Sit to stand from chair ADL Goal: Lower Body Dressing - Progress: Goal set today Pt Will Transfer to Toilet: Independently;Ambulation;Comfort height toilet ADL Goal: Toilet Transfer - Progress: Goal set today Pt Will Perform Toileting - Clothing Manipulation: Independently;Standing ADL Goal: Toileting - Clothing Manipulation - Progress: Goal set today Pt Will Perform Tub/Shower Transfer: Shower transfer;with supervision;Ambulation ADL Goal: Tub/Shower Transfer - Progress: Goal set today  Visit Information  Last OT Received On: 12/31/11 Assistance Needed: +1    Subjective Data  Subjective: I need to go to the bathroom Patient Stated Goal: "Go home"   Prior Functioning     Home Living Type of Home: Assisted  living Home Access: Level entry Home Layout: One level Home Adaptive Equipment: None Additional Comments: questionable  historian for PLOF/ALF setup Prior Function Level of Independence: Independent (per notes in chart) Driving: No Communication Communication: Expressive difficulties Dominant Hand: Right         Vision/Perception Vision - Assessment Additional Comments: pt states he feels he needs glasses and has for some time now, but has not been to the optometrist to be examined.    Cognition  Arousal/Alertness: Awake/alert Orientation Level: Disoriented to;Place;Time;Situation Behavior During Session: Flat affect Current Attention Level: Sustained Following Commands: Follows one step commands with increased time;Follows one step commands consistently;Follows multi-step commands with increased time Cognition - Other Comments: slow processing    Extremity/Trunk Assessment Right Upper Extremity Assessment RUE ROM/Strength/Tone: Deficits RUE ROM/Strength/Tone Deficits: generally weak/grossly 4/5 Left Upper Extremity Assessment LUE ROM/Strength/Tone: Deficits LUE ROM/Strength/Tone Deficits: generally weak/grossly 4/5     Mobility Bed Mobility Supine to Sit: 5: Supervision;HOB flat;With rails     Shoulder Instructions     Exercise     Balance     End of Session OT - End of Session Equipment Utilized During Treatment: Gait belt Activity Tolerance: Patient tolerated treatment well Patient left: in chair;with call bell/phone within reach Nurse Communication: Mobility status  GO     Tyron Manetta 12/31/2011, 9:27 AM

## 2011-12-31 NOTE — Discharge Summary (Signed)
Patient seen and examined and agree with assessment and plan outlined in there discharge summary. Plan on treating with 3 doses of IM penicillin and monitor. Not planned for LP at this time to confirm given no clear record of him being treated in the past and also due the fact that patient will need to be off coumadin while he has finally reached a therapeutic INR level for his Afib with RVR.

## 2011-12-31 NOTE — Progress Notes (Signed)
Speech Language Pathology Dysphagia Treatment Patient Details Name: David [name] MRN: [id number] DOB: [birth year] Today's Date: 12/31/2011 Time: 4098-1191 SLP Time Calculation (min): 15 min  Assessment / Plan / Recommendation Clinical Impression  Pt again seen for reinforcement of compensatory strategies. Pt continues to evidence trace penetration with one probable episode of sensed frank penetration/aspraiton with a larger sips. Moderate verbal cues provided with pt return demonstrating in 100% of trials, but able to perform independently. When asked if he could recall throat clear strategy next time he has something to drink he replied "No, I get mixed up."  SLP placed visual cues in room to help cue pt. Pt does appear to tolerate diet with no temperature spikes. Continue soft diet with thin liquids at d/c with f/u from SLP at next level of care.     Diet Recommendation  Continue with Current Diet: Dysphagia 3 (mechanical soft);Thin liquid    SLP Plan Continue with current plan of care   Pertinent Vitals/Pain NA   Swallowing Goals  SLP Swallowing Goals Patient will consume recommended diet without observed clinical signs of aspiration with: Supervision/safety Swallow Study Goal #1 - Progress: Progressing toward goal Patient will utilize recommended strategies during swallow to increase swallowing safety with: Minimal cueing Swallow Study Goal #2 - Progress: Progressing toward goal  General Temperature Spikes Noted: No Respiratory Status: Room air Behavior/Cognition: Alert;Cooperative;Requires cueing Oral Cavity - Dentition: Poor condition;Missing dentition;Edentulous Patient Positioning: Upright in chair  Oral Cavity - Oral Hygiene Does patient have any of the following "at risk" factors?: None of the above Brush patient's teeth BID with toothbrush (using toothpaste with fluoride): Yes   Dysphagia Treatment Treatment focused on: Utilization of compensatory  strategies;Patient/family/caregiver education Treatment Methods/Modalities: Skilled observation Patient observed directly with PO's: Yes Type of PO's observed: Thin liquids Feeding: Able to feed self Liquids provided via: Cup Pharyngeal Phase Signs & Symptoms: Wet vocal quality;Immediate cough Type of cueing: Verbal Amount of cueing: Moderate   GO     David [name], Riley Nearing 12/31/2011, 10:06 AM

## 2011-12-31 NOTE — Progress Notes (Signed)
Clinical Social Worker spoke with David [name] at [facility] and confirmed pt's dc for today.  CSW submitted appropriate clinical information.  Facility states they are able to transport pt from hospital to facility.  CSW to sign off at dc.    Angelia Mould, MSW, [place] [phone]

## 2011-12-31 NOTE — Progress Notes (Signed)
Pt had 12 beats vtach, asymptomatic bp 108/62, hr then 102. Pt sitting comfortably in chair. Dr. Roanna Epley made aware. Will cont to monitor

## 2012-01-01 MED FILL — Perflutren Lipid Microsphere IV Susp 1.1 MG/ML: INTRAVENOUS | Qty: 10 | Status: AC

## 2012-01-13 ENCOUNTER — Ambulatory Visit (INDEPENDENT_AMBULATORY_CARE_PROVIDER_SITE_OTHER): Payer: PRIVATE HEALTH INSURANCE | Admitting: *Deleted

## 2012-01-13 DIAGNOSIS — I4892 Unspecified atrial flutter: Secondary | ICD-10-CM

## 2012-02-10 ENCOUNTER — Ambulatory Visit (INDEPENDENT_AMBULATORY_CARE_PROVIDER_SITE_OTHER): Payer: PRIVATE HEALTH INSURANCE | Admitting: *Deleted

## 2012-02-10 DIAGNOSIS — I4892 Unspecified atrial flutter: Secondary | ICD-10-CM

## 2012-02-10 LAB — POCT INR: INR: 2.2

## 2012-04-06 ENCOUNTER — Ambulatory Visit (INDEPENDENT_AMBULATORY_CARE_PROVIDER_SITE_OTHER): Payer: PRIVATE HEALTH INSURANCE | Admitting: Cardiology

## 2012-04-06 ENCOUNTER — Ambulatory Visit (INDEPENDENT_AMBULATORY_CARE_PROVIDER_SITE_OTHER): Payer: PRIVATE HEALTH INSURANCE | Admitting: *Deleted

## 2012-04-06 ENCOUNTER — Encounter: Payer: Self-pay | Admitting: Cardiology

## 2012-04-06 VITALS — BP 80/56 | HR 76 | Ht 72.0 in | Wt 142.0 lb

## 2012-04-06 DIAGNOSIS — Z7901 Long term (current) use of anticoagulants: Secondary | ICD-10-CM

## 2012-04-06 DIAGNOSIS — I4892 Unspecified atrial flutter: Secondary | ICD-10-CM

## 2012-04-06 LAB — POCT INR: INR: 2.4

## 2012-04-06 NOTE — Progress Notes (Signed)
David [name] Date of Birth: [birth year] Medical Record #[id number]  History of Present Illness: Mr. Kristiansen is seen for follow up.  He  has a history of ischemic cardiomyopathy with ejection fraction of 20-30%. Prior cardiac catheterization demonstrated occlusion of the right coronary with moderate disease in an intermediate branch. He also has chronic atrial flutter. He had cardioversion in 2008 but has since had recurrence. He has ongoing tobacco use. He is on chronic anticoagulation and has been followed in our Coumadin clinic. He lives in assisted living. He was hospitalized in December with pneumonia. He he had episodes of atrial flutter with rapid ventricular response alternating with bradycardia. Digoxin was discontinued. He was placed on metoprolol. Carvedilol was discontinued.  On followup today he does not know what medications he is taking. He has not had his Coumadin checked since January. According to his facility he had lab work done recently. Medications were confirmed with his facility and he is taking both metoprolol and carvedilol. Patient denies any chest pain, shortness of breath, palpitations, or dizziness. He has lost 11 pounds over the past year.  Current Outpatient Prescriptions on File Prior to Visit  Medication Sig Dispense Refill  . albuterol (PROVENTIL HFA;VENTOLIN HFA) 108 (90 BASE) MCG/ACT inhaler Inhale 2 puffs into the lungs every 6 (six) hours as needed.      Marland Kitchen albuterol (PROVENTIL) (5 MG/ML) 0.5% nebulizer solution Take 0.5 mLs (2.5 mg total) by nebulization 2 (two) times daily.  20 mL  1  . DULoxetine (CYMBALTA) 60 MG capsule Take 60 mg by mouth daily.      . folic acid (FOLVITE) 1 MG tablet Take 1 tablet (1 mg total) by mouth daily.  30 tablet  1  . guaiFENesin (MUCINEX) 600 MG 12 hr tablet Take 1 tablet (600 mg total) by mouth 2 (two) times daily.      Marland Kitchen ipratropium (ATROVENT) 0.02 % nebulizer solution Take 2.5 mLs (0.5 mg total) by nebulization 2 (two) times  daily.  75 mL  2  . irbesartan (AVAPRO) 75 MG tablet Take 1 tablet (75 mg total) by mouth daily.  30 tablet  1  . lactulose (CHRONULAC) 10 GM/15ML solution Take 30 mLs (20 g total) by mouth daily.  240 mL  0  . metoprolol tartrate (LOPRESSOR) 25 MG tablet Take 1 tablet (25 mg total) by mouth 2 (two) times daily.  60 tablet  0  . mirtazapine (REMERON) 15 MG tablet Take 15 mg by mouth at bedtime.      . Multiple Vitamin (MULTIVITAMIN) tablet Take 1 tablet by mouth daily.      . paliperidone (INVEGA) 6 MG 24 hr tablet Take 6 mg by mouth every morning.      . penicillin g benzathine (BICILLIN LA) 1200000 UNIT/2ML SUSP injection Inject 4 mLs (2.4 Million Units total) into the muscle once a week.  2 mL  1  . QUEtiapine (SEROQUEL) 50 MG tablet Take 50 mg by mouth 2 (two) times daily.      . rosuvastatin (CRESTOR) 20 MG tablet Take 20 mg by mouth daily.      Marland Kitchen thiamine 100 MG tablet Take 1 tablet (100 mg total) by mouth daily.  30 tablet  1  . topiramate (TOPAMAX) 25 MG tablet Take 25 mg by mouth at bedtime.       Marland Kitchen warfarin (COUMADIN) 5 MG tablet Take 1 tablet (5 mg total) by mouth daily.      Marland Kitchen warfarin (COUMADIN) 7.5 MG tablet Take  1 tablet (7.5 mg total) by mouth daily. On Sunday only.       No current facility-administered medications on file prior to visit.    Allergies  Allergen Reactions  . Oat Flour (Oat)     Past Medical History  Diagnosis Date  . Community acquired pneumonia     Right middle lobe community-acquired pneumonia  . Toxic metabolic encephalopathy   . Chronic atrial flutter   . Tobacco abuse   . History of coronary artery disease 2008    Cath at [facility]: RCA 95%, RI 60%  - medical therapy  . Ischemic cardiomyopathy   . History of prostate cancer   . Chronic obstructive pulmonary disease     with mild bronchospasm, improved  . Hypertension   . History of alcohol abuse     History of prior heavy alcohol abuse  . History of depression   . Altered mental  status 2012   . Noncompliance   . Depression   . Ulcer   . Hx of migraines   . Hyperlipidemia   . Colon cancer     Past Surgical History  Procedure Laterality Date  . Appendectomy      History  Smoking status  . Current Every Day Smoker -- 0.50 packs/day for 40 years  . Types: Cigarettes  Smokeless tobacco  . Never Used    History  Alcohol Use  . Yes    Family History  Problem Relation Age of Onset  . Cancer Father     Prostate Cancer  . Heart disease Other     Parent  . Alcohol abuse Other     Parent  . Arthritis Other     parent  . Hypertension Other     Parent    Review of Systems: As noted in history of present illness. All other systems were reviewed and are negative.  Physical Exam: BP 80/56  Pulse 76  Ht 6' (1.829 m)  Wt 142 lb (64.411 kg)  BMI 19.25 kg/m2  SpO2 94% He is a frail black male in no acute distress. He is normocephalic, atraumatic. Pupils are equal round and reactive to light and accommodation. His mucous membranes are moist. Dentition is in poor repair. Neck is supple no JVD, adenopathy, thyromegaly, or bruits. Cardiac exam reveals an irregular rate and rhythm with a grade 2/6 holosystolic murmur at the apex. There is no S3. Abdomen is soft and nontender without masses or ascites. Extremities are without edema. He has poor pedal pulses. Skin is dry. He is alert and oriented x3. His mood is appropriate. Cranial nerves II through XII are intact. LABORATORY DATA:   Assessment / Plan: 1. Atrial flutter. Rate is well controlled today. He is hypotensive so we will stop his carvedilol and continue metoprolol only.  2. Anticoagulation. He is due for a Coumadin check. We will try and check this today.  3. Tobacco abuse.  4. 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 Patient is asymptomatic.  5. Chronic systolic congestive heart failure with ejection fraction of 20-25%. Blood pressure is too low to consider ACE inhibitor  or ARB. He is euvolemic.

## 2012-04-06 NOTE — Patient Instructions (Signed)
Stop Coreg. Continue your other therapy.  We will check your coumadin  I will see you in 6 months.

## 2012-04-21 ENCOUNTER — Encounter: Payer: Self-pay | Admitting: Cardiology

## 2012-05-04 ENCOUNTER — Ambulatory Visit (INDEPENDENT_AMBULATORY_CARE_PROVIDER_SITE_OTHER): Payer: PRIVATE HEALTH INSURANCE | Admitting: Pharmacist

## 2012-05-04 DIAGNOSIS — I4892 Unspecified atrial flutter: Secondary | ICD-10-CM

## 2012-06-16 ENCOUNTER — Ambulatory Visit (INDEPENDENT_AMBULATORY_CARE_PROVIDER_SITE_OTHER): Payer: PRIVATE HEALTH INSURANCE | Admitting: Pharmacist

## 2012-06-16 DIAGNOSIS — I4892 Unspecified atrial flutter: Secondary | ICD-10-CM

## 2012-07-27 ENCOUNTER — Ambulatory Visit (INDEPENDENT_AMBULATORY_CARE_PROVIDER_SITE_OTHER): Payer: PRIVATE HEALTH INSURANCE | Admitting: *Deleted

## 2012-07-27 DIAGNOSIS — I4892 Unspecified atrial flutter: Secondary | ICD-10-CM

## 2012-07-27 LAB — POCT INR: INR: 2

## 2012-09-07 ENCOUNTER — Ambulatory Visit (INDEPENDENT_AMBULATORY_CARE_PROVIDER_SITE_OTHER): Payer: PRIVATE HEALTH INSURANCE

## 2012-09-07 DIAGNOSIS — I4892 Unspecified atrial flutter: Secondary | ICD-10-CM

## 2012-09-07 LAB — POCT INR: INR: 1.3

## 2012-09-18 ENCOUNTER — Ambulatory Visit (INDEPENDENT_AMBULATORY_CARE_PROVIDER_SITE_OTHER): Payer: PRIVATE HEALTH INSURANCE

## 2012-09-18 DIAGNOSIS — I4892 Unspecified atrial flutter: Secondary | ICD-10-CM

## 2012-09-19 ENCOUNTER — Emergency Department (HOSPITAL_COMMUNITY): Payer: PRIVATE HEALTH INSURANCE

## 2012-09-19 ENCOUNTER — Inpatient Hospital Stay (HOSPITAL_COMMUNITY): Payer: PRIVATE HEALTH INSURANCE

## 2012-09-19 ENCOUNTER — Inpatient Hospital Stay (HOSPITAL_COMMUNITY)
Admission: EM | Admit: 2012-09-19 | Discharge: 2012-09-25 | DRG: 871 | Disposition: A | Discharge status: Other Acute Inpatient Hospital | Payer: PRIVATE HEALTH INSURANCE | Attending: Internal Medicine | Admitting: Internal Medicine

## 2012-09-19 ENCOUNTER — Encounter (HOSPITAL_COMMUNITY): Payer: Self-pay | Admitting: Emergency Medicine

## 2012-09-19 DIAGNOSIS — I509 Heart failure, unspecified: Secondary | ICD-10-CM | POA: Diagnosis present

## 2012-09-19 DIAGNOSIS — A419 Sepsis, unspecified organism: Principal | ICD-10-CM | POA: Diagnosis present

## 2012-09-19 DIAGNOSIS — J439 Emphysema, unspecified: Secondary | ICD-10-CM | POA: Diagnosis present

## 2012-09-19 DIAGNOSIS — F3289 Other specified depressive episodes: Secondary | ICD-10-CM | POA: Diagnosis present

## 2012-09-19 DIAGNOSIS — J4489 Other specified chronic obstructive pulmonary disease: Secondary | ICD-10-CM | POA: Diagnosis present

## 2012-09-19 DIAGNOSIS — N179 Acute kidney failure, unspecified: Secondary | ICD-10-CM | POA: Diagnosis present

## 2012-09-19 DIAGNOSIS — Z66 Do not resuscitate: Secondary | ICD-10-CM | POA: Diagnosis present

## 2012-09-19 DIAGNOSIS — I2510 Atherosclerotic heart disease of native coronary artery without angina pectoris: Secondary | ICD-10-CM

## 2012-09-19 DIAGNOSIS — F17200 Nicotine dependence, unspecified, uncomplicated: Secondary | ICD-10-CM | POA: Diagnosis present

## 2012-09-19 DIAGNOSIS — Z7901 Long term (current) use of anticoagulants: Secondary | ICD-10-CM

## 2012-09-19 DIAGNOSIS — E039 Hypothyroidism, unspecified: Secondary | ICD-10-CM | POA: Diagnosis present

## 2012-09-19 DIAGNOSIS — I472 Ventricular tachycardia, unspecified: Secondary | ICD-10-CM | POA: Diagnosis not present

## 2012-09-19 DIAGNOSIS — D649 Anemia, unspecified: Secondary | ICD-10-CM | POA: Diagnosis present

## 2012-09-19 DIAGNOSIS — G92 Toxic encephalopathy: Secondary | ICD-10-CM | POA: Diagnosis present

## 2012-09-19 DIAGNOSIS — G929 Unspecified toxic encephalopathy: Secondary | ICD-10-CM | POA: Diagnosis present

## 2012-09-19 DIAGNOSIS — Z720 Tobacco use: Secondary | ICD-10-CM

## 2012-09-19 DIAGNOSIS — J449 Chronic obstructive pulmonary disease, unspecified: Secondary | ICD-10-CM

## 2012-09-19 DIAGNOSIS — Z9119 Patient's noncompliance with other medical treatment and regimen: Secondary | ICD-10-CM

## 2012-09-19 DIAGNOSIS — I5022 Chronic systolic (congestive) heart failure: Secondary | ICD-10-CM | POA: Diagnosis present

## 2012-09-19 DIAGNOSIS — R05 Cough: Secondary | ICD-10-CM

## 2012-09-19 DIAGNOSIS — F0390 Unspecified dementia without behavioral disturbance: Secondary | ICD-10-CM | POA: Diagnosis present

## 2012-09-19 DIAGNOSIS — I498 Other specified cardiac arrhythmias: Secondary | ICD-10-CM | POA: Diagnosis not present

## 2012-09-19 DIAGNOSIS — I2589 Other forms of chronic ischemic heart disease: Secondary | ICD-10-CM | POA: Diagnosis present

## 2012-09-19 DIAGNOSIS — Z8546 Personal history of malignant neoplasm of prostate: Secondary | ICD-10-CM

## 2012-09-19 DIAGNOSIS — F329 Major depressive disorder, single episode, unspecified: Secondary | ICD-10-CM | POA: Diagnosis present

## 2012-09-19 DIAGNOSIS — I4891 Unspecified atrial fibrillation: Secondary | ICD-10-CM | POA: Diagnosis not present

## 2012-09-19 DIAGNOSIS — E785 Hyperlipidemia, unspecified: Secondary | ICD-10-CM | POA: Diagnosis present

## 2012-09-19 DIAGNOSIS — Z681 Body mass index (BMI) 19 or less, adult: Secondary | ICD-10-CM

## 2012-09-19 DIAGNOSIS — E860 Dehydration: Secondary | ICD-10-CM | POA: Diagnosis present

## 2012-09-19 DIAGNOSIS — I4729 Other ventricular tachycardia: Secondary | ICD-10-CM | POA: Diagnosis not present

## 2012-09-19 DIAGNOSIS — D696 Thrombocytopenia, unspecified: Secondary | ICD-10-CM | POA: Diagnosis present

## 2012-09-19 DIAGNOSIS — A539 Syphilis, unspecified: Secondary | ICD-10-CM

## 2012-09-19 DIAGNOSIS — Z79899 Other long term (current) drug therapy: Secondary | ICD-10-CM

## 2012-09-19 DIAGNOSIS — Z85038 Personal history of other malignant neoplasm of large intestine: Secondary | ICD-10-CM

## 2012-09-19 DIAGNOSIS — I255 Ischemic cardiomyopathy: Secondary | ICD-10-CM | POA: Diagnosis present

## 2012-09-19 DIAGNOSIS — E46 Unspecified protein-calorie malnutrition: Secondary | ICD-10-CM | POA: Diagnosis present

## 2012-09-19 DIAGNOSIS — I10 Essential (primary) hypertension: Secondary | ICD-10-CM | POA: Diagnosis present

## 2012-09-19 DIAGNOSIS — R059 Cough, unspecified: Secondary | ICD-10-CM

## 2012-09-19 DIAGNOSIS — R7309 Other abnormal glucose: Secondary | ICD-10-CM | POA: Diagnosis present

## 2012-09-19 DIAGNOSIS — I4892 Unspecified atrial flutter: Secondary | ICD-10-CM | POA: Diagnosis present

## 2012-09-19 DIAGNOSIS — J189 Pneumonia, unspecified organism: Secondary | ICD-10-CM | POA: Diagnosis present

## 2012-09-19 DIAGNOSIS — J9601 Acute respiratory failure with hypoxia: Secondary | ICD-10-CM | POA: Diagnosis present

## 2012-09-19 DIAGNOSIS — G9341 Metabolic encephalopathy: Secondary | ICD-10-CM | POA: Diagnosis present

## 2012-09-19 DIAGNOSIS — J9600 Acute respiratory failure, unspecified whether with hypoxia or hypercapnia: Secondary | ICD-10-CM | POA: Diagnosis present

## 2012-09-19 DIAGNOSIS — E876 Hypokalemia: Secondary | ICD-10-CM | POA: Diagnosis present

## 2012-09-19 DIAGNOSIS — Z91199 Patient's noncompliance with other medical treatment and regimen due to unspecified reason: Secondary | ICD-10-CM

## 2012-09-19 DIAGNOSIS — R509 Fever, unspecified: Secondary | ICD-10-CM

## 2012-09-19 DIAGNOSIS — J441 Chronic obstructive pulmonary disease with (acute) exacerbation: Secondary | ICD-10-CM

## 2012-09-19 LAB — BLOOD GAS, ARTERIAL
Acid-base deficit: 6.3 mmol/L — ABNORMAL HIGH (ref 0.0–2.0)
O2 Content: 2 L/min
O2 Saturation: 93.3 %
Patient temperature: 98.6
TCO2: 20.1 mmol/L (ref 0–100)
pCO2 arterial: 39.1 mmHg (ref 35.0–45.0)

## 2012-09-19 LAB — CBC
HCT: 38.8 % — ABNORMAL LOW (ref 39.0–52.0)
Hemoglobin: 14.1 g/dL (ref 13.0–17.0)
MCHC: 36.3 g/dL — ABNORMAL HIGH (ref 30.0–36.0)
RBC: 4.24 MIL/uL (ref 4.22–5.81)
WBC: 11.4 10*3/uL — ABNORMAL HIGH (ref 4.0–10.5)

## 2012-09-19 LAB — TROPONIN I: Troponin I: <0.3 ng/mL (ref <0.30)

## 2012-09-19 LAB — COMPREHENSIVE METABOLIC PANEL
ALT: 32 U/L (ref 0–53)
Alkaline Phosphatase: 58 U/L (ref 39–117)
BUN: 33 mg/dL — ABNORMAL HIGH (ref 6–23)
CO2: 23 mEq/L (ref 19–32)
Chloride: 96 mEq/L (ref 96–112)
GFR calc Af Amer: 51 mL/min — ABNORMAL LOW (ref >90)
GFR calc non Af Amer: 44 mL/min — ABNORMAL LOW (ref >90)
Glucose, Bld: 117 mg/dL — ABNORMAL HIGH (ref 70–99)
Potassium: 4.3 mEq/L (ref 3.5–5.1)
Sodium: 130 mEq/L — ABNORMAL LOW (ref 135–145)
Total Bilirubin: 1.1 mg/dL (ref 0.3–1.2)
Total Protein: 6.9 g/dL (ref 6.0–8.3)

## 2012-09-19 LAB — POCT I-STAT, CHEM 8
BUN: 31 mg/dL — ABNORMAL HIGH (ref 6–23)
Chloride: 102 mEq/L (ref 96–112)
Glucose, Bld: 116 mg/dL — ABNORMAL HIGH (ref 70–99)
HCT: 40 % (ref 39.0–52.0)
Potassium: 5 mEq/L (ref 3.5–5.1)

## 2012-09-19 LAB — URINALYSIS, ROUTINE W REFLEX MICROSCOPIC
Bilirubin Urine: NEGATIVE
Glucose, UA: NEGATIVE mg/dL
Ketones, ur: 15 mg/dL — AB
Nitrite: NEGATIVE
Nitrite: NEGATIVE
Protein, ur: 30 mg/dL — AB
Specific Gravity, Urine: 1.003 — ABNORMAL LOW (ref 1.005–1.030)
Urobilinogen, UA: 2 mg/dL — ABNORMAL HIGH (ref 0.0–1.0)
pH: 5.5 (ref 5.0–8.0)

## 2012-09-19 LAB — GLUCOSE, CAPILLARY

## 2012-09-19 LAB — PROTIME-INR
INR: 1.9 — ABNORMAL HIGH (ref 0.00–1.49)
Prothrombin Time: 21.2 seconds — ABNORMAL HIGH (ref 11.6–15.2)

## 2012-09-19 LAB — URINE MICROSCOPIC-ADD ON

## 2012-09-19 LAB — CG4 I-STAT (LACTIC ACID): Lactic Acid, Venous: 2.25 mmol/L — ABNORMAL HIGH (ref 0.5–2.2)

## 2012-09-19 LAB — TYPE AND SCREEN

## 2012-09-19 LAB — APTT: aPTT: 34 seconds (ref 24–37)

## 2012-09-19 MED ORDER — DOPAMINE-DEXTROSE 3.2-5 MG/ML-% IV SOLN: 10.0000 ug/kg/min | Freq: Once | INTRAVENOUS | Status: DC

## 2012-09-19 MED ORDER — ALBUTEROL SULFATE (5 MG/ML) 0.5% IN NEBU
5.0000 mg | INHALATION_SOLUTION | Freq: Once | RESPIRATORY_TRACT | Status: AC
  Administered 2012-09-19: 5 mg via RESPIRATORY_TRACT
  Filled 2012-09-19: qty 1

## 2012-09-19 MED ORDER — IPRATROPIUM BROMIDE 0.02 % IN SOLN
0.5000 mg | Freq: Once | RESPIRATORY_TRACT | Status: AC
  Administered 2012-09-19: 0.5 mg via RESPIRATORY_TRACT
  Filled 2012-09-19: qty 2.5

## 2012-09-19 MED ORDER — SODIUM CHLORIDE 0.9 % IV BOLUS (SEPSIS)
1000.0000 mL | INTRAVENOUS | Status: AC | PRN
  Administered 2012-09-19 (×2): 1000 mL via INTRAVENOUS

## 2012-09-19 MED ORDER — DOPAMINE-DEXTROSE 3.2-5 MG/ML-% IV SOLN
INTRAVENOUS | Status: AC
  Filled 2012-09-19: qty 250

## 2012-09-19 MED ORDER — DEXTROSE 5 % IV SOLN
1.0000 g | INTRAVENOUS | Status: AC
  Administered 2012-09-19: 1 g via INTRAVENOUS
  Filled 2012-09-19: qty 1

## 2012-09-19 MED ORDER — ASPIRIN 81 MG PO CHEW
324.0000 mg | CHEWABLE_TABLET | ORAL | Status: AC
Start: 2012-09-19 — End: 2012-09-19
  Administered 2012-09-19: 324 mg via ORAL
  Filled 2012-09-19: qty 4

## 2012-09-19 MED ORDER — ACETAMINOPHEN 650 MG RE SUPP
650.0000 mg | Freq: Once | RECTAL | Status: AC
  Administered 2012-09-19: 650 mg via RECTAL
  Filled 2012-09-19: qty 1

## 2012-09-19 MED ORDER — DEXTROSE 5 % IV SOLN
1.0000 g | INTRAVENOUS | Status: DC
  Filled 2012-09-19: qty 1

## 2012-09-19 MED ORDER — SODIUM CHLORIDE 0.9 % IV BOLUS (SEPSIS): 1000.0000 mL | Freq: Once | INTRAVENOUS | Status: DC

## 2012-09-19 MED ORDER — SODIUM CHLORIDE 0.9 % IV SOLN: 250.0000 mL | INTRAVENOUS | Status: DC | PRN

## 2012-09-19 MED ORDER — ASPIRIN 300 MG RE SUPP: 300.0000 mg | RECTAL | Status: AC

## 2012-09-19 MED ORDER — HEPARIN (PORCINE) IN NACL 100-0.45 UNIT/ML-% IJ SOLN
650.0000 [IU]/h | INTRAMUSCULAR | Status: DC
  Administered 2012-09-19: 650 [IU]/h via INTRAVENOUS
  Filled 2012-09-19: qty 250

## 2012-09-19 MED ORDER — NOREPINEPHRINE BITARTRATE 1 MG/ML IJ SOLN
2.0000 ug/min | Freq: Once | INTRAVENOUS | Status: AC
  Administered 2012-09-19: 8 ug/min via INTRAVENOUS
  Filled 2012-09-19: qty 4

## 2012-09-19 MED ORDER — VANCOMYCIN HCL IN DEXTROSE 1-5 GM/200ML-% IV SOLN
1000.0000 mg | INTRAVENOUS | Status: AC
  Administered 2012-09-19: 1000 mg via INTRAVENOUS
  Filled 2012-09-19: qty 200

## 2012-09-19 MED ORDER — NOREPINEPHRINE BITARTRATE 1 MG/ML IJ SOLN
2.0000 ug/min | INTRAMUSCULAR | Status: DC
  Administered 2012-09-19: 4 ug/min via INTRAVENOUS
  Filled 2012-09-19: qty 16

## 2012-09-19 MED ORDER — VANCOMYCIN HCL 500 MG IV SOLR
500.0000 mg | Freq: Two times a day (BID) | INTRAVENOUS | Status: DC
  Filled 2012-09-19 (×2): qty 500

## 2012-09-19 NOTE — H&P (Signed)
Name: David [name] MRN: [id number] DOB: Nov 24, 1940    LOS: 1  Referring Provider:  Dr. Effie Shy, ED Reason for Referral:  Septic shock  PULMONARY / CRITICAL CARE MEDICINE  HPI:  David [name] is a 72 y/o man with past medical history significant for CHF (EF 20% as of 12/2011), atrial flutter on coumadin and COPD who was transported to [facility] ED on the evening of 8/30 from his nursing home for diarrhea and altered mental status.  No further history could be provided by the accompanying nurse and no family was present with the patient.  In the ED he was found to be febrile to 102 and hypotensive.  He was given 3.5L of normal saline but continued to be hypotensive.  PCCM was then called for admission for septic shock.  David [name] was started on levophed and given vancomycin and cefepime in the ED prior to admission.  His mental status improved some during my exam and he did report that he has been coughing for the last few days and has been more short of breath.  He has had pneumonia in the past according to his chart but I do not know the date.   Past Medical History  Diagnosis Date  . Community acquired pneumonia     Right middle lobe community-acquired pneumonia  . Toxic metabolic encephalopathy   . Chronic atrial flutter   . Tobacco abuse   . History of coronary artery disease 2008    Cath at [facility]: RCA 95%, RI 60%  - medical therapy  . Ischemic cardiomyopathy   . History of prostate cancer   . Chronic obstructive pulmonary disease     with mild bronchospasm, improved  . Hypertension   . History of alcohol abuse     History of prior heavy alcohol abuse  . History of depression   . Altered mental status 2012   . Noncompliance   . Depression   . Ulcer   . Hx of migraines   . Hyperlipidemia   . Colon cancer    Past Surgical History  Procedure Laterality Date  . Appendectomy     Prior to Admission medications   Medication Sig Start Date End Date Taking? Authorizing  Provider  albuterol (PROVENTIL HFA;VENTOLIN HFA) 108 (90 BASE) MCG/ACT inhaler Inhale 2 puffs into the lungs every 6 (six) hours as needed.   Yes Historical Provider, MD  DULoxetine (CYMBALTA) 60 MG capsule Take 60 mg by mouth daily.   Yes Historical Provider, MD  folic acid (FOLVITE) 1 MG tablet Take 1 tablet (1 mg total) by mouth daily. 12/31/11  Yes Marianne L York, PA-C  guaiFENesin (MUCINEX) 600 MG 12 hr tablet Take 1 tablet (600 mg total) by mouth 2 (two) times daily. 12/31/11  Yes Tora Kindred York, PA-C  HYDROcodone-acetaminophen (NORCO/VICODIN) 5-325 MG per tablet Take 1 tablet by mouth every 6 (six) hours as needed for pain.   Yes Historical Provider, MD  irbesartan (AVAPRO) 75 MG tablet Take 1 tablet (75 mg total) by mouth daily. 12/31/11  Yes Marianne L York, PA-C  lactulose (CHRONULAC) 10 GM/15ML solution Take 30 mLs (20 g total) by mouth daily. 12/31/11  Yes Marianne L York, PA-C  loratadine (CLARITIN) 10 MG tablet Take 10 mg by mouth daily.   Yes Historical Provider, MD  meloxicam (MOBIC) 7.5 MG tablet Take 7.5 mg by mouth daily.   Yes Historical Provider, MD  metoprolol tartrate (LOPRESSOR) 25 MG tablet Take 1 tablet (25 mg total) by  mouth 2 (two) times daily. 12/31/11  Yes Marianne L York, PA-C  mirtazapine (REMERON) 15 MG tablet Take 15 mg by mouth at bedtime.   Yes Historical Provider, MD  Multiple Vitamin (MULTIVITAMIN) tablet Take 1 tablet by mouth daily.   Yes Historical Provider, MD  paliperidone (INVEGA) 6 MG 24 hr tablet Take 6 mg by mouth every morning.   Yes Historical Provider, MD  QUEtiapine (SEROQUEL) 50 MG tablet Take 50 mg by mouth 2 (two) times daily.   Yes Historical Provider, MD  QVAR 40 MCG/ACT inhaler Inhale 2 puffs into the lungs 2 (two) times daily.  01/02/12  Yes Historical Provider, MD  rosuvastatin (CRESTOR) 20 MG tablet Take 20 mg by mouth daily.   Yes Historical Provider, MD  Sennosides-Docusate Sodium (SENNA PLUS PO) Take 1 tablet by mouth 2 (two) times  daily as needed (constipation).    Yes Historical Provider, MD  thiamine (VITAMIN B-1) 100 MG tablet Take 100 mg by mouth daily.   Yes Historical Provider, MD  topiramate (TOPAMAX) 25 MG tablet Take 25 mg by mouth at bedtime.    Yes Historical Provider, MD  warfarin (COUMADIN) 5 MG tablet Take 1 tablet (5 mg total) by mouth daily. 12/31/11  Yes Marianne L York, PA-C  warfarin (COUMADIN) 7.5 MG tablet Take 7.5 mg by mouth as directed. Takes 7.5 mg on thurs and sundays then 5 mg the rest of the week   Yes Historical Provider, MD   Allergies Allergies  Allergen Reactions  . Oat Flour [Oat]     Family History Family History  Problem Relation Age of Onset  . Cancer Father     Prostate Cancer  . Heart disease Other     Parent  . Alcohol abuse Other     Parent  . Arthritis Other     parent  . Hypertension Other     Parent   Social History  reports that he has been smoking Cigarettes.  He has a 20 pack-year smoking history. He has never used smokeless tobacco. He reports that  drinks alcohol. He reports that he does not use illicit drugs.  Review Of Systems:  Could not be obtained as the patient is not able to answer questions accurately.  Brief patient description:  72 y/o NH resident with diarrhea, fever, cough and altered mental status.   Events Since Admission: Central line placement  Current Status: guarded  Vital Signs: Temp:  [98.2 F (36.8 C)-102.5 F (39.2 C)] 98.9 F (37.2 C) (08/30 2019) Pulse Rate:  [65-122] 83 (08/31 0000) Resp:  [7-40] 20 (08/31 0000) BP: (72-107)/(33-80) 86/42 mmHg (08/31 0000) SpO2:  [86 %-98 %] 94 % (08/31 0000) Weight:  [64.6 kg (142 lb 6.7 oz)] 64.6 kg (142 lb 6.7 oz) (08/30 2230)  Physical Examination: General:  Laying in bed, tachypneic, somnolent Neuro:  Oriented to person, not to place or time, by the end of exam more alert, answering some questions HEENT:  PERRL, EOMI, OP clear Neck:  Supple, no masses Cardiovascular:  Irregularly  irregular, II/VI systolic murmur Lungs:  Crackles in bilateral bases, more so on left, transmitted upper airway noise throughout Abdomen:  Soft, NTND, +BS, no HSM Musculoskeletal:  No joint deformities, no edema Skin:  Pt with crusted skin and dirt all over body  Principal Problem:   Septic shock(785.52) Active Problems:   Chronic systolic congestive heart failure, NYHA class 3   ASSESSMENT AND PLAN  PULMONARY  Recent Labs Lab 09/19/12 2230  PHART 7.306*  PCO2ART 39.1  PO2ART 68.3*  HCO3 18.9*  O2SAT 93.3   Ventilator Settings:   CXR:  Increasing opacities in bilateral bases   A:  Health Care Associated Pneumonia P:   Currently on vancomycin and cefepime, would continue these for now.  Urine legionella and Streptococcal antigen pending Sputum culture ordered, will send when pt able to produce sputum.  Currently on 2L Thermal - titrate to maintain sats greater than 92%  CARDIOVASCULAR  Recent Labs Lab 09/19/12 1708 09/19/12 1709  TROPONINI <0.30  --   LATICACIDVEN  --  2.25*   ECG:  Atrial flutter Lines: right subclavian 09/19/2012  A: CHF with EF of 20%, atrial flutter on coumadin P:  Obtain CVP from central line to monitor fluid status, judicious use of fluids. Start heparin gtt and hold coumadin for a-flutter Obtain mixed venous gas if CVP>8, if Mixed venous O2 sat less than 65-70 consider adding dobutamine.    RENAL  Recent Labs Lab 09/19/12 1708 09/19/12 1709 09/19/12 2303  NA 130* 134* 136  K 4.3 5.0 4.5  CL 96 102 108  CO2 23  --  20  BUN 33* 31* 23  CREATININE 1.53* 1.70* 0.89  CALCIUM 9.3  --  7.8*   Intake/Output     08/30 0701 - 08/31 0700   I.V. (mL/kg) 80.1 (1.2)   Total Intake(mL/kg) 80.1 (1.2)   Urine (mL/kg/hr) 150   Total Output 150   Net -69.9        Foley:  Placed 8/30  A:  ARF P:   Improving with fluids.  GASTROINTESTINAL  Recent Labs Lab 09/19/12 1708  AST 52*  ALT 32  ALKPHOS 58  BILITOT 1.1  PROT 6.9   ALBUMIN 3.1*    A:  History of diarrhea P:   Will send C. Dif PCR from next stool as pneumonia is likely source but no big infiltrate on CXR  HEMATOLOGIC  Recent Labs Lab 09/18/12 1301 09/19/12 1708 09/19/12 1709  HGB  --  14.1 13.6  HCT  --  38.8* 40.0  PLT  --  128*  --   INR 1.7 1.90*  --   APTT  --  34  --    A:  Anticoagulation P:  Hold coumadin in case in need of procedures. Start heparin gtt  INFECTIOUS  Recent Labs Lab 09/19/12 1708  WBC 11.4*   Cultures: Urine, blood, sputum Antibiotics: Vanc, cefepime Recent history of positive RPR at 1:2, was treated with penicillin.  Keep in mind if altered mental status does not improve.  A:  Health Care Associated Pneumonia P:   Pt with fever to 102 and recent history of cough.  Bilateral lower lobe infiltrates fluffing out on CXR.  Will continue cover with vanc and cefepime.  Given bilateral lower lobe infiltrate could be aspiration. Will keep NPO pending swallow eval.   ENDOCRINE  Recent Labs Lab 09/19/12 2231  GLUCAP 172*   A:  Hyperglycemia   P:   Will follow glucose on daily chemistries, if consistently over 180 will start SSI   NEUROLOGIC  A:  Altered mental status P:   Baseline mental status unknown. Will need to obtain more info from NH staff (nurse who came to ED with him had never taken care of him before) If does not improve will eval further, may need LP   BEST PRACTICE / DISPOSITION Level of Care:  ICU Primary Service:  PCCM Consultants:  none Code Status:  full Diet:  NPO  DVT Px:  Heparin gtt, SCDs GI Px:  Not indicated Skin Integrity:  Good, but very dirty Social / Family:  none  Alyscia Carmon, Charlann Lange., M.D. Pulmonary and Critical Care Medicine [facility] Pager: [phone]  I spent 35 minutes of critical care time in the care of this patient separate from procedures which are documented elsewhere   09/20/2012, 12:57 AM

## 2012-09-19 NOTE — ED Provider Notes (Signed)
David [name] is a 72 y.o. male who lives at an assisted-living facility, where he was noted to be weak today. He was transferred here for evaluation. He can speak and answer limited questions but is a poor historian.  Exam alert, elderly man, who skin feels very warm to touch. Initial vitals indicates hypotension with tachycardia  Respiratory- not in distress Abdomen soft, nontender. No masses Extremities- no peripheral edema  Initial resuscitation order- peripheral IV X 2. A 22 gauge catheter was started in the left arm by nursing.  There was difficulty in starting 2 large bore peripheral lines, so I started a right IJ, after Chloraseptic cleansing, using an 18-gauge Angiocath, the catheter did not draw blood, but flushed well without evidence for subcutaneous placement.     Date: 09/19/12  Rate: 124  Rhythm: atrial flutter  QRS Axis: normal  PR and QT Intervals: QT prolonged  ST/T Wave abnormalities: nonspecific ST changes  PR and QRS Conduction Disutrbances:prolonged QTc  Narrative Interpretation:   Old EKG Reviewed: changes noted since 12 /4/13, the rate is faster and the QTC is longer   Medical screening examination/treatment/procedure(s) were conducted as a shared visit with non-physician practitioner(s) and myself.  I personally evaluated the patient during the encounter   Flint Melter, MD 09/20/12 [phone]

## 2012-09-19 NOTE — Procedures (Signed)
Central Venous Catheter Insertion Procedure Note David [name] [id number] 06-04-1940  Procedure: Insertion of Central Venous Catheter Indications: Assessment of intravascular volume and Drug and/or fluid administration  Procedure Details Consent: Unable to obtain consent because of emergent medical necessity.  Pt is a nursing home resident, no family was present and patient was not able to consent for himself due to altered mental status.  Time Out: Verified patient identification, verified procedure, site/side was marked, verified correct patient position, special equipment/implants available, medications/allergies/relevent history reviewed, required imaging and test results available.  Performed  Maximum sterile technique was used including antiseptics, cap, gloves, gown, hand hygiene, mask and sheet. Skin prep: Chlorhexidine; local anesthetic administered A antimicrobial bonded/coated triple lumen catheter was placed in the right subclavian vein using the Seldinger technique.  Evaluation Blood flow good Complications: No apparent complications Patient did tolerate procedure well. Chest X-ray ordered to verify placement.  CXR: normal.  David [name] K. 09/19/2012, 10:39 PM

## 2012-09-19 NOTE — ED Notes (Signed)
EDP notified of blood pressure and heart rate.

## 2012-09-19 NOTE — ED Notes (Signed)
Pt lives at [facility] - staff called EMS because of Pt altered .LOC .  Staff also reported PT had recent diarrhea.

## 2012-09-19 NOTE — ED Provider Notes (Signed)
CSN: [id number]     Arrival date & time 09/19/12  1608 History   First MD Initiated Contact with Patient 09/19/12 1609     Chief Complaint  Patient presents with  . Altered Mental Status  . Hypotension  . Diarrhea   (Consider location/radiation/quality/duration/timing/severity/associated sxs/prior Treatment) Patient is a 72 y.o. male presenting with altered mental status and diarrhea. The history is provided by the patient, the nursing home and the EMS personnel. No language interpreter was used.  Altered Mental Status Presenting symptoms: behavior changes, confusion and disorientation   Severity:  Severe Most recent episode:  Today Associated symptoms comment:  He present EMS from [facility] with concern for mental status different from baseline, bowel incontinence, generalized weakness. The nurse providing the information at [facility] reports she is a weekend nurse, seeing today for the first time in a week and cannot say how long he has been ill. She reports at baseline he walks without difficulty, is oriented and conversational with staff. The patient states he is having no pain.  Diarrhea   Past Medical History  Diagnosis Date  . Community acquired pneumonia     Right middle lobe community-acquired pneumonia  . Toxic metabolic encephalopathy   . Chronic atrial flutter   . Tobacco abuse   . History of coronary artery disease 2008    Cath at [facility]: RCA 95%, RI 60%  - medical therapy  . Ischemic cardiomyopathy   . History of prostate cancer   . Chronic obstructive pulmonary disease     with mild bronchospasm, improved  . Hypertension   . History of alcohol abuse     History of prior heavy alcohol abuse  . History of depression   . Altered mental status 2012   . Noncompliance   . Depression   . Ulcer   . Hx of migraines   . Hyperlipidemia   . Colon cancer    Past Surgical History  Procedure Laterality Date  . Appendectomy     Family History  Problem  Relation Age of Onset  . Cancer Father     Prostate Cancer  . Heart disease Other     Parent  . Alcohol abuse Other     Parent  . Arthritis Other     parent  . Hypertension Other     Parent   History  Substance Use Topics  . Smoking status: Current Every Day Smoker -- 0.50 packs/day for 40 years    Types: Cigarettes  . Smokeless tobacco: Never Used  . Alcohol Use: Yes    Review of Systems  Unable to perform ROS: Mental status change  Gastrointestinal: Positive for diarrhea.  Psychiatric/Behavioral: Positive for confusion.    Allergies  Oat flour  Home Medications   Current Outpatient Rx  Name  Route  Sig  Dispense  Refill  . albuterol (PROVENTIL HFA;VENTOLIN HFA) 108 (90 BASE) MCG/ACT inhaler   Inhalation   Inhale 2 puffs into the lungs every 6 (six) hours as needed.         . DULoxetine (CYMBALTA) 60 MG capsule   Oral   Take 60 mg by mouth daily.         . folic acid (FOLVITE) 1 MG tablet   Oral   Take 1 tablet (1 mg total) by mouth daily.   30 tablet   1   . guaiFENesin (MUCINEX) 600 MG 12 hr tablet   Oral   Take 1 tablet (600 mg total) by  mouth 2 (two) times daily.         Marland Kitchen HYDROcodone-acetaminophen (NORCO/VICODIN) 5-325 MG per tablet   Oral   Take 1 tablet by mouth every 6 (six) hours as needed for pain.         Marland Kitchen irbesartan (AVAPRO) 75 MG tablet   Oral   Take 1 tablet (75 mg total) by mouth daily.   30 tablet   1   . lactulose (CHRONULAC) 10 GM/15ML solution   Oral   Take 30 mLs (20 g total) by mouth daily.   240 mL   0   . loratadine (CLARITIN) 10 MG tablet   Oral   Take 10 mg by mouth daily.         . meloxicam (MOBIC) 7.5 MG tablet   Oral   Take 7.5 mg by mouth daily.         . metoprolol tartrate (LOPRESSOR) 25 MG tablet   Oral   Take 1 tablet (25 mg total) by mouth 2 (two) times daily.   60 tablet   0   . mirtazapine (REMERON) 15 MG tablet   Oral   Take 15 mg by mouth at bedtime.         . Multiple Vitamin  (MULTIVITAMIN) tablet   Oral   Take 1 tablet by mouth daily.         . paliperidone (INVEGA) 6 MG 24 hr tablet   Oral   Take 6 mg by mouth every morning.         Marland Kitchen QUEtiapine (SEROQUEL) 50 MG tablet   Oral   Take 50 mg by mouth 2 (two) times daily.         Marland Kitchen QVAR 40 MCG/ACT inhaler   Inhalation   Inhale 2 puffs into the lungs 2 (two) times daily.          . rosuvastatin (CRESTOR) 20 MG tablet   Oral   Take 20 mg by mouth daily.         Bernadette Hoit Sodium (SENNA PLUS PO)   Oral   Take 1 tablet by mouth 2 (two) times daily as needed (constipation).          . thiamine (VITAMIN B-1) 100 MG tablet   Oral   Take 100 mg by mouth daily.         Marland Kitchen topiramate (TOPAMAX) 25 MG tablet   Oral   Take 25 mg by mouth at bedtime.          Marland Kitchen warfarin (COUMADIN) 5 MG tablet   Oral   Take 1 tablet (5 mg total) by mouth daily.         Marland Kitchen warfarin (COUMADIN) 7.5 MG tablet   Oral   Take 7.5 mg by mouth as directed. Takes 7.5 mg on thurs and sundays then 5 mg the rest of the week          BP 82/49  Pulse 120  Temp(Src) 98.2 F (36.8 C) (Oral)  Resp 39  SpO2 86% Physical Exam  Constitutional: He appears well-developed and well-nourished. No distress.  HENT:  Head: Normocephalic.  Oral mucosa dry.  Eyes: Conjunctivae are normal.  Neck: Normal range of motion. Neck supple.  Cardiovascular: Regular rhythm.  Tachycardia present.   Pulmonary/Chest: Tachypnea noted. He has wheezes. He has rales. He exhibits no tenderness.  Abdominal: Soft. Bowel sounds are normal. There is no tenderness. There is no rebound and no guarding.  Musculoskeletal: Normal range of motion.  He exhibits no edema.  Neurological: He is alert.  Skin: Skin is warm and dry. No rash noted.  Psychiatric: He has a normal mood and affect.    ED Course  Procedures (including critical care time) Labs Review Labs Reviewed  CULTURE, BLOOD (ROUTINE X 2)  CULTURE, BLOOD (ROUTINE X 2)  URINE  CULTURE  CULTURE, EXPECTORATED SPUTUM-ASSESSMENT  CBC  COMPREHENSIVE METABOLIC PANEL  URINALYSIS, ROUTINE W REFLEX MICROSCOPIC  CORTISOL  TROPONIN I  PROTIME-INR  APTT  FIBRINOGEN  TYPE AND SCREEN   Results for orders placed during the hospital encounter of 09/19/12  CBC      Result Value Range   WBC 11.4 (*) 4.0 - 10.5 K/uL   RBC 4.24  4.22 - 5.81 MIL/uL   Hemoglobin 14.1  13.0 - 17.0 g/dL   HCT 16.1 (*) 09.6 - 04.5 %   MCV 91.5  78.0 - 100.0 fL   MCH 33.3  26.0 - 34.0 pg   MCHC 36.3 (*) 30.0 - 36.0 g/dL   RDW 40.9  81.1 - 91.4 %   Platelets 128 (*) 150 - 400 K/uL  COMPREHENSIVE METABOLIC PANEL      Result Value Range   Sodium 130 (*) 135 - 145 mEq/L   Potassium 4.3  3.5 - 5.1 mEq/L   Chloride 96  96 - 112 mEq/L   CO2 23  19 - 32 mEq/L   Glucose, Bld 117 (*) 70 - 99 mg/dL   BUN 33 (*) 6 - 23 mg/dL   Creatinine, Ser 7.82 (*) 0.50 - 1.35 mg/dL   Calcium 9.3  8.4 - 95.6 mg/dL   Total Protein 6.9  6.0 - 8.3 g/dL   Albumin 3.1 (*) 3.5 - 5.2 g/dL   AST 52 (*) 0 - 37 U/L   ALT 32  0 - 53 U/L   Alkaline Phosphatase 58  39 - 117 U/L   Total Bilirubin 1.1  0.3 - 1.2 mg/dL   GFR calc non Af Amer 44 (*) >90 mL/min   GFR calc Af Amer 51 (*) >90 mL/min  URINALYSIS, ROUTINE W REFLEX MICROSCOPIC      Result Value Range   Color, Urine ORANGE (*) YELLOW   APPearance TURBID (*) CLEAR   Specific Gravity, Urine 1.022  1.005 - 1.030   pH 5.0  5.0 - 8.0   Glucose, UA NEGATIVE  NEGATIVE mg/dL   Hgb urine dipstick LARGE (*) NEGATIVE   Bilirubin Urine SMALL (*) NEGATIVE   Ketones, ur 15 (*) NEGATIVE mg/dL   Protein, ur 30 (*) NEGATIVE mg/dL   Urobilinogen, UA 2.0 (*) 0.0 - 1.0 mg/dL   Nitrite NEGATIVE  NEGATIVE   Leukocytes, UA TRACE (*) NEGATIVE  TROPONIN I      Result Value Range   Troponin I <0.30  <0.30 ng/mL  PROTIME-INR      Result Value Range   Prothrombin Time 21.2 (*) 11.6 - 15.2 seconds   INR 1.90 (*) 0.00 - 1.49  APTT      Result Value Range   aPTT 34  24 - 37  seconds  FIBRINOGEN      Result Value Range   Fibrinogen 784 (*) 204 - 475 mg/dL  URINE MICROSCOPIC-ADD ON      Result Value Range   Squamous Epithelial / LPF RARE  RARE   WBC, UA 0-2  <3 WBC/hpf   RBC / HPF 0-2  <3 RBC/hpf   Casts GRANULAR CAST (*) NEGATIVE   Urine-Other AMORPHOUS URATES/PHOSPHATES  TYPE AND SCREEN      Result Value Range   ABO/RH(D) B POS     Antibody Screen NEG     Sample Expiration 09/22/2012    ABO/RH      Result Value Range   ABO/RH(D) B POS     CRITICAL CARE Performed by: Elpidio Anis A   Total critical care time: 45  Critical care time was exclusive of separately billable procedures and treating other patients.  Critical care was necessary to treat or prevent imminent or life-threatening deterioration.  Critical care was time spent personally by me on the following activities: development of treatment plan with patient and/or surrogate as well as nursing, discussions with consultants, evaluation of patient's response to treatment, examination of patient, obtaining history from patient or surrogate, ordering and performing treatments and interventions, ordering and review of laboratory studies, ordering and review of radiographic studies, pulse oximetry and re-evaluation of patient's condition.  Imaging Review No results found.  MDM  No diagnosis found. 1. Sepsis 2. Febrile illness 3. Cough    16:15:  Patient presents with signs of sepsis (tachycardia, tachypnea, AMS, hypotension, hypoxia) however, alert but disoriented. 2 IV starts with fluids bolus ordered, sepsis lab/imaging panel.    17:30:  EJ IV started by Dr. Effie Shy after multiple failed peripheral attempts. Fluids being bolused. Blood pressure 105 systolic, improved. Heart rate decreased to 114. Patient remains alert and awake. He is found to have rectal temperature of 102.5, Tylenol given rectally. Continue to monitor. Patient stable at present.   18:45:  Lab studies, CXR reviewed and  are indeterminant for source of infection. UA no evidence infection. Favor pulmonary source as he remains tachypnic with wet breath sounds, hypoxic off O2 to 86-88%, tachycardic. Antibiotics are infusing. Blood pressure decreased to 60/44, patient awake, alert, responsive. Dr. Effie Shy made aware.   19:20:  Blood pressure at 75/45. Dopamine drip started. He remains awake, alert. Continues to be disoriented. PCCM paged for consultation/admission.  7:30:  Discussed with Dr. Darrol Angel, PCCM, who advises norepinephrine rather than dopamine - order changed prior to start of dopamine. He will see patient in ED.  Arnoldo Hooker, PA-C 09/19/12 1934  Arnoldo Hooker, PA-C 09/19/12 1935

## 2012-09-19 NOTE — Progress Notes (Signed)
Attempted ABG twice and was unsuccessful.  Called receiving RT since patient already has a bed and made them aware of pending ABG.

## 2012-09-19 NOTE — ED Notes (Signed)
Dr. Wentz at bedside. 

## 2012-09-19 NOTE — Progress Notes (Signed)
ANTIBIOTIC CONSULT NOTE - INITIAL  Pharmacy Consult for Vancomycin and cefepime Indication: Code sepsis  Allergies  Allergen Reactions  . Oat Flour [Oat]     Patient Measurements:   Adjusted Body Weight:  Vital Signs: Temp: 98.2 F (36.8 C) (08/30 1611) Temp src: Oral (08/30 1611) BP: 82/49 mmHg (08/30 1611) Pulse Rate: 120 (08/30 1611) Intake/Output from previous day:   Intake/Output from this shift:    Labs: No results found for this basename: WBC, HGB, PLT, LABCREA, CREATININE,  in the last 72 hours The CrCl is unknown because both a height and weight (above a minimum accepted value) are required for this calculation. No results found for this basename: VANCOTROUGH, VANCOPEAK, VANCORANDOM, GENTTROUGH, GENTPEAK, GENTRANDOM, TOBRATROUGH, TOBRAPEAK, TOBRARND, AMIKACINPEAK, AMIKACINTROU, AMIKACIN,  in the last 72 hours   Microbiology: No results found for this or any previous visit (from the past 720 hour(s)).  Medical History: Past Medical History  Diagnosis Date  . Community acquired pneumonia     Right middle lobe community-acquired pneumonia  . Toxic metabolic encephalopathy   . Chronic atrial flutter   . Tobacco abuse   . History of coronary artery disease 2008    Cath at [facility]: RCA 95%, RI 60%  - medical therapy  . Ischemic cardiomyopathy   . History of prostate cancer   . Chronic obstructive pulmonary disease     with mild bronchospasm, improved  . Hypertension   . History of alcohol abuse     History of prior heavy alcohol abuse  . History of depression   . Altered mental status 2012   . Noncompliance   . Depression   . Ulcer   . Hx of migraines   . Hyperlipidemia   . Colon cancer     Assessment: 72 y.o. male presenting with altered mental status, diarrhea and hypotension, code sepsis is activated, and pharmacy is consulted to dose vancomycin and cefepime. Previous weight = 64.4 kg in March, Scr 1.53, elevated from baseline. Est. Crcl ~ 45  ml/min, one dose of vancomycin 1g was given 1800, and one dose of cefepime 1g was given at 1730 in the ED.  Goal of Therapy:  Vancomycin trough level 15-20 mcg/ml  Plan:  - vancomycin 500 mg IV Q 12 hrs, next dose tomorrow at 1000 - cefepime 1g IV Q 24 hrs next dose tomorrow at 1800 - f/u labs and current weight, monitor renal function and check vancomycin trough at steady state.   Bayard Hugger, PharmD, BCPS  Clinical Pharmacist  Pager: [phone]   09/19/2012,5:14 PM

## 2012-09-20 DIAGNOSIS — A419 Sepsis, unspecified organism: Secondary | ICD-10-CM | POA: Diagnosis present

## 2012-09-20 DIAGNOSIS — I5022 Chronic systolic (congestive) heart failure: Secondary | ICD-10-CM

## 2012-09-20 DIAGNOSIS — I059 Rheumatic mitral valve disease, unspecified: Secondary | ICD-10-CM

## 2012-09-20 LAB — CBC
HCT: 35.4 % — ABNORMAL LOW (ref 39.0–52.0)
MCH: 31.9 pg (ref 26.0–34.0)
MCHC: 34.7 g/dL (ref 30.0–36.0)
MCV: 91.7 fL (ref 78.0–100.0)
Platelets: 131 10*3/uL — ABNORMAL LOW (ref 150–400)
RDW: 13.9 % (ref 11.5–15.5)

## 2012-09-20 LAB — URINE CULTURE

## 2012-09-20 LAB — BASIC METABOLIC PANEL
BUN: 20 mg/dL (ref 6–23)
Calcium: 7.8 mg/dL — ABNORMAL LOW (ref 8.4–10.5)
Calcium: 8.4 mg/dL (ref 8.4–10.5)
Creatinine, Ser: 0.69 mg/dL (ref 0.50–1.35)
GFR calc Af Amer: >90 mL/min (ref >90)
GFR calc Af Amer: >90 mL/min (ref >90)
GFR calc non Af Amer: 83 mL/min — ABNORMAL LOW (ref >90)
GFR calc non Af Amer: >90 mL/min (ref >90)
Glucose, Bld: 183 mg/dL — ABNORMAL HIGH (ref 70–99)
Sodium: 136 mEq/L (ref 135–145)

## 2012-09-20 LAB — CORTISOL: Cortisol, Plasma: 63.4 ug/dL

## 2012-09-20 LAB — MRSA PCR SCREENING: MRSA by PCR: NEGATIVE

## 2012-09-20 LAB — HEPARIN LEVEL (UNFRACTIONATED): Heparin Unfractionated: 0.11 IU/mL — ABNORMAL LOW (ref 0.30–0.70)

## 2012-09-20 LAB — GLUCOSE, CAPILLARY
Glucose-Capillary: 89 mg/dL (ref 70–99)
Glucose-Capillary: 128 mg/dL — ABNORMAL HIGH (ref 70–99)

## 2012-09-20 LAB — LACTIC ACID, PLASMA
Lactic Acid, Venous: 1.4 mmol/L (ref 0.5–2.2)
Lactic Acid, Venous: 2 mmol/L (ref 0.5–2.2)

## 2012-09-20 LAB — MAGNESIUM: Magnesium: 2 mg/dL (ref 1.5–2.5)

## 2012-09-20 MED ORDER — MAGNESIUM SULFATE 40 MG/ML IJ SOLN
2.0000 g | Freq: Once | INTRAMUSCULAR | Status: AC
  Administered 2012-09-20: 2 g via INTRAVENOUS
  Filled 2012-09-20: qty 50

## 2012-09-20 MED ORDER — FAMOTIDINE IN NACL 20-0.9 MG/50ML-% IV SOLN
20.0000 mg | INTRAVENOUS | Status: DC
  Administered 2012-09-20 – 2012-09-22 (×3): 20 mg via INTRAVENOUS
  Filled 2012-09-20 (×4): qty 50

## 2012-09-20 MED ORDER — DOPAMINE-DEXTROSE 3.2-5 MG/ML-% IV SOLN
INTRAVENOUS | Status: AC
  Administered 2012-09-20: 5 ug/kg/min via INTRAVENOUS
  Filled 2012-09-20: qty 250

## 2012-09-20 MED ORDER — SODIUM CHLORIDE 0.9 % IV BOLUS (SEPSIS)
1000.0000 mL | Freq: Once | INTRAVENOUS | Status: AC
  Administered 2012-09-20: 1000 mL via INTRAVENOUS

## 2012-09-20 MED ORDER — DOPAMINE-DEXTROSE 3.2-5 MG/ML-% IV SOLN
2.0000 ug/kg/min | INTRAVENOUS | Status: DC
  Administered 2012-09-20: 5 ug/kg/min via INTRAVENOUS

## 2012-09-20 MED ORDER — WARFARIN - PHARMACIST DOSING INPATIENT: Freq: Every day | Status: DC

## 2012-09-20 MED ORDER — METOPROLOL TARTRATE 1 MG/ML IV SOLN
2.5000 mg | Freq: Four times a day (QID) | INTRAVENOUS | Status: DC
  Administered 2012-09-21 – 2012-09-22 (×6): 2.5 mg via INTRAVENOUS
  Filled 2012-09-20 (×10): qty 5

## 2012-09-20 MED ORDER — WARFARIN SODIUM 5 MG PO TABS
5.0000 mg | ORAL_TABLET | Freq: Once | ORAL | Status: AC
  Administered 2012-09-20: 5 mg via ORAL
  Filled 2012-09-20: qty 1

## 2012-09-20 MED ORDER — INSULIN ASPART 100 UNIT/ML ~~LOC~~ SOLN
0.0000 [IU] | SUBCUTANEOUS | Status: DC
  Administered 2012-09-20 – 2012-09-21 (×2): 2 [IU] via SUBCUTANEOUS
  Administered 2012-09-22: 3 [IU] via SUBCUTANEOUS
  Administered 2012-09-23: 2 [IU] via SUBCUTANEOUS

## 2012-09-20 MED ORDER — PIPERACILLIN-TAZOBACTAM 3.375 G IVPB
3.3750 g | Freq: Three times a day (TID) | INTRAVENOUS | Status: DC
  Administered 2012-09-20 – 2012-09-21 (×3): 3.375 g via INTRAVENOUS
  Filled 2012-09-20 (×5): qty 50

## 2012-09-20 MED ORDER — PHENYLEPHRINE HCL 10 MG/ML IJ SOLN: 30.0000 ug/min | INTRAVENOUS | Status: DC

## 2012-09-20 MED ORDER — VANCOMYCIN HCL IN DEXTROSE 750-5 MG/150ML-% IV SOLN
750.0000 mg | Freq: Two times a day (BID) | INTRAVENOUS | Status: DC
  Administered 2012-09-20 (×2): 750 mg via INTRAVENOUS
  Filled 2012-09-20 (×4): qty 150

## 2012-09-20 MED ORDER — LEVOFLOXACIN IN D5W 750 MG/150ML IV SOLN
750.0000 mg | INTRAVENOUS | Status: DC
  Administered 2012-09-20 – 2012-09-21 (×2): 750 mg via INTRAVENOUS
  Filled 2012-09-20 (×3): qty 150

## 2012-09-20 NOTE — Progress Notes (Signed)
ANTIBIOTIC/ANTICOAG CONSULT NOTE   Pharmacy Consult for Zosyn and Levaquin, coumadin Indication: Code sepsis, afib  Allergies  Allergen Reactions  . Oat Flour [Oat]     Patient Measurements: Height: 5\' 8"  (172.7 cm) Weight: 142 lb 6.7 oz (64.6 kg) IBW/kg (Calculated) : 68.4   Vital Signs: Temp: 98.8 F (37.1 C) (08/31 0733) Temp src: Oral (08/31 0733) BP: 92/42 mmHg (08/31 0900) Pulse Rate: 99 (08/31 0900) Intake/Output from previous day: 08/30 0701 - 08/31 0700 In: 421.6 [I.V.:421.6] Out: 1570 [Urine:1570] Intake/Output from this shift: Total I/O In: 44.8 [I.V.:44.8] Out: 120 [Urine:120]  Labs:  Recent Labs  09/19/12 1708 09/19/12 1709 09/19/12 2303 09/20/12 0412  WBC 11.4*  --   --  15.4*  HGB 14.1 13.6  --  12.3*  PLT 128*  --   --  131*  CREATININE 1.53* 1.70* 0.89 0.69   Estimated Creatinine Clearance: 76.3 ml/min (by C-G formula based on Cr of 0.69). No results found for this basename: VANCOTROUGH, Leodis Binet, VANCORANDOM, GENTTROUGH, GENTPEAK, GENTRANDOM, TOBRATROUGH, TOBRAPEAK, TOBRARND, AMIKACINPEAK, AMIKACINTROU, AMIKACIN,  in the last 72 hours   Microbiology: Recent Results (from the past 720 hour(s))  MRSA PCR SCREENING     Status: None   Collection Time    09/19/12 10:12 PM      Result Value Range Status   MRSA by PCR NEGATIVE  NEGATIVE Final   Comment:            The GeneXpert MRSA Assay (FDA     approved for NASAL specimens     only), is one component of a     comprehensive MRSA colonization     surveillance program. It is not     intended to diagnose MRSA     infection nor to guide or     monitor treatment for     MRSA infections.    Medical History: Past Medical History  Diagnosis Date  . Community acquired pneumonia     Right middle lobe community-acquired pneumonia  . Toxic metabolic encephalopathy   . Chronic atrial flutter   . Tobacco abuse   . History of coronary artery disease 2008    Cath at [facility]: RCA 95%, RI  60%  - medical therapy  . Ischemic cardiomyopathy   . History of prostate cancer   . Chronic obstructive pulmonary disease     with mild bronchospasm, improved  . Hypertension   . History of alcohol abuse     History of prior heavy alcohol abuse  . History of depression   . Altered mental status 2012   . Noncompliance   . Depression   . Ulcer   . Hx of migraines   . Hyperlipidemia   . Colon cancer     Assessment: 72 y.o. male presenting with altered mental status, diarrhea and hypotension, code sepsis is activated, and pharmacy is consulted to dose vancomycin and cefepime. Cefepime changed to zosyn and levaquin. Coumadin is to resume for h/o afib.  Currently on heparin 650 units/hr>>OK to dc per MD as INR 1.9  Goal of Therapy:  Vancomycin trough level 15-20 mcg/ml  Plan:  - Increase vancomycin 750 mg IV Q 12 hrs - Levaquin 750 mg IV q24 hours -Zosyn 3.375 gm IV q8 hours - f/u cultures, clinical progress and renal function check vancomycin trough at steady state. Coumadin 5 mg po today Check daily PT/INR   Talbert Cage, PharmD Clinical Pharmacist  Pager: [phone]   09/20/2012,9:41 AM

## 2012-09-20 NOTE — Progress Notes (Signed)
eLink Physician-Brief Progress Note Patient Name: David [name] DOB: 1940-08-22 MRN: [id number]  Date of Service  09/20/2012   HPI/Events of Note  Patient with A Flutter with bradycardia responding to DA gtt.  With arrhythmia checked Mag level which was 2.0.   eICU Interventions  Plan: Additional Mag to increase level to 3.0   Intervention Category Intermediate Interventions: Arrhythmia - evaluation and management  Murry Khiev 09/20/2012, 1:47 AM

## 2012-09-20 NOTE — Progress Notes (Signed)
eLink Physician-Brief Progress Note Patient Name: David [name] DOB: May 01, 1940 MRN: [id number]  Date of Service  09/20/2012   HPI/Events of Note   Getting out of bed frequently, redirectable  eICU Interventions  sitter      Max Fickle 09/20/2012, 8:11 PM

## 2012-09-20 NOTE — Progress Notes (Signed)
eLink Physician-Brief Progress Note Patient Name: David [name] DOB: 1940/03/14 MRN: [id number]  Date of Service  09/20/2012   HPI/Events of Note   afib rvr, BP stable  eICU Interventions  Metop 2.5mg  IV q6h      MCQUAID, DOUGLAS 09/20/2012, 8:52 PM

## 2012-09-20 NOTE — Progress Notes (Signed)
eLink Physician-Brief Progress Note Patient Name: Jamicheal Heard DOB: 14-Feb-1940 MRN: [id number]  Date of Service  09/20/2012   HPI/Events of Note  Patient admitted with sepsis suspected PNA - in A Flutter with bradycardia HR dipping intermittently to the the 40s.  Currently on NE gtt at 25 mcg with BP of 108/33 (52).  BMET pending.  eICU Interventions  Plan: Add DA gtt for HR/BP support Check Mag in addition to BMET Continue to monitor   Intervention Category Intermediate Interventions: Arrhythmia - evaluation and management  Donnica Jarnagin 09/20/2012, 12:46 AM

## 2012-09-20 NOTE — Progress Notes (Signed)
  Echocardiogram 2D Echocardiogram has been performed.  David [name] 09/20/2012, 3:06 PM

## 2012-09-20 NOTE — H&P (Signed)
PULMONARY  / CRITICAL CARE MEDICINE  Name: David [name] MRN: [id number] DOB: December 14, 1940    ADMISSION DATE:  09/19/2012 CONSULTATION DATE:  09/19/2012  REFERRING MD :  EDP PRIMARY SERVICE:  PCCM  CHIEF COMPLAINT:  Septic shock  BRIEF PATIENT DESCRIPTION: 72 yo NH bresident with past medical history of CHF (EF 20%), AF (on Coumadin) and COPD brought to ED with diarrhea, cough, fever and acute encephalopathy.  Admitted with septic shock.  SIGNIFICANT EVENTS / STUDIES:   LINES / TUBES: Foley 8/30 >>> [name] CVL 8/30 >>>  CULTURES: 8/30 Blood >>> 8/30 Urine >>>  ANTIBIOTICS: Cefepime 8/30 >>> 8/31 Vancomycin 8/30 >>> Zosyn 8/31 >>> Levaquin 8/31 >>>   INTERVAL HISTORY:  No complains this morning.  Bradycardic overnight - dopamine started.  VITAL SIGNS: Temp:  [97.5 F (36.4 C)-102.5 F (39.2 C)] 98.8 F (37.1 C) (08/31 0733) Pulse Rate:  [52-127] 127 (08/31 0700) Resp:  [7-40] 26 (08/31 0700) BP: (72-134)/(33-83) 103/83 mmHg (08/31 0700) SpO2:  [86 %-100 %] 100 % (08/31 0700) Weight:  [64.6 kg (142 lb 6.7 oz)] 64.6 kg (142 lb 6.7 oz) (08/31 0733)  HEMODYNAMICS: CVP:  [5 mmHg-17 mmHg] 5 mmHg  VENTILATOR SETTINGS:   INTAKE / OUTPUT: Intake/Output     08/30 0701 - 08/31 0700 08/31 0701 - 09/01 0700   I.V. (mL/kg) 367.4 (5.7)    Total Intake(mL/kg) 367.4 (5.7)    Urine (mL/kg/hr) 1570    Total Output 1570     Net -1202.6           PHYSICAL EXAMINATION: General:  Appears acutely ill Neuro:  Awake, alert HEENT:  PERRL, dry membranes Cardiovascular:  RRR, no m/r/g Lungs:  Bilateral diminished air entry, few bibasilar rales Abdomen:  Soft, nontender, bowel sounds diminished Musculoskeletal:  Moves all extremities, no edema Skin:  Intact  LABS:  Recent Labs Lab 09/18/12 1301  09/19/12 1708 09/19/12 1709 09/19/12 2230 09/19/12 2303 09/20/12 0059 09/20/12 0412  HGB  --   --  14.1 13.6  --   --   --  12.3*  WBC  --   --  11.4*  --   --   --   --  15.4*   PLT  --   --  128*  --   --   --   --  131*  NA  --   < > 130* 134*  --  136  --  137  K  --   < > 4.3 5.0  --  4.5  --  3.9  CL  --   < > 96 102  --  108  --  109  CO2  --   --  23  --   --  20  --  21  GLUCOSE  --   < > 117* 116*  --  183*  --  185*  BUN  --   < > 33* 31*  --  23  --  20  CREATININE  --   < > 1.53* 1.70*  --  0.89  --  0.69  CALCIUM  --   --  9.3  --   --  7.8*  --  8.4  MG  --   --   --   --   --   --  2.0  --   AST  --   --  52*  --   --   --   --   --   ALT  --   --  32  --   --   --   --   --   ALKPHOS  --   --  58  --   --   --   --   --   BILITOT  --   --  1.1  --   --   --   --   --   PROT  --   --  6.9  --   --   --   --   --   ALBUMIN  --   --  3.1*  --   --   --   --   --   APTT  --   --  34  --   --   --   --   --   INR 1.7  --  1.90*  --   --   --   --   --   LATICACIDVEN  --   --   --  2.25*  --   --   --   --   TROPONINI  --   --  <0.30  --   --   --   --   --   PHART  --   --   --   --  7.306*  --   --   --   PCO2ART  --   --   --   --  39.1  --   --   --   PO2ART  --   --   --   --  68.3*  --   --   --   < > = values in this interval not displayed.  Recent Labs Lab 09/19/12 2231  GLUCAP 172*   CXR:    ASSESSMENT / PLAN:  PULMONARY A:  HCAP. P:   Gaol SpO2>92, pH>7.30 Supplemental oxygen PRN Antibiotics as below  CARDIOVASCULAR A: Chronic systolic CHF without exacerbation.  A-flutter. P:  Goal MAP>60 Trend lactate Titrate Dopamine down as able TTE Anticoagulation as below  RENAL A:  AKI vs prerenal - resolved. P:   Goal CVP>10 Trend BMP NS 1000 x 1 NS@KVO   GASTROINTESTINAL A:  Diarrhea. P:   NPO Pepcid for GI Px  HEMATOLOGIC A:  Mild anemia - dilutional. Mild thrombocytopenia.  Chronic anticoagulation for AF. P:  Trend CBC DVT Px is not required. Coumadin per pharmacy D/c Heparin gtt when INR is therapeutic  INFECTIOUS A:  HCAP vs aspiration vs Legionella (GI symptoms).  Possible C.dif. Recent h/o positive  RPR s/p PCN treatment. P:   Cultures and antibiotics as above Add Levofloxacin for atypical / Legionella coverage Add Zosyn for anaerobic coverage D/c Cefepime PCT C.dif PCR Legionella / Strep urine Ag  ENDOCRINE  A:  Hyperglycemia. P:   SSI  NEUROLOGIC A:  Metabolic encephalopathy - appears to resolve. P:   No intervention required  DNR / DNI status confirmed.  I have personally obtained a history, examined the patient, evaluated laboratory and imaging results, formulated the assessment and plan and placed orders.  CRITICAL CARE:  The patient is critically ill with multiple organ systems failure and requires high complexity decision making for assessment and support, frequent evaluation and titration of therapies, application of advanced monitoring technologies and extensive interpretation of multiple databases. Critical Care Time devoted to patient care services described in this note is 40 minutes.   Lonia Farber, MD Pulmonary and Critical Care Medicine [facility] Pager: [phone]  09/20/2012, 8:53 AM

## 2012-09-21 ENCOUNTER — Inpatient Hospital Stay (HOSPITAL_COMMUNITY): Payer: PRIVATE HEALTH INSURANCE

## 2012-09-21 DIAGNOSIS — I4892 Unspecified atrial flutter: Secondary | ICD-10-CM

## 2012-09-21 DIAGNOSIS — A419 Sepsis, unspecified organism: Principal | ICD-10-CM

## 2012-09-21 DIAGNOSIS — J441 Chronic obstructive pulmonary disease with (acute) exacerbation: Secondary | ICD-10-CM

## 2012-09-21 DIAGNOSIS — I5022 Chronic systolic (congestive) heart failure: Secondary | ICD-10-CM

## 2012-09-21 DIAGNOSIS — I509 Heart failure, unspecified: Secondary | ICD-10-CM

## 2012-09-21 DIAGNOSIS — I2510 Atherosclerotic heart disease of native coronary artery without angina pectoris: Secondary | ICD-10-CM

## 2012-09-21 LAB — CBC
HCT: 34.1 % — ABNORMAL LOW (ref 39.0–52.0)
Hemoglobin: 12.1 g/dL — ABNORMAL LOW (ref 13.0–17.0)
MCH: 32.2 pg (ref 26.0–34.0)
MCHC: 35.5 g/dL (ref 30.0–36.0)
MCV: 90.7 fL (ref 78.0–100.0)

## 2012-09-21 LAB — BASIC METABOLIC PANEL
BUN: 10 mg/dL (ref 6–23)
CO2: 23 mEq/L (ref 19–32)
Calcium: 8.5 mg/dL (ref 8.4–10.5)
GFR calc non Af Amer: >90 mL/min (ref >90)
Glucose, Bld: 102 mg/dL — ABNORMAL HIGH (ref 70–99)

## 2012-09-21 LAB — GLUCOSE, CAPILLARY
Glucose-Capillary: 90 mg/dL (ref 70–99)
Glucose-Capillary: 90 mg/dL (ref 70–99)
Glucose-Capillary: 79 mg/dL (ref 70–99)
Glucose-Capillary: 108 mg/dL — ABNORMAL HIGH (ref 70–99)

## 2012-09-21 LAB — PROCALCITONIN: Procalcitonin: 0.58 ng/mL

## 2012-09-21 MED ORDER — BIOTENE DRY MOUTH MT LIQD
15.0000 mL | Freq: Two times a day (BID) | OROMUCOSAL | Status: DC
  Administered 2012-09-21 – 2012-09-22 (×3): 15 mL via OROMUCOSAL

## 2012-09-21 MED ORDER — AMIODARONE HCL IN DEXTROSE 360-4.14 MG/200ML-% IV SOLN
60.0000 mg/h | INTRAVENOUS | Status: DC
  Administered 2012-09-21: 60 mg/h via INTRAVENOUS
  Filled 2012-09-21: qty 200

## 2012-09-21 MED ORDER — DIGOXIN 0.25 MG/ML IJ SOLN
0.1250 mg | Freq: Every day | INTRAMUSCULAR | Status: DC
  Administered 2012-09-22: 0.125 mg via INTRAVENOUS
  Filled 2012-09-21 (×2): qty 0.5

## 2012-09-21 MED ORDER — AMIODARONE HCL IN DEXTROSE 360-4.14 MG/200ML-% IV SOLN: 30.0000 mg/h | INTRAVENOUS | Status: DC

## 2012-09-21 MED ORDER — DIGOXIN 0.25 MG/ML IJ SOLN
0.2500 mg | Freq: Once | INTRAMUSCULAR | Status: AC
  Administered 2012-09-21: 0.25 mg via INTRAVENOUS
  Filled 2012-09-21: qty 1

## 2012-09-21 MED ORDER — WARFARIN SODIUM 4 MG PO TABS
4.0000 mg | ORAL_TABLET | Freq: Once | ORAL | Status: AC
  Administered 2012-09-21: 4 mg via ORAL
  Filled 2012-09-21: qty 1

## 2012-09-21 MED ORDER — SODIUM PHOSPHATE 3 MMOLE/ML IV SOLN
20.0000 mmol | Freq: Once | INTRAVENOUS | Status: AC
  Administered 2012-09-21: 20 mmol via INTRAVENOUS
  Filled 2012-09-21: qty 6.67

## 2012-09-21 MED ORDER — SODIUM PHOSPHATE 3 MMOLE/ML IV SOLN: 30.0000 mmol | Freq: Once | INTRAVENOUS | Status: DC

## 2012-09-21 MED ORDER — AMIODARONE LOAD VIA INFUSION
150.0000 mg | Freq: Once | INTRAVENOUS | Status: DC
  Administered 2012-09-21: 150 mg via INTRAVENOUS
  Filled 2012-09-21: qty 83.34

## 2012-09-21 MED ORDER — DIGOXIN 0.25 MG/ML IJ SOLN
0.5000 mg | Freq: Once | INTRAMUSCULAR | Status: AC
  Administered 2012-09-21: 0.5 mg via INTRAVENOUS
  Filled 2012-09-21: qty 2

## 2012-09-21 MED ORDER — BIOTENE DRY MOUTH MT LIQD
15.0000 mL | Freq: Two times a day (BID) | OROMUCOSAL | Status: DC
  Administered 2012-09-21 – 2012-09-24 (×7): 15 mL via OROMUCOSAL

## 2012-09-21 NOTE — Progress Notes (Signed)
UR Completed.  David [name] Jane [phone] 09/21/2012  

## 2012-09-21 NOTE — Progress Notes (Signed)
eLink Physician-Brief Progress Note Patient Name: David [name] DOB: Feb 01, 1940 MRN: [id number]  Date of Service  09/21/2012   HPI/Events of Note   Pt with ongoing PAF RVR  eICU Interventions  Plan to start amiodarone drip   Intervention Category Major Interventions: Arrhythmia - evaluation and management  Shan Levans 09/21/2012, 6:48 PM

## 2012-09-21 NOTE — H&P (Signed)
PULMONARY  / CRITICAL CARE MEDICINE  Name: David [name] MRN: [id number] DOB: 13-Jun-1940    ADMISSION DATE:  09/19/2012 CONSULTATION DATE:  09/19/2012  REFERRING MD :  EDP PRIMARY SERVICE:  PCCM  CHIEF COMPLAINT:  Septic shock  BRIEF PATIENT DESCRIPTION: 72 yo NH bresident with past medical history of CHF (EF 20%), AF (on Coumadin) and COPD brought to ED with diarrhea, cough, fever and acute encephalopathy.  Admitted with septic shock.  SIGNIFICANT EVENTS / STUDIES:   LINES / TUBES: Foley 8/30 >>> R [place] CVL 8/30 >>>  CULTURES: 8/30 Blood >>> 8/30 Urine >>> cdiff ) no stools to send  ANTIBIOTICS: Cefepime 8/30 >>> 8/31 Vancomycin 8/30 >>>9/1 Zosyn 8/31 >>>9/1 Levaquin 8/31 >>>   INTERVAL HISTORY:  No pressors  VITAL SIGNS: Temp:  [98.3 F (36.8 C)-99.5 F (37.5 C)] 99 F (37.2 C) (09/01 0806) Pulse Rate:  [65-148] 143 (09/01 0600) Resp:  [23-45] 25 (09/01 0600) BP: (89-159)/(42-126) 108/55 mmHg (09/01 0600) SpO2:  [93 %-100 %] 96 % (09/01 0600) Weight:  [65 kg (143 lb 4.8 oz)] 65 kg (143 lb 4.8 oz) (09/01 0500)  HEMODYNAMICS:    VENTILATOR SETTINGS:   INTAKE / OUTPUT: Intake/Output     08/31 0701 - 09/01 0700 09/01 0701 - 09/02 0700   I.V. (mL/kg) 347.2 (5.3)    IV Piggyback 1500    Total Intake(mL/kg) 1847.2 (28.4)    Urine (mL/kg/hr) 1870 (1.2)    Total Output 1870     Net -22.8           PHYSICAL EXAMINATION: General: confused Neuro:  Awake, confused, follows commands HEENT:  PERRL Cardiovascular:  RRR, no m/r/g Lungs:  reduced Abdomen:  Soft, nontender, bowel sounds diminished Musculoskeletal:  Moves all extremities, no edema Skin:  Intact  LABS:  Recent Labs Lab 09/18/12 1301  09/19/12 1708  09/19/12 1709 09/19/12 2230 09/19/12 2303 09/20/12 0059 09/20/12 0412 09/20/12 0902 09/20/12 0916 09/20/12 1502 09/20/12 2102 09/21/12 0500  HGB  --   < > 14.1  --  13.6  --   --   --  12.3*  --   --   --   --  12.1*  WBC  --   --  11.4*  --    --   --   --   --  15.4*  --   --   --   --  13.9*  PLT  --   --  128*  --   --   --   --   --  131*  --   --   --   --  156  NA  --   < > 130*  --  134*  --  136  --  137  --   --   --   --  139  K  --   < > 4.3  --  5.0  --  4.5  --  3.9  --   --   --   --  3.8  CL  --   < > 96  --  102  --  108  --  109  --   --   --   --  108  CO2  --   < > 23  --   --   --  20  --  21  --   --   --   --  23  GLUCOSE  --   < > 117*  --  116*  --  183*  --  185*  --   --   --   --  102*  BUN  --   < > 33*  --  31*  --  23  --  20  --   --   --   --  10  CREATININE  --   < > 1.53*  --  1.70*  --  0.89  --  0.69  --   --   --   --  0.63  CALCIUM  --   < > 9.3  --   --   --  7.8*  --  8.4  --   --   --   --  8.5  MG  --   --   --   --   --   --   --  2.0  --   --   --   --   --  2.1  PHOS  --   --   --   --   --   --   --   --   --   --   --   --   --  1.8*  AST  --   --  52*  --   --   --   --   --   --   --   --   --   --   --   ALT  --   --  32  --   --   --   --   --   --   --   --   --   --   --   ALKPHOS  --   --  58  --   --   --   --   --   --   --   --   --   --   --   BILITOT  --   --  1.1  --   --   --   --   --   --   --   --   --   --   --   PROT  --   --  6.9  --   --   --   --   --   --   --   --   --   --   --   ALBUMIN  --   --  3.1*  --   --   --   --   --   --   --   --   --   --   --   APTT  --   --  34  --   --   --   --   --   --   --   --   --   --   --   INR 1.7  --  1.90*  --   --   --   --   --   --   --   --   --   --  2.68*  LATICACIDVEN  --   --   --   < > 2.25*  --   --   --   --  1.0  --  1.4 2.0  --   TROPONINI  --   --  <0.30  --   --   --   --   --   --   --   --   --   --   --  PROCALCITON  --   --   --   --   --   --   --   --   --   --  0.61  --   --  0.58  PHART  --   --   --   --   --  7.306*  --   --   --   --   --   --   --   --   PCO2ART  --   --   --   --   --  39.1  --   --   --   --   --   --   --   --   PO2ART  --   --   --   --   --  68.3*  --   --   --   --    --   --   --   --   < > = values in this interval not displayed.  Recent Labs Lab 09/20/12 1508 09/20/12 1930 09/21/12 0002 09/21/12 0358 09/21/12 0802  GLUCAP 84 128* 133* 90 88   CXR:  No defined infiltrates  ASSESSMENT / PLAN:  PULMONARY A:  HCAP?? P:   Not convinced PNA source Supplemental oxygen PRN Antibiotics as below  CARDIOVASCULAR A: Chronic systolic CHF without exacerbation.  A-flutter, RVR P:  Goal MAP>60 Trend lactate again today TTE awaited Control rate, bursts noted Consider digoxin attempts, as BP low May need amio corrct phis  RENAL A:  AKI vs prerenal improved, hyophosp P:   Trend BMP in am  replace phos May need further bolus  GASTROINTESTINAL A:  Diarrhea resolved P:   NPO Pepcid for GI Px cdiff awaited, may need to dc this with no stool  HEMATOLOGIC A:  Mild anemia - dilutional. Mild thrombocytopenia.  Chronic anticoagulation for AF. P:  Trend CBC Coumadin per pharmacy  INFECTIOUS A:  HCAP vs aspiration vs Legionella (GI symptoms).  Possible C.dif. Recent h/o positive RPR s/p PCN treatment. P:   Levofloxacin Zosyn C.dif PCR pending stool to send Legionella / Strep urine Ag awaited  ENDOCRINE  A:  Hyperglycemia. P:   SSI  NEUROLOGIC A:  Metabolic encephalopathy - appears to resolve. P:   No intervention required  Ccm time 30 min , fib rvr  DNR / DNI  Nelda Bucks., MD Pulmonary and Critical Care Medicine [facility] Pager: [phone]  09/21/2012, 8:37 AM

## 2012-09-21 NOTE — Progress Notes (Signed)
eLink Physician-Brief Progress Note Patient Name: David [name] DOB: 08/27/1940 MRN: [id number]  Date of Service  09/21/2012   HPI/Events of Note  Pt still in afib but HR 80 now  eICU Interventions  Hold on amiodarone for now    Intervention Category Major Interventions: Arrhythmia - evaluation and management  Shan Levans 09/21/2012, 9:16 PM

## 2012-09-21 NOTE — Progress Notes (Signed)
eLink Physician-Brief Progress Note Patient Name: David [name] DOB: 03/15/1940 MRN: [id number]  Date of Service  09/21/2012   HPI/Events of Note  Hypophosphatemia  eICU Interventions  Phos replaced   Intervention Category Intermediate Interventions: Electrolyte abnormality - evaluation and management  DETERDING,ELIZABETH 09/21/2012, 5:42 AM

## 2012-09-21 NOTE — Progress Notes (Signed)
eLink Physician-Brief Progress Note Patient Name: David [name] DOB: 1940/11/04 MRN: [id number]  Date of Service  09/21/2012   HPI/Events of Note   Pt continues to go in and out of PAF  eICU Interventions  Told RN to start amiod as ordered earlier in the shift   Intervention Category Major Interventions: Arrhythmia - evaluation and management  Shan Levans 09/21/2012, 10:33 PM

## 2012-09-21 NOTE — Progress Notes (Signed)
ANTICOAGULATION CONSULT NOTE - Follow Up Consult  Pharmacy Consult for Coumadin Indication: atrial fibrillation  Allergies  Allergen Reactions  . Oat Flour [Oat]     Patient Measurements: Height: 5\' 8"  (172.7 cm) Weight: 143 lb 4.8 oz (65 kg) IBW/kg (Calculated) : 68.4 Heparin Dosing Weight:   Vital Signs: Temp: 99 F (37.2 C) (09/01 0806) Temp src: Oral (09/01 0806) BP: 108/55 mmHg (09/01 0600) Pulse Rate: 143 (09/01 0600)  Labs:  Recent Labs  09/18/12 1301  09/19/12 1708 09/19/12 1709 09/19/12 2303 09/20/12 0412 09/20/12 0700 09/21/12 0500  HGB  --   < > 14.1 13.6  --  12.3*  --  12.1*  HCT  --   < > 38.8* 40.0  --  35.4*  --  34.1*  PLT  --   --  128*  --   --  131*  --  156  APTT  --   --  34  --   --   --   --   --   LABPROT  --   --  21.2*  --   --   --   --  27.6*  INR 1.7  --  1.90*  --   --   --   --  2.68*  HEPARINUNFRC  --   --   --   --   --   --  0.11*  --   CREATININE  --   < > 1.53* 1.70* 0.89 0.69  --  0.63  TROPONINI  --   --  <0.30  --   --   --   --   --   < > = values in this interval not displayed.  Estimated Creatinine Clearance: 76.7 ml/min (by C-G formula based on Cr of 0.63).  Assessment: 72yom continuing on Coumadin for Afib. INR (2.68) is now therapeutic after significantly increasing. With patient on antibiotics and NPO, will give reduced dose tonight. - H/H trending down, Plts stable - No significant bleeding reported - PTA regimen per Coumadin Clinic notes: 5mg  daily except 7.5mg  Thu/Sun  Goal of Therapy:  INR 2-3   Plan:  1. Coumadin 4mg  po x 1 today 2. Follow-up AM INR  Cleon Dew [phone] 09/21/2012,8:29 AM

## 2012-09-21 NOTE — Progress Notes (Signed)
  Amiodarone Drug - Drug Interaction Consult Note  Recommendations: Pt. With A-flutter, RVR, loaded with both digoxin and amiodarone. Amiodarone might increase serum concentration of digoxin. Renal function is ok, est. crcl ~ 70 - 75 ml/min. Patient is also on coumadin dosed per Pharmacy. Amiodarone will inhibit coumadin metabolism.   Plan: - We will monitor PT/INR closely, and take into consideration the drug interaction between amiodarone, levaquin and coumadin. - Recommend to check digoxin level at steady state to avoid toxicity, especially if digoxin need to continued for long term - Monitor heart rate to avoid bradycardia.  Amiodarone is metabolized by the cytochrome P450 system and therefore has the potential to cause many drug interactions. Amiodarone has an average plasma half-life of 50 days (range 20 to 100 days).   There is potential for drug interactions to occur several weeks or months after stopping treatment and the onset of drug interactions may be slow after initiating amiodarone.   []  Statins: Increased risk of myopathy. Simvastatin- restrict dose to 20mg  daily. Other statins: counsel patients to report any muscle pain or weakness immediately.  [x]  Anticoagulants: Amiodarone can increase anticoagulant effect. Consider warfarin dose reduction. Patients should be monitored closely and the dose of anticoagulant altered accordingly, remembering that amiodarone levels take several weeks to stabilize.  []  Antiepileptics: Amiodarone can increase plasma concentration of phenytoin, the dose should be reduced. Note that small changes in phenytoin dose can result in large changes in levels. Monitor patient and counsel on signs of toxicity.  [x]  Beta blockers: increased risk of bradycardia, AV block and myocardial depression. Sotalol - avoid concomitant use.  []   Calcium channel blockers (diltiazem and verapamil): increased risk of bradycardia, AV block and myocardial depression.  []    Cyclosporine: Amiodarone increases levels of cyclosporine. Reduced dose of cyclosporine is recommended.  [x]  Digoxin dose should be halved when amiodarone is started.  []  Diuretics: increased risk of cardiotoxicity if hypokalemia occurs.  []  Oral hypoglycemic agents (glyburide, glipizide, glimepiride): increased risk of hypoglycemia. Patient's glucose levels should be monitored closely when initiating amiodarone therapy.   []  Drugs that prolong the QT interval:  Torsades de pointes risk may be increased with concurrent use - avoid if possible.  Monitor QTc, also keep magnesium/potassium WNL if concurrent therapy can't be avoided. Marland Kitchen Antibiotics: e.g. fluoroquinolones, erythromycin. . Antiarrhythmics: e.g. quinidine, procainamide, disopyramide, sotalol. . Antipsychotics: e.g. phenothiazines, haloperidol.  . Lithium, tricyclic antidepressants, and methadone. Thank You,  Riki Rusk  09/21/2012 7:22 PM

## 2012-09-22 ENCOUNTER — Inpatient Hospital Stay (HOSPITAL_COMMUNITY): Payer: PRIVATE HEALTH INSURANCE

## 2012-09-22 ENCOUNTER — Encounter (HOSPITAL_COMMUNITY): Payer: Self-pay | Admitting: Nurse Practitioner

## 2012-09-22 DIAGNOSIS — E876 Hypokalemia: Secondary | ICD-10-CM | POA: Diagnosis present

## 2012-09-22 DIAGNOSIS — J9601 Acute respiratory failure with hypoxia: Secondary | ICD-10-CM | POA: Diagnosis present

## 2012-09-22 DIAGNOSIS — J9600 Acute respiratory failure, unspecified whether with hypoxia or hypercapnia: Secondary | ICD-10-CM

## 2012-09-22 DIAGNOSIS — J189 Pneumonia, unspecified organism: Secondary | ICD-10-CM

## 2012-09-22 LAB — CBC WITH DIFFERENTIAL/PLATELET
Basophils Absolute: 0 10*3/uL (ref 0.0–0.1)
Basophils Relative: 0 % (ref 0–1)
Eosinophils Absolute: 0 10*3/uL (ref 0.0–0.7)
Eosinophils Relative: 0 % (ref 0–5)
HCT: 32.9 % — ABNORMAL LOW (ref 39.0–52.0)
Hemoglobin: 12 g/dL — ABNORMAL LOW (ref 13.0–17.0)
Lymphocytes Relative: 10 % — ABNORMAL LOW (ref 12–46)
Lymphs Abs: 1.2 10*3/uL (ref 0.7–4.0)
MCH: 32.5 pg (ref 26.0–34.0)
MCHC: 36.5 g/dL — ABNORMAL HIGH (ref 30.0–36.0)
MCV: 89.2 fL (ref 78.0–100.0)
Monocytes Absolute: 1.6 10*3/uL — ABNORMAL HIGH (ref 0.1–1.0)
Monocytes Relative: 13 % — ABNORMAL HIGH (ref 3–12)
Neutro Abs: 9.5 10*3/uL — ABNORMAL HIGH (ref 1.7–7.7)
Neutrophils Relative %: 77 % (ref 43–77)
Platelets: 149 10*3/uL — ABNORMAL LOW (ref 150–400)
RBC: 3.69 MIL/uL — ABNORMAL LOW (ref 4.22–5.81)
RDW: 13.8 % (ref 11.5–15.5)
WBC: 12.4 10*3/uL — ABNORMAL HIGH (ref 4.0–10.5)

## 2012-09-22 LAB — COMPREHENSIVE METABOLIC PANEL
ALT: 44 U/L (ref 0–53)
CO2: 25 mEq/L (ref 19–32)
Calcium: 8.4 mg/dL (ref 8.4–10.5)
GFR calc Af Amer: >90 mL/min (ref >90)
GFR calc non Af Amer: >90 mL/min (ref >90)
Glucose, Bld: 95 mg/dL (ref 70–99)
Sodium: 140 mEq/L (ref 135–145)
Total Bilirubin: 0.6 mg/dL (ref 0.3–1.2)

## 2012-09-22 LAB — GLUCOSE, CAPILLARY
Glucose-Capillary: 96 mg/dL (ref 70–99)
Glucose-Capillary: 173 mg/dL — ABNORMAL HIGH (ref 70–99)

## 2012-09-22 LAB — PROCALCITONIN: Procalcitonin: 0.27 ng/mL

## 2012-09-22 LAB — LEGIONELLA ANTIGEN, URINE

## 2012-09-22 MED ORDER — METOPROLOL TARTRATE 25 MG PO TABS
25.0000 mg | ORAL_TABLET | Freq: Four times a day (QID) | ORAL | Status: DC
  Administered 2012-09-22 – 2012-09-25 (×11): 25 mg via ORAL
  Filled 2012-09-22 (×15): qty 1

## 2012-09-22 MED ORDER — MIRTAZAPINE 15 MG PO TABS
15.0000 mg | ORAL_TABLET | Freq: Every day | ORAL | Status: DC
  Administered 2012-09-22 – 2012-09-24 (×3): 15 mg via ORAL
  Filled 2012-09-22 (×5): qty 1

## 2012-09-22 MED ORDER — METOPROLOL TARTRATE 25 MG PO TABS
25.0000 mg | ORAL_TABLET | Freq: Two times a day (BID) | ORAL | Status: DC
  Administered 2012-09-22: 25 mg via ORAL
  Filled 2012-09-22 (×2): qty 1

## 2012-09-22 MED ORDER — TOPIRAMATE 25 MG PO TABS
25.0000 mg | ORAL_TABLET | Freq: Every day | ORAL | Status: DC
  Administered 2012-09-22 – 2012-09-24 (×3): 25 mg via ORAL
  Filled 2012-09-22 (×4): qty 1

## 2012-09-22 MED ORDER — QUETIAPINE FUMARATE 50 MG PO TABS: 50.0000 mg | ORAL_TABLET | Freq: Two times a day (BID) | ORAL | Status: DC

## 2012-09-22 MED ORDER — AMIODARONE HCL IN DEXTROSE 360-4.14 MG/200ML-% IV SOLN
INTRAVENOUS | Status: AC
  Filled 2012-09-22: qty 200

## 2012-09-22 MED ORDER — PALIPERIDONE ER 6 MG PO TB24
6.0000 mg | ORAL_TABLET | Freq: Every day | ORAL | Status: DC
  Filled 2012-09-22 (×2): qty 1

## 2012-09-22 MED ORDER — METOPROLOL TARTRATE 1 MG/ML IV SOLN: 2.5000 mg | Freq: Once | INTRAVENOUS | Status: DC

## 2012-09-22 MED ORDER — POTASSIUM CHLORIDE CRYS ER 20 MEQ PO TBCR
40.0000 meq | EXTENDED_RELEASE_TABLET | Freq: Once | ORAL | Status: AC
  Administered 2012-09-22: 40 meq via ORAL
  Filled 2012-09-22: qty 2

## 2012-09-22 MED ORDER — LORATADINE 10 MG PO TABS
10.0000 mg | ORAL_TABLET | Freq: Every day | ORAL | Status: DC
Start: 2012-09-22 — End: 2012-09-22
  Filled 2012-09-22: qty 1

## 2012-09-22 MED ORDER — PIPERACILLIN-TAZOBACTAM 3.375 G IVPB
3.3750 g | Freq: Three times a day (TID) | INTRAVENOUS | Status: DC
  Administered 2012-09-22 – 2012-09-23 (×2): 3.375 g via INTRAVENOUS
  Filled 2012-09-22 (×7): qty 50

## 2012-09-22 MED ORDER — DULOXETINE HCL 60 MG PO CPEP
60.0000 mg | ORAL_CAPSULE | Freq: Every day | ORAL | Status: DC
  Administered 2012-09-22 – 2012-09-25 (×4): 60 mg via ORAL
  Filled 2012-09-22 (×4): qty 1

## 2012-09-22 MED ORDER — METOPROLOL TARTRATE 1 MG/ML IV SOLN
INTRAVENOUS | Status: AC
  Administered 2012-09-22: 2.5 mg
  Filled 2012-09-22: qty 5

## 2012-09-22 NOTE — Progress Notes (Signed)
[facility] Progress Note [place] TEAM 1 - Stepdown ICU Team   David [name] ZOX:[id number] DOB: [birth year] DOA: 09/19/2012 PCP: Mikki Harbor  Brief narrative: 72 y/o man with past medical history significant for CHF (EF 20% as of 12/2011), atrial flutter on coumadin and COPD who was transported to [facility] ED on the evening of 8/30 from his nursing home for diarrhea and altered mental status. No further history could be provided by the accompanying nurse and no family was present with the patient. In the ED he was found to be febrile to 102 and hypotensive. He was given 3.5L of normal saline but continued to be hypotensive. PCCM was then called for admission for septic shock. David [name] was started on levophed and given vancomycin and cefepime in the ED prior to admission. His mental status improved some during my exam and he did report that he has been coughing for the last few days and has been more short of breath. He has had pneumonia in the past according to his chart.    Assessment/Plan: Active Problems:   Septic shock(785.52) -cultures have been negative but since admit pt has developed a cough and CXR c/w PNA -did require pressors initially   Acute respiratory failure with hypoxia due to Healthcare-associated pneumonia -stable on cannula oxygen -pulmonary toilet/mobilize -cont broad spectrum anbx's-Levaquin dc'd 2/2 prolonged Qtc- resume Zosyn    Dehydration -resolving    Metabolic encephalopathy/Dementia -seems to have baseline dementia and resides at SNF -home meds held at admit due to prolonged Qtc-follow 12 lead EKG      Atrial flutter with RVR -intermittent PAF but HR more stable during rounds- on BB and digoxin-off amiodarone IV-keep K+ and Mg normal ** this afternoon ~ 245 pm went back into PAF/RVR so Cards called back to assist- given a dose if 2/5 mg of IV Lopressor with improvement- cardiology increasing 25 mg of Lopressor to Q 6hr     Ischemic  cardiomyopathy/Chronic systolic congestive heart failure, NYHA class 3 -repeat ECHO this admit EF up to 40-45% -cont med rxn -compensated    COPD, mild -compensated but monitor in setting of PNA    Hypokalemia -replete and follow    HTN (hypertension) -soft-needs BB for rate control    Chronic Thrombocytopenia -platelets stable and > 100,000    Warfarin anticoagulation -INR subtherapeutic at admit now mildly supratherapeutic- pharmacy managing   DVT prophylaxis: SCDs Code Status: DO NOT RESUSCITATE Family Communication: Patient's no family at bedside Disposition Plan/Expected LOS: Remain in step down Nutritional Status: Chronic protein calorie malnutrition related to ongoing issues with dementia and recurrent infectious processes  Consultants: PCCM-signed off  Procedures: 2-D echocardiogram - Left ventricle: The cavity size was mildly dilated. Wall thickness was normal. Systolic function was mildly to moderately reduced. The estimated ejection fraction was in the range of 40% to 45%. Diffuse hypokinesis. - Mitral valve: Mild regurgitation. - Left atrium: The atrium was mildly dilated. - Right atrium: The atrium was mildly dilated.  Antibiotics: Cefepime 8/30 >>> 8/31  Vancomycin 8/30 >>>9/1  Zosyn 8/31 >>>9/1-9/2 >>>  Levaquin 8/31 >>> 9/2   HPI/Subjective: Patient alert and clearly confused. Unable to answer orientation questions appropriately he keeps stating he is homeless. Wet sounding cough noted during exam  Objective: Blood pressure 100/57, pulse 89, temperature 98.1 F (36.7 C), temperature source Oral, resp. rate 23, height 6' (1.829 m), weight 63.9 kg (140 lb 14 oz), SpO2 95.00%.  Intake/Output Summary (Last 24 hours) at 09/22/12 1823 Last  data filed at 09/22/12 1700  Gross per 24 hour  Intake 2489.94 ml  Output    200 ml  Net 2289.94 ml     Exam: General: No acute respiratory distress Lungs: Coarse to auscultation bilaterally without wheezes  or crackles, nasal cannula oxygen-wet sounding cough Cardiovascular: Irregular rate and rhythm without murmur gallop or rub normal S1 and S2, no peripheral edema or JVD Abdomen: Nontender, nondistended, soft, bowel sounds positive, no rebound, no ascites, no appreciable mass Musculoskeletal: No significant cyanosis, clubbing of bilateral lower extremities Neurological: Alert and oriented x 3, moves all extremities x 4 without focal neurological deficits, CN 2-12 intact  Scheduled Meds:  Scheduled Meds: . antiseptic oral rinse  15 mL Mouth Rinse q12n4p  . antiseptic oral rinse  15 mL Mouth Rinse BID  . digoxin  0.125 mg Intravenous Daily  . DULoxetine  60 mg Oral Daily  . famotidine (PEPCID) IV  20 mg Intravenous Q24H  . insulin aspart  0-15 Units Subcutaneous Q4H  . metoprolol  2.5 mg Intravenous Once  . metoprolol tartrate  25 mg Oral Q6H  . mirtazapine  15 mg Oral QHS  . piperacillin-tazobactam (ZOSYN)  IV  3.375 g Intravenous Q8H  . topiramate  25 mg Oral QHS  . Warfarin - Pharmacist Dosing Inpatient   Does not apply q1800   Continuous Infusions:   **Reviewed in detail by the Attending Physicia  Data Reviewed: Basic Metabolic Panel:  Recent Labs Lab 09/19/12 1708 09/19/12 1709 09/19/12 2303 09/20/12 0059 09/20/12 0412 09/21/12 0500 09/22/12 0500  NA 130* 134* 136  --  137 139 140  K 4.3 5.0 4.5  --  3.9 3.8 3.4*  CL 96 102 108  --  109 108 107  CO2 23  --  20  --  21 23 25   GLUCOSE 117* 116* 183*  --  185* 102* 95  BUN 33* 31* 23  --  20 10 7   CREATININE 1.53* 1.70* 0.89  --  0.69 0.63 0.59  CALCIUM 9.3  --  7.8*  --  8.4 8.5 8.4  MG  --   --   --  2.0  --  2.1  --   PHOS  --   --   --   --   --  1.8* 2.7   Liver Function Tests:  Recent Labs Lab 09/19/12 1708 09/22/12 0500  AST 52* 49*  ALT 32 44  ALKPHOS 58 63  BILITOT 1.1 0.6  PROT 6.9 5.7*  ALBUMIN 3.1* 2.2*   No results found for this basename: LIPASE, AMYLASE,  in the last 168 hours No results  found for this basename: AMMONIA,  in the last 168 hours CBC:  Recent Labs Lab 09/19/12 1708 09/19/12 1709 09/20/12 0412 09/21/12 0500 09/22/12 0500  WBC 11.4*  --  15.4* 13.9* 12.4*  NEUTROABS  --   --   --   --  9.5*  HGB 14.1 13.6 12.3* 12.1* 12.0*  HCT 38.8* 40.0 35.4* 34.1* 32.9*  MCV 91.5  --  91.7 90.7 89.2  PLT 128*  --  131* 156 149*   Cardiac Enzymes:  Recent Labs Lab 09/19/12 1708  TROPONINI <0.30   BNP (last 3 results) No results found for this basename: PROBNP,  in the last 8760 hours CBG:  Recent Labs Lab 09/21/12 1600 09/21/12 1943 09/22/12 0004 09/22/12 0419 09/22/12 1557  GLUCAP 79 108* 96 92 173*    Recent Results (from the past 240 hour(s))  CULTURE,  BLOOD (ROUTINE X 2)     Status: None   Collection Time    09/19/12  4:45 PM      Result Value Range Status   Specimen Description BLOOD LEFT HAND   Final   Special Requests BOTTLES DRAWN AEROBIC AND ANAEROBIC R 5CC B 10CC   Final   Culture  Setup Time     Final   Value: 09/19/2012 19:31     Performed at [facility]   Culture     Final   Value:        BLOOD CULTURE RECEIVED NO GROWTH TO DATE CULTURE WILL BE HELD FOR 5 DAYS BEFORE ISSUING A FINAL NEGATIVE REPORT     Performed at [facility]   Report Status PENDING   Incomplete  CULTURE, BLOOD (ROUTINE X 2)     Status: None   Collection Time    09/19/12  5:00 PM      Result Value Range Status   Specimen Description BLOOD RIGHT HAND   Final   Special Requests BOTTLES DRAWN AEROBIC ONLY 10CC   Final   Culture  Setup Time     Final   Value: 09/19/2012 19:31     Performed at [facility]   Culture     Final   Value:        BLOOD CULTURE RECEIVED NO GROWTH TO DATE CULTURE WILL BE HELD FOR 5 DAYS BEFORE ISSUING A FINAL NEGATIVE REPORT     Performed at [facility]   Report Status PENDING   Incomplete  URINE CULTURE     Status: None   Collection Time    09/19/12  5:33 PM      Result Value Range Status    Specimen Description URINE, CATHETERIZED   Final   Special Requests NONE   Final   Culture  Setup Time     Final   Value: 09/19/2012 22:49     Performed at [facility] Count     Final   Value: NO GROWTH     Performed at [facility]   Culture     Final   Value: NO GROWTH     Performed at [facility]   Report Status 09/20/2012 FINAL   Final  MRSA PCR SCREENING     Status: None   Collection Time    09/19/12 10:12 PM      Result Value Range Status   MRSA by PCR NEGATIVE  NEGATIVE Final   Comment:            The GeneXpert MRSA Assay (FDA     approved for NASAL specimens     only), is one component of a     comprehensive MRSA colonization     surveillance program. It is not     intended to diagnose MRSA     infection nor to guide or     monitor treatment for     MRSA infections.     Studies:  Recent x-ray studies have been reviewed in detail by the Attending Physician    Junious Silk, ANP [facility] Office  [phone] Pager [phone]  **If unable to reach the above provider after paging please contact the Flow Manager @ [phone]  On-Call/Text Page:      [email or web address]      password [email or web address]  If 7PM-7AM, please contact night-coverage [email or web address] Password [facility] 09/22/2012, 6:23 PM   LOS: 3 days  I have examined the patient, reviewed the chart and modified the above note which I agree with.   Daphanie Oquendo,MD [phone] 09/22/2012, 6:24 PM

## 2012-09-22 NOTE — Progress Notes (Deleted)
[facility] Progress Note David [name] AVW:[id number] DOB: [birth year] DOA: 09/19/2012 PCP: Mikki Harbor  Brief narrative: 72 y/o man with past medical history significant for CHF (EF 20% as of 12/2011), atrial flutter on coumadin and COPD who was transported to [facility] ED on the evening of 8/30 from his nursing home for diarrhea and altered mental status. No further history could be provided by the accompanying nurse and no family was present with the patient. In the ED he was found to be febrile to 102 and hypotensive. He was given 3.5L of normal saline but continued to be hypotensive. PCCM was then called for admission for septic shock. David [name] was started on levophed and given vancomycin and cefepime in the ED prior to admission. His mental status improved some during my exam and he did report that he has been coughing for the last few days and has been more short of breath. He has had pneumonia in the past according to his chart.    Assessment/Plan: Active Problems:   Septic shock(785.52) -cultures have been negative but since admit pt has developed a cough and CXR c/w PNA -did require pressors initially   Acute respiratory failure with hypoxia due to Healthcare-associated pneumonia -stable on cannula oxygen -pulmonary toilet/mobilize -cont broad spectrum anbx's-Levaquin dc'd 2/2 prolonged Qtc- resume Zosyn    Dehydration -resolving    Metabolic encephalopathy/Dementia -seems to have baseline dementia and resides at SNF -home meds held at admit due to prolonged Qtc-follow 12 lead EKG      Atrial flutter with RVR -intermittent PAF but HR more stable during rounds- on BB and digoxin-off amiodarone IV-keep K+ and Mg normal ** this afternoon ~ 245 pm went back into PAF/RVR so Cards called back to assist    Ischemic cardiomyopathy/Chronic systolic congestive heart failure, NYHA class 3 -repeat ECHO this admit EF up to  40-45% -cont med rxn -compensated    COPD, mild -compensated but monitor in setting of PNA    Hypokalemia -replete and follow    HTN (hypertension) -soft-needs BB for rate control    Chronic Thrombocytopenia -platelets stable and > 100,000    Warfarin anticoagulation -INR subtherapeutic at admit now mildly supratherapeutic- pharmacy managing   DVT prophylaxis: SCDs Code Status: DO NOT RESUSCITATE Family Communication: Patient's no family at bedside Disposition Plan/Expected LOS: Remain in step down Nutritional Status: Chronic protein calorie malnutrition related to ongoing issues with dementia and recurrent infectious processes  Consultants: PCCM-signed off  Procedures: 2-D echocardiogram - Left ventricle: The cavity size was mildly dilated. Wall thickness was normal. Systolic function was mildly to moderately reduced. The estimated ejection fraction was in the range of 40% to 45%. Diffuse hypokinesis. - Mitral valve: Mild regurgitation. - Left atrium: The atrium was mildly dilated. - Right atrium: The atrium was mildly dilated.  Antibiotics: Cefepime 8/30 >>> 8/31  Vancomycin 8/30 >>>9/1  Zosyn 8/31 >>>9/1-9/2 >>>  Levaquin 8/31 >>> 9/2   HPI/Subjective: Patient alert and clearly confused. Unable to answer orientation questions appropriately he keeps stating he is homeless. Wet sounding cough noted during exam  Objective: Blood pressure 117/54, pulse 64, temperature 97.9 F (36.6 C), temperature source Oral, resp. rate 23, height 5\' 8"  (1.727 m), weight 65 kg (143 lb 4.8 oz), SpO2 98.00%.  Intake/Output Summary (Last 24 hours) at 09/22/12 1145 Last data filed at 09/22/12 0826  Gross per 24 hour  Intake 3014.44 ml  Output    350  ml  Net 2664.44 ml     Exam: General: No acute respiratory distress Lungs: Coarse to auscultation bilaterally without wheezes or crackles, nasal cannula oxygen-wet sounding cough Cardiovascular: Irregular rate and rhythm  without murmur gallop or rub normal S1 and S2, no peripheral edema or JVD Abdomen: Nontender, nondistended, soft, bowel sounds positive, no rebound, no ascites, no appreciable mass Musculoskeletal: No significant cyanosis, clubbing of bilateral lower extremities Neurological: Alert and oriented x 3, moves all extremities x 4 without focal neurological deficits, CN 2-12 intact  Scheduled Meds:  Scheduled Meds: . amiodarone (NEXTERONE PREMIX) 360 mg/200 mL dextrose      . antiseptic oral rinse  15 mL Mouth Rinse q12n4p  . antiseptic oral rinse  15 mL Mouth Rinse BID  . digoxin  0.125 mg Intravenous Daily  . DULoxetine  60 mg Oral Daily  . famotidine (PEPCID) IV  20 mg Intravenous Q24H  . insulin aspart  0-15 Units Subcutaneous Q4H  . metoprolol tartrate  25 mg Oral BID  . mirtazapine  15 mg Oral QHS  . paliperidone  6 mg Oral QAC breakfast  . topiramate  25 mg Oral QHS  . Warfarin - Pharmacist Dosing Inpatient   Does not apply q1800   Continuous Infusions:   **Reviewed in detail by the Attending Physicia  Data Reviewed: Basic Metabolic Panel:  Recent Labs Lab 09/19/12 1708 09/19/12 1709 09/19/12 2303 09/20/12 0059 09/20/12 0412 09/21/12 0500 09/22/12 0500  NA 130* 134* 136  --  137 139 140  K 4.3 5.0 4.5  --  3.9 3.8 3.4*  CL 96 102 108  --  109 108 107  CO2 23  --  20  --  21 23 25   GLUCOSE 117* 116* 183*  --  185* 102* 95  BUN 33* 31* 23  --  20 10 7   CREATININE 1.53* 1.70* 0.89  --  0.69 0.63 0.59  CALCIUM 9.3  --  7.8*  --  8.4 8.5 8.4  MG  --   --   --  2.0  --  2.1  --   PHOS  --   --   --   --   --  1.8* 2.7   Liver Function Tests:  Recent Labs Lab 09/19/12 1708 09/22/12 0500  AST 52* 49*  ALT 32 44  ALKPHOS 58 63  BILITOT 1.1 0.6  PROT 6.9 5.7*  ALBUMIN 3.1* 2.2*   No results found for this basename: LIPASE, AMYLASE,  in the last 168 hours No results found for this basename: AMMONIA,  in the last 168 hours CBC:  Recent Labs Lab 09/19/12 1708  09/19/12 1709 09/20/12 0412 09/21/12 0500 09/22/12 0500  WBC 11.4*  --  15.4* 13.9* 12.4*  NEUTROABS  --   --   --   --  9.5*  HGB 14.1 13.6 12.3* 12.1* 12.0*  HCT 38.8* 40.0 35.4* 34.1* 32.9*  MCV 91.5  --  91.7 90.7 89.2  PLT 128*  --  131* 156 149*   Cardiac Enzymes:  Recent Labs Lab 09/19/12 1708  TROPONINI <0.30   BNP (last 3 results) No results found for this basename: PROBNP,  in the last 8760 hours CBG:  Recent Labs Lab 09/21/12 1124 09/21/12 1600 09/21/12 1943 09/22/12 0004 09/22/12 0419  GLUCAP 90 79 108* 96 92    Recent Results (from the past 240 hour(s))  CULTURE, BLOOD (ROUTINE X 2)     Status: None   Collection Time  09/19/12  4:45 PM      Result Value Range Status   Specimen Description BLOOD LEFT HAND   Final   Special Requests BOTTLES DRAWN AEROBIC AND ANAEROBIC R 5CC B 10CC   Final   Culture  Setup Time     Final   Value: 09/19/2012 19:31     Performed at [facility]   Culture     Final   Value:        BLOOD CULTURE RECEIVED NO GROWTH TO DATE CULTURE WILL BE HELD FOR 5 DAYS BEFORE ISSUING A FINAL NEGATIVE REPORT     Performed at [facility]   Report Status PENDING   Incomplete  CULTURE, BLOOD (ROUTINE X 2)     Status: None   Collection Time    09/19/12  5:00 PM      Result Value Range Status   Specimen Description BLOOD RIGHT HAND   Final   Special Requests BOTTLES DRAWN AEROBIC ONLY 10CC   Final   Culture  Setup Time     Final   Value: 09/19/2012 19:31     Performed at [facility]   Culture     Final   Value:        BLOOD CULTURE RECEIVED NO GROWTH TO DATE CULTURE WILL BE HELD FOR 5 DAYS BEFORE ISSUING A FINAL NEGATIVE REPORT     Performed at [facility]   Report Status PENDING   Incomplete  URINE CULTURE     Status: None   Collection Time    09/19/12  5:33 PM      Result Value Range Status   Specimen Description URINE, CATHETERIZED   Final   Special Requests NONE   Final   Culture  Setup Time      Final   Value: 09/19/2012 22:49     Performed at [facility] Count     Final   Value: NO GROWTH     Performed at [facility]   Culture     Final   Value: NO GROWTH     Performed at [facility]   Report Status 09/20/2012 FINAL   Final  MRSA PCR SCREENING     Status: None   Collection Time    09/19/12 10:12 PM      Result Value Range Status   MRSA by PCR NEGATIVE  NEGATIVE Final   Comment:            The GeneXpert MRSA Assay (FDA     approved for NASAL specimens     only), is one component of a     comprehensive MRSA colonization     surveillance program. It is not     intended to diagnose MRSA     infection nor to guide or     monitor treatment for     MRSA infections.     Studies:  Recent x-ray studies have been reviewed in detail by the Attending Physician    Junious Silk, ANP [facility] Office  [phone] Pager [phone]  **If unable to reach the above provider after paging please contact the Flow Manager @ [phone]  On-Call/Text Page:      [email or web address]      password [email or web address]  If 7PM-7AM, please contact night-coverage [email or web address] Password [email or web address] 09/22/2012, 11:45 AM   LOS: 3 days

## 2012-09-22 NOTE — Consult Note (Signed)
CARDIOLOGY CONSULT NOTE  Patient ID: David [name] MRN: [id number], DOB/AGE: 03-31-1940   Admit date: 09/19/2012 Date of Consult: 09/22/2012  Primary Physician: Mikki Harbor Primary Cardiologist: P. [place], MD   Pt. Profile  72 y/o male with h/o permanent atrial flutter on oral bb and coumadin as an outpt who was admitted with septic shock and has been having elevated HR's.  Problem List  Past Medical History  Diagnosis Date  . Community acquired pneumonia     Right middle lobe community-acquired pneumonia  . Toxic metabolic encephalopathy   . Chronic atrial flutter     a. rate controlled with bb therapy;  b. h/o bradycardia when on digoxin 12/2011.  . Tobacco abuse   . History of coronary artery disease 2008    a. Cath at [facility]: RCA 95%, RI 60%  - medical therapy  . Ischemic cardiomyopathy     a. prev documented EF of 20-30%;  b. 09/2012 Echo: EF 40-45%, diff HK, mild MR, mild bi-atrial enlargement.  . History of prostate cancer   . Chronic obstructive pulmonary disease     with mild bronchospasm, improved  . Hypertension   . History of alcohol abuse     History of prior heavy alcohol abuse  . History of depression   . Altered mental status 2012   . Noncompliance   . Depression   . Ulcer   . Hx of migraines   . Hyperlipidemia   . Colon cancer     Past Surgical History  Procedure Laterality Date  . Appendectomy      Allergies  Allergies  Allergen Reactions  . Oat Flour [Oat]    HPI   72 y/o male with prior h/o CAD and permanent atrial flutter as outlined above.  He stays at a nsg home however INR's are followed in hour coumadin clinic.  He presented to the [facility] ED on the evening of 8/30 with complaints of diarrhea and altered mental status.  There, he was found to be febrile and hypotensive despite IVF.  He was admitted to [facility] for septic shock and placed on pressors and IV abx.  Lactate was elevated @ 2.25, while pct was also up, at 0.61.  Pressors  were weaned on 8/31, though bp has remained borderline.   In the setting of acute illness his HR's have been variable, between the 60's to 140's.  He has been receiving IV metoprolol and this has been transitioned to oral metoprolol 25mg  bid.  Digoxin has also been added (note pt had bradycardia in setting of carvedilol and digoxin in the past).  Echo yesterday showed EF of 40-45%.  We've been asked to eval.  Pt denies chest pain or dyspnea.  He is coughing.  He says that he came to the hospital b/c he was looking for another place to stay.  Inpatient Medications  . antiseptic oral rinse  15 mL Mouth Rinse q12n4p  . antiseptic oral rinse  15 mL Mouth Rinse BID  . digoxin  0.125 mg Intravenous Daily  . DULoxetine  60 mg Oral Daily  . famotidine (PEPCID) IV  20 mg Intravenous Q24H  . insulin aspart  0-15 Units Subcutaneous Q4H  . metoprolol  2.5 mg Intravenous Once  . metoprolol tartrate  25 mg Oral BID  . mirtazapine  15 mg Oral QHS  . piperacillin-tazobactam (ZOSYN)  IV  3.375 g Intravenous Q8H  . topiramate  25 mg Oral QHS  . Warfarin - Pharmacist Dosing Inpatient  Does not apply q56   Family History - per records.  Pt unable to provide FH. Family History  Problem Relation Age of Onset  . Cancer Father     Prostate Cancer  . Heart disease Other     Parent  . Alcohol abuse Other     Parent  . Arthritis Other     parent  . Hypertension Other     Parent    Social History - per records.  Pt unable to provide [facility]. History   Social History  . Marital Status: Legally Separated    Spouse Name: N/A    Number of Children: N/A  . Years of Education: 10   Occupational History  .     Social History Main Topics  . Smoking status: Current Every Day Smoker -- 0.50 packs/day for 40 years    Types: Cigarettes  . Smokeless tobacco: Never Used  . Alcohol Use: Yes  . Drug Use: No  . Sexual Activity: Not on file   Other Topics Concern  . Not on file   Social History Narrative    Regular exercise-no..   Caffeine Use-yes    Review of Systems - difficult to ascertain given pts encephalopathy.  Says he came to the hospital b/c he was looking for a place to stay.  General:  No chills, fever, night sweats or weight changes.  Cardiovascular:  No chest pain, +++ dyspnea on exertion, edema, orthopnea, palpitations, paroxysmal nocturnal dyspnea. Dermatological: No rash, lesions/masses Respiratory: No cough, +++ dyspnea Urologic: No hematuria, dysuria Abdominal:   No nausea, vomiting, diarrhea, bright red blood per rectum, melena, or hematemesis Neurologic:  No visual changes, wkns, changes in mental status. All other systems reviewed and are otherwise negative except as noted above.  Physical Exam  Blood pressure 119/48, pulse 154, temperature 98 F (36.7 C), temperature source Oral, resp. rate 31, height 5\' 8"  (1.727 m), weight 143 lb 4.8 oz (65 kg), SpO2 95.00%.  General: Pleasant, NAD.   Psych: Flat affect.  Confused.  Says he lives in his own home. Neuro: Moves all extremities spontaneously. HEENT: Normal  Neck: Supple without bruits or JVD. Lungs:  Resp regular and unlabored, rhonchi throughout. Heart: ir, ir, distant, no s3, s4, or murmurs. Abdomen: Soft, non-tender, non-distended, BS + x 4.  Extremities: No clubbing, cyanosis or edema. DP/PT/Radials 2+ and equal bilaterally.  Labs   Recent Labs  09/19/12 1708  TROPONINI <0.30   Lab Results  Component Value Date   WBC 12.4* 09/22/2012   HGB 12.0* 09/22/2012   HCT 32.9* 09/22/2012   MCV 89.2 09/22/2012   PLT 149* 09/22/2012    Recent Labs Lab 09/22/12 0500  NA 140  K 3.4*  CL 107  CO2 25  BUN 7  CREATININE 0.59  CALCIUM 8.4  PROT 5.7*  BILITOT 0.6  ALKPHOS 63  ALT 44  AST 49*  GLUCOSE 95   Radiology/Studies  Dg Chest Port 1 View  09/22/2012   *RADIOLOGY REPORT*  Clinical Data: Cough, assess infiltrates  PORTABLE CHEST - 1 VIEW    IMPRESSION: Mild patchy bilateral lower lobe opacities,  suspicious for pneumonia, less likely atelectasis.   Original Report Authenticated By: Charline Bills, M.D.   2D Echocardiogram 8.31.2014  Study Conclusions  - Left ventricle: The cavity size was mildly dilated. Wall   thickness was normal. Systolic function was mildly to   moderately reduced. The estimated ejection fraction was in   the range of 40% to 45%. Diffuse  hypokinesis. - Mitral valve: Mild regurgitation. - Left atrium: The atrium was mildly dilated. - Right atrium: The atrium was mildly dilated. _____________   ECG  Aflutter, 124, poor r prog, no acute st/t changes.  ASSESSMENT AND PLAN  See below  Signed, Nicolasa Ducking, NP 09/22/2012, 3:15 PM   Patient seen with NP, agree with the above note.  1. Septic shock: Resolving, off pressors.  ? Etiology => perhaps PNA.  Per CCM.  2. Atrial flutter: Chronic/permanent.  Rate better current (HR in 70s).  Continue po digoxin and increase metoprolol to 25 mg every 6 hours.  Continue coumadin.  Transition to Toprol XL when HR stable.  3. Ischemic cardiomyopathy: Stable.  Not short of breath.  No JVD.    Marca Ancona 09/22/2012 3:42 PM

## 2012-09-22 NOTE — Progress Notes (Signed)
ANTICOAGULATION CONSULT NOTE - Follow Up Consult  Pharmacy Consult for Coumadin Indication: atrial fibrillation  Patient Measurements: Height: 5\' 8"  (172.7 cm) Weight: 143 lb 4.8 oz (65 kg) IBW/kg (Calculated) : 68.4 Vital Signs: Temp: 98 F (36.7 C) (09/02 1146) Temp src: Oral (09/02 1146) BP: 106/56 mmHg (09/02 1300) Pulse Rate: 149 (09/02 1300) Labs:  Recent Labs  09/19/12 1708  09/20/12 0412 09/20/12 0700 09/21/12 0500 09/22/12 0500  HGB 14.1  < > 12.3*  --  12.1* 12.0*  HCT 38.8*  < > 35.4*  --  34.1* 32.9*  PLT 128*  --  131*  --  156 149*  APTT 34  --   --   --   --   --   LABPROT 21.2*  --   --   --  27.6* 33.5*  INR 1.90*  --   --   --  2.68* 3.46*  HEPARINUNFRC  --   --   --  0.11*  --   --   CREATININE 1.53*  < > 0.69  --  0.63 0.59  TROPONINI <0.30  --   --   --   --   --   < > = values in this interval not displayed.  Estimated Creatinine Clearance: 76.7 ml/min (by C-G formula based on Cr of 0.59).  Assessment: 61 YOM with history of atrial fibrillation on chronic Coumadin. INR has been trending up sharply while on IV antibiotics, IV amiodarone, and NPO. Patient given lower dose on 9/1 but still with sharp rise in INR today. Note that IV Levaquin was discontinued due to prolonged QTc.   Goal of Therapy:  INR 2-3   Plan:  1. No Coumadin today due to supratherapeutic INR.  2. Follow-up PT/INR in AM.   Link Snuffer, PharmD, BCPS Clinical Pharmacist [phone] 09/22/2012,1:50 PM

## 2012-09-22 NOTE — Progress Notes (Signed)
PT Cancellation Note  Patient Details Name: Kasra Melvin MRN: [id number] DOB: 25-Oct-1940   Cancelled Treatment:    Reason Eval/Treat Not Completed: Medical issues which prohibited therapy (pt HR 150 ). Pt had just walked to bathroom with tech and HR up to 150. Will hold at this time.    Toney Sang Beth 09/22/2012, 12:49 PM Delaney Meigs, PT [phone]

## 2012-09-22 NOTE — Progress Notes (Signed)
1600 patient taken to 2600 via w/c with all belongings. Report called to receiving nurse. Past medical history and hospitalization progression. All questions answered.

## 2012-09-23 ENCOUNTER — Inpatient Hospital Stay (HOSPITAL_COMMUNITY): Payer: PRIVATE HEALTH INSURANCE

## 2012-09-23 LAB — GLUCOSE, CAPILLARY
Glucose-Capillary: 100 mg/dL — ABNORMAL HIGH (ref 70–99)
Glucose-Capillary: 103 mg/dL — ABNORMAL HIGH (ref 70–99)
Glucose-Capillary: 126 mg/dL — ABNORMAL HIGH (ref 70–99)
Glucose-Capillary: 128 mg/dL — ABNORMAL HIGH (ref 70–99)
Glucose-Capillary: 67 mg/dL — ABNORMAL LOW (ref 70–99)

## 2012-09-23 LAB — BASIC METABOLIC PANEL
CO2: 26 mEq/L (ref 19–32)
Calcium: 8.7 mg/dL (ref 8.4–10.5)
GFR calc Af Amer: >90 mL/min (ref >90)
GFR calc non Af Amer: >90 mL/min (ref >90)
Sodium: 140 mEq/L (ref 135–145)

## 2012-09-23 LAB — PROTIME-INR: Prothrombin Time: 29.9 seconds — ABNORMAL HIGH (ref 11.6–15.2)

## 2012-09-23 LAB — CBC
MCH: 31.7 pg (ref 26.0–34.0)
Platelets: 192 10*3/uL (ref 150–400)
RBC: 3.78 MIL/uL — ABNORMAL LOW (ref 4.22–5.81)
RDW: 14.1 % (ref 11.5–15.5)

## 2012-09-23 MED ORDER — GUAIFENESIN-DM 100-10 MG/5ML PO SYRP
5.0000 mL | ORAL_SOLUTION | ORAL | Status: DC | PRN
  Administered 2012-09-23: 5 mL via ORAL
  Filled 2012-09-23: qty 5

## 2012-09-23 MED ORDER — FAMOTIDINE 20 MG PO TABS
20.0000 mg | ORAL_TABLET | Freq: Every day | ORAL | Status: DC
  Administered 2012-09-23 – 2012-09-25 (×3): 20 mg via ORAL
  Filled 2012-09-23 (×3): qty 1

## 2012-09-23 MED ORDER — PIPERACILLIN-TAZOBACTAM 3.375 G IVPB
3.3750 g | Freq: Three times a day (TID) | INTRAVENOUS | Status: DC
  Administered 2012-09-23 – 2012-09-24 (×3): 3.375 g via INTRAVENOUS
  Filled 2012-09-23 (×5): qty 50

## 2012-09-23 MED ORDER — WARFARIN SODIUM 5 MG PO TABS
5.0000 mg | ORAL_TABLET | Freq: Once | ORAL | Status: AC
  Administered 2012-09-23: 5 mg via ORAL
  Filled 2012-09-23: qty 1

## 2012-09-23 MED ORDER — GUAIFENESIN ER 600 MG PO TB12
600.0000 mg | ORAL_TABLET | Freq: Two times a day (BID) | ORAL | Status: DC
  Administered 2012-09-23 (×3): 600 mg via ORAL
  Filled 2012-09-23 (×5): qty 1

## 2012-09-23 NOTE — Progress Notes (Signed)
TELEMETRY: Reviewed telemetry pt in atrial flutter with variable ventricular response. Currently HR 112 bpm. HR down in the 50s last night. 22 beat run of VT last pm @ 20:33: Filed Vitals:   09/23/12 0053 09/23/12 0400 09/23/12 0557 09/23/12 0749  BP: 130/74 95/69 113/70 108/56  Pulse: 132 68 95 65  Temp:  98 F (36.7 C)  98.5 F (36.9 C)  TempSrc:  Axillary  Axillary  Resp:  35  24  Height:      Weight:  140 lb 14 oz (63.9 kg)    SpO2:    96%    Intake/Output Summary (Last 24 hours) at 09/23/12 0905 Last data filed at 09/23/12 0800  Gross per 24 hour  Intake    500 ml  Output      0 ml  Net    500 ml    SUBJECTIVE Patient denies any complaints today. No palpitations, SOB, chest pain, or dizzyness. Coarse cough.  LABS: Basic Metabolic Panel:  Recent Labs  16/10/96 0500 09/22/12 0500  NA 139 140  K 3.8 3.4*  CL 108 107  CO2 23 25  GLUCOSE 102* 95  BUN 10 7  CREATININE 0.63 0.59  CALCIUM 8.5 8.4  MG 2.1  --   PHOS 1.8* 2.7   Liver Function Tests:  Recent Labs  09/22/12 0500  AST 49*  ALT 44  ALKPHOS 63  BILITOT 0.6  PROT 5.7*  ALBUMIN 2.2*   CBC:  Recent Labs  09/22/12 0500 09/23/12 0800  WBC 12.4* 10.1  NEUTROABS 9.5*  --   HGB 12.0* 12.0*  HCT 32.9* 34.1*  MCV 89.2 90.2  PLT 149* 192   Radiology/Studies:  Dg Chest Port 1 View  09/23/2012   *RADIOLOGY REPORT*  Clinical Data: Short of breath.  History of CHF.  PORTABLE CHEST - 1 VIEW  Comparison: Chest x-ray from yesterday.  Findings: Right subclavian central venous catheter, tip at the superior cavoatrial junction.  Mild cardiomegaly, chronic.  Partial clearing of lower lung opacities, which still persist.  The diaphragm is better seen currently.  Negative for edema.  No pneumothorax.  IMPRESSION:  1.  Improved lower lung aeration.  2. No pulmonary edema.   Original Report Authenticated By: Tiburcio Pea   Dg Chest Port 1 View  09/22/2012   *RADIOLOGY REPORT*  Clinical Data: Cough, assess  infiltrates  PORTABLE CHEST - 1 VIEW  Comparison: 09/19/2012  Findings: Mild patchy bilateral lower lobe opacities, suspicious for pneumonia, less likely atelectasis. When compared to prior study, this is mildly progressed.  No definite pleural effusion.  No pneumothorax.  The heart is normal in size.  Stable right subclavian venous catheter terminating at the cavoatrial junction.  IMPRESSION: Mild patchy bilateral lower lobe opacities, suspicious for pneumonia, less likely atelectasis.   Original Report Authenticated By: Charline Bills, M.D.   Dg Chest Port 1 View  09/19/2012   *RADIOLOGY REPORT*  Clinical Data: Central line placement  PORTABLE CHEST - 1 VIEW  Comparison: 09/19/2012 1700 hours  Findings: Status post placement of right subclavian central line with tip projecting over the cavoatrial junction.  No pneumothorax. Mild bilateral lower lobe infiltrates have developed.  Otherwise unchanged.  IMPRESSION: Central line as described. Development of mild bilateral lower lobe infiltrates.   Original Report Authenticated By: Esperanza Heir, M.D.   Dg Chest Portable 1 View  09/19/2012   *RADIOLOGY REPORT*  Clinical Data: Sepsis.  PORTABLE CHEST - 1 VIEW  Comparison: 12/23/2011  Findings: There is  no evidence of pulmonary infiltrate, edema or pleural effusion.  The heart size and mediastinal contours are within normal limits.  No bony abnormalities are seen.  IMPRESSION: No active disease.   Original Report Authenticated By: Irish Lack, M.D.    PHYSICAL EXAM General: elderly, well nourished, in no acute distress. Head: Normal Neck: Negative for carotid bruits. JVD not elevated. Lungs: Coarse bilateral rhonchi. Heart: IRRR S1 S2 without murmurs, rubs, or gallops.  Abdomen: Soft, non-tender, non-distended with normoactive bowel sounds. No hepatomegaly. No rebound/guarding.  Msk:  Strength and tone appears normal for age. Extremities: No clubbing, cyanosis or edema.  Distal pedal pulses are 2+  and equal bilaterally. Neuro: Alert, confused Psych:  Responds to questions appropriately with a normal affect.  ASSESSMENT AND PLAN: 1. Atrial flutter- chronic. HR increased secondary to recent stressors with PNA/ sepsis. I am concerned about bradycardia during the night. History of bradycardia in setting of dig and coreg in the past. I would favor using metoprolol only in this setting. Will DC digoxin. Continue current metoprolol dose.  2. Septic shock. 3. Ischemic cardiomyopathy. No overt CHF.  Active Problems:   Atrial flutter   Ischemic cardiomyopathy   HTN (hypertension)   Hypothyroidism   Healthcare-associated pneumonia   Chronic systolic congestive heart failure, NYHA class 3   Dehydration   Metabolic encephalopathy   COPD, mild   Thrombocytopenia   Warfarin anticoagulation   Septic shock(785.52)   Acute respiratory failure with hypoxia   Hypokalemia    Signed, Reshawn Ostlund [place] MD,FACC 09/23/2012 9:12 AM

## 2012-09-23 NOTE — Progress Notes (Signed)
Clinical Social Work Department BRIEF PSYCHOSOCIAL ASSESSMENT 09/23/2012  Patient:  David [name], David [name]     Account Number:  [id number]     Admit date:  09/19/2012  Clinical Social Worker:  Robin Searing  Date/Time:  09/23/2012 02:49 PM  Referred by:  Care Management  Date Referred:  09/23/2012 Referred for  ALF Placement   Other Referral:   Interview type:  Other - See comment Other interview type:   Spoke with patient and with ALF staff- message left for family to return my call.    PSYCHOSOCIAL DATA Living Status:  FACILITY Admitted from facility:  [facility] Level of care:  Assisted Living Primary support name:  sisterMeriam Sprague [phone] Primary support relationship to patient:  FAMILY Degree of support available:   good    CURRENT CONCERNS Current Concerns  Post-Acute Placement   Other Concerns:    SOCIAL WORK ASSESSMENT / PLAN Patient admitted from [facility]F- she has been there for about 5 years and plan is for return at d/c.   Assessment/plan status:  Other - See comment Other assessment/ plan:   FL2 for ALF return   Information/referral to community resources:   [facility]  SNF for ?higher level of  care    PATIENT'S/FAMILY'S RESPONSE TO PLAN OF CARE: Plan for return to ALF at d/c- CSW to follow to further assess level of care/needs at d/c.  Also awaiting callback from family-       Reece Levy, MSW [phone]

## 2012-09-23 NOTE — Progress Notes (Signed)
ANTICOAGULATION CONSULT NOTE   Pharmacy Consult for Coumadin Indication: atrial fibrillation  Patient Measurements: Height: 6' (182.9 cm) Weight: 140 lb 14 oz (63.9 kg) IBW/kg (Calculated) : 77.6 Vital Signs: Temp: 98.5 F (36.9 C) (09/03 0749) Temp src: Axillary (09/03 0749) BP: 108/56 mmHg (09/03 0749) Pulse Rate: 65 (09/03 0749) Labs:  Recent Labs  09/21/12 0500 09/22/12 0500 09/23/12 0500 09/23/12 0800  HGB 12.1* 12.0*  --  12.0*  HCT 34.1* 32.9*  --  34.1*  PLT 156 149*  --  192  LABPROT 27.6* 33.5* 29.9*  --   INR 2.68* 3.46* 2.98*  --   CREATININE 0.63 0.59  --  0.59    Estimated Creatinine Clearance: 75.4 ml/min (by C-G formula based on Cr of 0.59).  Assessment: 65 YOM with history of atrial fibrillation on chronic Coumadin. INR was trending up sharply while on IV antibiotics, IV amiodarone, and NPO. Patient given lower dose on 9/1 but still with sharp rise in INR 9/2. Note that IV Levaquin was discontinued due to prolonged QTc.   INR is now trending down and diet is being advanced. Zosyn does continue. With significant drop in INR overnight will restart warfarin tonight. Amio stopped 9/2.  Goal of Therapy:  INR 2-3   Plan:  1. Warfarin 5mg  tonight 2. Follow-up PT/INR in AM.   Sheppard Coil PharmD., BCPS Clinical Pharmacist Pager [phone] 09/23/2012 9:40 AM

## 2012-09-23 NOTE — Progress Notes (Signed)
Blood sugar was low this morning, breakfast was given, blood sugar rechecked, 121.

## 2012-09-23 NOTE — Progress Notes (Signed)
Patient is only oriented to self.  Unable to complete admission, no family is present and patient is not oriented to answer the questions.

## 2012-09-23 NOTE — Progress Notes (Signed)
@   2033. Pt had a 22 beat run of SVT. Notified [facility] .Pt asymptomatic. No new orders received.   @ ~ 0050 Pt coughing uncontrollably, HR sustaining 130s to 140s. Notified K.Kirby of pt status. New orders received.

## 2012-09-23 NOTE — Progress Notes (Signed)
Patient is calm and quite, lying in bed.  Alert to self, bed alarm is on.  He made three attempts to get out of bed.  Reoriented and encouraged to call for help if wants to get out of bed.  Has a sitter order but no sitter is available at this time.  All nursing staff is made aware of pt's high fall risk.

## 2012-09-23 NOTE — Progress Notes (Signed)
[facility] Progress Note [place] TEAM 1 - Stepdown ICU Team   Cadence Haslam GLO:[id number] DOB: March 19, 1940 DOA: 09/19/2012 PCP: Mikki Harbor  Brief narrative: 72 y/o man with past medical history significant for CHF (EF 20% as of 12/2011), atrial flutter on coumadin and COPD who was transported to [facility] ED on the evening of 8/30 from his nursing home for diarrhea and altered mental status. No further history could be provided by the accompanying nurse and no family was present with the patient. In the ED he was found to be febrile to 102 and hypotensive. He was given 3.5L of normal saline but continued to be hypotensive. PCCM was then called for admission for septic shock. Mr. Parcel was started on levophed and given vancomycin and cefepime in the ED prior to admission. His mental status improved some and he did report that he had been coughing for the last few days and had been more short of breath. He has had pneumonia in the past according to his chart.    Assessment/Plan:  Septic shock -resolved -cultures have been negative but since admit pt has developed a cough and CXR c/w PNA -did require pressors initially  Acute respiratory failure with hypoxia due to ? Healthcare-associated pneumonia vs tracheobronchitis (COPD) -stable on cannula oxygen -pulmonary toilet/mobilize -cont anbx's - Levaquin dc'd 2/2 prolonged Qtc- resumed Zosyn 9/2  Dehydration -resolving  Metabolic encephalopathy/Dementia -seems to have baseline dementia and resides at [facility] -home psych meds held due to prolonged QTc - follow 12 lead EKG -getting OOB frequently w/o help - provide safety sitter    Atrial flutter with RVR/NSVT -intermittent PAF but HR more stable during rounds - Cards rec use BB only so have dc'd digoxin (9/3) - off amiodarone IV - keep K+ and Mg normal -had 22 beat run NSVT- QTc 421 -follow Mg and K  Ischemic cardiomyopathy/Chronic systolic congestive heart failure, NYHA  class 3 -repeat ECHO this admit EF up to 40-45% -cont med rxn -compensated  Hypokalemia -replete and follow  Hypoglycemia -not diabetic so dc CBG checks  HTN (hypertension)  Chronic Thrombocytopenia -platelets stable and > 100,000  Warfarin anticoagulation -INR subtherapeutic at admit - now mildly supratherapeutic- pharmacy managing   DVT prophylaxis: warfarin Code Status: DO NOT RESUSCITATE Family Communication: Patient - no family at bedside Disposition Plan/Expected LOS: Transfer to Telemetry  Consultants: PCCM-signed off Cardiology   Procedures: 2-D echocardiogram - Left ventricle: The cavity size was mildly dilated. Wall thickness was normal. Systolic function was mildly to moderately reduced. The estimated ejection fraction was in the range of 40% to 45%. Diffuse hypokinesis. - Mitral valve: Mild regurgitation. - Left atrium: The atrium was mildly dilated. - Right atrium: The atrium was mildly dilated.  Antibiotics: Cefepime 8/30 >>> 8/31  Vancomycin 8/30 >>>9/1  Levaquin 8/31 >>> 9/2 Zosyn 8/31 + 9/2 >>>   HPI/Subjective: Patient alert but confused. Not combative and will briefly respond to directions. No evidence of acute distrsss.  Unable to provide a reliable hx due to confusion.  Objective: Blood pressure 108/56, pulse 65, temperature 98.5 F (36.9 C), temperature source Axillary, resp. rate 24, height 6' (1.829 m), weight 63.9 kg (140 lb 14 oz), SpO2 96.00%.  Intake/Output Summary (Last 24 hours) at 09/23/12 1115 Last data filed at 09/23/12 0800  Gross per 24 hour  Intake    410 ml  Output      0 ml  Net    410 ml     Exam: General: No  acute respiratory distress Lungs: Coarse to auscultation bilaterally without wheezes or crackles, nasal cannula oxygen-wet sounding cough Cardiovascular: Irregular rate-occasionally tachycardic in the 130's - without murmur gallop or rub normal S1 and S2, no peripheral edema or JVD Abdomen: Nontender,  nondistended, soft, bowel sounds positive, no rebound, no ascites, no appreciable mass Musculoskeletal: No significant cyanosis, clubbing of bilateral lower extremities Neurological: Alert and oriented x name only, moves all extremities x 4 without focal neurological deficits, CN 2-12 intact  Scheduled Meds:  Scheduled Meds: . antiseptic oral rinse  15 mL Mouth Rinse BID  . DULoxetine  60 mg Oral Daily  . famotidine  20 mg Oral Daily  . guaiFENesin  600 mg Oral BID  . insulin aspart  0-15 Units Subcutaneous Q4H  . metoprolol  2.5 mg Intravenous Once  . metoprolol tartrate  25 mg Oral Q6H  . mirtazapine  15 mg Oral QHS  . piperacillin-tazobactam (ZOSYN)  IV  3.375 g Intravenous Q8H  . topiramate  25 mg Oral QHS  . warfarin  5 mg Oral ONCE-1800  . Warfarin - Pharmacist Dosing Inpatient   Does not apply q1800    Data Reviewed: Basic Metabolic Panel:  Recent Labs Lab 09/19/12 2303 09/20/12 0059 09/20/12 0412 09/21/12 0500 09/22/12 0500 09/23/12 0800  NA 136  --  137 139 140 140  K 4.5  --  3.9 3.8 3.4* 4.0  CL 108  --  109 108 107 108  CO2 20  --  21 23 25 26   GLUCOSE 183*  --  185* 102* 95 78  BUN 23  --  20 10 7 10   CREATININE 0.89  --  0.69 0.63 0.59 0.59  CALCIUM 7.8*  --  8.4 8.5 8.4 8.7  MG  --  2.0  --  2.1  --   --   PHOS  --   --   --  1.8* 2.7  --    Liver Function Tests:  Recent Labs Lab 09/19/12 1708 09/22/12 0500  AST 52* 49*  ALT 32 44  ALKPHOS 58 63  BILITOT 1.1 0.6  PROT 6.9 5.7*  ALBUMIN 3.1* 2.2*   CBC:  Recent Labs Lab 09/19/12 1708 09/19/12 1709 09/20/12 0412 09/21/12 0500 09/22/12 0500 09/23/12 0800  WBC 11.4*  --  15.4* 13.9* 12.4* 10.1  NEUTROABS  --   --   --   --  9.5*  --   HGB 14.1 13.6 12.3* 12.1* 12.0* 12.0*  HCT 38.8* 40.0 35.4* 34.1* 32.9* 34.1*  MCV 91.5  --  91.7 90.7 89.2 90.2  PLT 128*  --  131* 156 149* 192   Cardiac Enzymes:  Recent Labs Lab 09/19/12 1708  TROPONINI <0.30   CBG:  Recent Labs Lab  09/22/12 1557 09/22/12 2039 09/23/12 0038 09/23/12 0453 09/23/12 0820  GLUCAP 173* 105* 103* 128* 67*    Recent Results (from the past 240 hour(s))  CULTURE, BLOOD (ROUTINE X 2)     Status: None   Collection Time    09/19/12  4:45 PM      Result Value Range Status   Specimen Description BLOOD LEFT HAND   Final   Special Requests BOTTLES DRAWN AEROBIC AND ANAEROBIC R 5CC B 10CC   Final   Culture  Setup Time     Final   Value: 09/19/2012 19:31     Performed at [facility]   Culture     Final   Value:  BLOOD CULTURE RECEIVED NO GROWTH TO DATE CULTURE WILL BE HELD FOR 5 DAYS BEFORE ISSUING A FINAL NEGATIVE REPORT     Performed at [facility]   Report Status PENDING   Incomplete  CULTURE, BLOOD (ROUTINE X 2)     Status: None   Collection Time    09/19/12  5:00 PM      Result Value Range Status   Specimen Description BLOOD RIGHT HAND   Final   Special Requests BOTTLES DRAWN AEROBIC ONLY 10CC   Final   Culture  Setup Time     Final   Value: 09/19/2012 19:31     Performed at [facility]   Culture     Final   Value:        BLOOD CULTURE RECEIVED NO GROWTH TO DATE CULTURE WILL BE HELD FOR 5 DAYS BEFORE ISSUING A FINAL NEGATIVE REPORT     Performed at [facility]   Report Status PENDING   Incomplete  URINE CULTURE     Status: None   Collection Time    09/19/12  5:33 PM      Result Value Range Status   Specimen Description URINE, CATHETERIZED   Final   Special Requests NONE   Final   Culture  Setup Time     Final   Value: 09/19/2012 22:49     Performed at [facility] Count     Final   Value: NO GROWTH     Performed at [facility]   Culture     Final   Value: NO GROWTH     Performed at [facility]   Report Status 09/20/2012 FINAL   Final  MRSA PCR SCREENING     Status: None   Collection Time    09/19/12 10:12 PM      Result Value Range Status   MRSA by PCR NEGATIVE  NEGATIVE Final   Comment:             The GeneXpert MRSA Assay (FDA     approved for NASAL specimens     only), is one component of a     comprehensive MRSA colonization     surveillance program. It is not     intended to diagnose MRSA     infection nor to guide or     monitor treatment for     MRSA infections.     Studies:  Recent x-ray studies have been reviewed in detail by the Attending Physician   Junious Silk, ANP [facility] Office  [phone] Pager [phone]  **If unable to reach the above provider after paging please contact the Flow Manager @ [phone]  On-Call/Text Page:      [email or web address]      password [email or web address]  If 7PM-7AM, please contact night-coverage [email or web address] Password [email or web address] 09/23/2012, 11:15 AM   LOS: 4 days   I have personally examined this patient and reviewed the entire database. I have reviewed the above note, made any necessary editorial changes, and agree with its content.  Lonia Blood, MD [facility]

## 2012-09-23 NOTE — Evaluation (Signed)
Physical Therapy Evaluation Patient Details Name: David [name] MRN: [id number] DOB: [birth year] Today's Date: 09/23/2012 Time: 4098-1191 PT Time Calculation (min): 13 min  PT Assessment / Plan / Recommendation History of Present Illness  72 yo [place] bresident with past medical history of CHF (EF 20%), AF (on Coumadin) and COPD brought to ED with diarrhea, cough, fever and acute encephalopathy.  Admitted with septic shock.  Clinical Impression  Pt resident at [facility] ALF however due to pt's cognitive deficits pt to benefit from 24/7 supervision/assist. Pt with limited mobility this date due to pt deferring however couldn't report reason. Acute PT to con't to follow to progress mobility to assess for return to ALF however suspect pt to con't to require 24/7 supervision/assist at SNF level of care.    PT Assessment  Patient needs continued PT services    Follow Up Recommendations  Supervision/Assistance - 24 hour;SNF    Does the patient have the potential to tolerate intense rehabilitation      Barriers to Discharge        Equipment Recommendations       Recommendations for Other Services     Frequency Min 3X/week    Precautions / Restrictions Precautions Precautions: Fall Precaution Comments: dementia Restrictions Weight Bearing Restrictions: No   Pertinent Vitals/Pain Pt denies pain      Mobility  Bed Mobility Bed Mobility: Not assessed Transfers Transfers: Sit to Stand;Stand to Sit Sit to Stand: 4: Min guard;With upper extremity assist;With armrests;From chair/3-in-1 Stand to Sit: 4: Min guard;With upper extremity assist;With armrests;To chair/3-in-1 Details for Transfer Assistance: slow processing, increased time Ambulation/Gait Ambulation/Gait Assistance: 4: Min assist Ambulation Distance (Feet): 30 Feet Assistive device: Rolling walker Ambulation/Gait Assistance Details: amb 67' with RW and 15' without RW. pt very slow with delayed response. Pt unsure how to use  RW and deferred using it saying "i don't use that." Pt refused to amb in hallway however suspect pt would be capable of ambulating further Gait Pattern: Step-through pattern;Decreased stride length Gait velocity: dlow General Gait Details: unable to sequence with RW Stairs: No    Exercises     PT Diagnosis: Difficulty walking;Generalized weakness  PT Problem List: Decreased strength;Decreased activity tolerance;Decreased balance;Decreased mobility PT Treatment Interventions: DME instruction;Gait training;Functional mobility training;Therapeutic activities;Therapeutic exercise     PT Goals(Current goals can be found in the care plan section) Acute Rehab PT Goals Patient Stated Goal: did not state PT Goal Formulation: Patient unable to participate in goal setting Time For Goal Achievement: 09/30/12 Potential to Achieve Goals: Good  Visit Information  Last PT Received On: 09/23/12 Assistance Needed: +1 History of Present Illness: 72 yo [place] bresident with past medical history of CHF (EF 20%), AF (on Coumadin) and COPD brought to ED with diarrhea, cough, fever and acute encephalopathy.  Admitted with septic shock.       Prior Functioning  Home Living Family/patient expects to be discharged to:: Assisted living Home Equipment: None Additional Comments: pt questionable historian Prior Function Level of Independence: Independent Comments: pt reports not using a RW or cane however unsure Communication Communication:  (soft spoken, delayed response time) Dominant Hand: Right    Cognition  Cognition Arousal/Alertness: Awake/alert Behavior During Therapy: WFL for tasks assessed/performed Overall Cognitive Status: History of cognitive impairments - at baseline Area of Impairment: Safety/judgement;Problem solving Problem Solving: Slow processing    Extremity/Trunk Assessment Upper Extremity Assessment Upper Extremity Assessment: Overall WFL for tasks assessed Lower Extremity  Assessment Lower Extremity Assessment: Overall WFL for tasks assessed  Cervical / Trunk Assessment Cervical / Trunk Assessment: Normal   Balance    End of Session PT - End of Session Equipment Utilized During Treatment: Gait belt Activity Tolerance: Patient tolerated treatment well Patient left: in chair;with call bell/phone within reach;with nursing/sitter in room Nurse Communication: Mobility status  GP     Marcene Brawn 09/23/2012, 4:47 PM  Lewis Shock, PT, DPT Pager #: [phone] Office #: [phone]

## 2012-09-24 DIAGNOSIS — I2589 Other forms of chronic ischemic heart disease: Secondary | ICD-10-CM

## 2012-09-24 DIAGNOSIS — I4892 Unspecified atrial flutter: Secondary | ICD-10-CM

## 2012-09-24 LAB — COMPREHENSIVE METABOLIC PANEL
ALT: 71 U/L — ABNORMAL HIGH (ref 0–53)
AST: 62 U/L — ABNORMAL HIGH (ref 0–37)
Albumin: 2.4 g/dL — ABNORMAL LOW (ref 3.5–5.2)
CO2: 26 mEq/L (ref 19–32)
Calcium: 9.6 mg/dL (ref 8.4–10.5)
Creatinine, Ser: 0.63 mg/dL (ref 0.50–1.35)
Sodium: 140 mEq/L (ref 135–145)
Total Protein: 6.7 g/dL (ref 6.0–8.3)

## 2012-09-24 LAB — PROTIME-INR: INR: 2.47 — ABNORMAL HIGH (ref 0.00–1.49)

## 2012-09-24 MED ORDER — CLINDAMYCIN HCL 300 MG PO CAPS
300.0000 mg | ORAL_CAPSULE | Freq: Three times a day (TID) | ORAL | Status: DC
  Administered 2012-09-24 – 2012-09-25 (×4): 300 mg via ORAL
  Filled 2012-09-24 (×6): qty 1

## 2012-09-24 MED ORDER — FUROSEMIDE 20 MG PO TABS
10.0000 mg | ORAL_TABLET | Freq: Every day | ORAL | Status: DC
  Administered 2012-09-24 – 2012-09-25 (×2): 10 mg via ORAL
  Filled 2012-09-24 (×2): qty 0.5

## 2012-09-24 MED ORDER — CEFIXIME 400 MG PO TABS
400.0000 mg | ORAL_TABLET | Freq: Every day | ORAL | Status: DC
  Administered 2012-09-24 – 2012-09-25 (×2): 400 mg via ORAL
  Filled 2012-09-24 (×2): qty 1

## 2012-09-24 MED ORDER — WARFARIN SODIUM 7.5 MG PO TABS
7.5000 mg | ORAL_TABLET | Freq: Once | ORAL | Status: AC
  Administered 2012-09-24: 18:00:00 7.5 mg via ORAL
  Filled 2012-09-24: qty 1

## 2012-09-24 MED ORDER — FUROSEMIDE 20 MG PO TABS: 10.0000 mg | ORAL_TABLET | Freq: Every day | ORAL | Status: DC

## 2012-09-24 MED ORDER — GUAIFENESIN 200 MG PO TABS
400.0000 mg | ORAL_TABLET | Freq: Three times a day (TID) | ORAL | Status: DC
  Administered 2012-09-24 – 2012-09-25 (×4): 400 mg via ORAL
  Filled 2012-09-24 (×7): qty 2

## 2012-09-24 NOTE — Progress Notes (Signed)
Assessment completed.  Pt cooperative, oriented to self.  Agreed to wear SCDs, placed on pt.  Agreed to allow lab to draw blood (had refused per lab tech earlier this AM).  Called phlebotomy at [phone] and requested someone be sent to draw pt's labs.  Ardyth Gal, RN 09/24/2012

## 2012-09-24 NOTE — Progress Notes (Signed)
ANTICOAGULATION CONSULT NOTE   Pharmacy Consult for Coumadin Indication: atrial fibrillation  Patient Measurements: Height: 6' (182.9 cm) Weight: 136 lb 11.2 oz (62.007 kg) (a scale) IBW/kg (Calculated) : 77.6 Vital Signs: Temp: 97.7 F (36.5 C) (09/04 0557) Temp src: Oral (09/04 0557) BP: 113/60 mmHg (09/04 0557) Pulse Rate: 82 (09/04 0557) Labs:  Recent Labs  09/22/12 0500 09/23/12 0500 09/23/12 0800 09/24/12 0901  HGB 12.0*  --  12.0*  --   HCT 32.9*  --  34.1*  --   PLT 149*  --  192  --   LABPROT 33.5* 29.9*  --  25.9*  INR 3.46* 2.98*  --  2.47*  CREATININE 0.59  --  0.59 0.63    Estimated Creatinine Clearance: 73.2 ml/min (by C-G formula based on Cr of 0.63).  Assessment: 82 YOM with history of atrial fibrillation on chronic Coumadin. Supratherapeutic INR earlier this week d/t IV levaquin, IV amiodarone, and NPO. Now diet is being advanced, levaquin and amio are both d/c'd coumadin resumed yesterday. INR down to 2.47. No bleeding noted per chart.  PTA dose: 7.5 mg on thurs and sundays then 5 mg the rest of the week  Goal of Therapy:  INR 2-3   Plan:  1. Warfarin 7.5mg  tonight 2. Follow-up PT/INR in AM.   Bayard Hugger, PharmD, BCPS  Clinical Pharmacist  Pager: [phone]   09/24/2012 10:57 AM

## 2012-09-24 NOTE — Progress Notes (Signed)
Physical Therapy Treatment Patient Details Name: David [name] MRN: [id number] DOB: 02/10/1940 Today's Date: 09/24/2012 Time: 1410-1436 PT Time Calculation (min): 26 min  PT Assessment / Plan / Recommendation  History of Present Illness 72 yo [place] bresident with past medical history of CHF (EF 20%), AF (on Coumadin) and COPD brought to ED with diarrhea, cough, fever and acute encephalopathy.  Admitted with septic shock.   PT Comments   Pt continues to be very slow to move and needs extra time to process and complete task.  Pt refused any further exercises at end of session.  Question if pt close to baseline.  Spoke with SW and agree to return ALF with HHPT if facility able to provide the care needed to safely return.  If not will recommends SNF.    Follow Up Recommendations  Supervision/Assistance - 24 hour;SNF     Equipment Recommendations  None recommended by PT    Frequency Min 3X/week   Progress towards PT Goals Progress towards PT goals: Progressing toward goals  Plan Current plan remains appropriate    Precautions / Restrictions Precautions Precautions: Fall Precaution Comments: dementia Restrictions Weight Bearing Restrictions: No   Pertinent Vitals/Pain No c/o pain    Mobility  Bed Mobility Bed Mobility: Supine to Sit Supine to Sit: 4: Min assist;With rails Details for Bed Mobility Assistance: (A) to elevate trunk OOB.  Pt very slow to process and needs extra time to complete task. Transfers Transfers: Sit to Stand;Stand to Sit Sit to Stand: 4: Min assist;From bed;With armrests Stand to Sit: 4: Min assist;To chair/3-in-1 Details for Transfer Assistance: slow processing, increased time Ambulation/Gait Ambulation/Gait Assistance: 4: Min assist Ambulation Distance (Feet): 100 Feet Assistive device: Rolling walker Ambulation/Gait Assistance Details: (A) to maintain balance and maneuver RW.  Pt continues to be very slow to respond and extr time to complete task.  Many  cues to increase gait speed and step length. Gait Pattern: Step-through pattern;Decreased stride length General Gait Details: unable to sequence with RW Stairs: No    Exercises     PT Diagnosis:    PT Problem List:   PT Treatment Interventions:     PT Goals (current goals can now be found in the care plan section) Acute Rehab PT Goals Patient Stated Goal: did not state PT Goal Formulation: Patient unable to participate in goal setting Time For Goal Achievement: 09/30/12 Potential to Achieve Goals: Good  Visit Information  Last PT Received On: 09/24/12 Assistance Needed: +1 History of Present Illness: 72 yo [place] bresident with past medical history of CHF (EF 20%), AF (on Coumadin) and COPD brought to ED with diarrhea, cough, fever and acute encephalopathy.  Admitted with septic shock.    Subjective Data  Subjective: "I'm not going to do any exercises." Patient Stated Goal: did not state   Cognition  Cognition Arousal/Alertness: Awake/alert Behavior During Therapy: Flat affect Overall Cognitive Status: History of cognitive impairments - at baseline Area of Impairment: Safety/judgement;Problem solving Problem Solving: Slow processing    Balance     End of Session PT - End of Session Equipment Utilized During Treatment: Gait belt Activity Tolerance: Patient tolerated treatment well Patient left: in chair;with call bell/phone within reach;with nursing/sitter in room Nurse Communication: Mobility status   GP     David [name] 09/24/2012, 2:43 PM Jake Shark, PT DPT [phone]

## 2012-09-24 NOTE — Progress Notes (Signed)
TELEMETRY: Reviewed telemetry pt in atrial flutter with controlled ventricular response. No VT: Filed Vitals:   09/23/12 2131 09/24/12 0130 09/24/12 0548 09/24/12 0557  BP:  130/75  113/60  Pulse:  67  82  Temp:  98.4 F (36.9 C)  97.7 F (36.5 C)  TempSrc:  Oral  Oral  Resp: 22 20  20   Height:      Weight:   136 lb 11.2 oz (62.007 kg) 136 lb 11.2 oz (62.007 kg)  SpO2:  100%  97%    Intake/Output Summary (Last 24 hours) at 09/24/12 0736 Last data filed at 09/24/12 0558  Gross per 24 hour  Intake    620 ml  Output   2100 ml  Net  -1480 ml    SUBJECTIVE  Coarse cough. Complains of some SOB and chest pain when he coughs.  LABS: Basic Metabolic Panel:  Recent Labs  16/10/96 0500 09/23/12 0800  NA 140 140  K 3.4* 4.0  CL 107 108  CO2 25 26  GLUCOSE 95 78  BUN 7 10  CREATININE 0.59 0.59  CALCIUM 8.4 8.7  PHOS 2.7  --    Liver Function Tests:  Recent Labs  09/22/12 0500  AST 49*  ALT 44  ALKPHOS 63  BILITOT 0.6  PROT 5.7*  ALBUMIN 2.2*   CBC:  Recent Labs  09/22/12 0500 09/23/12 0800  WBC 12.4* 10.1  NEUTROABS 9.5*  --   HGB 12.0* 12.0*  HCT 32.9* 34.1*  MCV 89.2 90.2  PLT 149* 192   Radiology/Studies:  Dg Chest Port 1 View  09/23/2012   *RADIOLOGY REPORT*  Clinical Data: Short of breath.  History of CHF.  PORTABLE CHEST - 1 VIEW  Comparison: Chest x-ray from yesterday.  Findings: Right subclavian central venous catheter, tip at the superior cavoatrial junction.  Mild cardiomegaly, chronic.  Partial clearing of lower lung opacities, which still persist.  The diaphragm is better seen currently.  Negative for edema.  No pneumothorax.  IMPRESSION:  1.  Improved lower lung aeration.  2. No pulmonary edema.   Original Report Authenticated By: Tiburcio Pea   Dg Chest Port 1 View  09/22/2012   *RADIOLOGY REPORT*  Clinical Data: Cough, assess infiltrates  PORTABLE CHEST - 1 VIEW  Comparison: 09/19/2012  Findings: Mild patchy bilateral lower lobe  opacities, suspicious for pneumonia, less likely atelectasis. When compared to prior study, this is mildly progressed.  No definite pleural effusion.  No pneumothorax.  The heart is normal in size.  Stable right subclavian venous catheter terminating at the cavoatrial junction.  IMPRESSION: Mild patchy bilateral lower lobe opacities, suspicious for pneumonia, less likely atelectasis.   Original Report Authenticated By: Charline Bills, M.D.   Dg Chest Port 1 View  09/19/2012   *RADIOLOGY REPORT*  Clinical Data: Central line placement  PORTABLE CHEST - 1 VIEW  Comparison: 09/19/2012 1700 hours  Findings: Status post placement of right subclavian central line with tip projecting over the cavoatrial junction.  No pneumothorax. Mild bilateral lower lobe infiltrates have developed.  Otherwise unchanged.  IMPRESSION: Central line as described. Development of mild bilateral lower lobe infiltrates.   Original Report Authenticated By: Esperanza Heir, M.D.   Dg Chest Portable 1 View  09/19/2012   *RADIOLOGY REPORT*  Clinical Data: Sepsis.  PORTABLE CHEST - 1 VIEW  Comparison: 12/23/2011  Findings: There is no evidence of pulmonary infiltrate, edema or pleural effusion.  The heart size and mediastinal contours are within normal limits.  No bony  abnormalities are seen.  IMPRESSION: No active disease.   Original Report Authenticated By: Irish Lack, M.D.    PHYSICAL EXAM General: elderly, well nourished, in no acute distress. Head: Normal Neck: Negative for carotid bruits. JVD not elevated. Lungs: Coarse BS and bilateral rhonchi. Heart: IRRR S1 S2 without murmurs, rubs, or gallops.  Abdomen: Soft, non-tender, non-distended with normoactive bowel sounds. No hepatomegaly. No rebound/guarding.  Msk:  Strength and tone appears normal for age. Extremities: No clubbing, cyanosis or edema.  Distal pedal pulses are 2+ and equal bilaterally. Neuro: Alert, confused Psych:  Responds to questions appropriately with a  flat affect.  ASSESSMENT AND PLAN: 1. Atrial flutter- chronic. HR increased secondary to recent stressors with PNA/ sepsis.Currently rate well controlled on metoprolol alone. Dig DC'd due to history of bradycardia. Will follow 2. Septic shock/PNA 3. Ischemic cardiomyopathy. No overt CHF.  Active Problems:   Atrial flutter   Ischemic cardiomyopathy   HTN (hypertension)   Hypothyroidism   Healthcare-associated pneumonia   Chronic systolic congestive heart failure, NYHA class 3   Dehydration   Metabolic encephalopathy   COPD, mild   Thrombocytopenia   Warfarin anticoagulation   Septic shock(785.52)   Acute respiratory failure with hypoxia   Hypokalemia    Signed, Peter [place] MD,FACC 09/24/2012 7:36 AM

## 2012-09-24 NOTE — Progress Notes (Signed)
[facility] PROGRESS NOTE Interim History: 72 y/o man with past medical history significant for CHF (EF 20% as of 12/2011), atrial flutter on coumadin and COPD who was transported to [facility] ED on the evening of 8/30 from his nursing home for diarrhea and altered mental status. No further history could be provided by the accompanying nurse and no family was present with the patient. In the ED he was found to be febrile to 102 and hypotensive. He was given 3.5L of normal saline but continued to be hypotensive. PCCM was then called for admission for septic shock. Mr. Choe was started on levophed and given vancomycin and cefepime in the ED prior to admission. His mental status improved some and he did report that he had been coughing for the last few days and had been more short of breath. He has had pneumonia in the past according to his chart   Assessment/Plan: Septic shock(785.52) - Resolved  - Urine, blood Cultures 8.30.2014 negative.  - Did require pressors initially.  Acute respiratory failure with hypoxia due to ? Healthcare-associated pneumonia vs tracheobronchitis (COPD)  - Stable on cannula oxygen  - Pulmonary toilet/mobilize  - Cont anbx's - Levaquin dc'd 2/2 prolonged Qtc- resumed Zosyn 9/2, change to Suprax and clindamycin for 10 days total.  Metabolic encephalopathy/Dementia  - Seems to have baseline dementia and resides at [facility]  - Home psych meds held due to prolonged QTc, repeated EKG showed improved QTc. - Getting OOB frequently w/o help - provide safety sitter   Atrial flutter with RVR/NSVT  - Cardiology rec use BB only so have dc'd digoxin (9/3) - off amiodarone IV. - Keep Electrolytes  Close to MG  2.0, K 4.0.  Ischemic cardiomyopathy/Chronic systolic congestive heart failure, NYHA class 3  - Repeat ECHO this admit EF up to 40-45%  - Cont med rxn  - Compensated, +JVD and HJ reflex, start low dose lasix. - ON betab blocker, add low dose lasix restrict fluids.  Has +JVD and HJ reflex, crackles on the left. EF has improved significantly since december.  Warfarin anticoagulation: - INR therapeutic.  Thrombocytopenia - resolved.  Code Status: DO NOT RESUSCITATE  Family Communication: Patient - no family at bedside  Disposition Plan/Expected LOS: Transfer to Telemetry    Consultants:  Cardiology   Procedures:  ECHO  Antibiotics:  Zosyn  HPI/Subjective: Pt has no complains.  Objective: Filed Vitals:   09/23/12 2131 09/24/12 0130 09/24/12 0548 09/24/12 0557  BP:  130/75  113/60  Pulse:  67  82  Temp:  98.4 F (36.9 C)  97.7 F (36.5 C)  TempSrc:  Oral  Oral  Resp: 22 20  20   Height:      Weight:   62.007 kg (136 lb 11.2 oz) 62.007 kg (136 lb 11.2 oz)  SpO2:  100%  97%    Intake/Output Summary (Last 24 hours) at 09/24/12 0748 Last data filed at 09/24/12 0558  Gross per 24 hour  Intake    620 ml  Output   2100 ml  Net  -1480 ml   Filed Weights   09/23/12 1520 09/24/12 0548 09/24/12 0557  Weight: 62.6 kg (138 lb 0.1 oz) 62.007 kg (136 lb 11.2 oz) 62.007 kg (136 lb 11.2 oz)    Exam:  General: Alert, awake, oriented x3, in no acute distress.  HEENT: No bruits, no goiter. +JVD and HJ reflex. Heart: Regular rate and rhythm, without murmurs, rubs, gallops.  Lungs: Good air movement, bilateral air movement.  Data Reviewed: Basic Metabolic Panel:  Recent Labs Lab 09/19/12 2303 09/20/12 0059 09/20/12 0412 09/21/12 0500 09/22/12 0500 09/23/12 0800  NA 136  --  137 139 140 140  K 4.5  --  3.9 3.8 3.4* 4.0  CL 108  --  109 108 107 108  CO2 20  --  21 23 25 26   GLUCOSE 183*  --  185* 102* 95 78  BUN 23  --  20 10 7 10   CREATININE 0.89  --  0.69 0.63 0.59 0.59  CALCIUM 7.8*  --  8.4 8.5 8.4 8.7  MG  --  2.0  --  2.1  --   --   PHOS  --   --   --  1.8* 2.7  --    Liver Function Tests:  Recent Labs Lab 09/19/12 1708 09/22/12 0500  AST 52* 49*  ALT 32 44  ALKPHOS 58 63  BILITOT 1.1 0.6  PROT 6.9 5.7*   ALBUMIN 3.1* 2.2*   No results found for this basename: LIPASE, AMYLASE,  in the last 168 hours No results found for this basename: AMMONIA,  in the last 168 hours CBC:  Recent Labs Lab 09/19/12 1708 09/19/12 1709 09/20/12 0412 09/21/12 0500 09/22/12 0500 09/23/12 0800  WBC 11.4*  --  15.4* 13.9* 12.4* 10.1  NEUTROABS  --   --   --   --  9.5*  --   HGB 14.1 13.6 12.3* 12.1* 12.0* 12.0*  HCT 38.8* 40.0 35.4* 34.1* 32.9* 34.1*  MCV 91.5  --  91.7 90.7 89.2 90.2  PLT 128*  --  131* 156 149* 192   Cardiac Enzymes:  Recent Labs Lab 09/19/12 1708  TROPONINI <0.30   BNP (last 3 results) No results found for this basename: PROBNP,  in the last 8760 hours CBG:  Recent Labs Lab 09/22/12 2039 09/23/12 0038 09/23/12 0453 09/23/12 0820 09/23/12 1151  GLUCAP 105* 103* 128* 67* 126*    Recent Results (from the past 240 hour(s))  CULTURE, BLOOD (ROUTINE X 2)     Status: None   Collection Time    09/19/12  4:45 PM      Result Value Range Status   Specimen Description BLOOD LEFT HAND   Final   Special Requests BOTTLES DRAWN AEROBIC AND ANAEROBIC R 5CC B 10CC   Final   Culture  Setup Time     Final   Value: 09/19/2012 19:31     Performed at [facility]   Culture     Final   Value:        BLOOD CULTURE RECEIVED NO GROWTH TO DATE CULTURE WILL BE HELD FOR 5 DAYS BEFORE ISSUING A FINAL NEGATIVE REPORT     Performed at [facility]   Report Status PENDING   Incomplete  CULTURE, BLOOD (ROUTINE X 2)     Status: None   Collection Time    09/19/12  5:00 PM      Result Value Range Status   Specimen Description BLOOD RIGHT HAND   Final   Special Requests BOTTLES DRAWN AEROBIC ONLY 10CC   Final   Culture  Setup Time     Final   Value: 09/19/2012 19:31     Performed at [facility]   Culture     Final   Value:        BLOOD CULTURE RECEIVED NO GROWTH TO DATE CULTURE WILL BE HELD FOR 5 DAYS BEFORE ISSUING A FINAL NEGATIVE REPORT  Performed at [facility]   Report Status PENDING   Incomplete  URINE CULTURE     Status: None   Collection Time    09/19/12  5:33 PM      Result Value Range Status   Specimen Description URINE, CATHETERIZED   Final   Special Requests NONE   Final   Culture  Setup Time     Final   Value: 09/19/2012 22:49     Performed at [facility] Count     Final   Value: NO GROWTH     Performed at [facility]   Culture     Final   Value: NO GROWTH     Performed at [facility]   Report Status 09/20/2012 FINAL   Final  MRSA PCR SCREENING     Status: None   Collection Time    09/19/12 10:12 PM      Result Value Range Status   MRSA by PCR NEGATIVE  NEGATIVE Final   Comment:            The GeneXpert MRSA Assay (FDA     approved for NASAL specimens     only), is one component of a     comprehensive MRSA colonization     surveillance program. It is not     intended to diagnose MRSA     infection nor to guide or     monitor treatment for     MRSA infections.     Studies: Dg Chest Port 1 View  09/23/2012   *RADIOLOGY REPORT*  Clinical Data: Short of breath.  History of CHF.  PORTABLE CHEST - 1 VIEW  Comparison: Chest x-ray from yesterday.  Findings: Right subclavian central venous catheter, tip at the superior cavoatrial junction.  Mild cardiomegaly, chronic.  Partial clearing of lower lung opacities, which still persist.  The diaphragm is better seen currently.  Negative for edema.  No pneumothorax.  IMPRESSION:  1.  Improved lower lung aeration.  2. No pulmonary edema.   Original Report Authenticated By: Tiburcio Pea    Scheduled Meds: . antiseptic oral rinse  15 mL Mouth Rinse BID  . DULoxetine  60 mg Oral Daily  . famotidine  20 mg Oral Daily  . guaiFENesin  400 mg Oral Q8H  . metoprolol  2.5 mg Intravenous Once  . metoprolol tartrate  25 mg Oral Q6H  . mirtazapine  15 mg Oral QHS  . piperacillin-tazobactam (ZOSYN)  IV  3.375 g Intravenous Q8H  . topiramate  25 mg  Oral QHS  . Warfarin - Pharmacist Dosing Inpatient   Does not apply q1800   Continuous Infusions:    Marinda Elk  [facility] Pager [phone]. If 8PM-8AM, please contact night-coverage at [email or web address], password [email or web address] 09/24/2012, 7:48 AM  LOS: 5 days

## 2012-09-25 DIAGNOSIS — I10 Essential (primary) hypertension: Secondary | ICD-10-CM

## 2012-09-25 DIAGNOSIS — A419 Sepsis, unspecified organism: Secondary | ICD-10-CM

## 2012-09-25 DIAGNOSIS — I2589 Other forms of chronic ischemic heart disease: Secondary | ICD-10-CM

## 2012-09-25 DIAGNOSIS — G9341 Metabolic encephalopathy: Secondary | ICD-10-CM

## 2012-09-25 LAB — CULTURE, BLOOD (ROUTINE X 2): Culture: NO GROWTH

## 2012-09-25 LAB — BASIC METABOLIC PANEL
CO2: 24 mEq/L (ref 19–32)
Calcium: 9 mg/dL (ref 8.4–10.5)
Chloride: 104 mEq/L (ref 96–112)
Glucose, Bld: 90 mg/dL (ref 70–99)
Potassium: 4.1 mEq/L (ref 3.5–5.1)
Sodium: 138 mEq/L (ref 135–145)

## 2012-09-25 LAB — PROTIME-INR: INR: 3.7 — ABNORMAL HIGH (ref 0.00–1.49)

## 2012-09-25 MED ORDER — CEFIXIME 400 MG PO TABS: 400.0000 mg | ORAL_TABLET | Freq: Every day | ORAL | Status: DC

## 2012-09-25 MED ORDER — CLINDAMYCIN HCL 300 MG PO CAPS: 300.0000 mg | ORAL_CAPSULE | Freq: Three times a day (TID) | ORAL | Status: DC

## 2012-09-25 MED ORDER — QUETIAPINE FUMARATE 50 MG PO TABS
50.0000 mg | ORAL_TABLET | Freq: Two times a day (BID) | ORAL | Status: DC
  Administered 2012-09-25: 50 mg via ORAL
  Filled 2012-09-25 (×2): qty 1

## 2012-09-25 MED ORDER — METOPROLOL TARTRATE 25 MG PO TABS
25.0000 mg | ORAL_TABLET | Freq: Two times a day (BID) | ORAL | Status: DC
  Filled 2012-09-25: qty 1

## 2012-09-25 NOTE — Discharge Summary (Signed)
Physician Discharge Summary  David [name] ZOX:[id number] DOB: July 12, 1940 DOA: 09/19/2012  PCP: Mikki Harbor  Admit date: 09/19/2012 Discharge date: 09/25/2012  Time spent: 35 minutes  Recommendations for Outpatient Follow-up:  1. Follow up with PCP check a b-met in 2 week. Avoid QT prolonging drugs. 2. PT at SNF.   Discharge Diagnoses:  Principal Problem:   Septic shock(785.52) Active Problems:   Atrial flutter   Ischemic cardiomyopathy   HTN (hypertension)   Hypothyroidism   Healthcare-associated pneumonia   Chronic systolic congestive heart failure, NYHA class 3   Dehydration   Metabolic encephalopathy   COPD, mild   Thrombocytopenia   Warfarin anticoagulation   Acute respiratory failure with hypoxia   Hypokalemia   Discharge Condition: stable  Diet recommendation: heart healthy  Filed Weights   09/24/12 0548 09/24/12 0557 09/25/12 0541  Weight: 62.007 kg (136 lb 11.2 oz) 62.007 kg (136 lb 11.2 oz) 60.283 kg (132 lb 14.4 oz)    History of present illness:  72 yo NH bresident with past medical history of CHF (EF 20%), AF (on Coumadin) and COPD brought to ED with diarrhea, cough, fever and acute encephalopathy. Admitted with septic shock.   Hospital Course:  Septic shock(785.52)  - Resolved  - Urine, blood Cultures 8.30.2014 negative.  - Did require levophed initially.   Acute respiratory failure with hypoxia due to ? Healthcare-associated pneumonia vs tracheobronchitis (COPD)  - Stable on cannula oxygen  - treated initially with vanc and cefepime, de-escalted to zosyn, changed to levaquin but Levaquin dc'd 2/2 prolonged Qtc. - change to Suprax and clindamycin for 10 days total.   Metabolic encephalopathy/Dementia  - Seems to have baseline dementia and resides at [facility]  - Home psych meds held due to prolonged QTc, repeated EKG showed improved QTc.  - Getting OOB frequently w/o help.  Atrial flutter with RVR/NSVT  - Cardiology rec use BB only so have dc'd  digoxin (9/3) - off amiodarone IV.  - remained rate controlled.  Ischemic cardiomyopathy/Chronic systolic congestive heart failure, NYHA class 3  - Repeat ECHO this admit EF up to 40-45%  - Cont med rxn  - Compensated, resume home dose diuretics.   Warfarin anticoagulation:  - INR therapeutic.   Thrombocytopenia  - resolved.   Procedures:  CXR  Consultations:  cardiology  Discharge Exam: Filed Vitals:   09/25/12 0541  BP: 95/54  Pulse: 71  Temp: 98.8 F (37.1 C)  Resp: 18    General: A&O x3 Cardiovascular: RRR Respiratory: Good air movement CTA B/L  Discharge Instructions  Discharge Orders   Future Appointments Provider Department Dept Phone   10/02/2012 12:45 PM [facility] [phone]   Future Orders Complete By Expires   Diet - low sodium heart healthy  As directed    Increase activity slowly  As directed        Medication List         albuterol 108 (90 BASE) MCG/ACT inhaler  Commonly known as:  PROVENTIL HFA;VENTOLIN HFA  Inhale 2 puffs into the lungs every 6 (six) hours as needed.     cefixime 400 MG tablet  Commonly known as:  SUPRAX  Take 1 tablet (400 mg total) by mouth daily.     clindamycin 300 MG capsule  Commonly known as:  CLEOCIN  Take 1 capsule (300 mg total) by mouth every 8 (eight) hours.     DULoxetine 60 MG capsule  Commonly known as:  CYMBALTA  Take 60  mg by mouth daily.     folic acid 1 MG tablet  Commonly known as:  FOLVITE  Take 1 tablet (1 mg total) by mouth daily.     guaiFENesin 600 MG 12 hr tablet  Commonly known as:  MUCINEX  Take 1 tablet (600 mg total) by mouth 2 (two) times daily.     HYDROcodone-acetaminophen 5-325 MG per tablet  Commonly known as:  NORCO/VICODIN  Take 1 tablet by mouth every 6 (six) hours as needed for pain.     irbesartan 75 MG tablet  Commonly known as:  AVAPRO  Take 1 tablet (75 mg total) by mouth daily.     lactulose 10 GM/15ML  solution  Commonly known as:  CHRONULAC  Take 30 mLs (20 g total) by mouth daily.     loratadine 10 MG tablet  Commonly known as:  CLARITIN  Take 10 mg by mouth daily.     meloxicam 7.5 MG tablet  Commonly known as:  MOBIC  Take 7.5 mg by mouth daily.     metoprolol tartrate 25 MG tablet  Commonly known as:  LOPRESSOR  Take 1 tablet (25 mg total) by mouth 2 (two) times daily.     mirtazapine 15 MG tablet  Commonly known as:  REMERON  Take 15 mg by mouth at bedtime.     multivitamin tablet  Take 1 tablet by mouth daily.     paliperidone 6 MG 24 hr tablet  Commonly known as:  INVEGA  Take 6 mg by mouth every morning.     QUEtiapine 50 MG tablet  Commonly known as:  SEROQUEL  Take 50 mg by mouth 2 (two) times daily.     QVAR 40 MCG/ACT inhaler  Generic drug:  beclomethasone  Inhale 2 puffs into the lungs 2 (two) times daily.     rosuvastatin 20 MG tablet  Commonly known as:  CRESTOR  Take 20 mg by mouth daily.     SENNA PLUS PO  Take 1 tablet by mouth 2 (two) times daily as needed (constipation).     thiamine 100 MG tablet  Commonly known as:  VITAMIN B-1  Take 100 mg by mouth daily.     topiramate 25 MG tablet  Commonly known as:  TOPAMAX  Take 25 mg by mouth at bedtime.     warfarin 5 MG tablet  Commonly known as:  COUMADIN  Take 1 tablet (5 mg total) by mouth daily.     warfarin 7.5 MG tablet  Commonly known as:  COUMADIN  Take 7.5 mg by mouth as directed. Takes 7.5 mg on thurs and sundays then 5 mg the rest of the week       Allergies  Allergen Reactions  . Oat Flour [Oat]        Follow-up Information   Follow up with Mikki Harbor (hospital follow up)    Specialty:  Family Medicine   Contact information:   [address] [place] [address] [phone]        The results of significant diagnostics from this hospitalization (including imaging, microbiology, ancillary and laboratory) are listed below for reference.    Significant  Diagnostic Studies: Dg Chest Port 1 View  09/23/2012   *RADIOLOGY REPORT*  Clinical Data: Short of breath.  History of CHF.  PORTABLE CHEST - 1 VIEW  Comparison: Chest x-ray from yesterday.  Findings: Right subclavian central venous catheter, tip at the superior cavoatrial junction.  Mild cardiomegaly, chronic.  Partial clearing of  lower lung opacities, which still persist.  The diaphragm is better seen currently.  Negative for edema.  No pneumothorax.  IMPRESSION:  1.  Improved lower lung aeration.  2. No pulmonary edema.   Original Report Authenticated By: Tiburcio Pea   Dg Chest Port 1 View  09/22/2012   *RADIOLOGY REPORT*  Clinical Data: Cough, assess infiltrates  PORTABLE CHEST - 1 VIEW  Comparison: 09/19/2012  Findings: Mild patchy bilateral lower lobe opacities, suspicious for pneumonia, less likely atelectasis. When compared to prior study, this is mildly progressed.  No definite pleural effusion.  No pneumothorax.  The heart is normal in size.  Stable right subclavian venous catheter terminating at the cavoatrial junction.  IMPRESSION: Mild patchy bilateral lower lobe opacities, suspicious for pneumonia, less likely atelectasis.   Original Report Authenticated By: Charline Bills, M.D.   Dg Chest Port 1 View  09/19/2012   *RADIOLOGY REPORT*  Clinical Data: Central line placement  PORTABLE CHEST - 1 VIEW  Comparison: 09/19/2012 1700 hours  Findings: Status post placement of right subclavian central line with tip projecting over the cavoatrial junction.  No pneumothorax. Mild bilateral lower lobe infiltrates have developed.  Otherwise unchanged.  IMPRESSION: Central line as described. Development of mild bilateral lower lobe infiltrates.   Original Report Authenticated By: Esperanza Heir, M.D.   Dg Chest Portable 1 View  09/19/2012   *RADIOLOGY REPORT*  Clinical Data: Sepsis.  PORTABLE CHEST - 1 VIEW  Comparison: 12/23/2011  Findings: There is no evidence of pulmonary infiltrate, edema or pleural  effusion.  The heart size and mediastinal contours are within normal limits.  No bony abnormalities are seen.  IMPRESSION: No active disease.   Original Report Authenticated By: Irish Lack, M.D.    Microbiology: Recent Results (from the past 240 hour(s))  CULTURE, BLOOD (ROUTINE X 2)     Status: None   Collection Time    09/19/12  4:45 PM      Result Value Range Status   Specimen Description BLOOD LEFT HAND   Final   Special Requests BOTTLES DRAWN AEROBIC AND ANAEROBIC R 5CC B 10CC   Final   Culture  Setup Time     Final   Value: 09/19/2012 19:31     Performed at [facility]   Culture     Final   Value: NO GROWTH 5 DAYS     Performed at [facility]   Report Status 09/25/2012 FINAL   Final  CULTURE, BLOOD (ROUTINE X 2)     Status: None   Collection Time    09/19/12  5:00 PM      Result Value Range Status   Specimen Description BLOOD RIGHT HAND   Final   Special Requests BOTTLES DRAWN AEROBIC ONLY 10CC   Final   Culture  Setup Time     Final   Value: 09/19/2012 19:31     Performed at [facility]   Culture     Final   Value: NO GROWTH 5 DAYS     Performed at [facility]   Report Status 09/25/2012 FINAL   Final  URINE CULTURE     Status: None   Collection Time    09/19/12  5:33 PM      Result Value Range Status   Specimen Description URINE, CATHETERIZED   Final   Special Requests NONE   Final   Culture  Setup Time     Final   Value: 09/19/2012 22:49  Performed at [facility] Count     Final   Value: NO GROWTH     Performed at [facility]   Culture     Final   Value: NO GROWTH     Performed at [facility]   Report Status 09/20/2012 FINAL   Final  MRSA PCR SCREENING     Status: None   Collection Time    09/19/12 10:12 PM      Result Value Range Status   MRSA by PCR NEGATIVE  NEGATIVE Final   Comment:            The GeneXpert MRSA Assay (FDA     approved for NASAL specimens     only), is one  component of a     comprehensive MRSA colonization     surveillance program. It is not     intended to diagnose MRSA     infection nor to guide or     monitor treatment for     MRSA infections.     Labs: Basic Metabolic Panel:  Recent Labs Lab 09/19/12 2303 09/20/12 0059 09/20/12 0412 09/21/12 0500 09/22/12 0500 09/23/12 0800 09/24/12 0901 09/25/12 0610  NA 136  --  137 139 140 140 140 138  K 4.5  --  3.9 3.8 3.4* 4.0 4.6 4.1  CL 108  --  109 108 107 108 105 104  CO2 20  --  21 23 25 26 26 24   GLUCOSE 183*  --  185* 102* 95 78 119* 90  BUN 23  --  20 10 7 10 9 9   CREATININE 0.89  --  0.69 0.63 0.59 0.59 0.63 0.56  CALCIUM 7.8*  --  8.4 8.5 8.4 8.7 9.6 9.0  MG  --  2.0  --  2.1  --   --   --   --   PHOS  --   --   --  1.8* 2.7  --   --   --    Liver Function Tests:  Recent Labs Lab 09/19/12 1708 09/22/12 0500 09/24/12 0901  AST 52* 49* 62*  ALT 32 44 71*  ALKPHOS 58 63 79  BILITOT 1.1 0.6 0.8  PROT 6.9 5.7* 6.7  ALBUMIN 3.1* 2.2* 2.4*   No results found for this basename: LIPASE, AMYLASE,  in the last 168 hours No results found for this basename: AMMONIA,  in the last 168 hours CBC:  Recent Labs Lab 09/19/12 1708 09/19/12 1709 09/20/12 0412 09/21/12 0500 09/22/12 0500 09/23/12 0800  WBC 11.4*  --  15.4* 13.9* 12.4* 10.1  NEUTROABS  --   --   --   --  9.5*  --   HGB 14.1 13.6 12.3* 12.1* 12.0* 12.0*  HCT 38.8* 40.0 35.4* 34.1* 32.9* 34.1*  MCV 91.5  --  91.7 90.7 89.2 90.2  PLT 128*  --  131* 156 149* 192   Cardiac Enzymes:  Recent Labs Lab 09/19/12 1708  TROPONINI <0.30   BNP: BNP (last 3 results) No results found for this basename: PROBNP,  in the last 8760 hours CBG:  Recent Labs Lab 09/22/12 2039 09/23/12 0038 09/23/12 0453 09/23/12 0820 09/23/12 1151  GLUCAP 105* 103* 128* 67* 126*       Signed:  FELIZ ORTIZ, ABRAHAM  [facility] 09/25/2012, 7:57 AM

## 2012-09-25 NOTE — Progress Notes (Signed)
09/25/12 1400 Noted pt. to dc back to [facility].  New home health orders for [facility] PT.  TC to [facility], with [facility], however they could not take pt. due to her insurance.   TC to Lupita Leash, with [facility], to give referral for [facility] PT.   Pt. to dc back to ALF today. Tera Mater, RN, BSN NCM [phone]

## 2012-09-25 NOTE — Progress Notes (Signed)
ANTICOAGULATION CONSULT NOTE   Pharmacy Consult for Coumadin Indication: atrial fibrillation  Patient Measurements: Height: 6' (182.9 cm) Weight: 132 lb 14.4 oz (60.283 kg) (scale A) IBW/kg (Calculated) : 77.6 Vital Signs: Temp: 98.8 F (37.1 C) (09/05 0541) Temp src: Oral (09/05 0541) BP: 95/54 mmHg (09/05 0541) Pulse Rate: 71 (09/05 0541) Labs:  Recent Labs  09/23/12 0500 09/23/12 0800 09/24/12 0901 09/25/12 0610  HGB  --  12.0*  --   --   HCT  --  34.1*  --   --   PLT  --  192  --   --   LABPROT 29.9*  --  25.9* 35.3*  INR 2.98*  --  2.47* 3.70*  CREATININE  --  0.59 0.63 0.56    Estimated Creatinine Clearance: 71.2 ml/min (by C-G formula based on Cr of 0.56).  Assessment: 59 YOM with history of atrial fibrillation on chronic Coumadin. Supratherapeutic INR earlier this week d/t IV levaquin, IV amiodarone, and NPO. Now diet is being advanced, levaquin and amio are both d/c'd. INR Jumped 2.47 > 3.7 after resumed on home dose for 2 days. No bleeding noted per chart. Plan for discharge today.  PTA dose: 7.5 mg on thurs and sundays then 5 mg the rest of the week  Goal of Therapy:  INR 2-3   Plan:  1. No coumadin today 2. Recommend monitor INR closely after discharge given recent labile INR. Patient might need lower dose.  Bayard Hugger, PharmD, BCPS  Clinical Pharmacist  Pager: [phone]

## 2012-09-25 NOTE — Progress Notes (Signed)
Per MD- patient is ok for for d/c today. Patient discussed with MD- as Physical Therapy has recommended SNF due to need for 24 hour supervision. Patient resides at [facility] and he feels that patient can be managed at the facility with Home Health follow up. This was arranged by RNCM- Ivonne Andrew for PT and RN.  Message left for patient's sisters re: d/c back to facility.  Patient wants to return to [facility];  CSW contacted Lurena Joiner at [facility] and updated Fl2 sent for review. Fl2 approved for patient to return and facility will pick him up. Nursing notified of d/c plan. No further CSW needs identified.    Lorri Frederick. [place] Pugh  [phone]

## 2012-09-25 NOTE — Progress Notes (Signed)
TELEMETRY: Reviewed telemetry pt in atrial flutter with controlled ventricular response. No VT: Filed Vitals:   09/24/12 1815 09/24/12 2101 09/25/12 0200 09/25/12 0541  BP: 108/68 129/64 103/62 95/54  Pulse: 74 73 64 71  Temp: 99.2 F (37.3 C) 98.7 F (37.1 C) 98.3 F (36.8 C) 98.8 F (37.1 C)  TempSrc: Oral Oral Oral Oral  Resp: 22 20 20 18   Height:      Weight:    132 lb 14.4 oz (60.283 kg)  SpO2: 97% 96% 92% 90%    Intake/Output Summary (Last 24 hours) at 09/25/12 0843 Last data filed at 09/25/12 0500  Gross per 24 hour  Intake 478.67 ml  Output   1951 ml  Net -1472.33 ml    SUBJECTIVE Cough better. No SOB.  LABS: Basic Metabolic Panel:  Recent Labs  16/10/96 0901 09/25/12 0610  NA 140 138  K 4.6 4.1  CL 105 104  CO2 26 24  GLUCOSE 119* 90  BUN 9 9  CREATININE 0.63 0.56  CALCIUM 9.6 9.0   Liver Function Tests:  Recent Labs  09/24/12 0901  AST 62*  ALT 71*  ALKPHOS 79  BILITOT 0.8  PROT 6.7  ALBUMIN 2.4*   CBC:  Recent Labs  09/23/12 0800  WBC 10.1  HGB 12.0*  HCT 34.1*  MCV 90.2  PLT 192   Radiology/Studies:  Dg Chest Port 1 View  09/23/2012   *RADIOLOGY REPORT*  Clinical Data: Short of breath.  History of CHF.  PORTABLE CHEST - 1 VIEW  Comparison: Chest x-ray from yesterday.  Findings: Right subclavian central venous catheter, tip at the superior cavoatrial junction.  Mild cardiomegaly, chronic.  Partial clearing of lower lung opacities, which still persist.  The diaphragm is better seen currently.  Negative for edema.  No pneumothorax.  IMPRESSION:  1.  Improved lower lung aeration.  2. No pulmonary edema.   Original Report Authenticated By: Tiburcio Pea   Dg Chest Port 1 View  09/22/2012   *RADIOLOGY REPORT*  Clinical Data: Cough, assess infiltrates  PORTABLE CHEST - 1 VIEW  Comparison: 09/19/2012  Findings: Mild patchy bilateral lower lobe opacities, suspicious for pneumonia, less likely atelectasis. When compared to prior study, this  is mildly progressed.  No definite pleural effusion.  No pneumothorax.  The heart is normal in size.  Stable right subclavian venous catheter terminating at the cavoatrial junction.  IMPRESSION: Mild patchy bilateral lower lobe opacities, suspicious for pneumonia, less likely atelectasis.   Original Report Authenticated By: Charline Bills, M.D.   Dg Chest Port 1 View  09/19/2012   *RADIOLOGY REPORT*  Clinical Data: Central line placement  PORTABLE CHEST - 1 VIEW  Comparison: 09/19/2012 1700 hours  Findings: Status post placement of right subclavian central line with tip projecting over the cavoatrial junction.  No pneumothorax. Mild bilateral lower lobe infiltrates have developed.  Otherwise unchanged.  IMPRESSION: Central line as described. Development of mild bilateral lower lobe infiltrates.   Original Report Authenticated By: Esperanza Heir, M.D.   Dg Chest Portable 1 View  09/19/2012   *RADIOLOGY REPORT*  Clinical Data: Sepsis.  PORTABLE CHEST - 1 VIEW  Comparison: 12/23/2011  Findings: There is no evidence of pulmonary infiltrate, edema or pleural effusion.  The heart size and mediastinal contours are within normal limits.  No bony abnormalities are seen.  IMPRESSION: No active disease.   Original Report Authenticated By: Irish Lack, M.D.    PHYSICAL EXAM General: elderly, well nourished, in no acute distress. Head:  Normal Neck: Negative for carotid bruits. JVD not elevated. Lungs: Clear Heart: IRRR S1 S2 without murmurs, rubs, or gallops.  Abdomen: Soft, non-tender, non-distended with normoactive bowel sounds. No hepatomegaly. No rebound/guarding.  Msk:  Strength and tone appears normal for age. Extremities: No clubbing, cyanosis or edema.  Distal pedal pulses are 2+ and equal bilaterally. Neuro: Alert, confused Psych:  Responds to questions appropriately with a flat affect.  ASSESSMENT AND PLAN: 1. Atrial flutter- chronic. HR increased secondary to recent stressors with PNA/  sepsis.Currently rate well controlled on metoprolol alone. Dig DC'd due to history of bradycardia. Will decrease metoprolol back to pre admission dose of 25 mg bid. OK for DC to nursing facility today. 2. Septic shock/PNA 3. Ischemic cardiomyopathy. No overt CHF.  Principal Problem:   Septic shock(785.52) Active Problems:   Atrial flutter   Ischemic cardiomyopathy   HTN (hypertension)   Hypothyroidism   Healthcare-associated pneumonia   Chronic systolic congestive heart failure, NYHA class 3   Dehydration   Metabolic encephalopathy   COPD, mild   Thrombocytopenia   Warfarin anticoagulation   Acute respiratory failure with hypoxia   Hypokalemia    Signed, Keigo Whalley [place] MD,FACC 09/25/2012 8:43 AM

## 2012-09-25 NOTE — Progress Notes (Signed)
Patient's condom catheter was dislodged during ambulation to bathroom with sitter.  Patient agreed to let staff know when he has to use the bathroom so that it does not have to be replaced.  Patient is resting comfortably in bed and states no additional needs at this time.  Discharge anticipated later this afternoon.

## 2012-10-01 ENCOUNTER — Inpatient Hospital Stay: Payer: PRIVATE HEALTH INSURANCE | Admitting: Adult Health

## 2012-10-02 ENCOUNTER — Ambulatory Visit (INDEPENDENT_AMBULATORY_CARE_PROVIDER_SITE_OTHER): Payer: PRIVATE HEALTH INSURANCE | Admitting: Adult Health

## 2012-10-02 ENCOUNTER — Encounter: Payer: Self-pay | Admitting: Adult Health

## 2012-10-02 ENCOUNTER — Ambulatory Visit (INDEPENDENT_AMBULATORY_CARE_PROVIDER_SITE_OTHER)
Admission: RE | Admit: 2012-10-02 | Discharge: 2012-10-02 | Disposition: A | Discharge status: Home / Self Care | Payer: PRIVATE HEALTH INSURANCE | Source: Ambulatory Visit | Attending: Adult Health | Admitting: Adult Health

## 2012-10-02 ENCOUNTER — Ambulatory Visit (INDEPENDENT_AMBULATORY_CARE_PROVIDER_SITE_OTHER): Payer: PRIVATE HEALTH INSURANCE | Admitting: *Deleted

## 2012-10-02 VITALS — BP 130/84 | HR 69 | Temp 97.2°F | Ht 68.5 in | Wt 132.0 lb

## 2012-10-02 DIAGNOSIS — I4892 Unspecified atrial flutter: Secondary | ICD-10-CM

## 2012-10-02 DIAGNOSIS — J189 Pneumonia, unspecified organism: Secondary | ICD-10-CM

## 2012-10-02 LAB — POCT INR: INR: 2

## 2012-10-02 NOTE — Progress Notes (Signed)
  Subjective:    Patient ID: David [name], male    DOB: 1940/11/20, 72 y.o.   MRN: [id number]  HPI 72 yo former smoker NH rresident with past medical history of CHF (EF 20%), AF (on Coumadin) and COPD brought to ED with diarrhea, cough, fever and acute encephalopathy. Admitted with septic shock 09/19/12   10/02/2012 Post Hospital follow up  Pt returns for post hospital follow up .  Patient was admitted August 30 through September 5, for septic shock, acute respiratory failure, with associated healthcare associated known pneumonia, versus tracheobronchitis. He did require initial pressors along with IV fluid resusuciatation. He was tx with IV antibiotics, cx were neg. He was transitioned to suprax and clindamycin prior to discharge. Could not tolerate flurquinolone d/t prolonged QT interval.  He has a history of atrial flutter, along with ischemic cardiomyopathy with chronic systolic CHF  He was seen by cardiology and continued on his NH regimen, including beta blocker,  diuretics, and Coumadin prior to discharge. Since discharge. Patient is feeling improved. He denies any chest pain, orthopnea, PND, or leg swelling Chest x-ray today shows improved aeration at the left lung base, with some residual residual densities along the lung bases, right greater than left.  Has few days left on abx.  No n/v/d .    Review of Systems Constitutional:   No  weight loss, night sweats,  Fevers, chills,  +fatigue, or  lassitude.  HEENT:   No headaches,  Difficulty swallowing,  Tooth/dental problems, or  Sore throat,                No sneezing, itching, ear ache,  +nasal congestion, post nasal drip,   CV:  No chest pain,  Orthopnea, PND, swelling in lower extremities, anasarca, dizziness, palpitations, syncope.   GI  No heartburn, indigestion, abdominal pain, nausea, vomiting, diarrhea, change in bowel habits, loss of appetite, bloody stools.   Resp: no coughing up of blood.  No change in color of mucus.   No wheezing.  No chest wall deformity  Skin: no rash or lesions.  GU: no dysuria, change in color of urine, no urgency or frequency.  No flank pain, no hematuria   MS:  No decreased range of motion.  No back pain.  Psych:  No change in mood or affect.          Objective:   Physical Exam GEN: A/Ox3; pleasant , NAD, eldelry   HEENT:  [place]/AT,  EACs-clear, TMs-wnl, NOSE-clear, THROAT-clear, no lesions, no postnasal drip or exudate noted.   NECK:  Supple w/ fair ROM; no JVD; normal carotid impulses w/o bruits; no thyromegaly or nodules palpated; no lymphadenopathy.  RESP  Few rhonchi , no accessory muscle use, no dullness to percussion  CARD:  RRR, no m/r/g  , no peripheral edema, pulses intact, no cyanosis or clubbing.  GI:   Soft & nt; nml bowel sounds; no organomegaly or masses detected.  Musco: Warm bil, no deformities or joint swelling noted.   Neuro: alert, no focal deficits noted.    Skin: Warm, no lesions or rashes         Assessment & Plan:

## 2012-10-02 NOTE — Assessment & Plan Note (Signed)
Clinically improved  CXR improved with resolving infiltrates   Plan  Finish ABX as directed.  follow up in 4 weeks with cxr .

## 2012-10-02 NOTE — Patient Instructions (Addendum)
Finish Suprax and Clindamycin as directed  Follow up Dr. Craige Cotta  In 4 weeks with follow up chest xray

## 2012-10-02 NOTE — Progress Notes (Signed)
Reviewed and agree with assessment/plan. 

## 2012-10-16 ENCOUNTER — Ambulatory Visit (INDEPENDENT_AMBULATORY_CARE_PROVIDER_SITE_OTHER): Payer: PRIVATE HEALTH INSURANCE | Admitting: Pharmacist

## 2012-10-16 DIAGNOSIS — I4892 Unspecified atrial flutter: Secondary | ICD-10-CM

## 2012-10-16 LAB — POCT INR: INR: 2.8

## 2012-11-06 ENCOUNTER — Ambulatory Visit (INDEPENDENT_AMBULATORY_CARE_PROVIDER_SITE_OTHER): Payer: PRIVATE HEALTH INSURANCE | Admitting: Pulmonary Disease

## 2012-11-06 ENCOUNTER — Ambulatory Visit (INDEPENDENT_AMBULATORY_CARE_PROVIDER_SITE_OTHER): Payer: Medicaid Other | Admitting: *Deleted

## 2012-11-06 ENCOUNTER — Encounter: Payer: Self-pay | Admitting: Pulmonary Disease

## 2012-11-06 ENCOUNTER — Ambulatory Visit (INDEPENDENT_AMBULATORY_CARE_PROVIDER_SITE_OTHER)
Admission: RE | Admit: 2012-11-06 | Discharge: 2012-11-06 | Disposition: A | Discharge status: Home / Self Care | Payer: PRIVATE HEALTH INSURANCE | Source: Ambulatory Visit | Attending: Pulmonary Disease | Admitting: Pulmonary Disease

## 2012-11-06 VITALS — BP 100/66 | HR 70 | Ht 72.0 in | Wt 136.0 lb

## 2012-11-06 DIAGNOSIS — Z720 Tobacco use: Secondary | ICD-10-CM

## 2012-11-06 DIAGNOSIS — J449 Chronic obstructive pulmonary disease, unspecified: Secondary | ICD-10-CM

## 2012-11-06 DIAGNOSIS — F17200 Nicotine dependence, unspecified, uncomplicated: Secondary | ICD-10-CM

## 2012-11-06 DIAGNOSIS — J189 Pneumonia, unspecified organism: Secondary | ICD-10-CM

## 2012-11-06 DIAGNOSIS — I4892 Unspecified atrial flutter: Secondary | ICD-10-CM

## 2012-11-06 DIAGNOSIS — J4489 Other specified chronic obstructive pulmonary disease: Secondary | ICD-10-CM

## 2012-11-06 DIAGNOSIS — J439 Emphysema, unspecified: Secondary | ICD-10-CM

## 2012-11-06 DIAGNOSIS — J438 Other emphysema: Secondary | ICD-10-CM

## 2012-11-06 LAB — POCT INR: INR: 4.9

## 2012-11-06 MED ORDER — TIOTROPIUM BROMIDE MONOHYDRATE 18 MCG IN CAPS: 18.0000 ug | ORAL_CAPSULE | Freq: Every day | RESPIRATORY_TRACT | Status: DC

## 2012-11-06 NOTE — Patient Instructions (Signed)
Spiriva one puff daily Stop Qvar Albuterol two puffs up to four times per day as needed for cough, wheeze, or chest congestion Follow up in 3 months with chest xray

## 2012-11-06 NOTE — Assessment & Plan Note (Signed)
He has severe obstruction on spirometry today.  I am not sure ICS alone is best option for him, especially in setting of recent pneumonia.  Will change him to spiriva one puff daily, and continue albuterol prn.  He is to stop Qvar.  Will need to discuss option of pulmonary rehab at his next visit.

## 2012-11-06 NOTE — Assessment & Plan Note (Signed)
Discussed importance of smoking cessation. 

## 2012-11-06 NOTE — Assessment & Plan Note (Signed)
Has radiographic improvement, but not completely resolved infiltrate on CXR.  Will repeat CXR at next visit.

## 2012-11-06 NOTE — Progress Notes (Signed)
Chief Complaint  Patient presents with  . Follow-up    Breathing is doing well. Denies having any acute problems.    History of Present Illness: David [name] is a 72 y.o. male smoker with COPD/emphysema.    He was in hospital in August 2014 for pneumonia with septic shock.  He has history of systolic heart failure.  He still has cough with clear sputum.  He denies chest pain, wheeze, fever, sweats, or hemoptysis.  He has not noticed weight loss.  He uses Qvar and albuterol twice per day, and these help.  He denies difficulty with his swallowing.  CXR report from 11/02/12 shows subsegmental atelectasis versus infiltrate in left base.  TESTS: Echo 09/20/12 >> EF 40 to 45%, mild MR Spirometry 11/06/12 >> FEV1 0.68 (22%), FEV1% 42%  David [name]  has a past medical history of Community acquired pneumonia; Toxic metabolic encephalopathy; Chronic atrial flutter; Tobacco abuse; History of coronary artery disease (2008); Ischemic cardiomyopathy; History of prostate cancer; Chronic obstructive pulmonary disease; Hypertension; History of alcohol abuse; History of depression; Altered mental status (2012 ); Noncompliance; Depression; Ulcer; migraines; Hyperlipidemia; and Colon cancer.  David [name]  has past surgical history that includes Appendectomy.  Prior to Admission medications   Medication Sig Start Date End Date Taking? Authorizing Provider  albuterol (PROVENTIL HFA;VENTOLIN HFA) 108 (90 BASE) MCG/ACT inhaler Inhale 2 puffs into the lungs every 6 (six) hours as needed.   Yes Historical Provider, MD  cefixime (SUPRAX) 400 MG tablet Take 1 tablet (400 mg total) by mouth daily. 09/25/12  Yes Marinda Elk, MD  DULoxetine (CYMBALTA) 60 MG capsule Take 60 mg by mouth daily.   Yes Historical Provider, MD  folic acid (FOLVITE) 1 MG tablet Take 1 tablet (1 mg total) by mouth daily. 12/31/11  Yes Marianne L York, PA-C  guaiFENesin (MUCINEX) 600 MG 12 hr tablet Take 1 tablet (600 mg total) by  mouth 2 (two) times daily. 12/31/11  Yes Marianne L York, PA-C  irbesartan (AVAPRO) 75 MG tablet Take 1 tablet (75 mg total) by mouth daily. 12/31/11  Yes Marianne L York, PA-C  lactulose (CHRONULAC) 10 GM/15ML solution Take 30 mLs (20 g total) by mouth daily. 12/31/11  Yes Marianne L York, PA-C  loratadine (CLARITIN) 10 MG tablet Take 10 mg by mouth daily.   Yes Historical Provider, MD  meloxicam (MOBIC) 7.5 MG tablet Take 7.5 mg by mouth daily.   Yes Historical Provider, MD  metoprolol tartrate (LOPRESSOR) 25 MG tablet Take 1 tablet (25 mg total) by mouth 2 (two) times daily. 12/31/11  Yes Marianne L York, PA-C  mirtazapine (REMERON) 15 MG tablet Take 15 mg by mouth at bedtime.   Yes Historical Provider, MD  Multiple Vitamin (MULTIVITAMIN) tablet Take 1 tablet by mouth daily.   Yes Historical Provider, MD  paliperidone (INVEGA) 6 MG 24 hr tablet Take 6 mg by mouth every morning.   Yes Historical Provider, MD  QUEtiapine (SEROQUEL) 50 MG tablet Take 50 mg by mouth 2 (two) times daily.   Yes Historical Provider, MD  QVAR 40 MCG/ACT inhaler Inhale 2 puffs into the lungs 2 (two) times daily.  01/02/12  Yes Historical Provider, MD  rosuvastatin (CRESTOR) 20 MG tablet Take 20 mg by mouth daily.   Yes Historical Provider, MD  Sennosides-Docusate Sodium (SENNA PLUS PO) Take 1 tablet by mouth 2 (two) times daily as needed (constipation).    Yes Historical Provider, MD  thiamine (VITAMIN B-1) 100 MG tablet Take 100 mg  by mouth daily.   Yes Historical Provider, MD  topiramate (TOPAMAX) 25 MG tablet Take 25 mg by mouth at bedtime.    Yes Historical Provider, MD  warfarin (COUMADIN) 5 MG tablet Take 1 tablet (5 mg total) by mouth daily. 12/31/11  Yes Marianne L York, PA-C  warfarin (COUMADIN) 7.5 MG tablet Take 7.5 mg by mouth as directed. Takes 7.5 mg on thurs and sundays then 5 mg the rest of the week   Yes Historical Provider, MD    Allergies  Allergen Reactions  . Oat Flour [Oat]      Physical  Exam:  General - No distress, thin ENT - No sinus tenderness, no oral exudate, no LAN, poor dentition Cardiac - s1s2 regular, no murmur Chest - Decreased breath sounds, prolonged exhalation, no wheeze Back - No focal tenderness Abd - Soft, non-tender Ext - No edema Neuro - Normal strength Skin - No rashes Psych - normal mood, and behavior  Dg Chest 2 View  11/06/2012   CLINICAL DATA:  Pneumonia  EXAM: CHEST  2 VIEW  COMPARISON:  10/02/2012  FINDINGS: Airspace disease at the posterior right lung base has improved. There is some residual disease. No pleural effusion or pneumothorax. Normal heart size. Lungs remain hyperaerated.  IMPRESSION: Improved right lower lobe pneumonia. Continued followup until complete resolution is recommended   Electronically Signed   By: Maryclare Bean M.D.   On: 11/06/2012 15:22    Assessment/Plan:  Coralyn Helling, MD  Pulmonary/Critical Care/Sleep Pager:  [phone]

## 2012-11-16 ENCOUNTER — Ambulatory Visit (INDEPENDENT_AMBULATORY_CARE_PROVIDER_SITE_OTHER): Payer: Medicaid Other | Admitting: General Practice

## 2012-11-16 DIAGNOSIS — I4892 Unspecified atrial flutter: Secondary | ICD-10-CM

## 2012-11-30 ENCOUNTER — Ambulatory Visit (INDEPENDENT_AMBULATORY_CARE_PROVIDER_SITE_OTHER): Payer: Medicaid Other | Admitting: *Deleted

## 2012-11-30 ENCOUNTER — Encounter: Payer: Self-pay | Admitting: Pulmonary Disease

## 2012-11-30 DIAGNOSIS — I4892 Unspecified atrial flutter: Secondary | ICD-10-CM

## 2012-11-30 LAB — POCT INR: INR: 2.5

## 2012-12-23 ENCOUNTER — Encounter: Payer: Self-pay | Admitting: Cardiology

## 2012-12-23 ENCOUNTER — Ambulatory Visit (INDEPENDENT_AMBULATORY_CARE_PROVIDER_SITE_OTHER): Payer: Medicaid Other | Admitting: *Deleted

## 2012-12-23 ENCOUNTER — Ambulatory Visit (INDEPENDENT_AMBULATORY_CARE_PROVIDER_SITE_OTHER): Payer: Medicaid Other | Admitting: Cardiology

## 2012-12-23 VITALS — BP 114/63 | HR 72 | Ht 72.0 in | Wt 141.0 lb

## 2012-12-23 DIAGNOSIS — Z720 Tobacco use: Secondary | ICD-10-CM

## 2012-12-23 DIAGNOSIS — I4892 Unspecified atrial flutter: Secondary | ICD-10-CM

## 2012-12-23 DIAGNOSIS — F17200 Nicotine dependence, unspecified, uncomplicated: Secondary | ICD-10-CM

## 2012-12-23 DIAGNOSIS — I2589 Other forms of chronic ischemic heart disease: Secondary | ICD-10-CM

## 2012-12-23 DIAGNOSIS — I2510 Atherosclerotic heart disease of native coronary artery without angina pectoris: Secondary | ICD-10-CM

## 2012-12-23 DIAGNOSIS — I255 Ischemic cardiomyopathy: Secondary | ICD-10-CM

## 2012-12-23 LAB — POCT INR: INR: 1.5

## 2012-12-23 NOTE — Patient Instructions (Signed)
Continue your current therapy with the exception of the change in coumadin dose.  I will see you in 6 months.

## 2012-12-23 NOTE — Progress Notes (Signed)
David [name] Date of Birth: [birth year] Medical Record #[id number]  History of Present Illness: David [name] is seen for follow up.  He  has a history of ischemic cardiomyopathy with ejection fraction of 20-30%. Prior cardiac catheterization demonstrated occlusion of the right coronary with moderate disease in an intermediate branch. He also has chronic atrial flutter. He had cardioversion in 2008 but has since had recurrence. He has ongoing tobacco use. He is on chronic anticoagulation and has been followed in our Coumadin clinic. He was hospitalized in September with pneumonia and sepsis associated with encephalopathy. He had increased ventricular response to his atrial flutter. He was initially digitalized but this was later stopped because of history of bradycardia on this medication. He is now back on his usual dose of metoprolol. The patient is a very poor historian but denies any complaints today.  Current Outpatient Prescriptions on File Prior to Visit  Medication Sig Dispense Refill  . albuterol (PROVENTIL HFA;VENTOLIN HFA) 108 (90 BASE) MCG/ACT inhaler Inhale 2 puffs into the lungs every 6 (six) hours as needed.      . DULoxetine (CYMBALTA) 60 MG capsule Take 60 mg by mouth daily.      . folic acid (FOLVITE) 1 MG tablet Take 1 tablet (1 mg total) by mouth daily.  30 tablet  1  . guaiFENesin (MUCINEX) 600 MG 12 hr tablet Take 1 tablet (600 mg total) by mouth 2 (two) times daily.      . irbesartan (AVAPRO) 75 MG tablet Take 1 tablet (75 mg total) by mouth daily.  30 tablet  1  . lactulose (CHRONULAC) 10 GM/15ML solution Take 30 mLs (20 g total) by mouth daily.  240 mL  0  . meloxicam (MOBIC) 7.5 MG tablet Take 7.5 mg by mouth daily.      . metoprolol tartrate (LOPRESSOR) 25 MG tablet Take 1 tablet (25 mg total) by mouth 2 (two) times daily.  60 tablet  0  . mirtazapine (REMERON) 15 MG tablet Take 15 mg by mouth at bedtime.      . Multiple Vitamin (MULTIVITAMIN) tablet Take 1 tablet by  mouth daily.      . paliperidone (INVEGA) 6 MG 24 hr tablet Take 6 mg by mouth every morning.      Marland Kitchen QUEtiapine (SEROQUEL) 50 MG tablet Take 50 mg by mouth 2 (two) times daily.      . rosuvastatin (CRESTOR) 20 MG tablet Take 20 mg by mouth daily.      Bernadette Hoit Sodium (SENNA PLUS PO) Take 1 tablet by mouth 2 (two) times daily as needed (constipation).       . thiamine (VITAMIN B-1) 100 MG tablet Take 100 mg by mouth daily.      Marland Kitchen tiotropium (SPIRIVA) 18 MCG inhalation capsule Place 1 capsule (18 mcg total) into inhaler and inhale daily.  30 capsule  5  . topiramate (TOPAMAX) 25 MG tablet Take 25 mg by mouth at bedtime.       Marland Kitchen warfarin (COUMADIN) 5 MG tablet Take 1 tablet (5 mg total) by mouth daily.      Marland Kitchen warfarin (COUMADIN) 7.5 MG tablet Take 7.5 mg by mouth as directed. Takes 7.5 mg on thurs and sundays then 5 mg the rest of the week       No current facility-administered medications on file prior to visit.    Allergies  Allergen Reactions  . Oat Flour [Oat]     Past Medical History  Diagnosis Date  .  Community acquired pneumonia     Right middle lobe community-acquired pneumonia  . Toxic metabolic encephalopathy   . Chronic atrial flutter     a. rate controlled with bb therapy;  b. h/o bradycardia when on digoxin 12/2011.  . Tobacco abuse   . History of coronary artery disease 2008    a. Cath at [facility]: RCA 95%, RI 60%  - medical therapy  . Ischemic cardiomyopathy     a. prev documented EF of 20-30%;  b. 09/2012 Echo: EF 40-45%, diff HK, mild MR, mild bi-atrial enlargement.  . History of prostate cancer   . Chronic obstructive pulmonary disease     with mild bronchospasm, improved  . Hypertension   . History of alcohol abuse     History of prior heavy alcohol abuse  . History of depression   . Altered mental status 2012   . Noncompliance   . Depression   . Ulcer   . Hx of migraines   . Hyperlipidemia   . Colon cancer     Past Surgical History   Procedure Laterality Date  . Appendectomy      History  Smoking status  . Current Every Day Smoker -- 0.50 packs/day for 40 years  . Types: Cigarettes  Smokeless tobacco  . Never Used    History  Alcohol Use  . Yes    Family History  Problem Relation Age of Onset  . Cancer Father     Prostate Cancer  . Heart disease Other     Parent  . Alcohol abuse Other     Parent  . Arthritis Other     parent  . Hypertension Other     Parent    Review of Systems: As noted in history of present illness. All other systems were reviewed and are negative.  Physical Exam: BP 114/63  Pulse 72  Ht 6' (1.829 m)  Wt 141 lb (63.957 kg)  BMI 19.12 kg/m2 He is a frail black male in no acute distress. He is normocephalic, atraumatic. Pupils are equal round and reactive to light and accommodation. His mucous membranes are moist. Dentition is in poor repair. Neck is supple no JVD, adenopathy, thyromegaly, or bruits. Cardiac exam reveals an irregular rate and rhythm with a grade 2/6 holosystolic murmur at the apex. There is no S3. Abdomen is soft and nontender without masses or ascites. Extremities are without edema. He has poor pedal pulses. Skin is dry. He is alert and oriented x3. His mood is appropriate. Cranial nerves II through XII are intact.  LABORATORY DATA:  INR today is 1.5.  Assessment / Plan: 1. Atrial flutter. Rate is well controlled today. Continue metoprolol only. History of severe bradycardia on digoxin.  2. Anticoagulation. INR is subtherapeutic. Dose adjusted per the Coumadin clinic.  3. Tobacco abuse. Reports smoking 5-6 cigarettes per day. Encouraged cessation.  4. 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 Patient is asymptomatic.  5. Chronic systolic congestive heart failure with ejection fraction of 20-25%. Blood pressure is too low to consider ACE inhibitor or ARB. He is euvolemic.

## 2013-01-08 ENCOUNTER — Ambulatory Visit (INDEPENDENT_AMBULATORY_CARE_PROVIDER_SITE_OTHER): Payer: Medicaid Other

## 2013-01-08 DIAGNOSIS — I4892 Unspecified atrial flutter: Secondary | ICD-10-CM

## 2013-01-23 ENCOUNTER — Encounter (HOSPITAL_COMMUNITY): Payer: Self-pay | Admitting: Emergency Medicine

## 2013-01-23 ENCOUNTER — Emergency Department (HOSPITAL_COMMUNITY): Payer: PRIVATE HEALTH INSURANCE

## 2013-01-23 ENCOUNTER — Inpatient Hospital Stay (HOSPITAL_COMMUNITY)
Admission: EM | Admit: 2013-01-23 | Discharge: 2013-01-27 | DRG: 193 | Disposition: A | Discharge status: Nursing Home (Includes ICF) | Payer: PRIVATE HEALTH INSURANCE | Attending: Family Medicine | Admitting: Family Medicine

## 2013-01-23 DIAGNOSIS — A539 Syphilis, unspecified: Secondary | ICD-10-CM

## 2013-01-23 DIAGNOSIS — J449 Chronic obstructive pulmonary disease, unspecified: Secondary | ICD-10-CM | POA: Diagnosis present

## 2013-01-23 DIAGNOSIS — Z8546 Personal history of malignant neoplasm of prostate: Secondary | ICD-10-CM

## 2013-01-23 DIAGNOSIS — D696 Thrombocytopenia, unspecified: Secondary | ICD-10-CM

## 2013-01-23 DIAGNOSIS — D72829 Elevated white blood cell count, unspecified: Secondary | ICD-10-CM | POA: Diagnosis present

## 2013-01-23 DIAGNOSIS — Z681 Body mass index (BMI) 19 or less, adult: Secondary | ICD-10-CM

## 2013-01-23 DIAGNOSIS — J189 Pneumonia, unspecified organism: Principal | ICD-10-CM

## 2013-01-23 DIAGNOSIS — Z85038 Personal history of other malignant neoplasm of large intestine: Secondary | ICD-10-CM

## 2013-01-23 DIAGNOSIS — E785 Hyperlipidemia, unspecified: Secondary | ICD-10-CM | POA: Diagnosis present

## 2013-01-23 DIAGNOSIS — Z79899 Other long term (current) drug therapy: Secondary | ICD-10-CM

## 2013-01-23 DIAGNOSIS — G929 Unspecified toxic encephalopathy: Secondary | ICD-10-CM | POA: Diagnosis present

## 2013-01-23 DIAGNOSIS — I509 Heart failure, unspecified: Secondary | ICD-10-CM | POA: Diagnosis present

## 2013-01-23 DIAGNOSIS — G92 Toxic encephalopathy: Secondary | ICD-10-CM | POA: Diagnosis present

## 2013-01-23 DIAGNOSIS — Z720 Tobacco use: Secondary | ICD-10-CM

## 2013-01-23 DIAGNOSIS — F17200 Nicotine dependence, unspecified, uncomplicated: Secondary | ICD-10-CM | POA: Diagnosis present

## 2013-01-23 DIAGNOSIS — Z7901 Long term (current) use of anticoagulants: Secondary | ICD-10-CM

## 2013-01-23 DIAGNOSIS — G9341 Metabolic encephalopathy: Secondary | ICD-10-CM | POA: Diagnosis present

## 2013-01-23 DIAGNOSIS — E43 Unspecified severe protein-calorie malnutrition: Secondary | ICD-10-CM

## 2013-01-23 DIAGNOSIS — Z8042 Family history of malignant neoplasm of prostate: Secondary | ICD-10-CM

## 2013-01-23 DIAGNOSIS — J4489 Other specified chronic obstructive pulmonary disease: Secondary | ICD-10-CM | POA: Diagnosis present

## 2013-01-23 DIAGNOSIS — I2510 Atherosclerotic heart disease of native coronary artery without angina pectoris: Secondary | ICD-10-CM

## 2013-01-23 DIAGNOSIS — F3289 Other specified depressive episodes: Secondary | ICD-10-CM | POA: Diagnosis present

## 2013-01-23 DIAGNOSIS — K559 Vascular disorder of intestine, unspecified: Secondary | ICD-10-CM | POA: Diagnosis present

## 2013-01-23 DIAGNOSIS — I10 Essential (primary) hypertension: Secondary | ICD-10-CM

## 2013-01-23 DIAGNOSIS — I4892 Unspecified atrial flutter: Secondary | ICD-10-CM

## 2013-01-23 DIAGNOSIS — I959 Hypotension, unspecified: Secondary | ICD-10-CM | POA: Diagnosis present

## 2013-01-23 DIAGNOSIS — Z9119 Patient's noncompliance with other medical treatment and regimen: Secondary | ICD-10-CM

## 2013-01-23 DIAGNOSIS — I5022 Chronic systolic (congestive) heart failure: Secondary | ICD-10-CM

## 2013-01-23 DIAGNOSIS — I2589 Other forms of chronic ischemic heart disease: Secondary | ICD-10-CM | POA: Diagnosis present

## 2013-01-23 DIAGNOSIS — E039 Hypothyroidism, unspecified: Secondary | ICD-10-CM

## 2013-01-23 DIAGNOSIS — F329 Major depressive disorder, single episode, unspecified: Secondary | ICD-10-CM | POA: Diagnosis present

## 2013-01-23 DIAGNOSIS — J9600 Acute respiratory failure, unspecified whether with hypoxia or hypercapnia: Secondary | ICD-10-CM

## 2013-01-23 DIAGNOSIS — I5023 Acute on chronic systolic (congestive) heart failure: Secondary | ICD-10-CM

## 2013-01-23 DIAGNOSIS — I255 Ischemic cardiomyopathy: Secondary | ICD-10-CM

## 2013-01-23 DIAGNOSIS — J439 Emphysema, unspecified: Secondary | ICD-10-CM

## 2013-01-23 DIAGNOSIS — Z91199 Patient's noncompliance with other medical treatment and regimen due to unspecified reason: Secondary | ICD-10-CM

## 2013-01-23 LAB — COMPREHENSIVE METABOLIC PANEL
ALK PHOS: 63 U/L (ref 39–117)
ALT: 16 U/L (ref 0–53)
AST: 21 U/L (ref 0–37)
Albumin: 3.5 g/dL (ref 3.5–5.2)
BUN: 12 mg/dL (ref 6–23)
CALCIUM: 9.5 mg/dL (ref 8.4–10.5)
CO2: 23 mEq/L (ref 19–32)
Chloride: 103 mEq/L (ref 96–112)
Creatinine, Ser: 0.71 mg/dL (ref 0.50–1.35)
GFR calc non Af Amer: >90 mL/min (ref >90)
GLUCOSE: 124 mg/dL — AB (ref 70–99)
Potassium: 4.4 mEq/L (ref 3.7–5.3)
Sodium: 140 mEq/L (ref 137–147)
TOTAL PROTEIN: 7.4 g/dL (ref 6.0–8.3)
Total Bilirubin: 0.7 mg/dL (ref 0.3–1.2)

## 2013-01-23 LAB — URINALYSIS, ROUTINE W REFLEX MICROSCOPIC
BILIRUBIN URINE: NEGATIVE
Glucose, UA: NEGATIVE mg/dL
Hgb urine dipstick: NEGATIVE
Ketones, ur: NEGATIVE mg/dL
Leukocytes, UA: NEGATIVE
NITRITE: NEGATIVE
Protein, ur: NEGATIVE mg/dL
Specific Gravity, Urine: 1.019 (ref 1.005–1.030)
UROBILINOGEN UA: 1 mg/dL (ref 0.0–1.0)
pH: 8 (ref 5.0–8.0)

## 2013-01-23 LAB — LIPASE, BLOOD: LIPASE: 9 U/L — AB (ref 11–59)

## 2013-01-23 LAB — CBC WITH DIFFERENTIAL/PLATELET
Basophils Absolute: 0 10*3/uL (ref 0.0–0.1)
Basophils Relative: 0 % (ref 0–1)
EOS ABS: 0 10*3/uL (ref 0.0–0.7)
EOS PCT: 0 % (ref 0–5)
HEMATOCRIT: 36.6 % — AB (ref 39.0–52.0)
HEMOGLOBIN: 12.4 g/dL — AB (ref 13.0–17.0)
LYMPHS ABS: 1.7 10*3/uL (ref 0.7–4.0)
LYMPHS PCT: 13 % (ref 12–46)
MCH: 31.8 pg (ref 26.0–34.0)
MCHC: 33.9 g/dL (ref 30.0–36.0)
MCV: 93.8 fL (ref 78.0–100.0)
MONO ABS: 1.8 10*3/uL — AB (ref 0.1–1.0)
MONOS PCT: 14 % — AB (ref 3–12)
Neutro Abs: 9.4 10*3/uL — ABNORMAL HIGH (ref 1.7–7.7)
Neutrophils Relative %: 73 % (ref 43–77)
Platelets: 195 10*3/uL (ref 150–400)
RBC: 3.9 MIL/uL — AB (ref 4.22–5.81)
RDW: 13.2 % (ref 11.5–15.5)
WBC: 12.9 10*3/uL — AB (ref 4.0–10.5)

## 2013-01-23 LAB — INFLUENZA PANEL BY PCR (TYPE A & B)
H1N1 flu by pcr: NOT DETECTED
Influenza A By PCR: NEGATIVE
Influenza B By PCR: NEGATIVE

## 2013-01-23 LAB — PRO B NATRIURETIC PEPTIDE: PRO B NATRI PEPTIDE: 1890 pg/mL — AB (ref 0–125)

## 2013-01-23 LAB — MRSA PCR SCREENING: MRSA BY PCR: NEGATIVE

## 2013-01-23 LAB — CG4 I-STAT (LACTIC ACID): LACTIC ACID, VENOUS: 1.7 mmol/L (ref 0.5–2.2)

## 2013-01-23 LAB — PROTIME-INR
INR: 1.96 — ABNORMAL HIGH (ref 0.00–1.49)
PROTHROMBIN TIME: 21.7 s — AB (ref 11.6–15.2)

## 2013-01-23 MED ORDER — ONDANSETRON HCL 4 MG PO TABS: 4.0000 mg | ORAL_TABLET | Freq: Four times a day (QID) | ORAL | Status: DC | PRN

## 2013-01-23 MED ORDER — SODIUM CHLORIDE 0.9 % IV BOLUS (SEPSIS)
1000.0000 mL | Freq: Once | INTRAVENOUS | Status: AC
  Administered 2013-01-23: 1000 mL via INTRAVENOUS

## 2013-01-23 MED ORDER — TOPIRAMATE 25 MG PO TABS
25.0000 mg | ORAL_TABLET | Freq: Every day | ORAL | Status: DC
  Administered 2013-01-23 – 2013-01-26 (×4): 25 mg via ORAL
  Filled 2013-01-23 (×5): qty 1

## 2013-01-23 MED ORDER — METOPROLOL TARTRATE 25 MG PO TABS
25.0000 mg | ORAL_TABLET | Freq: Two times a day (BID) | ORAL | Status: DC
  Administered 2013-01-23: 25 mg via ORAL
  Filled 2013-01-23 (×3): qty 1

## 2013-01-23 MED ORDER — VITAMIN B-1 100 MG PO TABS
100.0000 mg | ORAL_TABLET | Freq: Every day | ORAL | Status: DC
  Administered 2013-01-24 – 2013-01-27 (×4): 100 mg via ORAL
  Filled 2013-01-23 (×5): qty 1

## 2013-01-23 MED ORDER — DEXTROSE 5 % IV SOLN
1.0000 g | Freq: Three times a day (TID) | INTRAVENOUS | Status: DC
  Administered 2013-01-23 – 2013-01-26 (×9): 1 g via INTRAVENOUS
  Filled 2013-01-23 (×10): qty 1

## 2013-01-23 MED ORDER — MORPHINE SULFATE 2 MG/ML IJ SOLN
2.0000 mg | INTRAMUSCULAR | Status: DC | PRN
  Administered 2013-01-23 – 2013-01-25 (×2): 2 mg via INTRAVENOUS
  Filled 2013-01-23 (×2): qty 1

## 2013-01-23 MED ORDER — BIOTENE DRY MOUTH MT LIQD
15.0000 mL | Freq: Two times a day (BID) | OROMUCOSAL | Status: DC
  Administered 2013-01-24 – 2013-01-27 (×7): 15 mL via OROMUCOSAL

## 2013-01-23 MED ORDER — SODIUM CHLORIDE 0.9 % IJ SOLN
3.0000 mL | Freq: Two times a day (BID) | INTRAMUSCULAR | Status: DC
  Administered 2013-01-23: 3 mL via INTRAVENOUS
  Administered 2013-01-25: 09:00:00 via INTRAVENOUS
  Administered 2013-01-25: 3 mL via INTRAVENOUS

## 2013-01-23 MED ORDER — MIRTAZAPINE 15 MG PO TABS
15.0000 mg | ORAL_TABLET | Freq: Every day | ORAL | Status: DC
  Administered 2013-01-23 – 2013-01-26 (×4): 15 mg via ORAL
  Filled 2013-01-23 (×5): qty 1

## 2013-01-23 MED ORDER — ONDANSETRON HCL 4 MG/2ML IJ SOLN
4.0000 mg | Freq: Four times a day (QID) | INTRAMUSCULAR | Status: DC | PRN
Start: 2013-01-23 — End: 2013-01-27

## 2013-01-23 MED ORDER — ATORVASTATIN CALCIUM 40 MG PO TABS
40.0000 mg | ORAL_TABLET | Freq: Every day | ORAL | Status: DC
  Administered 2013-01-23 – 2013-01-27 (×5): 40 mg via ORAL
  Filled 2013-01-23 (×5): qty 1

## 2013-01-23 MED ORDER — FUROSEMIDE 10 MG/ML IJ SOLN
20.0000 mg | Freq: Two times a day (BID) | INTRAMUSCULAR | Status: DC
  Administered 2013-01-23 – 2013-01-24 (×2): 20 mg via INTRAVENOUS
  Filled 2013-01-23 (×3): qty 2

## 2013-01-23 MED ORDER — SODIUM CHLORIDE 0.9 % IV SOLN
1000.0000 mL | INTRAVENOUS | Status: DC
  Administered 2013-01-23: 1000 mL via INTRAVENOUS

## 2013-01-23 MED ORDER — DEXTROSE 5 % IV SOLN
2.0000 g | Freq: Once | INTRAVENOUS | Status: AC
  Administered 2013-01-23: 2 g via INTRAVENOUS
  Filled 2013-01-23: qty 2

## 2013-01-23 MED ORDER — SODIUM CHLORIDE 0.9 % IV SOLN: 1000.0000 mL | INTRAVENOUS | Status: DC

## 2013-01-23 MED ORDER — SODIUM CHLORIDE 0.9 % IV SOLN
1000.0000 mL | Freq: Once | INTRAVENOUS | Status: AC
  Administered 2013-01-23: 1000 mL via INTRAVENOUS

## 2013-01-23 MED ORDER — WARFARIN - PHARMACIST DOSING INPATIENT: Freq: Every day | Status: DC

## 2013-01-23 MED ORDER — ACETAMINOPHEN 325 MG PO TABS: 650.0000 mg | ORAL_TABLET | Freq: Four times a day (QID) | ORAL | Status: DC | PRN

## 2013-01-23 MED ORDER — CHLORHEXIDINE GLUCONATE 0.12 % MT SOLN
15.0000 mL | Freq: Two times a day (BID) | OROMUCOSAL | Status: DC
  Administered 2013-01-23 – 2013-01-27 (×8): 15 mL via OROMUCOSAL
  Filled 2013-01-23 (×9): qty 15

## 2013-01-23 MED ORDER — VANCOMYCIN HCL 500 MG IV SOLR
500.0000 mg | Freq: Two times a day (BID) | INTRAVENOUS | Status: DC
  Administered 2013-01-24 – 2013-01-26 (×5): 500 mg via INTRAVENOUS
  Filled 2013-01-23 (×7): qty 500

## 2013-01-23 MED ORDER — DULOXETINE HCL 60 MG PO CPEP
60.0000 mg | ORAL_CAPSULE | Freq: Every day | ORAL | Status: DC
  Administered 2013-01-24 – 2013-01-27 (×4): 60 mg via ORAL
  Filled 2013-01-23 (×4): qty 1

## 2013-01-23 MED ORDER — PALIPERIDONE ER 6 MG PO TB24
6.0000 mg | ORAL_TABLET | Freq: Every day | ORAL | Status: DC
  Administered 2013-01-24 – 2013-01-27 (×4): 6 mg via ORAL
  Filled 2013-01-23 (×5): qty 1

## 2013-01-23 MED ORDER — ONE-DAILY MULTI VITAMINS PO TABS: 1.0000 | ORAL_TABLET | Freq: Every day | ORAL | Status: DC

## 2013-01-23 MED ORDER — WARFARIN SODIUM 5 MG PO TABS: 5.0000 mg | ORAL_TABLET | Freq: Every day | ORAL | Status: DC

## 2013-01-23 MED ORDER — IPRATROPIUM BROMIDE 0.02 % IN SOLN
0.5000 mg | Freq: Once | RESPIRATORY_TRACT | Status: AC
  Administered 2013-01-23: 0.5 mg via RESPIRATORY_TRACT
  Filled 2013-01-23: qty 2.5

## 2013-01-23 MED ORDER — SODIUM CHLORIDE 0.9 % IV SOLN
500.0000 mg | Freq: Once | INTRAVENOUS | Status: AC
  Administered 2013-01-23: 500 mg via INTRAVENOUS
  Filled 2013-01-23: qty 500

## 2013-01-23 MED ORDER — ALBUTEROL SULFATE (2.5 MG/3ML) 0.083% IN NEBU
5.0000 mg | INHALATION_SOLUTION | Freq: Once | RESPIRATORY_TRACT | Status: AC
  Administered 2013-01-23: 5 mg via RESPIRATORY_TRACT
  Filled 2013-01-23: qty 6

## 2013-01-23 MED ORDER — WARFARIN SODIUM 7.5 MG PO TABS
7.5000 mg | ORAL_TABLET | Freq: Once | ORAL | Status: AC
  Administered 2013-01-23: 7.5 mg via ORAL
  Filled 2013-01-23: qty 1

## 2013-01-23 MED ORDER — FOLIC ACID 1 MG PO TABS
1.0000 mg | ORAL_TABLET | Freq: Every day | ORAL | Status: DC
  Administered 2013-01-24 – 2013-01-27 (×4): 1 mg via ORAL
  Filled 2013-01-23 (×4): qty 1

## 2013-01-23 MED ORDER — QUETIAPINE FUMARATE 50 MG PO TABS
50.0000 mg | ORAL_TABLET | Freq: Two times a day (BID) | ORAL | Status: DC
  Administered 2013-01-23 – 2013-01-27 (×8): 50 mg via ORAL
  Filled 2013-01-23 (×9): qty 1

## 2013-01-23 MED ORDER — ADULT MULTIVITAMIN W/MINERALS CH
1.0000 | ORAL_TABLET | Freq: Every day | ORAL | Status: DC
  Administered 2013-01-24 – 2013-01-27 (×4): 1 via ORAL
  Filled 2013-01-23 (×4): qty 1

## 2013-01-23 MED ORDER — VANCOMYCIN HCL IN DEXTROSE 1-5 GM/200ML-% IV SOLN
1000.0000 mg | Freq: Once | INTRAVENOUS | Status: AC
  Administered 2013-01-23: 1000 mg via INTRAVENOUS
  Filled 2013-01-23: qty 200

## 2013-01-23 MED ORDER — ACETAMINOPHEN 650 MG RE SUPP: 650.0000 mg | Freq: Four times a day (QID) | RECTAL | Status: DC | PRN

## 2013-01-23 NOTE — Progress Notes (Signed)
ANTICOAGULATION CONSULT NOTE - Follow Up Consult  Pharmacy Consult for warfarin Indication: atrial flutter  Allergies  Allergen Reactions  . Oat Flour [Oat]     Unknown per MAR  . Other     [facility]: unknown reaction per [facility]    Pharmacy has been consulted to dose warfarin for atrial flutter. Please see previous pharmacist note for today for full details  INR today = 1.96   Goal of Therapy:  INR 2-3  Monitor platelets by anticoagulation protocol: Yes   Plan:  - warfarin 7.5mg  x1 tonight at 2000 - daily PT/INR - CBC at least q72h while in patient - monitor for bleeding  Thank you for the consult.  Johny Drilling, PharmD, BCPS Clinical Pharmacist Pager: [phone] Pharmacy: [phone] 01/23/2013 5:55 PM

## 2013-01-23 NOTE — ED Notes (Signed)
Patient in Atrial flutter, vitals stable

## 2013-01-23 NOTE — ED Notes (Signed)
Bed: QD82 Expected date: 01/23/13 Expected time: 10:30 AM Means of arrival: Ambulance Comments: Cough and Shob

## 2013-01-23 NOTE — H&P (Signed)
[facility] History and Physical  David [name] FIE:[id number] DOB: 03/17/1940 DOA: 01/23/2013  Referring physician: Cleatrice Burke, ER PA PCP: Followed by attending physician at his skilled nursing facility  Chief Complaint: Fever and cough  HPI: David [name] is a 73 y.o. male  Past medical history of COPD, a flutter on chronic anticoagulation, ischemic cardiomyopathy with decreased ejection fraction who resides in a skilled nursing facility and brought in after 3 days of increasing weakness plus cough plus reported fever. He was noted to be hypoxic requiring 2 L of oxygen at his baseline is no oxygen. In the emergency room, patient was noted to have a white blood cell count of 12.9, negative flu titer and noted hypoxia. He was started on broad-spectrum antibiotics and hospitalists were called for further evaluation and admission. Patient was placed to step down unit given full CODE STATUS, hypoxia and borderline hypotension.   Review of Systems:  Patient seen after arrival to step down unit. He has combination of dementia and likely underlying psychosis. He mumbles much of his speech and looks to have some acute confusion, likely metabolic encephalopathy from hypoxia. He is unable to really give me any kind of review of systems.  Past Medical History  Diagnosis Date  . Community acquired pneumonia     Right middle lobe community-acquired pneumonia  . Toxic metabolic encephalopathy   . Chronic atrial flutter     a. rate controlled with bb therapy;  b. h/o bradycardia when on digoxin 12/2011.  . Tobacco abuse   . History of coronary artery disease 2008    a. Cath at [facility]: RCA 95%, RI 60%  - medical therapy  . Ischemic cardiomyopathy     a. prev documented EF of 20-30%;  b. 09/2012 Echo: EF 40-45%, diff HK, mild MR, mild bi-atrial enlargement.  . History of prostate cancer   . Chronic obstructive pulmonary disease     with mild bronchospasm, improved  . Hypertension   .  History of alcohol abuse     History of prior heavy alcohol abuse  . History of depression   . Altered mental status 2012   . Noncompliance   . Depression   . Ulcer   . Hx of migraines   . Hyperlipidemia   . Colon cancer    Past Surgical History  Procedure Laterality Date  . Appendectomy     Social History:  reports that he has been smoking Cigarettes.  He has a 20 pack-year smoking history. He has never used smokeless tobacco. He reports that he does not drink alcohol or use illicit drugs. unclear as to his baseline ambulation status. Patient resides in skilled nursing facility.   Allergies  Allergen Reactions  . Oat Flour [Oat]     Unknown per MAR  . Other     Greens: unknown reaction per [facility]    Family History  Problem Relation Age of Onset  . Cancer Father     Prostate Cancer  . Heart disease Other     Parent  . Alcohol abuse Other     Parent  . Arthritis Other     parent  . Hypertension Other     Parent     Prior to Admission medications   Medication Sig Start Date End Date Taking? Authorizing Provider  albuterol (PROVENTIL HFA;VENTOLIN HFA) 108 (90 BASE) MCG/ACT inhaler Inhale 2 puffs into the lungs every 6 (six) hours as needed.   Yes Historical Provider, MD  DULoxetine (CYMBALTA) 60 MG  capsule Take 60 mg by mouth daily.   Yes Historical Provider, MD  folic acid (FOLVITE) 1 MG tablet Take 1 tablet (1 mg total) by mouth daily. 12/31/11  Yes Marianne L York, PA-C  guaiFENesin (MUCINEX) 600 MG 12 hr tablet Take 1 tablet (600 mg total) by mouth 2 (two) times daily. 12/31/11  Yes Marianne L York, PA-C  irbesartan (AVAPRO) 75 MG tablet Take 37.5 mg by mouth daily. 12/31/11  Yes Marianne L York, PA-C  lactulose (CHRONULAC) 10 GM/15ML solution Take 30 mLs (20 g total) by mouth daily. 12/31/11  Yes Marianne L York, PA-C  meloxicam (MOBIC) 7.5 MG tablet Take 7.5 mg by mouth daily.   Yes Historical Provider, MD  metoprolol tartrate (LOPRESSOR) 25 MG tablet Take 1 tablet (25  mg total) by mouth 2 (two) times daily. 12/31/11  Yes Marianne L York, PA-C  mirtazapine (REMERON) 15 MG tablet Take 15 mg by mouth at bedtime.   Yes Historical Provider, MD  Multiple Vitamin (MULTIVITAMIN) tablet Take 1 tablet by mouth daily.   Yes Historical Provider, MD  paliperidone (INVEGA) 6 MG 24 hr tablet Take 6 mg by mouth every morning.   Yes Historical Provider, MD  QUEtiapine (SEROQUEL) 50 MG tablet Take 50 mg by mouth 2 (two) times daily.   Yes Historical Provider, MD  rosuvastatin (CRESTOR) 20 MG tablet Take 20 mg by mouth daily.   Yes Historical Provider, MD  Sennosides-Docusate Sodium (SENNA PLUS PO) Take 1 tablet by mouth 2 (two) times daily as needed (constipation).    Yes Historical Provider, MD  thiamine (VITAMIN B-1) 100 MG tablet Take 100 mg by mouth daily.   Yes Historical Provider, MD  tiotropium (SPIRIVA) 18 MCG inhalation capsule Place 18 mcg into inhaler and inhale daily. 11/06/12  Yes Chesley Mires, MD  topiramate (TOPAMAX) 25 MG tablet Take 25 mg by mouth at bedtime.    Yes Historical Provider, MD  warfarin (COUMADIN) 5 MG tablet Take 5-7.5 mg by mouth daily. Take 5mg  daily except on Sundays take 7.5mg    Yes Historical Provider, MD   Physical Exam: Filed Vitals:   01/23/13 1800  BP: 130/65  Pulse: 91  Temp:   Resp: 26    BP 130/65  Pulse 91  Temp(Src) 99.3 F (37.4 C) (Oral)  Resp 26  Ht 6' (1.829 m)  Wt 64.3 kg (141 lb 12.1 oz)  BMI 19.22 kg/m2  SpO2 95%  General:   mild distress, likely because he feels like he has to urinate (after getting Lasix ) and we are keeping him in the bed. Breathing is somewhat labored  Eyes: Sclera nonicteric, extraocular movements are intact HEENT: Normocephalic and atraumatic, mucous membranes are slightly dry Cardiovascular: Episodes of atrial flutter, occasional irregular rhythm, borderline tachycardia Lungs: Decreased breath sounds throughout with bilateral end expiratory wheeze Abdomen: Soft, nontender, nondistended,  positive bowel sounds Extremities: No clubbing or cyanosis, trace pitting edema           Labs on Admission:  Basic Metabolic Panel:  Recent Labs Lab 01/23/13 1100  NA 140  K 4.4  CL 103  CO2 23  GLUCOSE 124*  BUN 12  CREATININE 0.71  CALCIUM 9.5   Liver Function Tests:  Recent Labs Lab 01/23/13 1100  AST 21  ALT 16  ALKPHOS 63  BILITOT 0.7  PROT 7.4  ALBUMIN 3.5    Recent Labs Lab 01/23/13 1100  LIPASE 9*   No results found for this basename: AMMONIA,  in the last  168 hours CBC:  Recent Labs Lab 01/23/13 1100  WBC 12.9*  NEUTROABS 9.4*  HGB 12.4*  HCT 36.6*  MCV 93.8  PLT 195   Cardiac Enzymes: No results found for this basename: CKTOTAL, CKMB, CKMBINDEX, TROPONINI,  in the last 168 hours  BNP (last 3 results)  Recent Labs  01/23/13 1653  PROBNP 1890.0*   CBG: No results found for this basename: GLUCAP,  in the last 168 hours  Radiological Exams on Admission: Dg Chest 2 View  01/23/2013   CLINICAL DATA:  Weakness, short of breath  EXAM: CHEST  2 VIEW  COMPARISON:  Prior chest x-ray 11/06/2012  FINDINGS: Left lower lobe infiltrate concerning for pneumonia. Cardiac and mediastinal contours are within normal limits. No pulmonary edema. No pneumothorax or large effusion. No acute osseous abnormality.  IMPRESSION: Left lower lobe infiltrate concerning for pneumonia.   Electronically Signed   By: Jacqulynn Cadet M.D.   On: 01/23/2013 12:26    EKG: Independently reviewed. Atrial flutter with nonspecific intraventricular conduction delay. This EKG is unchanged from previous  Assessment/Plan Active Problems:   Atrial flutter: Rate control, patient on Coumadin. INR almost therapeutic. Pharmacy managing. With diuresis, potentially possible Coumadin levels could drop   Tobacco abuse: Stable   HTN (hypertension): Tentatively giving beta blocker, watch for hypotension.   Hypothyroidism: On Synthroid   Acute on Chronic systolic congestive heart failure,  NYHA class 3: Another blood pressure stabilized, gently diuresing.    Pneumonia: No evidence of flu. Have started IV antibiotics and if no fever, tomorrow, could potentially change to by mouth Levaquin   Acute respiratory failure: Given fever, would treat this as mild pneumonia, but main cause is heart failure.    Code Status: Full code as best as we can ascertain  Family Communication: Unable to get a hold of family.  Disposition Plan: If stable, likely can transfer out of step down unit tomorrow.  Time spent: 45 minutes  [facility] Pager [phone]

## 2013-01-23 NOTE — Progress Notes (Signed)
ANTICOAGULATION CONSULT NOTE - Initial Consult  Pharmacy Consult for warfarin Indication: atrial flutter  Allergies  Allergen Reactions  . Oat Flour [Oat]     Unknown per MAR  . Other     Greens: unknown reaction per [facility]    Patient Measurements: Height: 6' (182.9 cm) Weight: 141 lb 12.1 oz (64.3 kg) IBW/kg (Calculated) : 77.6 Heparin Dosing Weight:   Vital Signs: Temp: 99.3 F (37.4 C) (01/03 1428) Temp src: Oral (01/03 1428) BP: 143/50 mmHg (01/03 1500) Pulse Rate: 71 (01/03 1500)  Labs:  Recent Labs  01/23/13 1100  HGB 12.4*  HCT 36.6*  PLT 195  CREATININE 0.71    Estimated Creatinine Clearance: 75.9 ml/min (by C-G formula based on Cr of 0.71).   Medical History: Past Medical History  Diagnosis Date  . Community acquired pneumonia     Right middle lobe community-acquired pneumonia  . Toxic metabolic encephalopathy   . Chronic atrial flutter     a. rate controlled with bb therapy;  b. h/o bradycardia when on digoxin 12/2011.  . Tobacco abuse   . History of coronary artery disease 2008    a. Cath at [facility]: RCA 95%, RI 60%  - medical therapy  . Ischemic cardiomyopathy     a. prev documented EF of 20-30%;  b. 09/2012 Echo: EF 40-45%, diff HK, mild MR, mild bi-atrial enlargement.  . History of prostate cancer   . Chronic obstructive pulmonary disease     with mild bronchospasm, improved  . Hypertension   . History of alcohol abuse     History of prior heavy alcohol abuse  . History of depression   . Altered mental status 2012   . Noncompliance   . Depression   . Ulcer   . Hx of migraines   . Hyperlipidemia   . Colon cancer      Assessment: 50 YOM presenting with flu-like symptoms (influenza PCR was negative).  He has history of aflutter which he is on chronic warfarin therapy.     Prior to admission dosing:  per [facility] MAR:  warfarin 5mg  daily with 7.5mg  on ThSu  Per coumadin clinic notes on  12/19, dose = warfarin 5mg  daily except 7.5mg   on Sundays (dose changed from above on 10/17)  Last dose 1/2 at 5pm  No current INR - order now   INR on 12/19 was 2.6  CBC: Hgb = 12.4, platelets WNL   Goal of Therapy:  INR 2-3 Monitor platelets by anticoagulation protocol: Yes   Plan:   Order INR now to determine dosing for tonight  Modify medication history to coincide with coumadin clinic dosing  Doreene Eland, PharmD, BCPS.   Pager: [phone]  01/23/2013,4:17 PM

## 2013-01-23 NOTE — Progress Notes (Signed)
ANTIBIOTIC CONSULT NOTE - INITIAL  Pharmacy Consult for vancomycin and cefepime Indication: rule out sepsis  Allergies  Allergen Reactions  . Oat Flour [Oat]     Unknown per MAR  . Other     Greens: unknown reaction per [facility]    Patient Measurements: Body Weight: 64kg  Vital Signs: Temp: 100.7 F (38.2 C) (01/03 1040) BP: 97/33 mmHg (01/03 1230) Pulse Rate: 71 (01/03 1230)  Intake/Output from this shift: Total I/O In: -  Out: 90 [Urine:90]  Labs:  Recent Labs  01/23/13 1100  WBC 12.9*  HGB 12.4*  PLT 195  CREATININE 0.71    Medical History: Past Medical History  Diagnosis Date  . Community acquired pneumonia     Right middle lobe community-acquired pneumonia  . Toxic metabolic encephalopathy   . Chronic atrial flutter     a. rate controlled with bb therapy;  b. h/o bradycardia when on digoxin 12/2011.  . Tobacco abuse   . History of coronary artery disease 2008    a. Cath at [facility]: RCA 95%, RI 60%  - medical therapy  . Ischemic cardiomyopathy     a. prev documented EF of 20-30%;  b. 09/2012 Echo: EF 40-45%, diff HK, mild MR, mild bi-atrial enlargement.  . History of prostate cancer   . Chronic obstructive pulmonary disease     with mild bronchospasm, improved  . Hypertension   . History of alcohol abuse     History of prior heavy alcohol abuse  . History of depression   . Altered mental status 2012   . Noncompliance   . Depression   . Ulcer   . Hx of migraines   . Hyperlipidemia   . Colon cancer     Assessment: 48 yoM admitted 01/24/12 with suspected sepsis from NH. Noted cefepime 2g IV x1 and vancomycin 1g IV x 1 ordered to be given in the ED.  Cefepime 2g IV given at 1247, vancomycin dose has not yet been charted  Antiinfectives 1/3 >> cefepime >> 1/3 >> vancomycin >>    Tmax: 100.7 WBCs: elevated at 12.9 Renal: SCr 0.71, CrCl 60 ml/min (CG using SCr= 1.0 for advanced age), 74 ml/min normalized  Microbiology 1/3 blood x2:  collected 1/3 urine: collected   Dose changes/drug level info:   1/3: D1 cefepime 2g x1 then 1g q8h/ vancomycin 1g + 500mg  for a 1500mg  LD then 500mg  q12h for sepsis   Goal of Therapy:  Vancomycin trough level 15-20 mcg/ml eradication of infection  Plan:  - vancomycin 500mg  IV x1 added to the 1g dose already ordered for a 1500mg  loading dose - start vancomycin 500mg  IV q12h at 0200 on 01/24/13 - start cefepime 1g IV q8h at 1800 today - vancomycin trough at steady state if indicated - follow-up clinical course, culture results, renal function - follow-up antibiotic de-escalation and length of therapy   Thank you for the consult.  Johny Drilling, PharmD, BCPS Clinical Pharmacist Pager: [phone] Pharmacy: [phone] 01/23/2013 1:24 PM

## 2013-01-23 NOTE — ED Notes (Signed)
Per EMS patient from [facility]. Has sob, coughing , fever and generalized weakness. Temp 100.4-tylenol 1000mg  at 1012 given. 110/68, 72, 18, 100% on 2L but 92% on RA. cbg 104.

## 2013-01-23 NOTE — ED Provider Notes (Signed)
CSN: [id number]     Arrival date & time 01/23/13  1034 History   First MD Initiated Contact with Patient 01/23/13 1037     Chief Complaint  Patient presents with  . Influenza   (Consider location/radiation/quality/duration/timing/severity/associated sxs/prior Treatment) HPI Comments: Patient is a 73 year old male history of a flutter, coronary artery disease, ischemic cardiomyopathy, COPD, hypertension, hyperlipidemia, colon cancer, prostate cancer who resides at [facility]. He was brought in today for 3 days of gradually worsening generalized weakness, fever, cough. He was given 1000 mg of Tylenol at 10:12 AM. His fever was as high as 100.79F. He was placed on 2 L of oxygen. His does not use oxygen at [employer] care.  History is limited due to the patient being a poor historian.  The history is provided by the patient and the EMS personnel.    Past Medical History  Diagnosis Date  . Community acquired pneumonia     Right middle lobe community-acquired pneumonia  . Toxic metabolic encephalopathy   . Chronic atrial flutter     a. rate controlled with bb therapy;  b. h/o bradycardia when on digoxin 12/2011.  . Tobacco abuse   . History of coronary artery disease 2008    a. Cath at [facility]: RCA 95%, RI 60%  - medical therapy  . Ischemic cardiomyopathy     a. prev documented EF of 20-30%;  b. 09/2012 Echo: EF 40-45%, diff HK, mild MR, mild bi-atrial enlargement.  . History of prostate cancer   . Chronic obstructive pulmonary disease     with mild bronchospasm, improved  . Hypertension   . History of alcohol abuse     History of prior heavy alcohol abuse  . History of depression   . Altered mental status 2012   . Noncompliance   . Depression   . Ulcer   . Hx of migraines   . Hyperlipidemia   . Colon cancer    Past Surgical History  Procedure Laterality Date  . Appendectomy     Family History  Problem Relation Age of Onset  . Cancer Father     Prostate Cancer  . Heart  disease Other     Parent  . Alcohol abuse Other     Parent  . Arthritis Other     parent  . Hypertension Other     Parent   History  Substance Use Topics  . Smoking status: Current Every Day Smoker -- 0.50 packs/day for 40 years    Types: Cigarettes  . Smokeless tobacco: Never Used  . Alcohol Use: Yes    Review of Systems  Unable to perform ROS: Other    Allergies  Oat flour  Home Medications   Current Outpatient Rx  Name  Route  Sig  Dispense  Refill  . albuterol (PROVENTIL HFA;VENTOLIN HFA) 108 (90 BASE) MCG/ACT inhaler   Inhalation   Inhale 2 puffs into the lungs every 6 (six) hours as needed.         . beclomethasone (QVAR) 40 MCG/ACT inhaler   Inhalation   Inhale into the lungs 2 (two) times daily.         . DULoxetine (CYMBALTA) 60 MG capsule   Oral   Take 60 mg by mouth daily.         . folic acid (FOLVITE) 1 MG tablet   Oral   Take 1 tablet (1 mg total) by mouth daily.   30 tablet   1   . guaiFENesin ([place])  600 MG 12 hr tablet   Oral   Take 1 tablet (600 mg total) by mouth 2 (two) times daily.         . irbesartan (AVAPRO) 75 MG tablet   Oral   Take 1 tablet (75 mg total) by mouth daily.   30 tablet   1   . lactulose (CHRONULAC) 10 GM/15ML solution   Oral   Take 30 mLs (20 g total) by mouth daily.   240 mL   0   . meloxicam (MOBIC) 7.5 MG tablet   Oral   Take 7.5 mg by mouth daily.         . metoprolol tartrate (LOPRESSOR) 25 MG tablet   Oral   Take 1 tablet (25 mg total) by mouth 2 (two) times daily.   60 tablet   0   . mirtazapine (REMERON) 15 MG tablet   Oral   Take 15 mg by mouth at bedtime.         . Multiple Vitamin (MULTIVITAMIN) tablet   Oral   Take 1 tablet by mouth daily.         . paliperidone (INVEGA) 6 MG 24 hr tablet   Oral   Take 6 mg by mouth every morning.         Marland Kitchen QUEtiapine (SEROQUEL) 50 MG tablet   Oral   Take 50 mg by mouth 2 (two) times daily.         . rosuvastatin (CRESTOR) 20  MG tablet   Oral   Take 20 mg by mouth daily.         Orlie Dakin Sodium (SENNA PLUS PO)   Oral   Take 1 tablet by mouth 2 (two) times daily as needed (constipation).          . thiamine (VITAMIN B-1) 100 MG tablet   Oral   Take 100 mg by mouth daily.         Marland Kitchen tiotropium (SPIRIVA) 18 MCG inhalation capsule   Inhalation   Place 1 capsule (18 mcg total) into inhaler and inhale daily.   30 capsule   5   . topiramate (TOPAMAX) 25 MG tablet   Oral   Take 25 mg by mouth at bedtime.          Marland Kitchen warfarin (COUMADIN) 5 MG tablet   Oral   Take 1 tablet (5 mg total) by mouth daily.         Marland Kitchen warfarin (COUMADIN) 7.5 MG tablet   Oral   Take 7.5 mg by mouth as directed. Takes 7.5 mg on thurs and sundays then 5 mg the rest of the week          BP 104/61  Pulse 98  Temp(Src) 100.7 F (38.2 C)  Resp 18  SpO2 98% Physical Exam  Nursing note and vitals reviewed. Constitutional: He is oriented to person, place, and time. He appears well-developed and well-nourished. He appears ill. No distress.  HENT:  Head: Normocephalic and atraumatic.  Right Ear: External ear normal.  Left Ear: External ear normal.  Nose: Nose normal.  Eyes: Conjunctivae are normal.  Neck: Normal range of motion. No tracheal deviation present.  Cardiovascular: Normal rate, regular rhythm and normal heart sounds.   Pulmonary/Chest: Effort normal. No stridor. He has rhonchi. He has rales.  Abdominal: Soft. He exhibits no distension. There is no tenderness.  Musculoskeletal: Normal range of motion.  Neurological: He is alert and oriented to person, place, and time.  Skin: Skin is warm and dry. He is not diaphoretic.  Psychiatric: He has a normal mood and affect. His behavior is normal.    ED Course  Procedures (including critical care time) Labs Review Labs Reviewed  CBC WITH DIFFERENTIAL - Abnormal; Notable for the following:    WBC 12.9 (*)    RBC 3.90 (*)    Hemoglobin 12.4 (*)     HCT 36.6 (*)    Neutro Abs 9.4 (*)    Monocytes Relative 14 (*)    Monocytes Absolute 1.8 (*)    All other components within normal limits  COMPREHENSIVE METABOLIC PANEL - Abnormal; Notable for the following:    Glucose, Bld 124 (*)    All other components within normal limits  LIPASE, BLOOD - Abnormal; Notable for the following:    Lipase 9 (*)    All other components within normal limits  CULTURE, BLOOD (ROUTINE X 2)  CULTURE, BLOOD (ROUTINE X 2)  URINE CULTURE  URINALYSIS, ROUTINE W REFLEX MICROSCOPIC  INFLUENZA PANEL BY PCR  CG4 I-STAT (LACTIC ACID)   Imaging Review Dg Chest 2 View  01/23/2013   CLINICAL DATA:  Weakness, short of breath  EXAM: CHEST  2 VIEW  COMPARISON:  Prior chest x-ray 11/06/2012  FINDINGS: Left lower lobe infiltrate concerning for pneumonia. Cardiac and mediastinal contours are within normal limits. No pulmonary edema. No pneumothorax or large effusion. No acute osseous abnormality.  IMPRESSION: Left lower lobe infiltrate concerning for pneumonia.   Electronically Signed   By: Jacqulynn Cadet M.D.   On: 01/23/2013 12:26    EKG Interpretation    Date/Time:  Saturday January 23 2013 10:48:26 EST Ventricular Rate:  91 PR Interval:    QRS Duration: 151 QT Interval:  352 QTC Calculation: 433 R Axis:   -26 Text Interpretation:  Atrial flutter Nonspecific intraventricular conduction delay Borderline repol abnormality, diffuse leads No significant change since last tracing Confirmed by POLLINA  MD, CHRISTOPHER ([id number]) on 01/23/2013 11:09:30 AM            MDM  No diagnosis found.  Pt presents to ED with fevers, weakness, and cough. Pt found to have PNA on xray. Influenza PCR is pending. Patient has increased oxygen requirements. On 3L of oxygen via [place] at this time. Pt started on vancomycin and cefepime in ED. Will be admitted to hospitalist service for further care. Admission is appreciated. Vital signs stable for transfer to the floor. Discussed case with  Dr. Betsey Holiday who agrees with plan. Patient / Family / Caregiver informed of clinical course, understand medical decision-making process, and agree with plan.   Medications  vancomycin (VANCOCIN) IVPB 1000 mg/200 mL premix (not administered)  vancomycin (VANCOCIN) 500 mg in sodium chloride 0.9 % 100 mL IVPB (not administered)  vancomycin (VANCOCIN) 500 mg in sodium chloride 0.9 % 100 mL IVPB (not administered)  ceFEPIme (MAXIPIME) 1 g in dextrose 5 % 50 mL IVPB (not administered)  0.9 %  sodium chloride infusion (1,000 mLs Intravenous New Bag/Given 01/23/13 1239)    Followed by  0.9 %  sodium chloride infusion (not administered)  albuterol (PROVENTIL) (2.5 MG/3ML) 0.083% nebulizer solution 5 mg (5 mg Nebulization Given 01/23/13 1100)  ipratropium (ATROVENT) nebulizer solution 0.5 mg (0.5 mg Nebulization Given 01/23/13 1100)  sodium chloride 0.9 % bolus 1,000 mL (0 mLs Intravenous Stopped 01/23/13 1254)  ceFEPIme (MAXIPIME) 2 g in dextrose 5 % 50 mL IVPB (2 g Intravenous New Bag/Given 01/23/13 1247)  Elwyn Lade, PA-C 01/23/13 1358

## 2013-01-23 NOTE — ED Provider Notes (Signed)
Medical screening examination/treatment/procedure(s) were conducted as a shared visit with non-physician practitioner(s) and myself.  I personally evaluated the patient during the encounter.  EKG Interpretation    Date/Time:  Saturday January 23 2013 10:48:26 EST Ventricular Rate:  91 PR Interval:    QRS Duration: 151 QT Interval:  352 QTC Calculation: 433 R Axis:   -26 Text Interpretation:  Atrial flutter Nonspecific intraventricular conduction delay Borderline repol abnormality, diffuse leads No significant change since last tracing Confirmed by POLLINA  MD, CHRISTOPHER ([id number]) on 01/23/2013 11:09:30 AM            Patient seen for progressively worsening weakness with fever and cough. Workup reveals pneumonia. Patient is not in any significant respiratory distress, but does have evidence of hypoxia. Will require hospitalization for further management.  Orpah Greek, MD 01/23/13 [phone]

## 2013-01-24 DIAGNOSIS — J438 Other emphysema: Secondary | ICD-10-CM

## 2013-01-24 DIAGNOSIS — I2510 Atherosclerotic heart disease of native coronary artery without angina pectoris: Secondary | ICD-10-CM

## 2013-01-24 DIAGNOSIS — I10 Essential (primary) hypertension: Secondary | ICD-10-CM

## 2013-01-24 DIAGNOSIS — E039 Hypothyroidism, unspecified: Secondary | ICD-10-CM

## 2013-01-24 LAB — BASIC METABOLIC PANEL
BUN: 10 mg/dL (ref 6–23)
CO2: 22 mEq/L (ref 19–32)
Calcium: 8.9 mg/dL (ref 8.4–10.5)
Chloride: 103 mEq/L (ref 96–112)
Creatinine, Ser: 0.75 mg/dL (ref 0.50–1.35)
GFR, EST NON AFRICAN AMERICAN: 89 mL/min — AB (ref >90)
Glucose, Bld: 118 mg/dL — ABNORMAL HIGH (ref 70–99)
Potassium: 3.9 mEq/L (ref 3.7–5.3)
Sodium: 137 mEq/L (ref 137–147)

## 2013-01-24 LAB — CBC
HCT: 35 % — ABNORMAL LOW (ref 39.0–52.0)
HEMOGLOBIN: 12.1 g/dL — AB (ref 13.0–17.0)
MCH: 32.7 pg (ref 26.0–34.0)
MCHC: 34.6 g/dL (ref 30.0–36.0)
MCV: 94.6 fL (ref 78.0–100.0)
Platelets: 193 10*3/uL (ref 150–400)
RBC: 3.7 MIL/uL — ABNORMAL LOW (ref 4.22–5.81)
RDW: 13.6 % (ref 11.5–15.5)
WBC: 14 10*3/uL — AB (ref 4.0–10.5)

## 2013-01-24 LAB — MAGNESIUM: Magnesium: 1.9 mg/dL (ref 1.5–2.5)

## 2013-01-24 LAB — STREP PNEUMONIAE URINARY ANTIGEN: Strep Pneumo Urinary Antigen: NEGATIVE

## 2013-01-24 LAB — PROTIME-INR
INR: 2.04 — AB (ref 0.00–1.49)
Prothrombin Time: 22.4 seconds — ABNORMAL HIGH (ref 11.6–15.2)

## 2013-01-24 LAB — URINE CULTURE: Colony Count: 5000

## 2013-01-24 MED ORDER — BENZONATATE 100 MG PO CAPS
100.0000 mg | ORAL_CAPSULE | Freq: Two times a day (BID) | ORAL | Status: DC | PRN
  Filled 2013-01-24: qty 1

## 2013-01-24 MED ORDER — METOPROLOL TARTRATE 12.5 MG HALF TABLET
12.5000 mg | ORAL_TABLET | Freq: Two times a day (BID) | ORAL | Status: DC
  Administered 2013-01-24 – 2013-01-27 (×6): 12.5 mg via ORAL
  Filled 2013-01-24 (×8): qty 1

## 2013-01-24 MED ORDER — GUAIFENESIN-DM 100-10 MG/5ML PO SYRP
5.0000 mL | ORAL_SOLUTION | ORAL | Status: DC | PRN
  Administered 2013-01-24 – 2013-01-25 (×2): 5 mL via ORAL
  Filled 2013-01-24 (×2): qty 10

## 2013-01-24 MED ORDER — KCL IN DEXTROSE-NACL 20-5-0.45 MEQ/L-%-% IV SOLN
INTRAVENOUS | Status: DC
  Administered 2013-01-24 – 2013-01-27 (×3): via INTRAVENOUS
  Filled 2013-01-24 (×5): qty 1000

## 2013-01-24 MED ORDER — WARFARIN SODIUM 5 MG PO TABS
5.0000 mg | ORAL_TABLET | Freq: Once | ORAL | Status: AC
  Administered 2013-01-24: 5 mg via ORAL
  Filled 2013-01-24: qty 1

## 2013-01-24 NOTE — Evaluation (Addendum)
Clinical/Bedside Swallow Evaluation Patient Details  Name: David [name] MRN: [id number] Date of Birth: [birth year]  Today's Date: 01/24/2013 Time: 7619-5093 SLP Time Calculation (min): 30 min  Past Medical History:  Past Medical History  Diagnosis Date  . Community acquired pneumonia     Right middle lobe community-acquired pneumonia  . Toxic metabolic encephalopathy   . Chronic atrial flutter     a. rate controlled with bb therapy;  b. h/o bradycardia when on digoxin 12/2011.  . Tobacco abuse   . History of coronary artery disease 2008    a. Cath at [facility]: RCA 95%, RI 60%  - medical therapy  . Ischemic cardiomyopathy     a. prev documented EF of 20-30%;  b. 09/2012 Echo: EF 40-45%, diff HK, mild MR, mild bi-atrial enlargement.  . History of prostate cancer   . Chronic obstructive pulmonary disease     with mild bronchospasm, improved  . Hypertension   . History of alcohol abuse     History of prior heavy alcohol abuse  . History of depression   . Altered mental status 2012   . Noncompliance   . Depression   . Ulcer   . Hx of migraines   . Hyperlipidemia   . Colon cancer    Past Surgical History:  Past Surgical History  Procedure Laterality Date  . Appendectomy     HPI:  Past medical history of COPD, a flutter on chronic anticoagulation, ischemic cardiomyopathy with decreased ejection fraction who resides in a skilled nursing facility and brought in after 3 days of increasing weakness plus cough plus reported fever. He was noted to be hypoxic requiring 2 L of oxygen at his baseline is no oxygen. In the emergency room, patient was noted to have a white blood cell count of 12.9, negative flu titer and noted hypoxia. He was started on broad-spectrum antibiotics and hospitalists were called for further evaluation and admission. Patient was placed to step down unit given full CODE STATUS, hypoxia and borderline hypotension.  CXR:  Left lower lobe infiltrate concerning for  pneumonia. 2007 cervical spine shows osteophytes.    BSE indicated to assess risk for aspiration.  MBS completed 12/24/11 indicates silent penetration of all consistencies with decreased closure of the airway during swallow.  Trace penetration particularly with puree consistency.   Diet rec's from MBS dysphagia 3 and thin liquids with use of swallow strategies of chin tuck and intermittent throat clear.  BSE ordered to assess risk for aspiration.     Assessment / Plan / Recommendation Clinical Impression  BSE completed but limited.  Patient observed to lean heavily to right and repositoned upright  x3 during evaluation.    Patient with am meal tray in front of him.   Declined thin liquid trials.  Patient reports inability to swallow the oatmeal and/or grits.   SLP administered initial trial of puree (yogart) with patient self administering further trials with assist.  Initiation of swallow delayed with reduced laryngeal elevation which is judged to be baseline as documented from [facility].  RR increased to upper 20's with oxygen saturations decreasing to 89 percent  During even limited PO's. RN reports vital signs will change due to patient moving his hand with sensor but  oxygen levels will increase back to 90's.    Attempted swallow strategies, as recommended from MBS, completed 12/24/11, of chin tuck and intermittent throat clear with puree trials but patient cognitively unable to complete postural strategy effectively.  Returned to room to  attempt further trials of soft solids with RN present but requested to defer trials at this time as patient requiring oxygen  per nasal cannula and due to change in vital signs.   Current diet consistency regular with thin liquids.   Paged MD with results of evaluation to confirm to proceed with modified diet or change to NPO status due to concerns with patient presenting with decreased ability to protect his airway fully with PO's.   MD confirmed to proceed with  NPO status.    ST to f/u on 01/25/13 to reassess swallow to determine PO readiness.  Hopeful to return to PO's when respiratory status improves.   Completion of objective evaluation to be determined.   Aspiration Risk  Moderate    Diet Recommendation NPO  Other  Recommendations Oral Care Recommendations: Oral care Q4 per protocol   Follow Up Recommendations  Skilled Nursing facility    Frequency and Duration min 2x/week  2 weeks   Pertinent Vitals/Pain RR upper 20's during PO's     SLP Swallow Goals Please refer to Care Plan Goals.    Swallow Study Prior Functional Status   ALF    General Date of Onset: 01/23/13 HPI: Past medical history of COPD, a flutter on chronic anticoagulation, ischemic cardiomyopathy with decreased ejection fraction who resides in a skilled nursing facility and brought in after 3 days of increasing weakness plus cough plus reported fever. He was noted to be hypoxic requiring 2 L of oxygen at his baseline is no oxygen. In the emergency room, patient was noted to have a white blood cell count of 12.9, negative flu titer and noted hypoxia. He was started on broad-spectrum antibiotics and hospitalists were called for further evaluation and admission. Patient was placed to step down unit given full CODE STATUS, hypoxia and borderline hypotension. Type of Study: Bedside swallow evaluation Previous Swallow Assessment: MBS dys 3/thin liquids with stragies of chin tuck and intermittent throat clear  Diet Prior to this Study: Regular;Thin liquids Respiratory Status: Room air History of Recent Intubation: No Behavior/Cognition: Requires cueing;Lethargic;Cooperative Oral Cavity - Dentition: Edentulous Self-Feeding Abilities: Needs assist;Needs set up Patient Positioning: Other (comment) (leaning heavily to right ) Baseline Vocal Quality: Hoarse;Low vocal intensity Volitional Cough: Congested;Wet Volitional Swallow: Able to elicit    Oral/Motor/Sensory Function Overall Oral  Motor/Sensory Function: Appears within functional limits for tasks assessed   Ice Chips Ice chips: Not tested   Thin Liquid Thin Liquid: Impaired Presentation: Cup;Spoon;Straw Pharyngeal  Phase Impairments: Suspected delayed Swallow;Decreased hyoid-laryngeal movement    Nectar Thick Nectar Thick Liquid: Not tested   Honey Thick Honey Thick Liquid: Not tested   Puree Puree: Impaired Pharyngeal Phase Impairments: Suspected delayed Swallow;Decreased hyoid-laryngeal movement;Change in Vital Signs   Solid   GO    Other Comments: declined  trials of regular solids       Sharman Crate [place], [facility] 01/24/2013,11:52 AM

## 2013-01-24 NOTE — Progress Notes (Signed)
[facility] PROGRESS NOTE  David [name] PYP:[id number] DOB: October 21, 1940 DOA: 01/23/2013 PCP: Provider Not In System  Assessment/Plan: Principal Problem:   Acute on chronic systolic heart failure -Patient presented with elevated BNP and currently being diuresis with Lasix. Given soft blood pressures and will hold Lasix and fluid restrict. - Continue supplemental oxygen and supportive therapy - 2-D echo and 214 showed an EF of 40-45%  Active Problems:   Atrial flutter -Currently rate controlled with less heart rate reported at 72 - Will continue Coumadin and metoprolol - Will obtain magnesium level and continue to monitor closely.    Tobacco abuse - Continue to encourage cessation    HTN (hypertension) - Patient's blood pressures at times abdomen soft but given atrial flutter patient will require rate controlling agent. We'll plan on decreasing metoprolol dose to 12.5 mg by mouth twice a day    Hypothyroidism - Will obtain TSH levels at this time    Pneumonia - Chest x-ray reported left lower lobe infiltrate concerning for pneumonia - Patient is currently on Maxipime and vancomycin - I will also obtain urine Legionella/strep antigen - Place order for sputum culture - Lactic acid levels within normal limits and reassuring.    Acute respiratory failure - Secondary to pneumonia and primary problem - Please see above plans - Continue to monitor in ICU   Code Status: presumed full Family Communication: No family at bedside. Disposition Plan: Pending improvement in breathing condition.   Consultants:  None  Procedures:  None  Antibiotics:  Cefepime  Vancomycin  HPI/Subjective: Patient has no new complaints. States his breathing is better than initial presentation. Nursing reports the patient has been pulling at things including his condom catheter.  Objective: Filed Vitals:   01/24/13 0801  BP: 105/40  Pulse: 72  Temp:   Resp: 22    Intake/Output  Summary (Last 24 hours) at 01/24/13 0951 Last data filed at 01/24/13 0600  Gross per 24 hour  Intake 448.33 ml  Output   1590 ml  Net -1141.67 ml   Filed Weights   01/23/13 1500 01/24/13 0400  Weight: 64.3 kg (141 lb 12.1 oz) 62.4 kg (137 lb 9.1 oz)    Exam:   General:  Pt Alert and Awake, limited responses to examiner, in NAD  Cardiovascular: irregularly irregular, no murmurs  Respiratory: Rhales at bases, no wheezes, breath sounds BL  Abdomen: soft, NT, ND  Musculoskeletal: moves extremities equally   Data Reviewed: Basic Metabolic Panel:  Recent Labs Lab 01/23/13 1100 01/24/13 0354  NA 140 137  K 4.4 3.9  CL 103 103  CO2 23 22  GLUCOSE 124* 118*  BUN 12 10  CREATININE 0.71 0.75  CALCIUM 9.5 8.9   Liver Function Tests:  Recent Labs Lab 01/23/13 1100  AST 21  ALT 16  ALKPHOS 63  BILITOT 0.7  PROT 7.4  ALBUMIN 3.5    Recent Labs Lab 01/23/13 1100  LIPASE 9*   No results found for this basename: AMMONIA,  in the last 168 hours CBC:  Recent Labs Lab 01/23/13 1100 01/24/13 0354  WBC 12.9* 14.0*  NEUTROABS 9.4*  --   HGB 12.4* 12.1*  HCT 36.6* 35.0*  MCV 93.8 94.6  PLT 195 193   Cardiac Enzymes: No results found for this basename: CKTOTAL, CKMB, CKMBINDEX, TROPONINI,  in the last 168 hours BNP (last 3 results)  Recent Labs  01/23/13 1653  PROBNP 1890.0*   CBG: No results found for this basename: GLUCAP,  in the  last 168 hours  Recent Results (from the past 240 hour(s))  MRSA PCR SCREENING     Status: None   Collection Time    01/23/13  3:17 PM      Result Value Range Status   MRSA by PCR NEGATIVE  NEGATIVE Final   Comment:            The GeneXpert MRSA Assay (FDA     approved for NASAL specimens     only), is one component of a     comprehensive MRSA colonization     surveillance program. It is not     intended to diagnose MRSA     infection nor to guide or     monitor treatment for     MRSA infections.      Studies: Dg Chest 2 View  01/23/2013   CLINICAL DATA:  Weakness, short of breath  EXAM: CHEST  2 VIEW  COMPARISON:  Prior chest x-ray 11/06/2012  FINDINGS: Left lower lobe infiltrate concerning for pneumonia. Cardiac and mediastinal contours are within normal limits. No pulmonary edema. No pneumothorax or large effusion. No acute osseous abnormality.  IMPRESSION: Left lower lobe infiltrate concerning for pneumonia.   Electronically Signed   By: Jacqulynn Cadet M.D.   On: 01/23/2013 12:26    Scheduled Meds: . antiseptic oral rinse  15 mL Mouth Rinse q12n4p  . atorvastatin  40 mg Oral q1800  . ceFEPime (MAXIPIME) IV  1 g Intravenous Q8H  . chlorhexidine  15 mL Mouth Rinse BID  . DULoxetine  60 mg Oral Daily  . folic acid  1 mg Oral Daily  . furosemide  20 mg Intravenous Q12H  . metoprolol tartrate  25 mg Oral BID  . mirtazapine  15 mg Oral QHS  . multivitamin with minerals  1 tablet Oral Daily  . paliperidone  6 mg Oral Daily  . QUEtiapine  50 mg Oral BID  . sodium chloride  3 mL Intravenous Q12H  . thiamine  100 mg Oral Daily  . topiramate  25 mg Oral QHS  . vancomycin  500 mg Intravenous Q12H  . warfarin  5 mg Oral ONCE-1800  . Warfarin - Pharmacist Dosing Inpatient   Does not apply q1800   Continuous Infusions:   Principal Problem:   Acute on chronic systolic heart failure Active Problems:   Atrial flutter   Tobacco abuse   HTN (hypertension)   Hypothyroidism   Pneumonia   Acute respiratory failure    Time spent: > 45 minutes of critical care time.    Velvet Bathe  [facility] Pager [phone]. If 7PM-7AM, please contact night-coverage at [email or web address], password [facility] 01/24/2013, 9:51 AM  LOS: 1 day

## 2013-01-24 NOTE — Progress Notes (Signed)
ANTICOAGULATION CONSULT NOTE - Follow Up Consult  Pharmacy Consult for warfarin Indication: atrial flutter  Allergies  Allergen Reactions  . Oat Flour [Oat]     Unknown per MAR  . Other     Greens: unknown reaction per [facility]    Patient Measurements: Height: 6' (182.9 cm) Weight: 137 lb 9.1 oz (62.4 kg) IBW/kg (Calculated) : 77.6 Heparin Dosing Weight:   Vital Signs: Temp: 97.1 F (36.2 C) (01/04 0800) Temp src: Oral (01/04 0800) BP: 105/44 mmHg (01/04 0400) Pulse Rate: 96 (01/04 0400)  Labs:  Recent Labs  01/23/13 1100 01/23/13 1653 01/24/13 0354  HGB 12.4*  --  12.1*  HCT 36.6*  --  35.0*  PLT 195  --  193  LABPROT  --  21.7* 22.4*  INR  --  1.96* 2.04*  CREATININE 0.71  --  0.75    Estimated Creatinine Clearance: 73.7 ml/min (by C-G formula based on Cr of 0.75).  Assessment: 42 YOM presenting with flu-like symptoms (influenza PCR was negative). He has history of aflutter which he is on chronic warfarin therapy.  INR at admission was 1.96   Today's INR = 2.04 following boosted dose last night of 7.5mg  for slightly subtherapeutic INR  CBC: Hgb and platelets stable  Diet: NPO  Goal of Therapy:  INR 2-3 Monitor platelets by anticoagulation protocol: Yes   Plan:   INR therapeutic this am, since NPO will give warfarin 5mg  PO x 1 tonight   Daily INR  Doreene Eland, PharmD, BCPS.   Pager: [phone]  01/24/2013,8:27 AM

## 2013-01-25 ENCOUNTER — Encounter (HOSPITAL_COMMUNITY): Payer: Self-pay

## 2013-01-25 DIAGNOSIS — Z7901 Long term (current) use of anticoagulants: Secondary | ICD-10-CM

## 2013-01-25 LAB — CBC
HEMATOCRIT: 36.9 % — AB (ref 39.0–52.0)
HEMOGLOBIN: 12.7 g/dL — AB (ref 13.0–17.0)
MCH: 32.4 pg (ref 26.0–34.0)
MCHC: 34.4 g/dL (ref 30.0–36.0)
MCV: 94.1 fL (ref 78.0–100.0)
Platelets: 175 10*3/uL (ref 150–400)
RBC: 3.92 MIL/uL — ABNORMAL LOW (ref 4.22–5.81)
RDW: 13.5 % (ref 11.5–15.5)
WBC: 10.5 10*3/uL (ref 4.0–10.5)

## 2013-01-25 LAB — BASIC METABOLIC PANEL
BUN: 12 mg/dL (ref 6–23)
CHLORIDE: 103 meq/L (ref 96–112)
CO2: 23 mEq/L (ref 19–32)
CREATININE: 0.68 mg/dL (ref 0.50–1.35)
Calcium: 9.1 mg/dL (ref 8.4–10.5)
Glucose, Bld: 123 mg/dL — ABNORMAL HIGH (ref 70–99)
Potassium: 4.2 mEq/L (ref 3.7–5.3)
Sodium: 138 mEq/L (ref 137–147)

## 2013-01-25 LAB — LEGIONELLA ANTIGEN, URINE: Legionella Antigen, Urine: NEGATIVE

## 2013-01-25 LAB — PROTIME-INR
INR: 2.1 — ABNORMAL HIGH (ref 0.00–1.49)
PROTHROMBIN TIME: 22.9 s — AB (ref 11.6–15.2)

## 2013-01-25 MED ORDER — WARFARIN SODIUM 5 MG PO TABS
5.0000 mg | ORAL_TABLET | Freq: Once | ORAL | Status: AC
  Administered 2013-01-25: 5 mg via ORAL
  Filled 2013-01-25: qty 1

## 2013-01-25 NOTE — Progress Notes (Signed)
Speech Language Pathology Treatment:    Patient Details Name: David [name] MRN: [id number] DOB: [birth year] Today's Date: 01/25/2013 Time: 8676-1950 SLP Time Calculation (min): 23 min  Assessment / Plan / Recommendation Clinical Impression  Pt with improved level of alertness today compared to yesterday - requesting applesauce.  Observed pt eating icecream, applesauce, graham cracker, and water.  Chin tuck posture encouraged and pt conducted with total cues.  Cough x2 noted during "snack" observed.  Pt has h/o laryngeal penetration of all consistencies during MBS 12-2011 and highly suspect this dysphagia ongoing.  Pt desires to eat but declined to consume more than 2 sips of thin - requesting icecream and applesauce.      Rec consider diet of soft/ground meats/thin with chin tuck and intermittent throat clearing.  Soft diet recommended due to pt's poor dentition and for energy conservation.    Pt verbalized that he will not do a chin tuck even after slp educated him to clinical reasoning.  Compliance may be an issue but pt has sitter in room with him and suspect will do better during meals with encouragement.  SLP to follow up for skilled SLP tx to mitigate chronic aspiration risk.      HPI HPI: Past medical history of COPD, a flutter on chronic anticoagulation, ischemic cardiomyopathy with decreased ejection fraction who resides in a skilled nursing facility and brought in after 3 days of increasing weakness plus cough plus reported fever. He was noted to be hypoxic requiring 2 L of oxygen at his baseline is no oxygen. In the emergency room, patient was noted to have a white blood cell count of 12.9, negative flu titer and noted hypoxia. He was started on broad-spectrum antibiotics and hospitalists were called for further evaluation and admission. Pt was seen for swallow eval by slp yesterday and was kept npo per md due to ams.  Today pt seen to reassess swallow function and readiness for dietary  advancement.    Pertinent Vitals Afebrile, decreased, rales at bases  SLP Plan  Continue with current plan of care    Recommendations Diet recommendations: Dysphagia 3 (mechanical soft);Thin liquid Liquids provided via: Cup;Straw Medication Administration: Whole meds with puree (crush if large and not contranidicated) Supervision: Patient able to self feed;Full supervision/cueing for compensatory strategies Compensations: Slow rate;Small sips/bites;Clear throat intermittently Postural Changes and/or Swallow Maneuvers: Chin tuck              Oral Care Recommendations: Oral care BID Follow up Recommendations: Skilled Nursing facility Plan: Continue with current plan of care    [place], [place] [facility] SLP [phone]

## 2013-01-25 NOTE — Progress Notes (Signed)
[facility] PROGRESS NOTE  David [name] CWC:[id number] DOB: October 24, 1940 DOA: 01/23/2013 PCP: Provider Not In System  Assessment/Plan: Principal Problem:   Acute on chronic systolic heart failure -Patient presented with elevated BNP and currently being diuresis with Lasix. Given soft blood pressures and will hold Lasix and fluid restrict. - Continue supplemental oxygen and supportive therapy - 2-D echocardiogram 09/20/2012 reported EF of 40-45%. - Complicated in that speech therapy recommended n.p.o. with plans to reassess today we'll place on gentle maintenance IV fluids while n.p.o. with plans to discontinue fluids once patient eating and drinking  Active Problems:   Atrial flutter - Given soft her blood pressures metoprolol dose decreased heart rate has ranged from 69-117 with most values within normal limits. Heart rate become elevated and pressures permit we'll plan on increasing metoprolol dose - Will continue Coumadin and metoprolol - Magnesium levels within normal limits at 1.9    Tobacco abuse - Continue to encourage cessation    HTN (hypertension) - Continue metoprolol at lower dose 12.5 mg  - Will continue to monitor blood pressures and suspect blood pressures well rise once patient is tolerating oral intake    Hypothyroidism - Will obtain TSH levels at this time    Pneumonia - Chest x-ray reported left lower lobe infiltrate concerning for pneumonia - Patient is currently on Maxipime and vancomycin - I will also obtain urine Legionella/strep antigen (strep urinary antigen negative) - Sputum culture pending - Lactic acid levels within normal limits and reassuring.    Acute respiratory failure - Secondary to pneumonia and primary problem - Please see above plans - Continue to monitor in ICU   Code Status: presumed full Family Communication: No family at bedside. Disposition Plan: With improvement in blood pressure we'll plan on transitioning to floor. We'll  plan on transitioning once patient is 24 hours without hypotension   Consultants:  None  Procedures:  None  Antibiotics:  Cefepime  Vancomycin  HPI/Subjective: Patient states he feels better today. No new complaints  Objective: Filed Vitals:   01/25/13 0900  BP: 104/49  Pulse: 100  Temp:   Resp: 19    Intake/Output Summary (Last 24 hours) at 01/25/13 1010 Last data filed at 01/25/13 0800  Gross per 24 hour  Intake   1200 ml  Output   1250 ml  Net    -50 ml   Filed Weights   01/23/13 1500 01/24/13 0400 01/25/13 0400  Weight: 64.3 kg (141 lb 12.1 oz) 62.4 kg (137 lb 9.1 oz) 61.4 kg (135 lb 5.8 oz)    Exam:   General:  Pt Alert and Awake, limited responses to examiner, in NAD  Cardiovascular: irregularly irregular, no murmurs  Respiratory: Rhales at bases, no wheezes, breath sounds BL  Abdomen: soft, NT, ND  Musculoskeletal: moves extremities equally   Data Reviewed: Basic Metabolic Panel:  Recent Labs Lab 01/23/13 1100 01/24/13 0354 01/24/13 1033 01/25/13 0341  NA 140 137  --  138  K 4.4 3.9  --  4.2  CL 103 103  --  103  CO2 23 22  --  23  GLUCOSE 124* 118*  --  123*  BUN 12 10  --  12  CREATININE 0.71 0.75  --  0.68  CALCIUM 9.5 8.9  --  9.1  MG  --   --  1.9  --    Liver Function Tests:  Recent Labs Lab 01/23/13 1100  AST 21  ALT 16  ALKPHOS 63  BILITOT 0.7  PROT  7.4  ALBUMIN 3.5    Recent Labs Lab 01/23/13 1100  LIPASE 9*   No results found for this basename: AMMONIA,  in the last 168 hours CBC:  Recent Labs Lab 01/23/13 1100 01/24/13 0354 01/25/13 0341  WBC 12.9* 14.0* 10.5  NEUTROABS 9.4*  --   --   HGB 12.4* 12.1* 12.7*  HCT 36.6* 35.0* 36.9*  MCV 93.8 94.6 94.1  PLT 195 193 175   Cardiac Enzymes: No results found for this basename: CKTOTAL, CKMB, CKMBINDEX, TROPONINI,  in the last 168 hours BNP (last 3 results)  Recent Labs  01/23/13 1653  PROBNP 1890.0*   CBG: No results found for this basename:  GLUCAP,  in the last 168 hours  Recent Results (from the past 240 hour(s))  CULTURE, BLOOD (ROUTINE X 2)     Status: None   Collection Time    01/23/13 11:15 AM      Result Value Range Status   Specimen Description BLOOD RIGHT WRIST  2 ML IN [facility] BOTTLE   Final   Special Requests NONE   Final   Culture  Setup Time     Final   Value: 01/23/2013 18:10     Performed at [facility]   Culture     Final   Value:        BLOOD CULTURE RECEIVED NO GROWTH TO DATE CULTURE WILL BE HELD FOR 5 DAYS BEFORE ISSUING A FINAL NEGATIVE REPORT     Performed at [facility]   Report Status PENDING   Incomplete  CULTURE, BLOOD (ROUTINE X 2)     Status: None   Collection Time    01/23/13 11:50 AM      Result Value Range Status   Specimen Description BLOOD RIGHT THUMB   Final   Special Requests BOTTLES DRAWN AEROBIC ONLY 3CC   Final   Culture  Setup Time     Final   Value: 01/23/2013 18:11     Performed at [facility]   Culture     Final   Value:        BLOOD CULTURE RECEIVED NO GROWTH TO DATE CULTURE WILL BE HELD FOR 5 DAYS BEFORE ISSUING A FINAL NEGATIVE REPORT     Performed at [facility]   Report Status PENDING   Incomplete  URINE CULTURE     Status: None   Collection Time    01/23/13 12:41 PM      Result Value Range Status   Specimen Description URINE, CLEAN CATCH   Final   Special Requests NONE   Final   Culture  Setup Time     Final   Value: 01/23/2013 20:04     Performed at [place]     Final   Value: 5,000 COLONIES/ML     Performed at [facility]   Culture     Final   Value: INSIGNIFICANT GROWTH     Performed at [facility]   Report Status 01/24/2013 FINAL   Final  MRSA PCR SCREENING     Status: None   Collection Time    01/23/13  3:17 PM      Result Value Range Status   MRSA by PCR NEGATIVE  NEGATIVE Final   Comment:            The GeneXpert MRSA Assay (FDA     approved for NASAL specimens     only),  is one  component of a     comprehensive MRSA colonization     surveillance program. It is not     intended to diagnose MRSA     infection nor to guide or     monitor treatment for     MRSA infections.     Studies: Dg Chest 2 View  01/23/2013   CLINICAL DATA:  Weakness, short of breath  EXAM: CHEST  2 VIEW  COMPARISON:  Prior chest x-ray 11/06/2012  FINDINGS: Left lower lobe infiltrate concerning for pneumonia. Cardiac and mediastinal contours are within normal limits. No pulmonary edema. No pneumothorax or large effusion. No acute osseous abnormality.  IMPRESSION: Left lower lobe infiltrate concerning for pneumonia.   Electronically Signed   By: Jacqulynn Cadet M.D.   On: 01/23/2013 12:26    Scheduled Meds: . antiseptic oral rinse  15 mL Mouth Rinse q12n4p  . atorvastatin  40 mg Oral q1800  . ceFEPime (MAXIPIME) IV  1 g Intravenous Q8H  . chlorhexidine  15 mL Mouth Rinse BID  . DULoxetine  60 mg Oral Daily  . folic acid  1 mg Oral Daily  . metoprolol tartrate  12.5 mg Oral BID  . mirtazapine  15 mg Oral QHS  . multivitamin with minerals  1 tablet Oral Daily  . paliperidone  6 mg Oral Daily  . QUEtiapine  50 mg Oral BID  . sodium chloride  3 mL Intravenous Q12H  . thiamine  100 mg Oral Daily  . topiramate  25 mg Oral QHS  . vancomycin  500 mg Intravenous Q12H  . warfarin  5 mg Oral ONCE-1800  . Warfarin - Pharmacist Dosing Inpatient   Does not apply q1800   Continuous Infusions: . dextrose 5 % and 0.45 % NaCl with KCl 20 mEq/L 50 mL/hr at 01/25/13 0800    Principal Problem:   Acute on chronic systolic heart failure Active Problems:   Atrial flutter   Tobacco abuse   HTN (hypertension)   Hypothyroidism   Pneumonia   Acute respiratory failure    Time spent: > 40 minutes of critical care time.    Velvet Bathe  [facility] Pager [phone]. If 7PM-7AM, please contact night-coverage at [email or web address], password [facility] 01/25/2013, 10:10 AM  LOS: 2 days

## 2013-01-25 NOTE — Progress Notes (Signed)
ANTICOAGULATION CONSULT NOTE - Follow Up Consult  Pharmacy Consult for Coumadin Indication: atrial flutter  Allergies  Allergen Reactions  . Oat Flour [Oat]     Unknown per MAR  . Other     Greens: unknown reaction per [facility]   Labs:  Recent Labs  01/23/13 1100 01/23/13 1653 01/24/13 0354 01/25/13 0341  HGB 12.4*  --  12.1* 12.7*  HCT 36.6*  --  35.0* 36.9*  PLT 195  --  193 175  LABPROT  --  21.7* 22.4* 22.9*  INR  --  1.96* 2.04* 2.10*  CREATININE 0.71  --  0.75 0.68    Assessment: 66 YOM presenting with flu-like symptoms (influenza PCR was negative). He has history of aflutter which he is on chronic warfarin therapy. Prior to admission dosing per [facility] MAR = warfarin 5mg  daily with 7.5mg  on ThSu. Per coumadin clinic notes on 12/19, dose = warfarin 5mg  daily except 7.5mg  on Sundays (dose changed from above on 10/17). INR near therapeutic on admission, Coumadin resumed  INR 2.10 today, CBC okay, no bleeding. No significant drug-drug interaction noted.   Goal of Therapy:  INR 2-3 Monitor platelets by anticoagulation protocol: Yes   Plan:   Coumadin 5mg  po x 1 tonight  Daily PT/INR  Pharmacy will f/u  Vanessa Taylor, PharmD, BCPS Pager: [phone] 9:44 AM Pharmacy #: [phone]

## 2013-01-25 NOTE — Progress Notes (Signed)
CARE MANAGEMENT NOTE 01/25/2013  Patient:  David [name], David [name]   Account Number:  [id number]  Date Initiated:  01/25/2013  Documentation initiated by:  DAVIS,RHONDA  Subjective/Objective Assessment:   sepsis and weakness     Action/Plan:   lives at [facility] in the ALF   Anticipated DC Date:  01/28/2013   Anticipated DC Plan:  ASSISTED LIVING / REST HOME  In-house referral  Clinical Social Worker         Choice offered to / List presented to:             Status of service:  In process, will continue to follow Medicare Important Message given?  NA - LOS <3 / Initial given by admissions (If response is "NO", the following Medicare IM given date fields will be blank) Date Medicare IM given:   Date Additional Medicare IM given:    Discharge Disposition:    Per UR Regulation:  Reviewed for med. necessity/level of care/duration of stay  If discussed at [place] of Stay Meetings, dates discussed:    Comments:  [id number]/Rhonda Eldridge Dace, BSN, [place] [phone] Chart Reviewed for discharge and hospital needs. Discharge needs at time of review:  None present will follow for needs. Review of patient progress due on [id number].

## 2013-01-26 DIAGNOSIS — F17200 Nicotine dependence, unspecified, uncomplicated: Secondary | ICD-10-CM

## 2013-01-26 DIAGNOSIS — I509 Heart failure, unspecified: Secondary | ICD-10-CM

## 2013-01-26 DIAGNOSIS — I5022 Chronic systolic (congestive) heart failure: Secondary | ICD-10-CM

## 2013-01-26 LAB — PROTIME-INR
INR: 2 — AB (ref 0.00–1.49)
Prothrombin Time: 22.1 seconds — ABNORMAL HIGH (ref 11.6–15.2)

## 2013-01-26 MED ORDER — FUROSEMIDE 10 MG/ML IJ SOLN
INTRAMUSCULAR | Status: AC
  Filled 2013-01-26: qty 8

## 2013-01-26 MED ORDER — HALOPERIDOL LACTATE 5 MG/ML IJ SOLN: 2.0000 mg | Freq: Four times a day (QID) | INTRAMUSCULAR | Status: DC | PRN

## 2013-01-26 MED ORDER — HALOPERIDOL LACTATE 5 MG/ML IJ SOLN
2.0000 mg | Freq: Once | INTRAMUSCULAR | Status: AC
  Administered 2013-01-26: 2 mg via INTRAVENOUS

## 2013-01-26 MED ORDER — AMOXICILLIN-POT CLAVULANATE 875-125 MG PO TABS
1.0000 | ORAL_TABLET | Freq: Two times a day (BID) | ORAL | Status: DC
  Administered 2013-01-26 – 2013-01-27 (×2): 1 via ORAL
  Filled 2013-01-26 (×3): qty 1

## 2013-01-26 MED ORDER — WARFARIN SODIUM 7.5 MG PO TABS
7.5000 mg | ORAL_TABLET | Freq: Once | ORAL | Status: AC
  Administered 2013-01-26: 7.5 mg via ORAL
  Filled 2013-01-26: qty 1

## 2013-01-26 MED ORDER — HALOPERIDOL LACTATE 5 MG/ML IJ SOLN
2.0000 mg | Freq: Once | INTRAMUSCULAR | Status: DC
  Filled 2013-01-26: qty 1

## 2013-01-26 MED ORDER — AZITHROMYCIN 500 MG PO TABS
500.0000 mg | ORAL_TABLET | Freq: Every day | ORAL | Status: DC
  Administered 2013-01-26 – 2013-01-27 (×2): 500 mg via ORAL
  Filled 2013-01-26 (×2): qty 1

## 2013-01-26 MED ORDER — ENSURE COMPLETE PO LIQD
237.0000 mL | Freq: Two times a day (BID) | ORAL | Status: DC
  Administered 2013-01-27: 10:00:00 237 mL via ORAL

## 2013-01-26 NOTE — Progress Notes (Signed)
[facility] PROGRESS NOTE  David [name] JEH:[id number] DOB: Apr 15, 1940 DOA: 01/23/2013 PCP: Provider Not In System  Assessment/Plan: Principal Problem:   Acute on chronic systolic heart failure -Currently euvolemic and Lasix on hold to secondary to soft blood pressures. - Patient is currently on room air and breathing comfortably - 2-D echocardiogram 09/20/2012 reported EF of 40-45%.  Active Problems:   Atrial flutter - Metoprolol dose decreased to 2 soft blood pressures heart rate has been relatively well controlled on this regimen with improved blood pressure may consider continuing home dose of. - Will continue Coumadin and metoprolol - Magnesium levels within normal limits at 1.9    Tobacco abuse - Continue to encourage cessation    HTN (hypertension) - Continue metoprolol at lower dose 12.5 mg  - Will continue to monitor blood pressures and suspect blood pressures well rise once patient is tolerating oral intake    Hypothyroidism - Will obtain TSH levels at this time    Pneumonia - Chest x-ray reported left lower lobe infiltrate concerning for pneumonia - Patient is currently on Maxipime and vancomycin -Urine Legionella/strep antigen negative - Sputum culture pending - Lactic acid levels within normal limits and reassuring. - Patient afebrile in the last 24 hours as such will change antibiotic regimen to augmentin and azithromycin  - Will reassess with blood cell count next a.m.     Acute respiratory failure - Secondary to pneumonia and primary problem - Please see above plans   Code Status: presumed full Family Communication: No family at bedside. Disposition Plan: Transition to floor and change IV antibiotics to oral antibiotics. Will advance diet to dysphagia 3   Consultants:  None  Procedures:  None  Antibiotics:  Cefepime  Vancomycin  HPI/Subjective: Patient states he feels better today. No new complaints reported  overnight  Objective: Filed Vitals:   01/26/13 0800  BP: 90/75  Pulse: 68  Temp: 98 F (36.7 C)  Resp:     Intake/Output Summary (Last 24 hours) at 01/26/13 1111 Last data filed at 01/26/13 1000  Gross per 24 hour  Intake   1035 ml  Output   1150 ml  Net   -115 ml   Filed Weights   01/24/13 0400 01/25/13 0400 01/26/13 0406  Weight: 62.4 kg (137 lb 9.1 oz) 61.4 kg (135 lb 5.8 oz) 61.7 kg (136 lb 0.4 oz)    Exam:   General:  Pt Alert and Awake, limited responses to examiner, in NAD  Cardiovascular: irregularly irregular, no murmurs  Respiratory: Rhales at bases, no wheezes, breath sounds BL  Abdomen: soft, NT, ND  Musculoskeletal: moves extremities equally   Data Reviewed: Basic Metabolic Panel:  Recent Labs Lab 01/23/13 1100 01/24/13 0354 01/24/13 1033 01/25/13 0341  NA 140 137  --  138  K 4.4 3.9  --  4.2  CL 103 103  --  103  CO2 23 22  --  23  GLUCOSE 124* 118*  --  123*  BUN 12 10  --  12  CREATININE 0.71 0.75  --  0.68  CALCIUM 9.5 8.9  --  9.1  MG  --   --  1.9  --    Liver Function Tests:  Recent Labs Lab 01/23/13 1100  AST 21  ALT 16  ALKPHOS 63  BILITOT 0.7  PROT 7.4  ALBUMIN 3.5    Recent Labs Lab 01/23/13 1100  LIPASE 9*   No results found for this basename: AMMONIA,  in the last 168 hours CBC:  Recent Labs Lab 01/23/13 1100 01/24/13 0354 01/25/13 0341  WBC 12.9* 14.0* 10.5  NEUTROABS 9.4*  --   --   HGB 12.4* 12.1* 12.7*  HCT 36.6* 35.0* 36.9*  MCV 93.8 94.6 94.1  PLT 195 193 175   Cardiac Enzymes: No results found for this basename: CKTOTAL, CKMB, CKMBINDEX, TROPONINI,  in the last 168 hours BNP (last 3 results)  Recent Labs  01/23/13 1653  PROBNP 1890.0*   CBG: No results found for this basename: GLUCAP,  in the last 168 hours  Recent Results (from the past 240 hour(s))  CULTURE, BLOOD (ROUTINE X 2)     Status: None   Collection Time    01/23/13 11:15 AM      Result Value Range Status   Specimen  Description BLOOD RIGHT WRIST  2 ML IN [facility] BOTTLE   Final   Special Requests NONE   Final   Culture  Setup Time     Final   Value: 01/23/2013 18:10     Performed at [facility]   Culture     Final   Value:        BLOOD CULTURE RECEIVED NO GROWTH TO DATE CULTURE WILL BE HELD FOR 5 DAYS BEFORE ISSUING A FINAL NEGATIVE REPORT     Performed at [facility]   Report Status PENDING   Incomplete  CULTURE, BLOOD (ROUTINE X 2)     Status: None   Collection Time    01/23/13 11:50 AM      Result Value Range Status   Specimen Description BLOOD RIGHT THUMB   Final   Special Requests BOTTLES DRAWN AEROBIC ONLY 3CC   Final   Culture  Setup Time     Final   Value: 01/23/2013 18:11     Performed at [facility]   Culture     Final   Value:        BLOOD CULTURE RECEIVED NO GROWTH TO DATE CULTURE WILL BE HELD FOR 5 DAYS BEFORE ISSUING A FINAL NEGATIVE REPORT     Performed at [facility]   Report Status PENDING   Incomplete  URINE CULTURE     Status: None   Collection Time    01/23/13 12:41 PM      Result Value Range Status   Specimen Description URINE, CLEAN CATCH   Final   Special Requests NONE   Final   Culture  Setup Time     Final   Value: 01/23/2013 20:04     Performed at [place]     Final   Value: 5,000 COLONIES/ML     Performed at [facility]   Culture     Final   Value: INSIGNIFICANT GROWTH     Performed at [facility]   Report Status 01/24/2013 FINAL   Final  MRSA PCR SCREENING     Status: None   Collection Time    01/23/13  3:17 PM      Result Value Range Status   MRSA by PCR NEGATIVE  NEGATIVE Final   Comment:            The GeneXpert MRSA Assay (FDA     approved for NASAL specimens     only), is one component of a     comprehensive MRSA colonization     surveillance program. It is not     intended to diagnose MRSA     infection nor  to guide or     monitor treatment for     MRSA infections.      Studies: No results found.  Scheduled Meds: . antiseptic oral rinse  15 mL Mouth Rinse q12n4p  . atorvastatin  40 mg Oral q1800  . ceFEPime (MAXIPIME) IV  1 g Intravenous Q8H  . chlorhexidine  15 mL Mouth Rinse BID  . DULoxetine  60 mg Oral Daily  . folic acid  1 mg Oral Daily  . metoprolol tartrate  12.5 mg Oral BID  . mirtazapine  15 mg Oral QHS  . multivitamin with minerals  1 tablet Oral Daily  . paliperidone  6 mg Oral Daily  . QUEtiapine  50 mg Oral BID  . sodium chloride  3 mL Intravenous Q12H  . thiamine  100 mg Oral Daily  . topiramate  25 mg Oral QHS  . vancomycin  500 mg Intravenous Q12H  . warfarin  7.5 mg Oral ONCE-1800  . Warfarin - Pharmacist Dosing Inpatient   Does not apply q1800   Continuous Infusions: . dextrose 5 % and 0.45 % NaCl with KCl 20 mEq/L 50 mL/hr at 01/26/13 0741    Principal Problem:   Acute on chronic systolic heart failure Active Problems:   Atrial flutter   Tobacco abuse   HTN (hypertension)   Hypothyroidism   Pneumonia   Acute respiratory failure    Time spent: > 35 minutes    Velvet Bathe  [facility] Pager [phone]. If 7PM-7AM, please contact night-coverage at [email or web address], password [facility] 01/26/2013, 11:11 AM  LOS: 3 days

## 2013-01-26 NOTE — Progress Notes (Signed)
INITIAL NUTRITION ASSESSMENT  Pt meets criteria for severe MALNUTRITION in the context of chronic illness as evidenced by severe muscle wasting and subcutaneous fat loss in patellar region and severe muscle wasting in upper arms.  DOCUMENTATION CODES Per approved criteria  -Severe malnutrition in the context of chronic illness -Underweight   INTERVENTION: - Ensure Complete BID - Encouraged increased meal intake - Will continue to monitor   NUTRITION DIAGNOSIS: Increased nutrient needs related to underweight as evidenced by BMI of 18.4 kg/(m^2).   Goal: Pt to consume >90% of meals/supplements  Monitor:  Weights, labs, intake  Reason for Assessment: Malnutrition screening tool   73 y.o. male  Admitting Dx: Acute on chronic systolic heart failure  ASSESSMENT: Pt with history of COPD, a flutter on chronic anticoagulation, ischemic cardiomyopathy with decreased ejection fraction who resides in a skilled nursing facility and brought in after 3 days of increasing weakness plus cough plus reported fever. Pt with sitter in room.   Met with pt who reports not eating anything for 2 days PTA. Before then he was eating well. Denies any problems chewing or swallowing. Pt's weight down 5 pounds in the past month. Pt ate 75% of lunch.   Nutrition Focused Physical Exam:  Subcutaneous Fat:  Orbital Region: WNL Upper Arm Region: severe wasting Thoracic and Lumbar Region: mild/moderate wasting  Muscle:  Temple Region: WNL Clavicle Bone Region: mild/moderate wasting Clavicle and Acromion Bone Region: mild/moderate wasting Scapular Bone Region: NA Dorsal Hand: mild/moderate wasting Patellar Region: severe wasting Anterior Thigh Region: NA Posterior Calf Region: NA  Edema: None noted    Height: Ht Readings from Last 1 Encounters:  01/23/13 6' (1.829 m)    Weight: Wt Readings from Last 1 Encounters:  01/26/13 136 lb 0.4 oz (61.7 kg)    Ideal Body Weight: 178 lb   % Ideal  Body Weight: 76%  Wt Readings from Last 10 Encounters:  01/26/13 136 lb 0.4 oz (61.7 kg)  12/23/12 141 lb (63.957 kg)  11/06/12 136 lb (61.689 kg)  10/02/12 132 lb (59.875 kg)  09/25/12 132 lb 14.4 oz (60.283 kg)  04/06/12 142 lb (64.411 kg)  12/30/11 142 lb 11.2 oz (64.728 kg)  08/21/11 153 lb (69.4 kg)  07/02/11 153 lb (69.4 kg)  04/11/11 153 lb 6.4 oz (69.582 kg)    Usual Body Weight: 141 lb last month  % Usual Body Weight: 96%  BMI:  Body mass index is 18.44 kg/(m^2). Underweight  Estimated Nutritional Needs: Kcal: 2000-2200 Protein: 95-115g Fluid: 2-2.2L/day  Skin: intact   Diet Order: Dysphagia 3, thin   EDUCATION NEEDS: -No education needs identified at this time   Intake/Output Summary (Last 24 hours) at 01/26/13 1347 Last data filed at 01/26/13 1221  Gross per 24 hour  Intake   1375 ml  Output   1150 ml  Net    225 ml    Last BM: PTA  Labs:   Recent Labs Lab 01/23/13 1100 01/24/13 0354 01/24/13 1033 01/25/13 0341  NA 140 137  --  138  K 4.4 3.9  --  4.2  CL 103 103  --  103  CO2 23 22  --  23  BUN 12 10  --  12  CREATININE 0.71 0.75  --  0.68  CALCIUM 9.5 8.9  --  9.1  MG  --   --  1.9  --   GLUCOSE 124* 118*  --  123*    CBG (last 3)  No results found  for this basename: GLUCAP,  in the last 72 hours  Scheduled Meds: . amoxicillin-clavulanate  1 tablet Oral Q12H  . antiseptic oral rinse  15 mL Mouth Rinse q12n4p  . atorvastatin  40 mg Oral q1800  . azithromycin  500 mg Oral Daily  . chlorhexidine  15 mL Mouth Rinse BID  . DULoxetine  60 mg Oral Daily  . folic acid  1 mg Oral Daily  . haloperidol lactate  2 mg Intravenous Once  . metoprolol tartrate  12.5 mg Oral BID  . mirtazapine  15 mg Oral QHS  . multivitamin with minerals  1 tablet Oral Daily  . paliperidone  6 mg Oral Daily  . QUEtiapine  50 mg Oral BID  . sodium chloride  3 mL Intravenous Q12H  . thiamine  100 mg Oral Daily  . topiramate  25 mg Oral QHS  . warfarin   7.5 mg Oral ONCE-1800  . Warfarin - Pharmacist Dosing Inpatient   Does not apply q1800    Continuous Infusions: . dextrose 5 % and 0.45 % NaCl with KCl 20 mEq/L 50 mL/hr at 01/26/13 0741    Past Medical History  Diagnosis Date  . Community acquired pneumonia     Right middle lobe community-acquired pneumonia  . Toxic metabolic encephalopathy   . Chronic atrial flutter     a. rate controlled with bb therapy;  b. h/o bradycardia when on digoxin 12/2011.  . Tobacco abuse   . History of coronary artery disease 2008    a. Cath at [facility]: RCA 95%, RI 60%  - medical therapy  . Ischemic cardiomyopathy     a. prev documented EF of 20-30%;  b. 09/2012 Echo: EF 40-45%, diff HK, mild MR, mild bi-atrial enlargement.  . History of prostate cancer   . Chronic obstructive pulmonary disease     with mild bronchospasm, improved  . Hypertension   . History of alcohol abuse     History of prior heavy alcohol abuse  . History of depression   . Altered mental status 2012   . Noncompliance   . Depression   . Ulcer   . Hx of migraines   . Hyperlipidemia   . Colon cancer     Past Surgical History  Procedure Laterality Date  . Appendectomy      Mikey College MS, RD, LDN [phone] Pager [phone] After Hours Pager

## 2013-01-26 NOTE — Progress Notes (Signed)
ANTICOAGULATION CONSULT NOTE - Follow Up Consult  Pharmacy Consult for Coumadin Indication: atrial flutter  Allergies  Allergen Reactions  . Oat Flour [Oat]     Unknown per MAR  . Other     Greens: unknown reaction per [facility]   Labs:  Recent Labs  01/23/13 1100  01/24/13 0354 01/25/13 0341 01/26/13 0410  HGB 12.4*  --  12.1* 12.7*  --   HCT 36.6*  --  35.0* 36.9*  --   PLT 195  --  193 175  --   LABPROT  --   < > 22.4* 22.9* 22.1*  INR  --   < > 2.04* 2.10* 2.00*  CREATININE 0.71  --  0.75 0.68  --   < > = values in this interval not displayed.  Assessment: 81 YOM presenting with flu-like symptoms (influenza PCR was negative). He has history of aflutter which he is on chronic warfarin therapy. Prior to admission dosing per [facility] MAR = warfarin 5mg  daily with 7.5mg  on ThSu. Per coumadin clinic notes on 12/19, dose = warfarin 5mg  daily except 7.5mg  on Sundays (dose changed from above on 10/17). INR near therapeutic on admission, Coumadin resumed  INR 2.0 today, no CBC, no bleeding. No significant drug-drug interaction noted.   Goal of Therapy:  INR 2-3 Monitor platelets by anticoagulation protocol: Yes   Plan:   Coumadin 7.5mg  po x 1 tonight  Daily PT/INR  Pharmacy will f/u  Vanessa Churchill, PharmD, BCPS Pager: [phone] 10:38 AM Pharmacy #: [id number]

## 2013-01-27 DIAGNOSIS — E43 Unspecified severe protein-calorie malnutrition: Secondary | ICD-10-CM

## 2013-01-27 DIAGNOSIS — I2589 Other forms of chronic ischemic heart disease: Secondary | ICD-10-CM

## 2013-01-27 LAB — CBC
HEMATOCRIT: 39.9 % (ref 39.0–52.0)
Hemoglobin: 13.6 g/dL (ref 13.0–17.0)
MCH: 31.7 pg (ref 26.0–34.0)
MCHC: 34.1 g/dL (ref 30.0–36.0)
MCV: 93 fL (ref 78.0–100.0)
Platelets: 216 10*3/uL (ref 150–400)
RBC: 4.29 MIL/uL (ref 4.22–5.81)
RDW: 12.9 % (ref 11.5–15.5)
WBC: 7.4 10*3/uL (ref 4.0–10.5)

## 2013-01-27 LAB — PROTIME-INR
INR: 2.02 — ABNORMAL HIGH (ref 0.00–1.49)
Prothrombin Time: 22.2 seconds — ABNORMAL HIGH (ref 11.6–15.2)

## 2013-01-27 MED ORDER — BENZONATATE 100 MG PO CAPS: 100.0000 mg | ORAL_CAPSULE | Freq: Two times a day (BID) | ORAL | Status: DC | PRN

## 2013-01-27 MED ORDER — ENSURE COMPLETE PO LIQD: 237.0000 mL | Freq: Two times a day (BID) | ORAL | Status: DC

## 2013-01-27 MED ORDER — METOPROLOL TARTRATE 12.5 MG HALF TABLET: 12.5000 mg | ORAL_TABLET | Freq: Two times a day (BID) | ORAL | Status: DC

## 2013-01-27 MED ORDER — AMOXICILLIN-POT CLAVULANATE 875-125 MG PO TABS: 1.0000 | ORAL_TABLET | Freq: Two times a day (BID) | ORAL | Status: DC

## 2013-01-27 MED ORDER — WARFARIN SODIUM 5 MG PO TABS
5.0000 mg | ORAL_TABLET | Freq: Once | ORAL | Status: AC
  Administered 2013-01-27: 18:00:00 5 mg via ORAL
  Filled 2013-01-27: qty 1

## 2013-01-27 MED ORDER — AZITHROMYCIN 500 MG PO TABS: 500.0000 mg | ORAL_TABLET | Freq: Every day | ORAL | Status: DC

## 2013-01-27 NOTE — Progress Notes (Signed)
Speech Language Pathology Treatment: Dysphagia  Patient Details Name: David [name] MRN: [id number] DOB: [birth year] Today's Date: 01/27/2013 Time: 8241-7530 SLP Time Calculation (min): 10 min  Assessment / Plan / Recommendation Clinical Impression  Pt with improved mentation today and is scheduled for discharge.  SLP observed pt consuming tea via straw, no increased work of breathing or overt coughing with po and pt not conducting chin tuck.  Reviewed previous MBS with pt (from last fall) and indication for chin tuck, intermittent throat clear - pt states "I don't think much of that" - indicating displeasure with chin tuck.   Pt intake when diet advanced appeared good per documentation and he was npo for several days during admit due to mental status, therefore do not suspect modified diet contributing to poor intake.   All education completed with pt re: swallow compensations to mitigate aspiration.  SLP to sign off.  Thanks.    HPI HPI: Past medical history of COPD, a flutter on chronic anticoagulation, ischemic cardiomyopathy with decreased ejection fraction who resides in a skilled nursing facility and brought in after 3 days of increasing weakness plus cough plus reported fever. He was noted to be hypoxic requiring 2 L of oxygen at his baseline is no oxygen. In the emergency room, patient was noted to have a white blood cell count of 12.9, negative flu titer and noted hypoxia. He was started on broad-spectrum antibiotics.  Pt CXR concerning for left lobe pna and pt has h/o recurrent pnas.   Pt diet was initiated yesterday and MD advanced diet due to poor intake.     Pertinent Vitals Afebrile, decreased-congested cough  SLP Plan  All goals met    Recommendations Diet recommendations: Regular;Thin liquid (per md advanced to regular) Liquids provided via: Straw;Cup Medication Administration: Whole meds with puree Supervision: Patient able to self feed;Intermittent supervision to cue for  compensatory strategies Compensations: Slow rate;Small sips/bites;Clear throat intermittently Postural Changes and/or Swallow Maneuvers: Chin tuck (as pt willing)              Oral Care Recommendations: Oral care BID Follow up Recommendations: Skilled Nursing facility Plan: All goals met    [place], [facility] SLP [phone]

## 2013-01-27 NOTE — Discharge Summary (Addendum)
Physician Discharge Summary  Quashon Hubbard [id number] DOB: 04/26/1940 DOA: 01/23/2013  PCP: Provider Not In System  Admit date: 01/23/2013 Discharge date: 01/27/2013  Time spent: > 35 minutes  Recommendations for Outpatient Follow-up:  1. Patient will need his INR rechecked 2. I have decreased metoprolol dose to 12.5 mg by mouth twice a day secondary to soft blood pressures initially. Heart rate well controlled on this regimen 3. Will discharge on Augmentin and azithromycin for presumed lung infection 4. Patient to continue physical therapy at assisted living facility 5. Also recommend nutritional supplementation which I will provide prescription for Ensure on discharge 6. Held Lasix secondary to soft blood pressures. Blood pressures improved and patient respiratory condition currently stable off of Lasix. Please evaluate patient and consider restarting Lasix if appropriate.  Discharge Diagnoses:  Principal Problem:   Acute on chronic systolic heart failure Active Problems:   Atrial flutter   Tobacco abuse   HTN (hypertension)   Hypothyroidism   Pneumonia   Acute respiratory failure   Protein-calorie malnutrition, severe   Discharge Condition: Stable  Diet recommendation: dysphagia 3 diet. Given reports of malnutrition would liberalize diet with paying particular attention to weight of patient.  Filed Weights   01/25/13 0400 01/26/13 0406 01/27/13 0515  Weight: 61.4 kg (135 lb 5.8 oz) 61.7 kg (136 lb 0.4 oz) 61.9 kg (136 lb 7.4 oz)    History of present illness:  From original history of present illness Daquavion Cavett is a 73 y.o. male  Past medical history of COPD, a flutter on chronic anticoagulation, ischemic cardiomyopathy with decreased ejection fraction who resides in a skilled nursing facility and brought in after 3 days of increasing weakness plus cough plus reported fever. He was noted to be hypoxic requiring 2 L of oxygen at his baseline is no oxygen   Hospital  Course:  Principal Problem :  Acute on chronic systolic heart failure  -Patient is euvolemic and currently compensated. Lasix on hold to 2 soft blood pressures we'll set up appointment with primary care physician in order to decide when to resume Lasix. - Would recommend waiting patient daily at assisted living facility. If patient gains 3 pounds from one day to them next please call primary care physician for further recommendations - Patient is currently on room air and breathing comfortably  - 2-D echocardiogram 09/20/2012 reported EF of 40-45%.  Active Problems:   Atrial flutter  - Metoprolol dose decreased secondary to soft blood pressures heart rate has been relatively well controlled on this regimen. We'll continue on discharge - Will continue Coumadin at home dose  - Magnesium levels within normal limits at 1.9   Tobacco abuse  - Continue to encourage cessation   HTN (hypertension)  - Continue metoprolol at lower dose 12.5 mg  - Blood pressures improved with improvement in oral intake   Hypothyroidism  - Will obtain TSH levels at this time   Pneumonia  - Chest x-ray reported left lower lobe infiltrate concerning for pneumonia  - Patient is currently on Maxipime and vancomycin  -Urine Legionella/strep antigen negative  - Sputum culture unavailable - Lactic acid levels within normal limits and reassuring.  - Currently tolerating augmentin and azithromycin. Patient to receive 8 days of antibiotic therapy with Augmentin and will give 2 more days of azithromycin  - Leukocytosis remains resolved on above oral regimen  Acute respiratory failure  - Resolved and was most likely multifactorial please see above  Addendum: Protein calorie malnutrition - Pt meets criteria for  severe MALNUTRITION in the context of chronic illness as evidenced by severe muscle wasting and subcutaneous fat loss in patellar region and severe muscle wasting in upper  arms  Procedures: None  Consultations:  None  Discharge Exam: Filed Vitals:   01/27/13 1404  BP: 111/62  Pulse: 71  Temp: 98.7 F (37.1 C)  Resp: 20    General: Pt in NAD, Alert and awake. Appears comfortable Cardiovascular: Addendum: irregularly irregular, no MRG Respiratory: CTA BL, no wheezes, breathing comfortably on room air, no increased work of breathing  Discharge Instructions  Discharge Orders   Future Appointments Provider Department Dept Phone   01/29/2013 9:15 AM [facility] [place] [facility] [phone]   02/03/2013 9:30 AM Chesley Mires, MD [facility] [phone]   Future Orders Complete By Expires   (HEART FAILURE PATIENTS) Call MD:  Anytime you have any of the following symptoms: 1) 3 pound weight gain in 24 hours or 5 pounds in 1 week 2) shortness of breath, with or without a dry hacking cough 3) swelling in the hands, feet or stomach 4) if you have to sleep on extra pillows at night in order to breathe.  As directed    Call MD for:  difficulty breathing, headache or visual disturbances  As directed    Call MD for:  temperature >100.4  As directed    Diet - low sodium heart healthy  As directed    Discharge instructions  As directed    Comments:     Patient's discharge to assisted living facility with physical therapy. Also to continue supplemental nutrition. Recommend patient continue routine INR monitoring for ALF protocol or if none to be drawn within the next 2 or 3 days and reported to primary care physician   Increase activity slowly  As directed        Medication List    STOP taking these medications       irbesartan 75 MG tablet  Commonly known as:  AVAPRO     lactulose 10 GM/15ML solution  Commonly known as:  CHRONULAC     meloxicam 7.5 MG tablet  Commonly known as:  MOBIC      TAKE these medications       albuterol 108 (90 BASE) MCG/ACT inhaler  Commonly known as:  PROVENTIL HFA;VENTOLIN HFA   Inhale 2 puffs into the lungs every 6 (six) hours as needed.     amoxicillin-clavulanate 875-125 MG per tablet  Commonly known as:  AUGMENTIN  Take 1 tablet by mouth every 12 (twelve) hours.     azithromycin 500 MG tablet  Commonly known as:  ZITHROMAX  Take 1 tablet (500 mg total) by mouth daily.     benzonatate 100 MG capsule  Commonly known as:  TESSALON  Take 1 capsule (100 mg total) by mouth 2 (two) times daily as needed for cough.     DULoxetine 60 MG capsule  Commonly known as:  CYMBALTA  Take 60 mg by mouth daily.     feeding supplement (ENSURE COMPLETE) Liqd  Take 237 mLs by mouth 2 (two) times daily between meals.     folic acid 1 MG tablet  Commonly known as:  FOLVITE  Take 1 tablet (1 mg total) by mouth daily.     guaiFENesin 600 MG 12 hr tablet  Commonly known as:  MUCINEX  Take 1 tablet (600 mg total) by mouth 2 (two) times daily.     metoprolol tartrate 12.5 mg Tabs  tablet  Commonly known as:  LOPRESSOR  Take 0.5 tablets (12.5 mg total) by mouth 2 (two) times daily.     mirtazapine 15 MG tablet  Commonly known as:  REMERON  Take 15 mg by mouth at bedtime.     multivitamin tablet  Take 1 tablet by mouth daily.     paliperidone 6 MG 24 hr tablet  Commonly known as:  INVEGA  Take 6 mg by mouth every morning.     QUEtiapine 50 MG tablet  Commonly known as:  SEROQUEL  Take 50 mg by mouth 2 (two) times daily.     rosuvastatin 20 MG tablet  Commonly known as:  CRESTOR  Take 20 mg by mouth daily.     SENNA PLUS PO  Take 1 tablet by mouth 2 (two) times daily as needed (constipation).     thiamine 100 MG tablet  Commonly known as:  VITAMIN B-1  Take 100 mg by mouth daily.     tiotropium 18 MCG inhalation capsule  Commonly known as:  SPIRIVA  Place 18 mcg into inhaler and inhale daily.     topiramate 25 MG tablet  Commonly known as:  TOPAMAX  Take 25 mg by mouth at bedtime.     warfarin 5 MG tablet  Commonly known as:  COUMADIN  Take 5-7.5 mg  by mouth daily. Take 5mg  daily except on Sundays take 7.5mg        Allergies  Allergen Reactions  . Oat Flour [Oat]     Unknown per MAR  . Other     Greens: unknown reaction per [facility]      The results of significant diagnostics from this hospitalization (including imaging, microbiology, ancillary and laboratory) are listed below for reference.    Significant Diagnostic Studies: Dg Chest 2 View  01/23/2013   CLINICAL DATA:  Weakness, short of breath  EXAM: CHEST  2 VIEW  COMPARISON:  Prior chest x-ray 11/06/2012  FINDINGS: Left lower lobe infiltrate concerning for pneumonia. Cardiac and mediastinal contours are within normal limits. No pulmonary edema. No pneumothorax or large effusion. No acute osseous abnormality.  IMPRESSION: Left lower lobe infiltrate concerning for pneumonia.   Electronically Signed   By: Jacqulynn Cadet M.D.   On: 01/23/2013 12:26    Microbiology: Recent Results (from the past 240 hour(s))  CULTURE, BLOOD (ROUTINE X 2)     Status: None   Collection Time    01/23/13 11:15 AM      Result Value Range Status   Specimen Description BLOOD RIGHT WRIST  2 ML IN [facility] BOTTLE   Final   Special Requests NONE   Final   Culture  Setup Time     Final   Value: 01/23/2013 18:10     Performed at [facility]   Culture     Final   Value:        BLOOD CULTURE RECEIVED NO GROWTH TO DATE CULTURE WILL BE HELD FOR 5 DAYS BEFORE ISSUING A FINAL NEGATIVE REPORT     Performed at [facility]   Report Status PENDING   Incomplete  CULTURE, BLOOD (ROUTINE X 2)     Status: None   Collection Time    01/23/13 11:50 AM      Result Value Range Status   Specimen Description BLOOD RIGHT THUMB   Final   Special Requests BOTTLES DRAWN AEROBIC ONLY 3CC   Final   Culture  Setup Time     Final  Value: 01/23/2013 18:11     Performed at [facility]   Culture     Final   Value:        BLOOD CULTURE RECEIVED NO GROWTH TO DATE CULTURE WILL BE HELD FOR 5 DAYS BEFORE  ISSUING A FINAL NEGATIVE REPORT     Performed at [facility]   Report Status PENDING   Incomplete  URINE CULTURE     Status: None   Collection Time    01/23/13 12:41 PM      Result Value Range Status   Specimen Description URINE, CLEAN CATCH   Final   Special Requests NONE   Final   Culture  Setup Time     Final   Value: 01/23/2013 20:04     Performed at [place]     Final   Value: 5,000 COLONIES/ML     Performed at [facility]   Culture     Final   Value: INSIGNIFICANT GROWTH     Performed at [facility]   Report Status 01/24/2013 FINAL   Final  MRSA PCR SCREENING     Status: None   Collection Time    01/23/13  3:17 PM      Result Value Range Status   MRSA by PCR NEGATIVE  NEGATIVE Final   Comment:            The GeneXpert MRSA Assay (FDA     approved for NASAL specimens     only), is one component of a     comprehensive MRSA colonization     surveillance program. It is not     intended to diagnose MRSA     infection nor to guide or     monitor treatment for     MRSA infections.     Labs: Basic Metabolic Panel:  Recent Labs Lab 01/23/13 1100 01/24/13 0354 01/24/13 1033 01/25/13 0341  NA 140 137  --  138  K 4.4 3.9  --  4.2  CL 103 103  --  103  CO2 23 22  --  23  GLUCOSE 124* 118*  --  123*  BUN 12 10  --  12  CREATININE 0.71 0.75  --  0.68  CALCIUM 9.5 8.9  --  9.1  MG  --   --  1.9  --    Liver Function Tests:  Recent Labs Lab 01/23/13 1100  AST 21  ALT 16  ALKPHOS 63  BILITOT 0.7  PROT 7.4  ALBUMIN 3.5    Recent Labs Lab 01/23/13 1100  LIPASE 9*   No results found for this basename: AMMONIA,  in the last 168 hours CBC:  Recent Labs Lab 01/23/13 1100 01/24/13 0354 01/25/13 0341 01/27/13 0505  WBC 12.9* 14.0* 10.5 7.4  NEUTROABS 9.4*  --   --   --   HGB 12.4* 12.1* 12.7* 13.6  HCT 36.6* 35.0* 36.9* 39.9  MCV 93.8 94.6 94.1 93.0  PLT 195 193 175 216   Cardiac Enzymes: No  results found for this basename: CKTOTAL, CKMB, CKMBINDEX, TROPONINI,  in the last 168 hours BNP: BNP (last 3 results)  Recent Labs  01/23/13 1653  PROBNP 1890.0*   CBG: No results found for this basename: GLUCAP,  in the last 168 hours     Signed:  Velvet Bathe  [facility] 01/27/2013, 2:24 PM

## 2013-01-27 NOTE — Progress Notes (Signed)
Report called to [facility]. ems called for transport

## 2013-01-27 NOTE — Evaluation (Signed)
Physical Therapy Evaluation Patient Details Name: David [name] MRN: [id number] DOB: Feb 01, 1940 Today's Date: 01/27/2013 Time: 5631-4970 PT Time Calculation (min): 16 min  PT Assessment / Plan / Recommendation History of Present Illness  73 yo male admitted with heart failure, fever, weakness, A flutter. Pt is from ALF.  Clinical Impression  On eval, pt required Min assist for mobility-able to ambulate ~100 feet with walker. Recommend HHPT at ALF as long as facility is able to provide current level of assist. If not, then may need SNF.     PT Assessment  Patient needs continued PT services    Follow Up Recommendations  Home health PT;Supervision/Assistance - 24 hour (at ALF as long as facility can provide current level of care)    Does the patient have the potential to tolerate intense rehabilitation      Barriers to Discharge        Equipment Recommendations  Rolling walker with 5" wheels    Recommendations for Other Services     Frequency Min 3X/week    Precautions / Restrictions Precautions Precautions: Fall Restrictions Weight Bearing Restrictions: No   Pertinent Vitals/Pain No c/o pain      Mobility  Bed Mobility:  Supine to/from Sit: Min assist.  Assist for trunk and bil LEs. Increased time.  Transfers:  Sit to/from Stand: Min assist Assist to rise, stabilize, control descent. VCs safety, hand placement Ambulation/Gait: Min assist Ambulation Distance (Feet): 100 Feet General Gait Details: Assist to stabilize throughout ambulation. VCs safety, and for pt to increase step length. slow gait speed.  Used RW.    Exercises     PT Diagnosis: Generalized weakness;Difficulty walking;Abnormality of gait  PT Problem List: Decreased strength;Decreased balance;Decreased mobility;Decreased activity tolerance;Decreased cognition PT Treatment Interventions: DME instruction;Gait training;Functional mobility training;Therapeutic activities;Therapeutic exercise;Patient/family  education;Balance training     PT Goals(Current goals can be found in the care plan section) Acute Rehab PT Goals Patient Stated Goal: back to bed PT Goal Formulation: Patient unable to participate in goal setting Time For Goal Achievement: 02/10/13 Potential to Achieve Goals: Good  Visit Information  Last PT Received On: 01/27/13 Assistance Needed: +1 History of Present Illness: 73 yo male admitted with heart failure, fever, weakness, A flutter. Pt is from ALF.       Prior Functioning  Home Living Family/patient expects to be discharged to:: Assisted living Home Equipment: None Additional Comments: pt questionable historian Prior Function Level of Independence: Independent Comments: pt reports not using a RW or cane however unsure Communication Communication: No difficulties    Cognition  Cognition Arousal/Alertness: Awake/alert Behavior During Therapy: Flat affect Overall Cognitive Status: Difficult to assess    Extremity/Trunk Assessment Upper Extremity Assessment Upper Extremity Assessment: Generalized weakness Lower Extremity Assessment Lower Extremity Assessment: Generalized weakness Cervical / Trunk Assessment Cervical / Trunk Assessment: Kyphotic   Balance    End of Session PT - End of Session Equipment Utilized During Treatment: Gait belt Activity Tolerance: Patient tolerated treatment well Patient left: in bed;with call bell/phone within reach Nurse Communication: Mobility status  GP     Weston Anna, MPT Pager: [phone]

## 2013-01-27 NOTE — Progress Notes (Signed)
Spoke rep at ALF about HHPT for pt. ALF will setup HHPT.

## 2013-01-27 NOTE — Progress Notes (Signed)
ANTICOAGULATION CONSULT NOTE - Follow Up Consult  Pharmacy Consult for Coumadin Indication: atrial flutter  Allergies  Allergen Reactions  . Oat Flour [Oat]     Unknown per MAR  . Other     Greens: unknown reaction per [facility]   Labs:  Recent Labs  01/25/13 0341 01/26/13 0410 01/27/13 0505  HGB 12.7*  --  13.6  HCT 36.9*  --  39.9  PLT 175  --  216  LABPROT 22.9* 22.1* 22.2*  INR 2.10* 2.00* 2.02*  CREATININE 0.68  --   --     Assessment: 12 YOM presenting with flu-like symptoms (influenza PCR was negative). He has history of aflutter which he is on chronic warfarin therapy. Prior to admission dosing per [facility] MAR = warfarin 5mg  daily with 7.5mg  on ThSu. Per coumadin clinic notes on 12/19, warfarin dose was adjusted to 5mg  daily except 7.5mg  on Sundays.  INR near therapeutic on admission, Coumadin resumed  INR 2.02 today  CBC WNL  Noted potential drug interaction with azithromycin.  Goal of Therapy:  INR 2-3 Monitor platelets by anticoagulation protocol: Yes   Plan:   Coumadin 5mg  po x 1 tonight  Daily PT/INR  Hershal Coria, PharmD, BCPS Pager: [phone] 01/27/2013 12:20 PM

## 2013-01-29 LAB — CULTURE, BLOOD (ROUTINE X 2)
Culture: NO GROWTH
Culture: NO GROWTH

## 2013-02-03 ENCOUNTER — Ambulatory Visit: Payer: PRIVATE HEALTH INSURANCE | Admitting: Pulmonary Disease

## 2013-03-05 ENCOUNTER — Ambulatory Visit (INDEPENDENT_AMBULATORY_CARE_PROVIDER_SITE_OTHER): Payer: Medicare Other | Admitting: Pulmonary Disease

## 2013-03-05 ENCOUNTER — Ambulatory Visit (INDEPENDENT_AMBULATORY_CARE_PROVIDER_SITE_OTHER)
Admission: RE | Admit: 2013-03-05 | Discharge: 2013-03-05 | Disposition: A | Discharge status: Home / Self Care | Payer: Medicare Other | Source: Ambulatory Visit | Attending: Pulmonary Disease | Admitting: Pulmonary Disease

## 2013-03-05 ENCOUNTER — Telehealth: Payer: Self-pay | Admitting: Pulmonary Disease

## 2013-03-05 ENCOUNTER — Encounter: Payer: Self-pay | Admitting: Pulmonary Disease

## 2013-03-05 VITALS — BP 112/66 | HR 70 | Temp 97.9°F | Ht 72.0 in | Wt 147.0 lb

## 2013-03-05 DIAGNOSIS — J439 Emphysema, unspecified: Secondary | ICD-10-CM

## 2013-03-05 DIAGNOSIS — J438 Other emphysema: Secondary | ICD-10-CM

## 2013-03-05 DIAGNOSIS — F17200 Nicotine dependence, unspecified, uncomplicated: Secondary | ICD-10-CM

## 2013-03-05 DIAGNOSIS — J189 Pneumonia, unspecified organism: Secondary | ICD-10-CM

## 2013-03-05 DIAGNOSIS — Z720 Tobacco use: Secondary | ICD-10-CM

## 2013-03-05 NOTE — Progress Notes (Signed)
Chief Complaint  Patient presents with  . COPD    Breathing is unchanged. Reports coughing with production of yellow mucus. Denies SOB or chest tightness.    History of Present Illness: David [name] is a 73 y.o. male smoker with COPD/emphysema.    He was in hospital in January for heart failure and pneumonia.  He has been doing okay.  He gets occasional cough.  He uses spiriva daily.  He is not using albuterol much.  He denies hemoptysis, chest pain, or leg swelling.  He does okay with activity at slow pace.  He continues to smoke 8 cigarettes per day.  His appetite is good, but he has trouble keeping up his weight.  TESTS: Echo 09/20/12 >> EF 40 to 45%, mild MR Spirometry 11/06/12 >> FEV1 0.68 (22%), FEV1% 42%  [name]  has a past medical history of Community acquired pneumonia; Toxic metabolic encephalopathy; Chronic atrial flutter; Tobacco abuse; History of coronary artery disease (2008); Ischemic cardiomyopathy; History of prostate cancer; Chronic obstructive pulmonary disease; Hypertension; History of alcohol abuse; History of depression; Altered mental status (2012 ); Noncompliance; Depression; Ulcer; migraines; Hyperlipidemia; and Colon cancer.  [name]  has past surgical history that includes Appendectomy.  Prior to Admission medications   Medication Sig Start Date End Date Taking? Authorizing Provider  albuterol (PROVENTIL HFA;VENTOLIN HFA) 108 (90 BASE) MCG/ACT inhaler Inhale 2 puffs into the lungs every 6 (six) hours as needed.   Yes Historical Provider, MD  cefixime (SUPRAX) 400 MG tablet Take 1 tablet (400 mg total) by mouth daily. 09/25/12  Yes Charlynne Cousins, MD  DULoxetine (CYMBALTA) 60 MG capsule Take 60 mg by mouth daily.   Yes Historical Provider, MD  folic acid (FOLVITE) 1 MG tablet Take 1 tablet (1 mg total) by mouth daily. 12/31/11  Yes Marianne L York, PA-C  guaiFENesin (MUCINEX) 600 MG 12 hr tablet Take 1 tablet (600 mg total) by mouth 2 (two)  times daily. 12/31/11  Yes Marianne L York, PA-C  irbesartan (AVAPRO) 75 MG tablet Take 1 tablet (75 mg total) by mouth daily. 12/31/11  Yes Marianne L York, PA-C  lactulose (CHRONULAC) 10 GM/15ML solution Take 30 mLs (20 g total) by mouth daily. 12/31/11  Yes Marianne L York, PA-C  loratadine (CLARITIN) 10 MG tablet Take 10 mg by mouth daily.   Yes Historical Provider, MD  meloxicam (MOBIC) 7.5 MG tablet Take 7.5 mg by mouth daily.   Yes Historical Provider, MD  metoprolol tartrate (LOPRESSOR) 25 MG tablet Take 1 tablet (25 mg total) by mouth 2 (two) times daily. 12/31/11  Yes Marianne L York, PA-C  mirtazapine (REMERON) 15 MG tablet Take 15 mg by mouth at bedtime.   Yes Historical Provider, MD  Multiple Vitamin (MULTIVITAMIN) tablet Take 1 tablet by mouth daily.   Yes Historical Provider, MD  paliperidone (INVEGA) 6 MG 24 hr tablet Take 6 mg by mouth every morning.   Yes Historical Provider, MD  QUEtiapine (SEROQUEL) 50 MG tablet Take 50 mg by mouth 2 (two) times daily.   Yes Historical Provider, MD  QVAR 40 MCG/ACT inhaler Inhale 2 puffs into the lungs 2 (two) times daily.  01/02/12  Yes Historical Provider, MD  rosuvastatin (CRESTOR) 20 MG tablet Take 20 mg by mouth daily.   Yes Historical Provider, MD  Sennosides-Docusate Sodium (SENNA PLUS PO) Take 1 tablet by mouth 2 (two) times daily as needed (constipation).    Yes Historical Provider, MD  thiamine (VITAMIN B-1) 100 MG tablet  Take 100 mg by mouth daily.   Yes Historical Provider, MD  topiramate (TOPAMAX) 25 MG tablet Take 25 mg by mouth at bedtime.    Yes Historical Provider, MD  warfarin (COUMADIN) 5 MG tablet Take 1 tablet (5 mg total) by mouth daily. 12/31/11  Yes Marianne L York, PA-C  warfarin (COUMADIN) 7.5 MG tablet Take 7.5 mg by mouth as directed. Takes 7.5 mg on thurs and sundays then 5 mg the rest of the week   Yes Historical Provider, MD    Allergies  Allergen Reactions  . Oat Flour [Oat]     Unknown per MAR  . Other      Greens: unknown reaction per [facility]     Physical Exam:  General - No distress, thin ENT - No sinus tenderness, no oral exudate, no LAN, poor dentition Cardiac - s1s2 regular, no murmur Chest - Decreased breath sounds, prolonged exhalation, no wheeze Back - No focal tenderness Abd - Soft, non-tender Ext - No edema Neuro - Normal strength Skin - No rashes Psych - normal mood, and behavior  Assessment/Plan:  Chesley Mires, MD [facility] Pager:  [phone]

## 2013-03-05 NOTE — Assessment & Plan Note (Signed)
Stable on current inhaler regimen.  Will arrange for referral to pulmonary rehab.

## 2013-03-05 NOTE — Assessment & Plan Note (Signed)
Discussed importance of smoking cessation. 

## 2013-03-05 NOTE — Patient Instructions (Signed)
Chest xray today >> will call with results Will arrange for referral to pulmonary rehab Follow up in 6 months

## 2013-03-05 NOTE — Telephone Encounter (Signed)
Dg Chest 2 View  03/05/2013   CLINICAL DATA:  Followup pneumonia, cough, smoker, history coronary artery disease, ischemic cardiomyopathy, prostate cancer, emphysema/COPD, colon cancer  EXAM: CHEST  2 VIEW  COMPARISON:  01/23/2013  FINDINGS: Upper normal heart size.  Atherosclerotic calcification aorta.  Mediastinal contours and pulmonary vascularity normal.  Emphysematous changes with improved left lower lobe infiltrate versus previous study.  No new infiltrate, pleural effusion or pneumothorax.  No acute osseous abnormalities.  IMPRESSION: Emphysematous changes consistent with COPD.  Improved left lower lobe infiltrate since previous exam.   Electronically Signed   By: Lavonia Dana M.D.   On: 03/05/2013 15:13    Will have my nurse inform pt that CXR looks better compared to when he had pneumonia in January.  No change to current treatment plan.

## 2013-03-05 NOTE — Telephone Encounter (Signed)
Charge nurse at [facility] is aware of results. She will make sure that he is made aware of this.

## 2013-03-05 NOTE — Assessment & Plan Note (Signed)
Will repeat CXR to ensure clearing of previous infiltrate.

## 2013-03-15 ENCOUNTER — Telehealth (HOSPITAL_COMMUNITY): Payer: Self-pay

## 2013-03-15 NOTE — Telephone Encounter (Signed)
I have called and left a message with Lavalle to inquire about participation in Pulmonary Rehab. Will send letter in mail and follow up.  

## 2013-04-19 ENCOUNTER — Telehealth (HOSPITAL_COMMUNITY): Payer: Self-pay

## 2013-04-19 NOTE — Telephone Encounter (Signed)
I have called and left a message with Nevin to inquire about participation in Pulmonary Rehab. Will send letter in mail and follow up.  

## 2013-05-05 ENCOUNTER — Emergency Department (HOSPITAL_COMMUNITY)
Admission: EM | Admit: 2013-05-05 | Discharge: 2013-05-06 | Disposition: A | Discharge status: Home / Self Care | Payer: Medicare Other | Attending: Emergency Medicine | Admitting: Emergency Medicine

## 2013-05-05 ENCOUNTER — Emergency Department (HOSPITAL_COMMUNITY): Payer: Medicare Other

## 2013-05-05 ENCOUNTER — Encounter (HOSPITAL_COMMUNITY): Payer: Self-pay | Admitting: Emergency Medicine

## 2013-05-05 DIAGNOSIS — J441 Chronic obstructive pulmonary disease with (acute) exacerbation: Secondary | ICD-10-CM | POA: Insufficient documentation

## 2013-05-05 DIAGNOSIS — F329 Major depressive disorder, single episode, unspecified: Secondary | ICD-10-CM | POA: Insufficient documentation

## 2013-05-05 DIAGNOSIS — J40 Bronchitis, not specified as acute or chronic: Secondary | ICD-10-CM

## 2013-05-05 DIAGNOSIS — Z9119 Patient's noncompliance with other medical treatment and regimen: Secondary | ICD-10-CM | POA: Insufficient documentation

## 2013-05-05 DIAGNOSIS — Z8701 Personal history of pneumonia (recurrent): Secondary | ICD-10-CM | POA: Insufficient documentation

## 2013-05-05 DIAGNOSIS — Z8546 Personal history of malignant neoplasm of prostate: Secondary | ICD-10-CM | POA: Insufficient documentation

## 2013-05-05 DIAGNOSIS — E785 Hyperlipidemia, unspecified: Secondary | ICD-10-CM | POA: Insufficient documentation

## 2013-05-05 DIAGNOSIS — I10 Essential (primary) hypertension: Secondary | ICD-10-CM | POA: Insufficient documentation

## 2013-05-05 DIAGNOSIS — G43909 Migraine, unspecified, not intractable, without status migrainosus: Secondary | ICD-10-CM | POA: Insufficient documentation

## 2013-05-05 DIAGNOSIS — F3289 Other specified depressive episodes: Secondary | ICD-10-CM | POA: Insufficient documentation

## 2013-05-05 DIAGNOSIS — Z7901 Long term (current) use of anticoagulants: Secondary | ICD-10-CM | POA: Insufficient documentation

## 2013-05-05 DIAGNOSIS — Z91199 Patient's noncompliance with other medical treatment and regimen due to unspecified reason: Secondary | ICD-10-CM | POA: Insufficient documentation

## 2013-05-05 DIAGNOSIS — IMO0002 Reserved for concepts with insufficient information to code with codable children: Secondary | ICD-10-CM | POA: Insufficient documentation

## 2013-05-05 DIAGNOSIS — I2510 Atherosclerotic heart disease of native coronary artery without angina pectoris: Secondary | ICD-10-CM | POA: Insufficient documentation

## 2013-05-05 DIAGNOSIS — Z79899 Other long term (current) drug therapy: Secondary | ICD-10-CM | POA: Insufficient documentation

## 2013-05-05 DIAGNOSIS — Z85038 Personal history of other malignant neoplasm of large intestine: Secondary | ICD-10-CM | POA: Insufficient documentation

## 2013-05-05 DIAGNOSIS — Z872 Personal history of diseases of the skin and subcutaneous tissue: Secondary | ICD-10-CM | POA: Insufficient documentation

## 2013-05-05 DIAGNOSIS — F17200 Nicotine dependence, unspecified, uncomplicated: Secondary | ICD-10-CM | POA: Insufficient documentation

## 2013-05-05 LAB — COMPREHENSIVE METABOLIC PANEL
ALBUMIN: 3.2 g/dL — AB (ref 3.5–5.2)
ALK PHOS: 81 U/L (ref 39–117)
ALT: 18 U/L (ref 0–53)
AST: 22 U/L (ref 0–37)
BILIRUBIN TOTAL: 0.3 mg/dL (ref 0.3–1.2)
BUN: 14 mg/dL (ref 6–23)
CHLORIDE: 106 meq/L (ref 96–112)
CO2: 24 mEq/L (ref 19–32)
Calcium: 9.5 mg/dL (ref 8.4–10.5)
Creatinine, Ser: 0.73 mg/dL (ref 0.50–1.35)
GFR calc Af Amer: >90 mL/min (ref >90)
GFR calc non Af Amer: >90 mL/min (ref >90)
Glucose, Bld: 109 mg/dL — ABNORMAL HIGH (ref 70–99)
POTASSIUM: 4.3 meq/L (ref 3.7–5.3)
SODIUM: 141 meq/L (ref 137–147)
TOTAL PROTEIN: 7.1 g/dL (ref 6.0–8.3)

## 2013-05-05 LAB — CBC
HCT: 40.3 % (ref 39.0–52.0)
Hemoglobin: 13.1 g/dL (ref 13.0–17.0)
MCH: 30.8 pg (ref 26.0–34.0)
MCHC: 32.5 g/dL (ref 30.0–36.0)
MCV: 94.6 fL (ref 78.0–100.0)
Platelets: 184 10*3/uL (ref 150–400)
RBC: 4.26 MIL/uL (ref 4.22–5.81)
RDW: 13.8 % (ref 11.5–15.5)
WBC: 8.4 10*3/uL (ref 4.0–10.5)

## 2013-05-05 LAB — TROPONIN I: Troponin I: <0.3 ng/mL (ref <0.30)

## 2013-05-05 LAB — PRO B NATRIURETIC PEPTIDE: Pro B Natriuretic peptide (BNP): 1000 pg/mL — ABNORMAL HIGH (ref 0–125)

## 2013-05-05 MED ORDER — PREDNISONE (PAK) 10 MG PO TABS: ORAL_TABLET | ORAL | Status: DC

## 2013-05-05 MED ORDER — ALBUTEROL SULFATE (2.5 MG/3ML) 0.083% IN NEBU
5.0000 mg | INHALATION_SOLUTION | Freq: Once | RESPIRATORY_TRACT | Status: AC
  Administered 2013-05-05: 5 mg via RESPIRATORY_TRACT
  Filled 2013-05-05: qty 6

## 2013-05-05 MED ORDER — IPRATROPIUM BROMIDE 0.02 % IN SOLN
0.5000 mg | Freq: Once | RESPIRATORY_TRACT | Status: AC
  Administered 2013-05-05: 0.5 mg via RESPIRATORY_TRACT
  Filled 2013-05-05: qty 2.5

## 2013-05-05 MED ORDER — METHYLPREDNISOLONE SODIUM SUCC 125 MG IJ SOLR
125.0000 mg | Freq: Once | INTRAMUSCULAR | Status: AC
  Administered 2013-05-05: 125 mg via INTRAVENOUS
  Filled 2013-05-05: qty 2

## 2013-05-05 NOTE — ED Notes (Signed)
PER EMS - pt from [facility] with c/o SOB/cough x2 weeks, ls ronchi per EMS.  A+Ox4.  Ambulatory.

## 2013-05-05 NOTE — ED Provider Notes (Signed)
CSN: [id number]     Arrival date & time 05/05/13  2134 History   First MD Initiated Contact with Patient 05/05/13 2221     Chief Complaint  Patient presents with  . Shortness of Breath  . Cough  Level 5 caveat: confusion HPI [facility] with shortness and cough for the last two weeks.  Pt has history of COPD.   Pt has been coughing more than usual.  He does have pain in his chest when he coughs.  No fevers.  No vomiting or diarrhea.  He has not seen anyone for this until this evening.  The cough has persisted so he decided to come in to be evaluated. Past Medical History  Diagnosis Date  . Community acquired pneumonia     Right middle lobe community-acquired pneumonia  . Toxic metabolic encephalopathy   . Chronic atrial flutter     a. rate controlled with bb therapy;  b. h/o bradycardia when on digoxin 12/2011.  . Tobacco abuse   . History of coronary artery disease 2008    a. Cath at [facility]: RCA 95%, RI 60%  - medical therapy  . Ischemic cardiomyopathy     a. prev documented EF of 20-30%;  b. 09/2012 Echo: EF 40-45%, diff HK, mild MR, mild bi-atrial enlargement.  . History of prostate cancer   . Chronic obstructive pulmonary disease     with mild bronchospasm, improved  . Hypertension   . History of alcohol abuse     History of prior heavy alcohol abuse  . History of depression   . Altered mental status 2012   . Noncompliance   . Depression   . Ulcer   . Hx of migraines   . Hyperlipidemia   . Colon cancer    Past Surgical History  Procedure Laterality Date  . Appendectomy     Family History  Problem Relation Age of Onset  . Cancer Father     Prostate Cancer  . Heart disease Other     Parent  . Alcohol abuse Other     Parent  . Arthritis Other     parent  . Hypertension Other     Parent   History  Substance Use Topics  . Smoking status: Current Every Day Smoker -- 0.50 packs/day for 40 years    Types: Cigarettes  . Smokeless tobacco:  Never Used  . Alcohol Use: No    Review of Systems  All other systems reviewed and are negative.     Allergies  Oat flour and Other  Home Medications   Prior to Admission medications   Medication Sig Start Date End Date Taking? Authorizing Provider  beclomethasone (QVAR) 40 MCG/ACT inhaler Inhale 2 puffs into the lungs 2 (two) times daily.   Yes Historical Provider, MD  DULoxetine (CYMBALTA) 60 MG capsule Take 60 mg by mouth daily.   Yes Historical Provider, MD  feeding supplement, ENSURE COMPLETE, (ENSURE COMPLETE) LIQD Take 237 mLs by mouth 2 (two) times daily between meals. 01/27/13  Yes Velvet Bathe, MD  folic acid (FOLVITE) 1 MG tablet Take 1 tablet (1 mg total) by mouth daily. 12/31/11  Yes Marianne L York, PA-C  guaiFENesin (MUCINEX) 600 MG 12 hr tablet Take 1 tablet (600 mg total) by mouth 2 (two) times daily. 12/31/11  Yes Marianne L York, PA-C  lactulose (CHRONULAC) 10 GM/15ML solution Take 20 g by mouth daily.   Yes Historical Provider, MD  metoprolol tartrate (LOPRESSOR) 12.5 mg  TABS tablet Take 0.5 tablets (12.5 mg total) by mouth 2 (two) times daily. 01/27/13  Yes Velvet Bathe, MD  mirtazapine (REMERON) 15 MG tablet Take 15 mg by mouth at bedtime.   Yes Historical Provider, MD  Multiple Vitamin (MULTIVITAMIN) tablet Take 1 tablet by mouth daily.   Yes Historical Provider, MD  paliperidone (INVEGA) 6 MG 24 hr tablet Take 6 mg by mouth every morning.   Yes Historical Provider, MD  QUEtiapine (SEROQUEL) 50 MG tablet Take 50 mg by mouth 2 (two) times daily.   Yes Historical Provider, MD  rosuvastatin (CRESTOR) 20 MG tablet Take 20 mg by mouth daily.   Yes Historical Provider, MD  thiamine (VITAMIN B-1) 100 MG tablet Take 100 mg by mouth daily.   Yes Historical Provider, MD  tiotropium (SPIRIVA) 18 MCG inhalation capsule Place 18 mcg into inhaler and inhale daily. 11/06/12  Yes Chesley Mires, MD  topiramate (TOPAMAX) 25 MG tablet Take 25 mg by mouth at bedtime.    Yes Historical  Provider, MD  warfarin (COUMADIN) 5 MG tablet Take 5-7.5 mg by mouth daily. Take 5mg  daily except on Sundays take 7.5mg    Yes Historical Provider, MD  albuterol (PROVENTIL HFA;VENTOLIN HFA) 108 (90 BASE) MCG/ACT inhaler Inhale 2 puffs into the lungs every 6 (six) hours as needed.    Historical Provider, MD  Sennosides-Docusate Sodium (SENNA PLUS PO) Take 1 tablet by mouth 2 (two) times daily as needed (constipation).     Historical Provider, MD   BP 113/56  Pulse 68  Temp(Src) 98.8 F (37.1 C) (Oral)  Resp 18  SpO2 95% Physical Exam  Nursing note and vitals reviewed. Constitutional: He appears well-developed and well-nourished. No distress.  HENT:  Head: Normocephalic and atraumatic.  Right Ear: External ear normal.  Left Ear: External ear normal.  Eyes: Conjunctivae are normal. Right eye exhibits no discharge. Left eye exhibits no discharge. No scleral icterus.  Neck: Neck supple. No tracheal deviation present.  Cardiovascular: Normal rate, regular rhythm and intact distal pulses.   Pulmonary/Chest: Effort normal and breath sounds normal. No stridor. No respiratory distress. He has no wheezes. He has no rales.  ronchi and wheezes, no distress  Abdominal: Soft. Bowel sounds are normal. He exhibits no distension. There is no tenderness. There is no rebound and no guarding.  Musculoskeletal: He exhibits no edema and no tenderness.  Neurological: He is alert. He has normal strength. No cranial nerve deficit (no facial droop, extraocular movements intact, no slurred speech) or sensory deficit. He exhibits normal muscle tone. He displays no seizure activity. Coordination normal.  Skin: Skin is warm and dry. No rash noted.  Psychiatric: He has a normal mood and affect.    ED Course  Procedures (including critical care time) Labs Review Labs Reviewed  COMPREHENSIVE METABOLIC PANEL - Abnormal; Notable for the following:    Glucose, Bld 109 (*)    Albumin 3.2 (*)    All other components  within normal limits  PRO B NATRIURETIC PEPTIDE - Abnormal; Notable for the following:    Pro B Natriuretic peptide (BNP) 1000.0 (*)    All other components within normal limits  CBC  TROPONIN I    Imaging Review Dg Chest 2 View  05/05/2013   CLINICAL DATA:  Shortness of breath  EXAM: CHEST  2 VIEW  COMPARISON:  DG CHEST 2 VIEW dated 03/05/2013; DG CHEST 2 VIEW dated 11/06/2012; DG CHEST 2 VIEW dated 10/02/2012; DG CHEST 2 VIEW dated 01/23/2013  FINDINGS: There  are mild bilateral chronic bronchitic changes. Hazy right lower lobe airspace opacity medially likely representing crowding of chronic interstitial disease which is similar in appearance to the prior exam of 01/23/2013. There is no other focal parenchymal opacity. There is no pleural effusion or pneumothorax. The heart and mediastinal contours are unremarkable.  The osseous structures are unremarkable.  IMPRESSION: Mild bilateral chronic bronchitic changes. Mild superimposed acute bronchitis cannot be excluded.   Electronically Signed   By: Kathreen Devoid   On: 05/05/2013 23:06     EKG Interpretation   Date/Time:  Wednesday May 05 2013 21:41:17 EDT Ventricular Rate:  69 PR Interval:    QRS Duration: 163 QT Interval:  494 QTC Calculation: 529 R Axis:   103 Text Interpretation:  Atrial flutter with predominant 4:1 AV block  Nonspecific intraventricular conduction delay Borderline abnrm T,  anterolateral leads DUPLICATE EKG Confirmed by Rula Keniston  MD-J, Ceirra Belli ([id number]) on  05/05/2013 10:41:32 PM     Medications  albuterol (PROVENTIL) (2.5 MG/3ML) 0.083% nebulizer solution 5 mg (not administered)  ipratropium (ATROVENT) nebulizer solution 0.5 mg (not administered)  methylPREDNISolone sodium succinate (SOLU-MEDROL) 125 mg/2 mL injection 125 mg (125 mg Intravenous Given 05/05/13 2320)    MDM   Final diagnoses:  Bronchitis    Pt without tachypnea or hypoxia.  Some wheezing noted on exam.  Albuterol and steroids given.  Will dc home with rx  for prednisone taper.  Continue inhalers.  Follow up with pcp    Kathalene Frames, MD 05/06/13 0000

## 2013-05-05 NOTE — Discharge Instructions (Signed)

## 2013-05-05 NOTE — ED Notes (Signed)
Pt to radiology.

## 2013-05-05 NOTE — ED Notes (Signed)
Bed: WN46 Expected date:  Expected time:  Means of arrival:  Comments: EMS/72 yo male from SNF with cough, SOB

## 2013-05-05 NOTE — ED Notes (Signed)
Initial Contact - pt resting on stretcher, reports c/o SOB and cough x2 weeks.  Pt speaking full/clear sentences, rr even/un-lab, sats mid-upper 90s on RA.  No cough noted at this time.  LS rhonci throughout.  No resp distress.  Pt denies CP/palpitations, Aflutter noted on monitor, EKG obtained.  Pt denies dizziness, weakness.  Skin PWD.  A+O.  Changed to hospital gown, NAD.  Awaiting EDP eval.

## 2013-05-06 MED ORDER — IPRATROPIUM BROMIDE 0.02 % IN SOLN
0.5000 mg | Freq: Once | RESPIRATORY_TRACT | Status: AC
  Administered 2013-05-06: 0.5 mg via RESPIRATORY_TRACT

## 2013-05-06 MED ORDER — ALBUTEROL SULFATE (2.5 MG/3ML) 0.083% IN NEBU
5.0000 mg | INHALATION_SOLUTION | Freq: Once | RESPIRATORY_TRACT | Status: AC
  Administered 2013-05-06: 5 mg via RESPIRATORY_TRACT

## 2013-05-06 MED ORDER — ALBUTEROL SULFATE (2.5 MG/3ML) 0.083% IN NEBU
INHALATION_SOLUTION | RESPIRATORY_TRACT | Status: AC
  Filled 2013-05-06: qty 6

## 2013-05-06 MED ORDER — IPRATROPIUM BROMIDE 0.02 % IN SOLN
RESPIRATORY_TRACT | Status: AC
  Filled 2013-05-06: qty 2.5

## 2013-06-07 ENCOUNTER — Ambulatory Visit (INDEPENDENT_AMBULATORY_CARE_PROVIDER_SITE_OTHER): Payer: Medicare Other | Admitting: Surgery

## 2013-06-07 DIAGNOSIS — I4892 Unspecified atrial flutter: Secondary | ICD-10-CM

## 2013-06-07 LAB — POCT INR: INR: 3.5

## 2013-06-15 ENCOUNTER — Ambulatory Visit (INDEPENDENT_AMBULATORY_CARE_PROVIDER_SITE_OTHER): Payer: Medicare Other | Admitting: *Deleted

## 2013-06-15 DIAGNOSIS — I4892 Unspecified atrial flutter: Secondary | ICD-10-CM

## 2013-06-15 LAB — POCT INR: INR: 3

## 2013-07-09 ENCOUNTER — Telehealth (HOSPITAL_COMMUNITY): Payer: Self-pay

## 2013-07-09 NOTE — Telephone Encounter (Signed)
I have called and left a message with [facility] to inquire about participation in Pulmonary Rehab. Will send letter in mail and follow up.

## 2013-07-17 ENCOUNTER — Inpatient Hospital Stay (HOSPITAL_COMMUNITY)
Admission: EM | Admit: 2013-07-17 | Discharge: 2013-07-22 | DRG: 291 | Disposition: A | Discharge status: Skilled Nursing Facility | Payer: PRIVATE HEALTH INSURANCE | Attending: Family Medicine | Admitting: Family Medicine

## 2013-07-17 ENCOUNTER — Emergency Department (HOSPITAL_COMMUNITY): Payer: PRIVATE HEALTH INSURANCE

## 2013-07-17 ENCOUNTER — Encounter (HOSPITAL_COMMUNITY): Payer: Self-pay | Admitting: Emergency Medicine

## 2013-07-17 ENCOUNTER — Other Ambulatory Visit: Payer: Self-pay

## 2013-07-17 DIAGNOSIS — Z85038 Personal history of other malignant neoplasm of large intestine: Secondary | ICD-10-CM | POA: Diagnosis not present

## 2013-07-17 DIAGNOSIS — Z7982 Long term (current) use of aspirin: Secondary | ICD-10-CM | POA: Diagnosis not present

## 2013-07-17 DIAGNOSIS — J441 Chronic obstructive pulmonary disease with (acute) exacerbation: Secondary | ICD-10-CM

## 2013-07-17 DIAGNOSIS — F329 Major depressive disorder, single episode, unspecified: Secondary | ICD-10-CM | POA: Diagnosis present

## 2013-07-17 DIAGNOSIS — I509 Heart failure, unspecified: Secondary | ICD-10-CM | POA: Diagnosis present

## 2013-07-17 DIAGNOSIS — Z66 Do not resuscitate: Secondary | ICD-10-CM | POA: Diagnosis present

## 2013-07-17 DIAGNOSIS — Z720 Tobacco use: Secondary | ICD-10-CM | POA: Diagnosis present

## 2013-07-17 DIAGNOSIS — IMO0002 Reserved for concepts with insufficient information to code with codable children: Secondary | ICD-10-CM | POA: Diagnosis not present

## 2013-07-17 DIAGNOSIS — R7309 Other abnormal glucose: Secondary | ICD-10-CM | POA: Diagnosis present

## 2013-07-17 DIAGNOSIS — I5023 Acute on chronic systolic (congestive) heart failure: Principal | ICD-10-CM

## 2013-07-17 DIAGNOSIS — E039 Hypothyroidism, unspecified: Secondary | ICD-10-CM | POA: Diagnosis present

## 2013-07-17 DIAGNOSIS — I10 Essential (primary) hypertension: Secondary | ICD-10-CM

## 2013-07-17 DIAGNOSIS — F0390 Unspecified dementia without behavioral disturbance: Secondary | ICD-10-CM | POA: Diagnosis present

## 2013-07-17 DIAGNOSIS — I483 Typical atrial flutter: Secondary | ICD-10-CM

## 2013-07-17 DIAGNOSIS — E785 Hyperlipidemia, unspecified: Secondary | ICD-10-CM | POA: Diagnosis present

## 2013-07-17 DIAGNOSIS — F17200 Nicotine dependence, unspecified, uncomplicated: Secondary | ICD-10-CM | POA: Diagnosis present

## 2013-07-17 DIAGNOSIS — I255 Ischemic cardiomyopathy: Secondary | ICD-10-CM | POA: Diagnosis present

## 2013-07-17 DIAGNOSIS — F3289 Other specified depressive episodes: Secondary | ICD-10-CM | POA: Diagnosis present

## 2013-07-17 DIAGNOSIS — Z8546 Personal history of malignant neoplasm of prostate: Secondary | ICD-10-CM | POA: Diagnosis not present

## 2013-07-17 DIAGNOSIS — F1010 Alcohol abuse, uncomplicated: Secondary | ICD-10-CM | POA: Diagnosis present

## 2013-07-17 DIAGNOSIS — J438 Other emphysema: Secondary | ICD-10-CM

## 2013-07-17 DIAGNOSIS — J9620 Acute and chronic respiratory failure, unspecified whether with hypoxia or hypercapnia: Secondary | ICD-10-CM | POA: Diagnosis present

## 2013-07-17 DIAGNOSIS — I159 Secondary hypertension, unspecified: Secondary | ICD-10-CM

## 2013-07-17 DIAGNOSIS — Z7901 Long term (current) use of anticoagulants: Secondary | ICD-10-CM | POA: Diagnosis not present

## 2013-07-17 DIAGNOSIS — I2589 Other forms of chronic ischemic heart disease: Secondary | ICD-10-CM | POA: Diagnosis present

## 2013-07-17 DIAGNOSIS — I2510 Atherosclerotic heart disease of native coronary artery without angina pectoris: Secondary | ICD-10-CM

## 2013-07-17 DIAGNOSIS — I4892 Unspecified atrial flutter: Secondary | ICD-10-CM | POA: Diagnosis present

## 2013-07-17 DIAGNOSIS — E43 Unspecified severe protein-calorie malnutrition: Secondary | ICD-10-CM | POA: Diagnosis present

## 2013-07-17 DIAGNOSIS — R0602 Shortness of breath: Secondary | ICD-10-CM | POA: Diagnosis present

## 2013-07-17 DIAGNOSIS — I5022 Chronic systolic (congestive) heart failure: Secondary | ICD-10-CM

## 2013-07-17 DIAGNOSIS — J439 Emphysema, unspecified: Secondary | ICD-10-CM

## 2013-07-17 LAB — PROTIME-INR
INR: 2.13 — ABNORMAL HIGH (ref 0.00–1.49)
INR: 2.04 — ABNORMAL HIGH (ref 0.00–1.49)
PROTHROMBIN TIME: 23 s — AB (ref 11.6–15.2)
Prothrombin Time: 23.8 seconds — ABNORMAL HIGH (ref 11.6–15.2)

## 2013-07-17 LAB — AMMONIA: Ammonia: 50 umol/L (ref 11–60)

## 2013-07-17 LAB — COMPREHENSIVE METABOLIC PANEL
ALK PHOS: 77 U/L (ref 39–117)
ALT: 21 U/L (ref 0–53)
AST: 31 U/L (ref 0–37)
Albumin: 3.3 g/dL — ABNORMAL LOW (ref 3.5–5.2)
BUN: 18 mg/dL (ref 6–23)
CHLORIDE: 94 meq/L — AB (ref 96–112)
CO2: 25 meq/L (ref 19–32)
Calcium: 9.2 mg/dL (ref 8.4–10.5)
Creatinine, Ser: 0.78 mg/dL (ref 0.50–1.35)
GFR calc non Af Amer: 87 mL/min — ABNORMAL LOW (ref >90)
GLUCOSE: 170 mg/dL — AB (ref 70–99)
POTASSIUM: 5.1 meq/L (ref 3.7–5.3)
Sodium: 151 mEq/L — ABNORMAL HIGH (ref 137–147)
Total Bilirubin: 0.3 mg/dL (ref 0.3–1.2)
Total Protein: 7.4 g/dL (ref 6.0–8.3)

## 2013-07-17 LAB — I-STAT VENOUS BLOOD GAS, ED
Acid-base deficit: 1 mmol/L (ref 0.0–2.0)
Bicarbonate: 26.9 mEq/L — ABNORMAL HIGH (ref 20.0–24.0)
O2 SAT: 99 %
PO2 VEN: 138 mmHg — AB (ref 30.0–45.0)
TCO2: 29 mmol/L (ref 0–100)
pCO2, Ven: 55.1 mmHg — ABNORMAL HIGH (ref 45.0–50.0)
pH, Ven: 7.296 (ref 7.250–7.300)

## 2013-07-17 LAB — I-STAT CHEM 8, ED
BUN: 18 mg/dL (ref 6–23)
Calcium, Ion: 1.27 mmol/L (ref 1.13–1.30)
Chloride: 103 mEq/L (ref 96–112)
Creatinine, Ser: 0.8 mg/dL (ref 0.50–1.35)
Glucose, Bld: 168 mg/dL — ABNORMAL HIGH (ref 70–99)
HCT: 44 % (ref 39.0–52.0)
HEMOGLOBIN: 15 g/dL (ref 13.0–17.0)
Potassium: 4.6 mEq/L (ref 3.7–5.3)
SODIUM: 144 meq/L (ref 137–147)
TCO2: 25 mmol/L (ref 0–100)

## 2013-07-17 LAB — CBC WITH DIFFERENTIAL/PLATELET
Basophils Absolute: 0 10*3/uL (ref 0.0–0.1)
Basophils Relative: 0 % (ref 0–1)
Eosinophils Absolute: 0 10*3/uL (ref 0.0–0.7)
Eosinophils Relative: 1 % (ref 0–5)
HEMATOCRIT: 39.9 % (ref 39.0–52.0)
Hemoglobin: 12.6 g/dL — ABNORMAL LOW (ref 13.0–17.0)
LYMPHS ABS: 1.1 10*3/uL (ref 0.7–4.0)
LYMPHS PCT: 13 % (ref 12–46)
MCH: 29.8 pg (ref 26.0–34.0)
MCHC: 31.6 g/dL (ref 30.0–36.0)
MCV: 94.3 fL (ref 78.0–100.0)
MONO ABS: 1.1 10*3/uL — AB (ref 0.1–1.0)
MONOS PCT: 12 % (ref 3–12)
Neutro Abs: 6.6 10*3/uL (ref 1.7–7.7)
Neutrophils Relative %: 74 % (ref 43–77)
Platelets: 182 10*3/uL (ref 150–400)
RBC: 4.23 MIL/uL (ref 4.22–5.81)
RDW: 15.6 % — ABNORMAL HIGH (ref 11.5–15.5)
WBC: 8.9 10*3/uL (ref 4.0–10.5)

## 2013-07-17 LAB — I-STAT TROPONIN, ED: Troponin i, poc: 0.03 ng/mL (ref 0.00–0.08)

## 2013-07-17 LAB — POC OCCULT BLOOD, ED: FECAL OCCULT BLD: POSITIVE — AB

## 2013-07-17 LAB — PRO B NATRIURETIC PEPTIDE: Pro B Natriuretic peptide (BNP): 5636 pg/mL — ABNORMAL HIGH (ref 0–125)

## 2013-07-17 LAB — TSH: TSH: 0.237 u[IU]/mL — ABNORMAL LOW (ref 0.350–4.500)

## 2013-07-17 LAB — I-STAT CG4 LACTIC ACID, ED: Lactic Acid, Venous: 1.64 mmol/L (ref 0.5–2.2)

## 2013-07-17 MED ORDER — LEVOFLOXACIN IN D5W 750 MG/150ML IV SOLN
750.0000 mg | INTRAVENOUS | Status: DC
  Administered 2013-07-17 – 2013-07-20 (×4): 750 mg via INTRAVENOUS
  Filled 2013-07-17 (×5): qty 150

## 2013-07-17 MED ORDER — DULOXETINE HCL 60 MG PO CPEP
60.0000 mg | ORAL_CAPSULE | Freq: Every day | ORAL | Status: DC
  Administered 2013-07-20: 60 mg via ORAL
  Filled 2013-07-17 (×3): qty 1

## 2013-07-17 MED ORDER — ALBUTEROL (5 MG/ML) CONTINUOUS INHALATION SOLN: 10.0000 mg/h | INHALATION_SOLUTION | RESPIRATORY_TRACT | Status: DC

## 2013-07-17 MED ORDER — ALBUTEROL (5 MG/ML) CONTINUOUS INHALATION SOLN
10.0000 mg/h | INHALATION_SOLUTION | RESPIRATORY_TRACT | Status: DC
  Administered 2013-07-17 (×2): 10 mg/h via RESPIRATORY_TRACT
  Filled 2013-07-17: qty 20

## 2013-07-17 MED ORDER — THIAMINE HCL 100 MG/ML IJ SOLN
100.0000 mg | Freq: Every day | INTRAMUSCULAR | Status: DC
  Administered 2013-07-18 – 2013-07-19 (×2): 100 mg via INTRAVENOUS
  Filled 2013-07-17 (×5): qty 1

## 2013-07-17 MED ORDER — ACETAMINOPHEN 325 MG PO TABS: 650.0000 mg | ORAL_TABLET | Freq: Four times a day (QID) | ORAL | Status: DC | PRN

## 2013-07-17 MED ORDER — SODIUM CHLORIDE 0.9 % IV SOLN: 250.0000 mL | INTRAVENOUS | Status: DC | PRN

## 2013-07-17 MED ORDER — ATORVASTATIN CALCIUM 40 MG PO TABS
40.0000 mg | ORAL_TABLET | Freq: Every day | ORAL | Status: DC
  Administered 2013-07-20 – 2013-07-21 (×2): 40 mg via ORAL
  Filled 2013-07-17 (×5): qty 1

## 2013-07-17 MED ORDER — SODIUM CHLORIDE 0.9 % IJ SOLN
3.0000 mL | Freq: Two times a day (BID) | INTRAMUSCULAR | Status: DC
  Administered 2013-07-17 – 2013-07-22 (×7): 3 mL via INTRAVENOUS

## 2013-07-17 MED ORDER — MIRTAZAPINE 15 MG PO TABS
15.0000 mg | ORAL_TABLET | Freq: Every day | ORAL | Status: DC
  Administered 2013-07-20 – 2013-07-21 (×2): 15 mg via ORAL
  Filled 2013-07-17 (×6): qty 1

## 2013-07-17 MED ORDER — TOPIRAMATE 25 MG PO TABS
25.0000 mg | ORAL_TABLET | Freq: Every day | ORAL | Status: DC
  Administered 2013-07-20 – 2013-07-21 (×2): 25 mg via ORAL
  Filled 2013-07-17 (×6): qty 1

## 2013-07-17 MED ORDER — ALBUTEROL SULFATE (2.5 MG/3ML) 0.083% IN NEBU: 5.0000 mg | INHALATION_SOLUTION | RESPIRATORY_TRACT | Status: DC | PRN

## 2013-07-17 MED ORDER — FOLIC ACID 1 MG PO TABS
1.0000 mg | ORAL_TABLET | Freq: Every day | ORAL | Status: DC
  Administered 2013-07-20 – 2013-07-22 (×3): 1 mg via ORAL
  Filled 2013-07-17 (×5): qty 1

## 2013-07-17 MED ORDER — ADULT MULTIVITAMIN W/MINERALS CH
1.0000 | ORAL_TABLET | Freq: Every day | ORAL | Status: DC
  Administered 2013-07-20 – 2013-07-22 (×3): 1 via ORAL
  Filled 2013-07-17 (×5): qty 1

## 2013-07-17 MED ORDER — WARFARIN SODIUM 5 MG PO TABS
5.0000 mg | ORAL_TABLET | Freq: Every day | ORAL | Status: AC
  Filled 2013-07-17: qty 1

## 2013-07-17 MED ORDER — IPRATROPIUM-ALBUTEROL 0.5-2.5 (3) MG/3ML IN SOLN
3.0000 mL | RESPIRATORY_TRACT | Status: DC
  Administered 2013-07-18 (×5): 3 mL via RESPIRATORY_TRACT
  Filled 2013-07-17 (×5): qty 3

## 2013-07-17 MED ORDER — METOPROLOL TARTRATE 1 MG/ML IV SOLN
5.0000 mg | Freq: Four times a day (QID) | INTRAVENOUS | Status: DC
  Administered 2013-07-17: 5 mg via INTRAVENOUS
  Filled 2013-07-17 (×6): qty 5

## 2013-07-17 MED ORDER — FLUTICASONE PROPIONATE HFA 44 MCG/ACT IN AERO
2.0000 | INHALATION_SPRAY | Freq: Two times a day (BID) | RESPIRATORY_TRACT | Status: DC
  Administered 2013-07-18 – 2013-07-22 (×8): 2 via RESPIRATORY_TRACT
  Filled 2013-07-17: qty 10.6

## 2013-07-17 MED ORDER — PREDNISONE 50 MG PO TABS
60.0000 mg | ORAL_TABLET | Freq: Every day | ORAL | Status: DC
  Filled 2013-07-17 (×2): qty 1

## 2013-07-17 MED ORDER — SODIUM CHLORIDE 0.9 % IJ SOLN
3.0000 mL | INTRAMUSCULAR | Status: DC | PRN
  Administered 2013-07-19: 3 mL via INTRAVENOUS

## 2013-07-17 MED ORDER — QUETIAPINE FUMARATE 50 MG PO TABS
50.0000 mg | ORAL_TABLET | Freq: Two times a day (BID) | ORAL | Status: DC
  Filled 2013-07-17 (×5): qty 1

## 2013-07-17 MED ORDER — SODIUM CHLORIDE 0.9 % IJ SOLN
3.0000 mL | Freq: Two times a day (BID) | INTRAMUSCULAR | Status: DC
  Administered 2013-07-18 – 2013-07-19 (×3): 3 mL via INTRAVENOUS

## 2013-07-17 MED ORDER — VITAMIN B-1 100 MG PO TABS
100.0000 mg | ORAL_TABLET | Freq: Every day | ORAL | Status: DC
  Administered 2013-07-20 – 2013-07-22 (×3): 100 mg via ORAL
  Filled 2013-07-17 (×5): qty 1

## 2013-07-17 MED ORDER — ACETAMINOPHEN 650 MG RE SUPP: 650.0000 mg | Freq: Four times a day (QID) | RECTAL | Status: DC | PRN

## 2013-07-17 MED ORDER — FUROSEMIDE 10 MG/ML IJ SOLN
40.0000 mg | Freq: Once | INTRAMUSCULAR | Status: AC
  Administered 2013-07-17: 40 mg via INTRAVENOUS
  Filled 2013-07-17: qty 4

## 2013-07-17 MED ORDER — PALIPERIDONE ER 6 MG PO TB24
6.0000 mg | ORAL_TABLET | Freq: Every day | ORAL | Status: DC
  Filled 2013-07-17 (×3): qty 1

## 2013-07-17 MED ORDER — IPRATROPIUM BROMIDE 0.02 % IN SOLN
0.5000 mg | Freq: Once | RESPIRATORY_TRACT | Status: AC
  Administered 2013-07-17: 0.5 mg via RESPIRATORY_TRACT
  Filled 2013-07-17: qty 2.5

## 2013-07-17 MED ORDER — WARFARIN - PHARMACIST DOSING INPATIENT: Freq: Every day | Status: DC

## 2013-07-17 MED ORDER — LORAZEPAM 1 MG PO TABS: 1.0000 mg | ORAL_TABLET | Freq: Four times a day (QID) | ORAL | Status: DC | PRN

## 2013-07-17 MED ORDER — NITROGLYCERIN 2 % TD OINT
1.0000 [in_us] | TOPICAL_OINTMENT | Freq: Once | TRANSDERMAL | Status: AC
  Administered 2013-07-17: 1 [in_us] via TOPICAL
  Filled 2013-07-17: qty 1

## 2013-07-17 MED ORDER — NITROGLYCERIN 2 % TD OINT
1.0000 [in_us] | TOPICAL_OINTMENT | Freq: Once | TRANSDERMAL | Status: DC
  Filled 2013-07-17: qty 1

## 2013-07-17 MED ORDER — LORAZEPAM 2 MG/ML IJ SOLN
1.0000 mg | Freq: Four times a day (QID) | INTRAMUSCULAR | Status: DC | PRN
  Administered 2013-07-18: 1 mg via INTRAVENOUS
  Filled 2013-07-17: qty 1

## 2013-07-17 NOTE — H&P (Signed)
[facility] Admission History and Physical Service Pager: [phone]  Patient name: David [name] Medical record number: [id number] Date of birth: 1940-06-17 Age: 73 y.o. Gender: male  Primary Care Provider: PROVIDER NOT IN SYSTEM Consultants: None Code Status: Unknown given Dementia (will proceed will full code until we clarify with Nursing facility)  Chief Complaint: SOB  Assessment and Plan: David [name] is a 73 y.o. male presenting with acute dyspnea. PMH is significant for chronic systolic CHF (EF 53-74%; Echo 08/2012), Atrial flutter on coumadin, HTN, COPD w/ emphysema, CAD.   Acute Dyspnea -  Appears to be predominantly from acute COPD exacerbation given diffuse wheezing on exam.  Patient may also have a mild component of acute CHF exacerbation given elevated BNP of 5636.  No fever. Normal WBC count. Normal lactic acid.  Chest ray unremarkable.  Received 125 mg of Solu-Medrol and NTG x 2 in route.  Received 2, 1 hour CAT 10 mg treatments.  Currently improved and off BiPAP. - Admit to SDU, attending Dr. Gwendlyn Deutscher - Supplemental O2 - Duonebs Q4/Albuterol Q2PRN - Prednisone 60 mg daily (starting tomorrow) - Given severe COPD, will also treat with Levaquin - Continuing home QVAR (flovent substituted)  - Does not appear clinically overloaded on exam, but BNP is elevated from priors.  Will give IV Lasix 40 mg x 1 and reassess in the AM.  Atrial flutter  - Will give IV Metoprolol for rate control - Will likely transition to PO in the am. - Continue Warfarin (per pharm)  HTN - Currently normotensive. - Will continue to monitor closely  Hx of Hypothyroidism - Not on synthroid currently - Checking TSH  Psych - Unclear diagnosis.  Patient appears to have baseline dementia. - Will continue home medications: Cymbalta, Remeron, Invega, Seroquel, Topamax.   CAD, HLD - Continue statin (lipitor substituted for crestor)  + Hemoccult - Hemoglobin stable and at  baseline - Will continue to trend daily - Low threshold for GI consultation given prior history of Colon Cancer  Hyperglycemia - Elevated on BMP - Obtaining A1C  FEN/GI: NPO pending swallow screen; SL IV. Prophylaxis: Coumadin Per Pharm  Disposition: Pending clinical improvement.  History of Present Illness:  David [name] is a 73 y.o. male with a PMH of chronic systolic CHF (EF 82-70%; Echo 08/2012), Atrial flutter on coumadin, HTN, COPD w/ emphysema, CAD who presents with SOB.   [name] presents from an outside nursing facility (unclear whether ALF or SNF) with SOB.  Level V caveat - due to dementia; patient unable to answer questions appropriately.  History obtained primarily through nursing staff and EMR.  Per EMR, SOB worsened today.  On EMS arrival to facility,  patient was dyspneic with oxygen saturation 90% on room air.  In route to ED, patient was given 125 mg of Solu-Medrol, 2 SL nitroglycerin tablets and was placed on CPAP.  In the ED, patient noted to have increased WOB and was placed on BIPAP.  He was also given CAT 10 mg (1 hour) x 2.   Chest xray was negative. BNP elevated at ~ 5000.  Lactic acid and WBC WNL.  Review Of Systems: Level 5 caveat. No fevers reported from [facility].  Patient Active Problem List   Diagnosis Date Noted  . Protein-calorie malnutrition, severe 01/27/2013  . Acute on chronic systolic heart failure 78/67/5449  . Syphilis 12/31/2011  . Healthcare-associated pneumonia 12/24/2011  . Chronic systolic congestive heart failure, NYHA class 3 12/24/2011  . COPD with emphysema  12/24/2011  . Thrombocytopenia 12/24/2011  . Warfarin anticoagulation 12/24/2011  . Hypothyroidism 08/01/2011  . HTN (hypertension) 07/02/2011  . CAD (coronary artery disease) 04/11/2011  . Ischemic cardiomyopathy 04/11/2011  . Tobacco abuse 04/11/2011  . Atrial flutter 04/26/2010   Past Medical History: Past Medical History  Diagnosis Date  . Community acquired  pneumonia     Right middle lobe community-acquired pneumonia  . Toxic metabolic encephalopathy   . Chronic atrial flutter     a. rate controlled with bb therapy;  b. h/o bradycardia when on digoxin 12/2011.  . Tobacco abuse   . History of coronary artery disease 2008    a. Cath at [facility]: RCA 95%, RI 60%  - medical therapy  . Ischemic cardiomyopathy     a. prev documented EF of 20-30%;  b. 09/2012 Echo: EF 40-45%, diff HK, mild MR, mild bi-atrial enlargement.  . History of prostate cancer   . Chronic obstructive pulmonary disease     with mild bronchospasm, improved  . Hypertension   . History of alcohol abuse     History of prior heavy alcohol abuse  . History of depression   . Altered mental status 2012   . Noncompliance   . Depression   . Ulcer   . Hx of migraines   . Hyperlipidemia   . Colon cancer    Past Surgical History: Past Surgical History  Procedure Laterality Date  . Appendectomy     Social History: History  Substance Use Topics  . Smoking status: Current Every Day Smoker -- 0.50 packs/day for 40 years    Types: Cigarettes  . Smokeless tobacco: Never Used  . Alcohol Use: No   Family History: Family History  Problem Relation Age of Onset  . Cancer Father     Prostate Cancer  . Heart disease Other     Parent  . Alcohol abuse Other     Parent  . Arthritis Other     parent  . Hypertension Other     Parent   Allergies and Medications: Allergies  Allergen Reactions  . Oat Flour [Oat]     Unknown per MAR  . Other     [facility]: unknown reaction per MAR   No current facility-administered medications on file prior to encounter.   Current Outpatient Prescriptions on File Prior to Encounter  Medication Sig Dispense Refill  . beclomethasone (QVAR) 40 MCG/ACT inhaler Inhale 2 puffs into the lungs 2 (two) times daily.      . DULoxetine (CYMBALTA) 60 MG capsule Take 60 mg by mouth daily.      . folic acid (FOLVITE) 1 MG tablet Take 1 tablet (1 mg  total) by mouth daily.  30 tablet  1  . guaiFENesin (MUCINEX) 600 MG 12 hr tablet Take 1 tablet (600 mg total) by mouth 2 (two) times daily.      Marland Kitchen lactulose (CHRONULAC) 10 GM/15ML solution Take 20 g by mouth daily.      . mirtazapine (REMERON) 15 MG tablet Take 15 mg by mouth at bedtime.      . paliperidone (INVEGA) 6 MG 24 hr tablet Take 6 mg by mouth daily.       . QUEtiapine (SEROQUEL) 50 MG tablet Take 50 mg by mouth 2 (two) times daily.      . rosuvastatin (CRESTOR) 20 MG tablet Take 20 mg by mouth daily.      Marland Kitchen thiamine (VITAMIN B-1) 100 MG tablet Take 100 mg by mouth daily.      Marland Kitchen  tiotropium (SPIRIVA) 18 MCG inhalation capsule Place 18 mcg into inhaler and inhale daily.      Marland Kitchen topiramate (TOPAMAX) 25 MG tablet Take 25 mg by mouth at bedtime.       Marland Kitchen warfarin (COUMADIN) 5 MG tablet Take 5 mg by mouth every evening. 5pm        Objective: BP 133/91  Pulse 116  Temp(Src) 98.4 F (36.9 C) (Oral)  Resp 22  SpO2 100% Exam: General: chronically ill appearing male, resting in bed, mild tachypnea noted; otherwise NAD.  HEENT: NCAT. MMM. Poor dentition. PERRLA. Cardiovascular: Tachycardic, Irregular. No murmur noted. Respiratory: Diffuse course wheezing throughout all lung fields.  Abdomen: soft, nontender, nondistended. Extremities: Trace LE edema bilaterally. Skin: warm, dry, intact. Neuro: Alert. Not oriented.  Could not recall year/month/president.  Follows commands.  No focal neurological deficits.   Labs: CBC BMET   Recent Labs Lab 07/17/13 1549 07/17/13 1558  WBC 8.9  --   HGB 12.6* 15.0  HCT 39.9 44.0  PLT 182  --     Recent Labs Lab 07/17/13 1549 07/17/13 1558  NA 151* 144  K 5.1 4.6  CL 94* 103  CO2 25  --   BUN 18 18  CREATININE 0.78 0.80  GLUCOSE 170* 168*  CALCIUM 9.2  --      BNP (last 3 results)  Recent Labs  01/23/13 1653 05/05/13 2215 07/17/13 1549  PROBNP 1890.0* 1000.0* 5636.0*   Troponin - POC Negative  Lactic Acid - 1.64  (normal)  Lab Results  Component Value Date   INR 2.13* 07/17/2013   INR 3.0 06/15/2013   INR 3.5 06/07/2013   FOBT - Positive.  EKG - Atrial flutter (rate 114).  VBG: 7.296/55.1/138/26.9  Imaging: Dg Chest Portable 1 View 07/17/2013   IMPRESSION: 1. Stable cardiomegaly and chronic interstitial changes without acute or superimposed abnormality.  Coral Spikes, DO 07/17/2013, 6:34 PM PGY-2, [facility] Intern pager: [phone], text pages welcome

## 2013-07-17 NOTE — ED Notes (Signed)
Pt removed from bipap and placed on 3 liters n/c

## 2013-07-17 NOTE — ED Provider Notes (Signed)
CSN: [id number]     Arrival date & time 07/17/13  1522 History   First MD Initiated Contact with Patient 07/17/13 1532     Chief Complaint  Patient presents with  . Shortness of Breath     (Consider location/radiation/quality/duration/timing/severity/associated sxs/prior Treatment) Patient is a 73 y.o. male presenting with shortness of breath. The history is provided by the patient and the EMS personnel. The history is limited by the condition of the patient and the absence of a caregiver.  Shortness of Breath Severity:  Severe Onset quality:  Gradual Timing:  Constant Progression:  Worsening Relieved by:  Oxygen Associated symptoms: chest pain and cough   Associated symptoms: no abdominal pain, no fever, no headaches and no vomiting     73 yo male pw sob and cp. Onset ~18 months. Worse today. Chest pain today. Aching. Diffuse chest. Has had previously. Uncertain what from. Nothing in particular makes it better or worse. Some orthopnea per EMS, although patient denies.  EMS arrival with O2 sats 90% on RA. Appeared to be in respiratory distress. Concern for volume overload. Placed on cpap and given NTG x2. Also given solumedrol 125mg . Some improvement en route. EKG reportedly with atrial flutter. Rates low 100's.  Patient endorses cough, but uncertain how long it's been going on.    Past Medical History  Diagnosis Date  . Community acquired pneumonia     Right middle lobe community-acquired pneumonia  . Toxic metabolic encephalopathy   . Chronic atrial flutter     a. rate controlled with bb therapy;  b. h/o bradycardia when on digoxin 12/2011.  . Tobacco abuse   . History of coronary artery disease 2008    a. Cath at [facility]: RCA 95%, RI 60%  - medical therapy  . Ischemic cardiomyopathy     a. prev documented EF of 20-30%;  b. 09/2012 Echo: EF 40-45%, diff HK, mild MR, mild bi-atrial enlargement.  . History of prostate cancer   . Chronic obstructive pulmonary disease    with mild bronchospasm, improved  . Hypertension   . History of alcohol abuse     History of prior heavy alcohol abuse  . History of depression   . Altered mental status 2012   . Noncompliance   . Depression   . Ulcer   . Hx of migraines   . Hyperlipidemia   . Colon cancer    Past Surgical History  Procedure Laterality Date  . Appendectomy     Family History  Problem Relation Age of Onset  . Cancer Father     Prostate Cancer  . Heart disease Other     Parent  . Alcohol abuse Other     Parent  . Arthritis Other     parent  . Hypertension Other     Parent   History  Substance Use Topics  . Smoking status: Current Every Day Smoker -- 0.50 packs/day for 40 years    Types: Cigarettes  . Smokeless tobacco: Never Used  . Alcohol Use: No    Review of Systems  Constitutional: Negative for fever and chills.  Respiratory: Positive for cough and shortness of breath.   Cardiovascular: Positive for chest pain and leg swelling (BLE. chronic).  Gastrointestinal: Negative for nausea, vomiting, abdominal pain and diarrhea.  Genitourinary: Negative for flank pain and difficulty urinating.  Musculoskeletal: Negative for back pain.  Neurological: Negative for headaches.  All other systems reviewed and are negative.     Allergies  Oat flour  and Other  Home Medications   Prior to Admission medications   Medication Sig Start Date End Date Taking? Authorizing Provider  albuterol (PROVENTIL HFA;VENTOLIN HFA) 108 (90 BASE) MCG/ACT inhaler Inhale 2 puffs into the lungs every 6 (six) hours as needed.    Historical Provider, MD  beclomethasone (QVAR) 40 MCG/ACT inhaler Inhale 2 puffs into the lungs 2 (two) times daily.    Historical Provider, MD  DULoxetine (CYMBALTA) 60 MG capsule Take 60 mg by mouth daily.    Historical Provider, MD  feeding supplement, ENSURE COMPLETE, (ENSURE COMPLETE) LIQD Take 237 mLs by mouth 2 (two) times daily between meals. 01/27/13   Velvet Bathe, MD  folic  acid (FOLVITE) 1 MG tablet Take 1 tablet (1 mg total) by mouth daily. 12/31/11   Bobby Rumpf York, PA-C  guaiFENesin (MUCINEX) 600 MG 12 hr tablet Take 1 tablet (600 mg total) by mouth 2 (two) times daily. 12/31/11   Bobby Rumpf [place], PA-C  lactulose (CHRONULAC) 10 GM/15ML solution Take 20 g by mouth daily.    Historical Provider, MD  metoprolol tartrate (LOPRESSOR) 12.5 mg TABS tablet Take 0.5 tablets (12.5 mg total) by mouth 2 (two) times daily. 01/27/13   Velvet Bathe, MD  mirtazapine (REMERON) 15 MG tablet Take 15 mg by mouth at bedtime.    Historical Provider, MD  Multiple Vitamin (MULTIVITAMIN) tablet Take 1 tablet by mouth daily.    Historical Provider, MD  paliperidone (INVEGA) 6 MG 24 hr tablet Take 6 mg by mouth every morning.    Historical Provider, MD  predniSONE (STERAPRED UNI-PAK) 10 MG tablet Take 6 tabs by mouth daily  for 2 days, then 5 tabs for 2 days, then 4 tabs for 2 days, then 3 tabs for 2 days, 2 tabs for 2 days, then 1 tab by mouth daily for 2 days 05/05/13   Dorie Rank, MD  QUEtiapine (SEROQUEL) 50 MG tablet Take 50 mg by mouth 2 (two) times daily.    Historical Provider, MD  rosuvastatin (CRESTOR) 20 MG tablet Take 20 mg by mouth daily.    Historical Provider, MD  Sennosides-Docusate Sodium (SENNA PLUS PO) Take 1 tablet by mouth 2 (two) times daily as needed (constipation).     Historical Provider, MD  thiamine (VITAMIN B-1) 100 MG tablet Take 100 mg by mouth daily.    Historical Provider, MD  tiotropium (SPIRIVA) 18 MCG inhalation capsule Place 18 mcg into inhaler and inhale daily. 11/06/12   Chesley Mires, MD  topiramate (TOPAMAX) 25 MG tablet Take 25 mg by mouth at bedtime.     Historical Provider, MD  warfarin (COUMADIN) 5 MG tablet Take 5-7.5 mg by mouth daily. Take 5mg  daily except on Sundays take 7.5mg     Historical Provider, MD   BP 105/65  Pulse 110  Temp(Src) 98.4 F (36.9 C) (Oral)  Resp 28  SpO2 93% Physical Exam  Nursing note and vitals reviewed. Constitutional:  He appears well-developed and well-nourished. No distress.  Sitting up in bed. RR low 20's. Speaks in 3-5 word sentences.  HENT:  Head: Normocephalic and atraumatic.  Eyes: Conjunctivae are normal. Right eye exhibits no discharge. Left eye exhibits no discharge.  Neck: No tracheal deviation present.  Cardiovascular: Normal heart sounds and intact distal pulses.   Irregularly irregular.  Pulmonary/Chest: No stridor. He has wheezes (diffuse coarse BS). He has no rales.  tachypnea  Abdominal: Soft. He exhibits no distension. There is no tenderness. There is no guarding.  Musculoskeletal: He exhibits no edema  and no tenderness.  Neurological: He is alert.  Skin: Skin is warm and dry.    ED Course  Procedures (including critical care time) Labs Review Labs Reviewed  PRO B NATRIURETIC PEPTIDE  CBC WITH DIFFERENTIAL  PROTIME-INR  COMPREHENSIVE METABOLIC PANEL  AMMONIA  BLOOD GAS, VENOUS  I-STAT [place], ED  I-STAT CHEM 8, ED  POC OCCULT BLOOD, ED    Imaging Review Dg Chest Portable 1 View  07/17/2013   CLINICAL DATA:  SHORTNESS OF BREATH  EXAM: PORTABLE CHEST - 1 VIEW  COMPARISON:  05/05/2013  FINDINGS: Mild cardiomegaly. Coarse bronchovascular markings as before. No focal airspace consolidation. Stable left infrahilar subsegmental atelectasis or scarring. Atheromatous aorta. No effusion. Visualized skeletal structures are unremarkable.  IMPRESSION: 1. Stable cardiomegaly and chronic interstitial changes without acute or superimposed abnormality.   Electronically Signed   By: Arne Cleveland M.D.   On: 07/17/2013 15:56     EKG Interpretation   Date/Time:  Saturday July 17 2013 15:28:49 EDT Ventricular Rate:  117 PR Interval:    QRS Duration: 166 QT Interval:  397 QTC Calculation: 554 R Axis:   64 Text Interpretation:  Age not entered, assumed to be  73 years old for  purpose of ECG interpretation Atrial flutter Right bundle branch block LVH  with secondary repolarization  abnormality Baseline wander in lead(s) III  No significant change since last tracing Confirmed by Winfred Leeds  MD, SAM  [phone]) on 07/17/2013 3:51:25 PM      MDM   Final diagnoses:  None    SOB. Diffuse coarse bs. Portable cxr obtained and largely clear. Symptoms likely more COPD related. Already received solumedrol. placed on cont alb neb and given atrovent. EKG with a flutter. Similar to prior.    4:19 PM Contacted [facility]. SOB. Chronic smoker. Facility feels like this is chronic sob worsening today. No fevers.  Primary care physician - "Ebram Sadu". ACT.    Patient with some hypercarbia. Placed on BIPAP and another continuous albuterol treatment.  Admit to medicine for further mgmt of acute respiratory failure. Likely combination of CHF exacerbation and COPD exacerbation Maintaining airway on Bipap. Easily arousable. Reports improvement in symptoms.   hemocult positive. hgb ~12. Doubt acute GI bleed based on history. Monitor as inpt.   Labs and imaging reviewed by myself and considered in medical decision making if ordered. Imaging interpreted by radiology.   Discussed case with Dr. Winfred Leeds who is in agreement with assessment and plan.   Bonnita Hollow, MD 07/18/13 [phone]

## 2013-07-17 NOTE — ED Notes (Signed)
NOTIFIED DR. Winfred Leeds FOR PATIENTS LAB RESULTS OF CG4+ LACTIC ACID ,POC OCCULT BLOOD STOOL CARD ,@16 :10 PM ,07/17/2013.

## 2013-07-17 NOTE — ED Provider Notes (Signed)
Low 5 caveat patient  dyspneic. Vital signs unstable. Seen immediately upon arrival 1525The patient complains of shortness of breath for approximately 2 months. EMS treated patient with Solu-Medrol 125 mg intravenously. Also placed on CPAP and received sublingual nitroglycerin 2 sublingual in the field. On exam patient is ill-appearing. Speaks in 2 or 3 word sentences. Appears dyspneic. HEENT exam no facial asymmetry neck supple positive JVD lungs coarse rhonchi diffusely heart tachycardic irregular abdomen nondistended nontender extremities without edema Chest xray viewed by me . Albuterol neb ordered   1620 p.m. patient appears mildly somnolent. He is arousable to verbal stimulus. Lungs with diffuse coarse rhonchi while getting albuterol neb treatment. BiPAP ordered  1700 hours patient more alert on BiPAP. Elevated BNP possibly consistent with CHF. Patient requiring BiPAP. Dyspnea is multifactorial with 6 components of CHF and COPD. Technical is in paced ordered. He will require additional nebulized treatments. Chest x-ray viewed by me  Results for orders placed during the hospital encounter of 07/17/13  PRO B NATRIURETIC PEPTIDE      Result Value Ref Range   Pro B Natriuretic peptide (BNP) 5636.0 (*) 0 - 125 pg/mL  CBC WITH DIFFERENTIAL      Result Value Ref Range   WBC 8.9  4.0 - 10.5 K/uL   RBC 4.23  4.22 - 5.81 MIL/uL   Hemoglobin 12.6 (*) 13.0 - 17.0 g/dL   HCT 39.9  39.0 - 52.0 %   MCV 94.3  78.0 - 100.0 fL   MCH 29.8  26.0 - 34.0 pg   MCHC 31.6  30.0 - 36.0 g/dL   RDW 15.6 (*) 11.5 - 15.5 %   Platelets 182  150 - 400 K/uL   Neutrophils Relative % 74  43 - 77 %   Neutro Abs 6.6  1.7 - 7.7 K/uL   Lymphocytes Relative 13  12 - 46 %   Lymphs Abs 1.1  0.7 - 4.0 K/uL   Monocytes Relative 12  3 - 12 %   Monocytes Absolute 1.1 (*) 0.1 - 1.0 K/uL   Eosinophils Relative 1  0 - 5 %   Eosinophils Absolute 0.0  0.0 - 0.7 K/uL   Basophils Relative 0  0 - 1 %   Basophils Absolute 0.0   0.0 - 0.1 K/uL  PROTIME-INR      Result Value Ref Range   Prothrombin Time 23.8 (*) 11.6 - 15.2 seconds   INR 2.13 (*) 0.00 - 1.49  COMPREHENSIVE METABOLIC PANEL      Result Value Ref Range   Sodium 151 (*) 137 - 147 mEq/L   Potassium 5.1  3.7 - 5.3 mEq/L   Chloride 94 (*) 96 - 112 mEq/L   CO2 25  19 - 32 mEq/L   Glucose, Bld 170 (*) 70 - 99 mg/dL   BUN 18  6 - 23 mg/dL   Creatinine, Ser 0.78  0.50 - 1.35 mg/dL   Calcium 9.2  8.4 - 10.5 mg/dL   Total Protein 7.4  6.0 - 8.3 g/dL   Albumin 3.3 (*) 3.5 - 5.2 g/dL   AST 31  0 - 37 U/L   ALT 21  0 - 53 U/L   Alkaline Phosphatase 77  39 - 117 U/L   Total Bilirubin 0.3  0.3 - 1.2 mg/dL   GFR calc non Af Amer 87 (*) >90 mL/min   GFR calc Af Amer >90  >90 mL/min  AMMONIA      Result Value Ref Range  Ammonia 50  11 - 60 umol/L  I-STAT TROPOININ, ED      Result Value Ref Range   Troponin i, poc 0.03  0.00 - 0.08 ng/mL   Comment 3           I-STAT CHEM 8, ED      Result Value Ref Range   Sodium 144  137 - 147 mEq/L   Potassium 4.6  3.7 - 5.3 mEq/L   Chloride 103  96 - 112 mEq/L   BUN 18  6 - 23 mg/dL   Creatinine, Ser 0.80  0.50 - 1.35 mg/dL   Glucose, Bld 168 (*) 70 - 99 mg/dL   Calcium, Ion 1.27  1.13 - 1.30 mmol/L   TCO2 25  0 - 100 mmol/L   Hemoglobin 15.0  13.0 - 17.0 g/dL   HCT 44.0  39.0 - 52.0 %  POC OCCULT BLOOD, ED      Result Value Ref Range   Fecal Occult Bld POSITIVE (*) NEGATIVE  I-STAT CG4 LACTIC ACID, ED      Result Value Ref Range   Lactic Acid, Venous 1.64  0.5 - 2.2 mmol/L  I-STAT VENOUS BLOOD GAS, ED      Result Value Ref Range   pH, Ven 7.296  7.250 - 7.300   pCO2, Ven 55.1 (*) 45.0 - 50.0 mmHg   pO2, Ven 138.0 (*) 30.0 - 45.0 mmHg   Bicarbonate 26.9 (*) 20.0 - 24.0 mEq/L   TCO2 29  0 - 100 mmol/L   O2 Saturation 99.0     Acid-base deficit 1.0  0.0 - 2.0 mmol/L   Sample type VENOUS     Dg Chest Portable 1 View  07/17/2013   CLINICAL DATA:  SHORTNESS OF BREATH  EXAM: PORTABLE CHEST - 1 VIEW   COMPARISON:  05/05/2013  FINDINGS: Mild cardiomegaly. Coarse bronchovascular markings as before. No focal airspace consolidation. Stable left infrahilar subsegmental atelectasis or scarring. Atheromatous aorta. No effusion. Visualized skeletal structures are unremarkable.  IMPRESSION: 1. Stable cardiomegaly and chronic interstitial changes without acute or superimposed abnormality.   Electronically Signed   By: Arne Cleveland M.D.   On: 07/17/2013 15:56    Diagnosis #1acute respiratory failure. #2hyperglycemia CRITICAL CARE Performed by: Orlie Dakin Total critical care time: 30 minute Critical care time was exclusive of separately billable procedures and treating other patients. Critical care was necessary to treat or prevent imminent or life-threatening deterioration. Critical care was time spent personally by me on the following activities: development of treatment plan with patient and/or surrogate as well as nursing, discussions with consultants, evaluation of patient's response to treatment, examination of patient, obtaining history from patient or surrogate, ordering and performing treatments and interventions, ordering and review of laboratory studies, ordering and review of radiographic studies, pulse oximetry and re-evaluation of patient's condition.   Orlie Dakin, MD 07/17/13 [phone]

## 2013-07-17 NOTE — ED Notes (Signed)
Pt found standing at sink by tech , neb pulled off face , IV's pulled out  ,and  Pt attempted to urinate in sink

## 2013-07-17 NOTE — Progress Notes (Signed)
ANTICOAGULATION CONSULT NOTE - Initial Consult  Pharmacy Consult for Warfarin Indication: atrial fibrillation  Allergies  Allergen Reactions  . Oat Flour [Oat]     Unknown per MAR  . Other     Greens: unknown reaction per [facility]   Patient Measurements: Height: 6' (182.9 cm) Weight: 147 lb 11.3 oz (67 kg) IBW/kg (Calculated) : 77.6  Vital Signs: Temp: 98 F (36.7 C) (06/27 2154) Temp src: Oral (06/27 2154) BP: 117/56 mmHg (06/27 2154) Pulse Rate: 98 (06/27 2154)  Labs:  Recent Labs  07/17/13 1549 07/17/13 1558  HGB 12.6* 15.0  HCT 39.9 44.0  PLT 182  --   LABPROT 23.8*  --   INR 2.13*  --   CREATININE 0.78 0.80    Estimated Creatinine Clearance: 77.9 ml/min (by C-G formula based on Cr of 0.8).   Medical History: Past Medical History  Diagnosis Date  . Community acquired pneumonia     Right middle lobe community-acquired pneumonia  . Toxic metabolic encephalopathy   . Chronic atrial flutter     a. rate controlled with bb therapy;  b. h/o bradycardia when on digoxin 12/2011.  . Tobacco abuse   . History of coronary artery disease 2008    a. Cath at [facility]: RCA 95%, RI 60%  - medical therapy  . Ischemic cardiomyopathy     a. prev documented EF of 20-30%;  b. 09/2012 Echo: EF 40-45%, diff HK, mild MR, mild bi-atrial enlargement.  . History of prostate cancer   . Chronic obstructive pulmonary disease     with mild bronchospasm, improved  . Hypertension   . History of alcohol abuse     History of prior heavy alcohol abuse  . History of depression   . Altered mental status 2012   . Noncompliance   . Depression   . Ulcer   . Hx of migraines   . Hyperlipidemia   . Colon cancer    Assessment: 73 yo male presents from [facility] with c/o SOB.  She is on chronic warfarin therapy for Afib.  His INR tonight is therapeutic at 23.8sec/2.13.  H/H is stable as well as his platelets.  No noted bleeding complications.  His last dose was yesterday 6/26.  We have  been asked to manage his therapy while he is hospitalized.  Goal of Therapy:  INR 2-3 Monitor platelets by anticoagulation protocol: Yes   Plan:  1.  Will continue his home regimen of Warfarin 5mg  PO daily. 2.  F/U am PT/INR 3.  Monitor for bleeding complications.  Rober Minion, PharmD., MS Clinical Pharmacist Pager:  [phone] Thank you for allowing pharmacy to be part of this patients care team. 07/17/2013,10:21 PM

## 2013-07-17 NOTE — ED Notes (Signed)
Attempted report 

## 2013-07-17 NOTE — ED Notes (Signed)
Pt here from [facility] with c/o sob , pt placed on cpap in field by ems  Given 125mg  solumedrol and 2 nitro , pt improved on cpap

## 2013-07-17 NOTE — ED Notes (Signed)
Pt walked back to bed , placed back on neb, cleaned up and new IV placed

## 2013-07-18 ENCOUNTER — Encounter (HOSPITAL_COMMUNITY): Payer: Self-pay | Admitting: *Deleted

## 2013-07-18 DIAGNOSIS — R0602 Shortness of breath: Secondary | ICD-10-CM | POA: Diagnosis not present

## 2013-07-18 DIAGNOSIS — I4892 Unspecified atrial flutter: Secondary | ICD-10-CM

## 2013-07-18 DIAGNOSIS — I10 Essential (primary) hypertension: Secondary | ICD-10-CM

## 2013-07-18 DIAGNOSIS — I5023 Acute on chronic systolic (congestive) heart failure: Secondary | ICD-10-CM | POA: Diagnosis not present

## 2013-07-18 DIAGNOSIS — J441 Chronic obstructive pulmonary disease with (acute) exacerbation: Secondary | ICD-10-CM

## 2013-07-18 LAB — BLOOD GAS, ARTERIAL
Acid-Base Excess: 3 mmol/L — ABNORMAL HIGH (ref 0.0–2.0)
Bicarbonate: 28.1 mEq/L — ABNORMAL HIGH (ref 20.0–24.0)
DRAWN BY: 10552
O2 CONTENT: 1.5 L/min
O2 SAT: 92.2 %
PCO2 ART: 51.9 mmHg — AB (ref 35.0–45.0)
Patient temperature: 98.6
TCO2: 29.7 mmol/L (ref 0–100)
pH, Arterial: 7.353 (ref 7.350–7.450)
pO2, Arterial: 70.5 mmHg — ABNORMAL LOW (ref 80.0–100.0)

## 2013-07-18 LAB — BASIC METABOLIC PANEL
BUN: 19 mg/dL (ref 6–23)
BUN: 20 mg/dL (ref 6–23)
CHLORIDE: 100 meq/L (ref 96–112)
CHLORIDE: 99 meq/L (ref 96–112)
CO2: 27 mEq/L (ref 19–32)
CO2: 25 meq/L (ref 19–32)
CREATININE: 0.71 mg/dL (ref 0.50–1.35)
Calcium: 9.3 mg/dL (ref 8.4–10.5)
Calcium: 9.3 mg/dL (ref 8.4–10.5)
Creatinine, Ser: 0.75 mg/dL (ref 0.50–1.35)
GFR calc non Af Amer: >90 mL/min (ref >90)
GFR calc non Af Amer: 89 mL/min — ABNORMAL LOW (ref >90)
Glucose, Bld: 117 mg/dL — ABNORMAL HIGH (ref 70–99)
Glucose, Bld: 168 mg/dL — ABNORMAL HIGH (ref 70–99)
POTASSIUM: 4.6 meq/L (ref 3.7–5.3)
Potassium: 5.6 mEq/L — ABNORMAL HIGH (ref 3.7–5.3)
SODIUM: 143 meq/L (ref 137–147)
Sodium: 140 mEq/L (ref 137–147)

## 2013-07-18 LAB — T4, FREE: Free T4: 0.91 ng/dL (ref 0.80–1.80)

## 2013-07-18 LAB — HEMOGLOBIN A1C
Hgb A1c MFr Bld: 7 % — ABNORMAL HIGH (ref <5.7)
Mean Plasma Glucose: 154 mg/dL — ABNORMAL HIGH (ref <117)

## 2013-07-18 LAB — PROTIME-INR
INR: 1.88 — ABNORMAL HIGH (ref 0.00–1.49)
Prothrombin Time: 21.6 seconds — ABNORMAL HIGH (ref 11.6–15.2)

## 2013-07-18 LAB — CBC
HEMATOCRIT: 40.4 % (ref 39.0–52.0)
Hemoglobin: 13.2 g/dL (ref 13.0–17.0)
MCH: 30.3 pg (ref 26.0–34.0)
MCHC: 32.7 g/dL (ref 30.0–36.0)
MCV: 92.9 fL (ref 78.0–100.0)
Platelets: 186 10*3/uL (ref 150–400)
RBC: 4.35 MIL/uL (ref 4.22–5.81)
RDW: 15.4 % (ref 11.5–15.5)
WBC: 6 10*3/uL (ref 4.0–10.5)

## 2013-07-18 LAB — MRSA PCR SCREENING: MRSA by PCR: NEGATIVE

## 2013-07-18 LAB — TROPONIN I: Troponin I: <0.3 ng/mL (ref <0.30)

## 2013-07-18 LAB — T3, FREE: T3, Free: 2.7 pg/mL (ref 2.3–4.2)

## 2013-07-18 MED ORDER — LORAZEPAM 2 MG/ML IJ SOLN
INTRAMUSCULAR | Status: AC
  Filled 2013-07-18: qty 1

## 2013-07-18 MED ORDER — ENOXAPARIN SODIUM 80 MG/0.8ML ~~LOC~~ SOLN
65.0000 mg | Freq: Two times a day (BID) | SUBCUTANEOUS | Status: DC
  Administered 2013-07-18 – 2013-07-21 (×6): 65 mg via SUBCUTANEOUS
  Filled 2013-07-18 (×9): qty 0.8

## 2013-07-18 MED ORDER — LORAZEPAM 2 MG/ML IJ SOLN
1.0000 mg | INTRAMUSCULAR | Status: DC | PRN
  Administered 2013-07-18 – 2013-07-20 (×2): 1 mg via INTRAVENOUS
  Filled 2013-07-18: qty 1

## 2013-07-18 MED ORDER — METOPROLOL TARTRATE 1 MG/ML IV SOLN
10.0000 mg | Freq: Four times a day (QID) | INTRAVENOUS | Status: DC
  Administered 2013-07-18 – 2013-07-20 (×10): 10 mg via INTRAVENOUS
  Filled 2013-07-18 (×16): qty 10

## 2013-07-18 MED ORDER — LORAZEPAM 1 MG PO TABS: 1.0000 mg | ORAL_TABLET | ORAL | Status: DC | PRN

## 2013-07-18 MED ORDER — METOPROLOL TARTRATE 1 MG/ML IV SOLN
5.0000 mg | Freq: Four times a day (QID) | INTRAVENOUS | Status: DC
  Administered 2013-07-18: 5 mg via INTRAVENOUS

## 2013-07-18 MED ORDER — PANTOPRAZOLE SODIUM 40 MG IV SOLR
40.0000 mg | INTRAVENOUS | Status: DC
  Administered 2013-07-19 – 2013-07-21 (×3): 40 mg via INTRAVENOUS
  Filled 2013-07-18 (×4): qty 40

## 2013-07-18 MED ORDER — IPRATROPIUM-ALBUTEROL 0.5-2.5 (3) MG/3ML IN SOLN
3.0000 mL | Freq: Three times a day (TID) | RESPIRATORY_TRACT | Status: DC
  Administered 2013-07-19: 3 mL via RESPIRATORY_TRACT
  Filled 2013-07-18: qty 3

## 2013-07-18 MED ORDER — METHYLPREDNISOLONE SODIUM SUCC 125 MG IJ SOLR
60.0000 mg | INTRAMUSCULAR | Status: DC
  Administered 2013-07-18 – 2013-07-20 (×3): 60 mg via INTRAVENOUS
  Filled 2013-07-18 (×4): qty 0.96

## 2013-07-18 MED ORDER — CLINDAMYCIN PHOSPHATE 300 MG/50ML IV SOLN
300.0000 mg | Freq: Four times a day (QID) | INTRAVENOUS | Status: DC
  Administered 2013-07-18 – 2013-07-20 (×7): 300 mg via INTRAVENOUS
  Filled 2013-07-18 (×9): qty 50

## 2013-07-18 MED ORDER — HALOPERIDOL LACTATE 5 MG/ML IJ SOLN: 2.0000 mg | Freq: Four times a day (QID) | INTRAMUSCULAR | Status: DC | PRN

## 2013-07-18 NOTE — Evaluation (Signed)
Clinical/Bedside Swallow Evaluation Patient Details  Name: David [name] MRN: [id number] Date of Birth: 02-Feb-1940  Today's Date: 07/18/2013 Time: 1300-1320 SLP Time Calculation (min): 20 min  Past Medical History:  Past Medical History  Diagnosis Date  . Community acquired pneumonia     Right middle lobe community-acquired pneumonia  . Toxic metabolic encephalopathy   . Chronic atrial flutter     a. rate controlled with bb therapy;  b. h/o bradycardia when on digoxin 12/2011.  . Tobacco abuse   . History of coronary artery disease 2008    a. Cath at [facility]: RCA 95%, RI 60%  - medical therapy  . Ischemic cardiomyopathy     a. prev documented EF of 20-30%;  b. 09/2012 Echo: EF 40-45%, diff HK, mild MR, mild bi-atrial enlargement.  . History of prostate cancer   . Chronic obstructive pulmonary disease     with mild bronchospasm, improved  . Hypertension   . History of alcohol abuse     History of prior heavy alcohol abuse  . History of depression   . Altered mental status 2012   . Noncompliance   . Depression   . Ulcer   . Hx of migraines   . Hyperlipidemia   . Colon cancer    Past Surgical History:  Past Surgical History  Procedure Laterality Date  . Appendectomy     HPI:  Timon [name] is a 73 y.o. male presenting with acute dyspnea. PMH is significant for chronic systolic CHF (EF 76-73%; Echo 08/2012), Atrial flutter on coumadin, HTN, COPD w/ emphysema, CAD.   BSE ordered to assess risk for aspiration and recommend safest, PO diet due to [place] for dysphagia and current respiratory status.   MBS completed 12/24/11 with diet rec's of dysphagia 3 and thin liquids with use of swallow strategies of chin tuck and intermittent throat clear.    Assessment / Plan / Recommendation Clinical Impression  BSE completed but limited to trials of thin liquids and puree consistency.  +s/s of suspected aspiration immediately after swallow of thin by cup/straw with change in vital  signs.  Wet vocal quality s/p swallow of puree trials with patient reporting globus sensation and "choking".  Cued throat clear not effective in clearing vocal quality.   RR increased from 20 to 45 s/p swallow of puree.  Recommend continued NPO status as patient presenting decreased ability to protect airway fully with PO's.  ST to f/u 07/19/13 to reassess swallow to determine Po readiness.      Aspiration Risk  Moderate    Diet Recommendation NPO   Medication Administration: Via alternative means    Other  Recommendations Oral Care Recommendations: Oral care Q4 per protocol   Follow Up Recommendations  Skilled Nursing facility    Frequency and Duration min 2x/week  2 weeks   Pertinent Vitals/Pain RR lower 20's to 45 briefly    SLP Swallow Goals Please refer to Care Plans    Swallow Study Prior Functional Status   PMH significant for dysphagia     General Date of Onset: 07/17/13 HPI: David [name] is a 73 y.o. male presenting with acute dyspnea. PMH is significant for chronic systolic CHF (EF 41-93%; Echo 08/2012), Atrial flutter on coumadin, HTN, COPD w/ emphysema, CAD.  Type of Study: Bedside swallow evaluation Previous Swallow Assessment: 12/24/11 BSE/MBS dys 3/thin  BSE 01/24/2013  NPO  Diet Prior to this Study: NPO Respiratory Status: Room air History of Recent Intubation: No Behavior/Cognition: Alert;Cooperative;Distractible;Requires cueing Self-Feeding Abilities: Able  to feed self Patient Positioning: Upright in bed Baseline Vocal Quality: Hoarse;Low vocal intensity;Wet Volitional Cough: Wet;Congested Volitional Swallow: Able to elicit    Oral/Motor/Sensory Function Overall Oral Motor/Sensory Function: Appears within functional limits for tasks assessed   Ice Chips Ice chips: Not tested   Thin Liquid Thin Liquid: Impaired Presentation: Cup;Spoon;Straw Pharyngeal  Phase Impairments: Suspected delayed Swallow;Decreased hyoid-laryngeal movement;Cough - Immediate;Change in  Vital Signs;Wet Vocal Quality    Nectar Thick Nectar Thick Liquid: Not tested   Honey Thick Honey Thick Liquid: Not tested   Puree Puree: Impaired Pharyngeal Phase Impairments: Decreased hyoid-laryngeal movement;Suspected delayed Swallow;Wet Vocal Quality;Multiple swallows;Change in Vital Signs;Cough - Immediate Other Comments: Reported globus sensation.    Solid   GO    Solid: Not tested      David [name] [place], CCC-SLP [phone] [facility] 07/18/2013,3:05 PM

## 2013-07-18 NOTE — Consult Note (Signed)
PULMONARY / CRITICAL CARE MEDICINE   Name: David [name] MRN: [id number] DOB: May 27, 1940    ADMISSION DATE:  07/17/2013 CONSULTATION DATE:  07/18/13  REFERRING MD :  Vance Gather, MD PRIMARY SERVICE: Family Medicine  CHIEF COMPLAINT:  COPD exacerbation  BRIEF PATIENT DESCRIPTION:  73 years old male with PMH relevant for CHF with LVEF 40 to 45%, atrial flutter on coumadin, HTN, COPD, CAD and heavy alcohol use with last drink a few days ago. Presented with progressive SOB and wheezing. His chest X ray at admission is clear. PCCM consult called to assist with management of hypoxemia and hypercarbia.   SIGNIFICANT EVENTS / STUDIES:  Chest X ray: 1. Stable cardiomegaly and chronic interstitial changes without  acute or superimposed abnormality.   LINES / TUBES: - Peripheral IV's  CULTURES:   ANTIBIOTICS: Levaquin / Clindamycin  HISTORY OF PRESENT ILLNESS:   73 years old male with PMH relevant for CHF with LVEF 40 to 45%, atrial flutter on coumadin, HTN, COPD, CAD and heavy alcohol use with last drink a few days ago. Presented with progressive SOB and wheezing. His chest X ray at admission is clear. PCCM consult called to assist with management of hypoxemia and hypercarbia. At the time of my examination the patient is off the BiPAP, on 2 L [place], saturating 98%, his ABG at about 6 pm showed pH: 7.35 / PCO2 51 / PO2 70. The patient is slightly tachypneic and breathing 20 to 30 / min but denies any SOB. His HR is 120 to 140 on known A. Flutter. Does not have a WBC and has not had any fever. Patient looks anxious and confused.   PAST MEDICAL HISTORY :  Past Medical History  Diagnosis Date  . Community acquired pneumonia     Right middle lobe community-acquired pneumonia  . Toxic metabolic encephalopathy   . Chronic atrial flutter     a. rate controlled with bb therapy;  b. h/o bradycardia when on digoxin 12/2011.  . Tobacco abuse   . History of coronary artery disease 2008    a. Cath at  [facility]: RCA 95%, RI 60%  - medical therapy  . Ischemic cardiomyopathy     a. prev documented EF of 20-30%;  b. 09/2012 Echo: EF 40-45%, diff HK, mild MR, mild bi-atrial enlargement.  . History of prostate cancer   . Chronic obstructive pulmonary disease     with mild bronchospasm, improved  . Hypertension   . History of alcohol abuse     History of prior heavy alcohol abuse  . History of depression   . Altered mental status 2012   . Noncompliance   . Depression   . Ulcer   . Hx of migraines   . Hyperlipidemia   . Colon cancer    Past Surgical History  Procedure Laterality Date  . Appendectomy     Prior to Admission medications   Medication Sig Start Date End Date Taking? Authorizing Provider  albuterol (PROAIR HFA) 108 (90 BASE) MCG/ACT inhaler Inhale 2 puffs into the lungs every 6 (six) hours as needed for wheezing or shortness of breath.   Yes Historical Provider, MD  beclomethasone (QVAR) 40 MCG/ACT inhaler Inhale 2 puffs into the lungs 2 (two) times daily.   Yes Historical Provider, MD  DULoxetine (CYMBALTA) 60 MG capsule Take 60 mg by mouth daily.   Yes Historical Provider, MD  folic acid (FOLVITE) 1 MG tablet Take 1 tablet (1 mg total) by mouth daily. 12/31/11  Yes  Bobby Rumpf York, PA-C  guaiFENesin (MUCINEX) 600 MG 12 hr tablet Take 1 tablet (600 mg total) by mouth 2 (two) times daily. 12/31/11  Yes Marianne L York, PA-C  lactulose (CHRONULAC) 10 GM/15ML solution Take 20 g by mouth daily.   Yes Historical Provider, MD  metoprolol tartrate (LOPRESSOR) 25 MG tablet Take 12.5 mg by mouth 2 (two) times daily.   Yes Historical Provider, MD  mirtazapine (REMERON) 15 MG tablet Take 15 mg by mouth at bedtime.   Yes Historical Provider, MD  Multiple Vitamin (MULTIVITAMIN WITH MINERALS) TABS tablet Take 1 tablet by mouth daily.   Yes Historical Provider, MD  paliperidone (INVEGA) 6 MG 24 hr tablet Take 6 mg by mouth daily.    Yes Historical Provider, MD  QUEtiapine (SEROQUEL)  50 MG tablet Take 50 mg by mouth 2 (two) times daily.   Yes Historical Provider, MD  rosuvastatin (CRESTOR) 20 MG tablet Take 20 mg by mouth daily.   Yes Historical Provider, MD  thiamine (VITAMIN B-1) 100 MG tablet Take 100 mg by mouth daily.   Yes Historical Provider, MD  tiotropium (SPIRIVA) 18 MCG inhalation capsule Place 18 mcg into inhaler and inhale daily. 11/06/12  Yes Chesley Mires, MD  topiramate (TOPAMAX) 25 MG tablet Take 25 mg by mouth at bedtime.    Yes Historical Provider, MD  warfarin (COUMADIN) 5 MG tablet Take 5 mg by mouth every evening. 5pm   Yes Historical Provider, MD   Allergies  Allergen Reactions  . Oat Flour [Oat]     Unknown per MAR  . Other     Greens: unknown reaction per MAR    FAMILY HISTORY:  Family History  Problem Relation Age of Onset  . Cancer Father     Prostate Cancer  . Heart disease Other     Parent  . Alcohol abuse Other     Parent  . Arthritis Other     parent  . Hypertension Other     Parent   SOCIAL HISTORY:  reports that he has been smoking Cigarettes.  He has a 20 pack-year smoking history. He has never used smokeless tobacco. He reports that he does not drink alcohol or use illicit drugs.  REVIEW OF SYSTEMS:  All systems reviewed and found negative except for what I mentioned in the HPI.   SUBJECTIVE:   VITAL SIGNS: Temp:  [97.7 F (36.5 C)-98.4 F (36.9 C)] 98.1 F (36.7 C) (06/28 1954) Pulse Rate:  [47-144] 144 (06/28 1954) Resp:  [19-37] 23 (06/28 1954) BP: (91-134)/(51-88) 112/84 mmHg (06/28 1954) SpO2:  [85 %-100 %] 98 % (06/28 2012) Weight:  [145 lb 1 oz (65.8 kg)-147 lb 11.3 oz (67 kg)] 145 lb 1 oz (65.8 kg) (06/28 0500) HEMODYNAMICS:   VENTILATOR SETTINGS:   INTAKE / OUTPUT: Intake/Output     06/28 0701 - 06/29 0700   IV Piggyback    Total Intake(mL/kg)    Urine (mL/kg/hr)    Total Output     Net         Urine Occurrence 3 x     PHYSICAL EXAMINATION: General: Male patient in no acute distress. Eyes:  Anicteric sclerae. ENT: Oropharynx clear. Dry mucous membranes. No thrush Lymph: No cervical, supraclavicular, or axillary lymphadenopathy. Heart: Regular rhythm (on rapid A. Flutter) No murmurs, rubs, or gallops appreciated. No bruits, equal pulses. Lungs: Slightly diminished breath sounds. Expiratory wheezes bilaterally.Scattered crackles bilaterally. Normal upper airway sounds without evidence of stridor. Abdomen: Abdomen soft, non-tender and not  distended, normoactive bowel sounds. No hepatosplenomegaly or masses. Musculoskeletal: No clubbing or synovitis. No LE edema.  Skin: No rashes or lesions Neuro: No focal neurologic deficits.   LABS:  CBC  Recent Labs Lab 07/17/13 1549 07/17/13 1558 07/18/13 0730  WBC 8.9  --  6.0  HGB 12.6* 15.0 13.2  HCT 39.9 44.0 40.4  PLT 182  --  186   Coag's  Recent Labs Lab 07/17/13 1549 07/17/13 2300 07/18/13 0954  INR 2.13* 2.04* 1.88*   BMET  Recent Labs Lab 07/17/13 1549 07/17/13 1558 07/18/13 0730 07/18/13 1400  NA 151* 144 143 140  K 5.1 4.6 5.6* 4.6  CL 94* 103 100 99  CO2 25  --  27 25  BUN 18 18 19 20   CREATININE 0.78 0.80 0.71 0.75  GLUCOSE 170* 168* 117* 168*   Electrolytes  Recent Labs Lab 07/17/13 1549 07/18/13 0730 07/18/13 1400  CALCIUM 9.2 9.3 9.3   Sepsis Markers  Recent Labs Lab 07/17/13 1557  LATICACIDVEN 1.64   ABG  Recent Labs Lab 07/18/13 1814  PHART 7.353  PCO2ART 51.9*  PO2ART 70.5*   Liver Enzymes  Recent Labs Lab 07/17/13 1549  AST 31  ALT 21  ALKPHOS 77  BILITOT 0.3  ALBUMIN 3.3*   Cardiac Enzymes  Recent Labs Lab 07/17/13 1549 07/18/13 0255 07/18/13 0954 07/18/13 1240  TROPONINI  --  <0.30 <0.30 <0.30  PROBNP 5636.0*  --   --   --    Glucose No results found for this basename: GLUCAP,  in the last 168 hours  Imaging Dg Chest Portable 1 View  07/17/2013   CLINICAL DATA:  SHORTNESS OF BREATH  EXAM: PORTABLE CHEST - 1 VIEW  COMPARISON:  05/05/2013   FINDINGS: Mild cardiomegaly. Coarse bronchovascular markings as before. No focal airspace consolidation. Stable left infrahilar subsegmental atelectasis or scarring. Atheromatous aorta. No effusion. Visualized skeletal structures are unremarkable.  IMPRESSION: 1. Stable cardiomegaly and chronic interstitial changes without acute or superimposed abnormality.   Electronically Signed   By: Arne Cleveland M.D.   On: 07/17/2013 15:56     CXR:  No parenchymal infiltrates.   ASSESSMENT / PLAN:  PULMONARY A: 1) Acute COPD exacerbation 2) Doubt real aspiration pneumonia with no fever, WBC and clear chest X ray. May not need clindamycin 3) Patient with hypercarbia but normal pH. Consider BiPAP if respiratory acidosis. He is hypoxemic but he may have chronic component of hypoxemia. His O2 sat goal should be around 94%. Now 98% on 2 l [place] P:   - Agree with Levaquin - Continue Duonebs and albuterol - Switch to IV solumedrol (60 mg BID) since he failed his swallow study - Goal O2 sat around 94% - BiPAP if uncompensated respiratory acidosis  CARDIOVASCULAR A:  1) Rapid atrial flutter, possibly related to alcohol withdrawal? P:  - Will benefit from better rate control. I would suggest to slightly increase dose of Ativan. Other interventions that would possibly help would be gentle hydration (looks dry on exam) and possibly adding calcium channel blockers. Consider cardiology consult if difficult to control.   NEUROLOGIC A:   1) History of alcohol abuse. Possibly withdrawing.  P:   - Agree with CIWA protocol  - Consider increasing Ativan dose   I have personally obtained a history, examined the patient, evaluated laboratory and imaging results, formulated the assessment and plan and placed orders. CRITICAL CARE: Critical Care Time devoted to patient care services described in this note is 45 minutes.  Waynetta Pean, MD Pulmonary and [place] Pager: [phone]  07/18/2013, 8:47 PM

## 2013-07-18 NOTE — Progress Notes (Signed)
Called by primary Rn for a "second set of eyes".  Patient lying in bed with oxygen via nasal cannula @1 .5lpm, mittens and posey on.  Patient appears to have increased WOB, HR 130's, RR 30's, 110/88.  Md at bedside.  Breath sounds rhonchus bilaterally throughout.  Respiratory therapy called for bipap and ABG.  Rn to call if assistance needed

## 2013-07-18 NOTE — Progress Notes (Signed)
B/P 98/56 (62) hr 65 aflutter, Dr. Lacinda Axon notified.  Lopressor IV held.

## 2013-07-18 NOTE — Progress Notes (Signed)
Interim Progress Note  Called to bedside by RN due to concerns for increased work of breathing and elevated HR.  HR 130-140s; RR mid 30's; 97% on 1.5L O2 by [place]; BP 110/88 Pt alert with posey and mittens for safety, disoriented. Increased work of breathing with belly breathing, supraclavicular retractions. No cyanosis. Prominent rhonchi throughout all fields with end-expiratory wheezing and decreased air movement, particularly at R base.   Tachycardic S1 S2, no murmur or JVD. No edema at all.   A/P: Respiratory distress, worsening in setting of COPD exacerbation.    - Due to concerns of hypoxemia and hypercarbia, will get ABG and start BiPAP. Consulted CCM.  - He failed swallow evaluation earlier today and has copious rhonchi and constant cough, so will add clindamycin to cover for aspiration pneumonia.  - With a history of HFrEF and BNP elevation on admission, exam suggests he is either euvolemic or volume down. Will consider gentle IV hydration if rate continues to be uncontrolled.  - Will continue to follow closely.   Ryan B. Bonner Puna, MD, PGY-1 07/18/2013 7:38 PM

## 2013-07-18 NOTE — Progress Notes (Signed)
Family Medicine Teaching Service Daily Progress Note Intern Pager: [phone]  Patient name: David [name] Medical record number: [id number] Date of birth: 1940-09-20 Age: 73 y.o. Gender: male  Primary Care Eldean Klatt: Lillyen Schow NOT IN SYSTEM Consultants: None Code Status: Currently full code (need clarification from [facility])  Pt Overview and Major Events to Date:  6/27 - Admitted  Assessment and Plan: David [name] is a 73 y.o. male presenting with acute dyspnea. PMH is significant for chronic systolic CHF (EF 99-83%; Echo 08/2012), Atrial flutter on coumadin, HTN, COPD w/ emphysema, CAD.   Acute Dyspnea - Appears to be predominantly from acute COPD exacerbation given diffuse wheezing on exam. Patient may also have a mild component of acute CHF exacerbation given elevated BNP of 5636. No fever. Normal WBC count. Normal lactic acid. Chest ray unremarkable. Received 125 mg of Solu-Medrol and NTG x 2 in route. Received 2, 1 hour CAT 10 mg treatments.  - Continue Duonebs Q4/Albuterol Q2PRN, Flovent, Levaquin. - Prednisone 60 mg daily if passes swallow screen; If not will need to switch to IV Solumedrol  Atrial flutter  - Continue V Metoprolol for rate control  - Will likely transition to PO today if passes swallow screen - Continue Warfarin (per pharm)   HTN  - BP's currently low. - Will continue to monitor closely   Hx of Hypothyroidism  - Not on synthroid currently  - TSH suppressed at 0.237; obtaining FT3 and FT4  Psych - Unclear diagnosis. Patient appears to have baseline dementia.  - Will continue home medications once able to take PO: Cymbalta, Remeron, Invega, Seroquel, Topamax.   CAD, HLD  - Continue statin (lipitor substituted for crestor)   + Hemoccult  - Hemoglobin stable and WNL - Will continue to trend daily   Hyperglycemia  - Elevated on BMP  - A1C Pending  FEN/GI: NPO pending swallow screen; SL IV.  Prophylaxis: Coumadin Per Pharm   Disposition: Pending  clinical improvement.  Subjective:  No acute overnight events.  Objective: Temp:  [97.7 F (36.5 C)-98.4 F (36.9 C)] 97.8 F (36.6 C) (06/28 0804) Pulse Rate:  [35-137] 79 (06/28 0804) Resp:  [17-40] 20 (06/28 0804) BP: (85-133)/(50-91) 85/67 mmHg (06/28 0804) SpO2:  [90 %-100 %] 99 % (06/28 0804) FiO2 (%):  [30 %] 30 % (06/27 1758) Weight:  [145 lb 1 oz (65.8 kg)-147 lb 11.3 oz (67 kg)] 145 lb 1 oz (65.8 kg) (06/28 0500)  Physical Exam: General: chronically ill appearing male, resting in bed, NAD.  Cardiovascular: Tachycardic, Irregular. No murmur noted. Respiratory: Diffuse course wheezing throughout all lung fields. Improved from prior exams. Abdomen: soft, nontender, nondistended. Extremities: No LE edema.  Neuro: Alert. Not oriented to place or time.   Laboratory:  Recent Labs Lab 07/17/13 1549 07/17/13 1558 07/18/13 0730  WBC 8.9  --  6.0  HGB 12.6* 15.0 13.2  HCT 39.9 44.0 40.4  PLT 182  --  186    Recent Labs Lab 07/17/13 1549 07/17/13 1558  NA 151* 144  K 5.1 4.6  CL 94* 103  CO2 25  --   BUN 18 18  CREATININE 0.78 0.80  CALCIUM 9.2  --   PROT 7.4  --   BILITOT 0.3  --   ALKPHOS 77  --   ALT 21  --   AST 31  --   GLUCOSE 170* 168*   BNP (last 3 results)  Recent Labs  01/23/13 1653 05/05/13 2215 07/17/13 1549  PROBNP 1890.0* 1000.0* 5636.0*  Troponin - POC Negative   Lactic Acid - 1.64 (normal)  FOBT - Positive.   EKG - Atrial flutter (rate 114).   VBG: 7.296/55.1/138/26.9  Imaging/Diagnostic Tests:  Dg Chest Portable 1 View  07/17/2013 IMPRESSION: 1. Stable cardiomegaly and chronic interstitial changes without acute or superimposed abnormality.  Coral Spikes, DO 07/18/2013, 8:34 AM PGY-2, [place] Intern pager: [phone], text pages welcome

## 2013-07-18 NOTE — Progress Notes (Addendum)
ANTICOAGULATION CONSULT NOTE - Follow Up Consult  Pharmacy Consult for Coumadin Indication: atrial fibrillation  Allergies  Allergen Reactions  . Oat Flour [Oat]     Unknown per MAR  . Other     Greens: unknown reaction per [facility]    Patient Measurements: Height: 6' (182.9 cm) Weight: 145 lb 1 oz (65.8 kg) IBW/kg (Calculated) : 77.6  Vital Signs: Temp: 98.4 F (36.9 C) (06/28 1511) Temp src: Oral (06/28 1511) BP: 109/71 mmHg (06/28 1245) Pulse Rate: 107 (06/28 1245)  Labs:  Recent Labs  07/17/13 1549 07/17/13 1558 07/17/13 2300 07/18/13 0255 07/18/13 0730 07/18/13 0954 07/18/13 1240 07/18/13 1400  HGB 12.6* 15.0  --   --  13.2  --   --   --   HCT 39.9 44.0  --   --  40.4  --   --   --   PLT 182  --   --   --  186  --   --   --   LABPROT 23.8*  --  23.0*  --   --  21.6*  --   --   INR 2.13*  --  2.04*  --   --  1.88*  --   --   CREATININE 0.78 0.80  --   --  0.71  --   --  0.75  TROPONINI  --   --   --  <0.30  --  <0.30 <0.30  --     Estimated Creatinine Clearance: 76.5 ml/min (by C-G formula based on Cr of 0.75).  Assessment:   INR 2.13->1.88, now subtherapeutic.  Unable to take Coumadin (or other meds) last night due to lethargy.    Home Coumadin regimen 5 mg daily. Last dose 6/26.     Failed swallow evalutaion, to remain NPO today.  Spoke briefly with Dr. Bonner Puna.  Hopeful that he will be able to resume PO meds tomorrow.  With + FOB on admit, no Heparin or Lovenox for now. IV Protonix added.  Goal of Therapy:  INR 2-3 Monitor platelets by anticoagulation protocol: Yes   Plan:   Hold Coumadin tonight, due to NPO status.  Continue daily PT/INR.  Will follow up on 6/29.  Arty Baumgartner, [place] Pager: [phone] 07/18/2013,3:33 PM  Addendum: Adding LMWH until INR at goal and while NPO.  Renal function good and H/H is good - no bleeding.  Plan: Lovenox 65 mg SQ every 12 hours. CBC every 72 hours Monitor for s/s of bleeding  Rober Minion, PharmD.,  MS Clinical Pharmacist Pager:  [phone] Thank you for allowing pharmacy to be part of this patients care team.

## 2013-07-18 NOTE — H&P (Signed)
FMTS ATTENDING ADMISSION NOTE Kehinde Eniola,MD I  have seen and examined this patient, reviewed their chart. I have discussed this patient with the resident. I agree with the resident's findings, assessment and care plan.  Patient is a 73 Y/O M with PMX of CAD, COPD,Ischemic cardiomyopathy,HTN,hypothyroidism,Atrial flutter, was brought in from the nursing home for SOB, patient cannot give adequate history, he stated he was asked to come here. He stated he does feel short winded but with the O2 via [place] in place he feels better. He denies chest pain, no wheezing.  No current facility-administered medications on file prior to encounter.   Current Outpatient Prescriptions on File Prior to Encounter  Medication Sig Dispense Refill  . beclomethasone (QVAR) 40 MCG/ACT inhaler Inhale 2 puffs into the lungs 2 (two) times daily.      . DULoxetine (CYMBALTA) 60 MG capsule Take 60 mg by mouth daily.      . folic acid (FOLVITE) 1 MG tablet Take 1 tablet (1 mg total) by mouth daily.  30 tablet  1  . guaiFENesin (MUCINEX) 600 MG 12 hr tablet Take 1 tablet (600 mg total) by mouth 2 (two) times daily.      Marland Kitchen lactulose (CHRONULAC) 10 GM/15ML solution Take 20 g by mouth daily.      . mirtazapine (REMERON) 15 MG tablet Take 15 mg by mouth at bedtime.      . paliperidone (INVEGA) 6 MG 24 hr tablet Take 6 mg by mouth daily.       . QUEtiapine (SEROQUEL) 50 MG tablet Take 50 mg by mouth 2 (two) times daily.      . rosuvastatin (CRESTOR) 20 MG tablet Take 20 mg by mouth daily.      Marland Kitchen thiamine (VITAMIN B-1) 100 MG tablet Take 100 mg by mouth daily.      Marland Kitchen tiotropium (SPIRIVA) 18 MCG inhalation capsule Place 18 mcg into inhaler and inhale daily.      Marland Kitchen topiramate (TOPAMAX) 25 MG tablet Take 25 mg by mouth at bedtime.       Marland Kitchen warfarin (COUMADIN) 5 MG tablet Take 5 mg by mouth every evening. 5pm       Past Medical History  Diagnosis Date  . Community acquired pneumonia     Right middle lobe community-acquired  pneumonia  . Toxic metabolic encephalopathy   . Chronic atrial flutter     a. rate controlled with bb therapy;  b. h/o bradycardia when on digoxin 12/2011.  . Tobacco abuse   . History of coronary artery disease 2008    a. Cath at [facility]: RCA 95%, RI 60%  - medical therapy  . Ischemic cardiomyopathy     a. prev documented EF of 20-30%;  b. 09/2012 Echo: EF 40-45%, diff HK, mild MR, mild bi-atrial enlargement.  . History of prostate cancer   . Chronic obstructive pulmonary disease     with mild bronchospasm, improved  . Hypertension   . History of alcohol abuse     History of prior heavy alcohol abuse  . History of depression   . Altered mental status 2012   . Noncompliance   . Depression   . Ulcer   . Hx of migraines   . Hyperlipidemia   . Colon cancer    Filed Vitals:   07/18/13 1057 07/18/13 1140 07/18/13 1245 07/18/13 1511  BP:  110/77 109/71   Pulse:  136 107   Temp:  97.9 F (36.6 C)  98.4 F (36.9 C)  TempSrc:  Oral  Oral  Resp:  24 28   Height:      Weight:      SpO2: 93% 97% 97%    Exam: Gen: Awake and alert,not in distress. Resp: Air entry equal but reduced B/L, + global wheezing, no crackles. Heart: S1 S2 normal,no murmur, irregular rhythm. Abd: Soft, NT/ND, BS+ Ext: No edema.  A/P: 73 Y/O M with  1. SOB: COPD/CHF Exacerbation.    Having O2 requirement.    ProBNP of 5000 plus.    Chest xray reviewed with no acute abnormality.    Cycle troponin, BMet.    Currently on O2 via [place].    I agree with Duoneb, prednisone 50 mg and Flovent.    Last Echo in 2014 with EF of 40-45%.    Recommending cardiology consult if clinical status worsen. Patient does not appear to be fluid overload.  2. Aflutter;     Clinically stable.     Continue Metoprolol for rate control.     Caution with anticoagulant in patient with + Stool guaiac.     Monitor Hgb if dropping to D/C anticoagulant.  3. HTN/Hypothyroidism/HLD: Review home medications and continue as  appropriate.

## 2013-07-18 NOTE — ED Provider Notes (Signed)
I have personally seen and examined the patient.  I have discussed the plan of care with the resident.  I have reviewed the documentation on PMH/FH/Soc. History.  I have reviewed the documentation of the resident and agree.  Orlie Dakin, MD 07/18/13 [phone]

## 2013-07-18 NOTE — Progress Notes (Signed)
FMTS ATTENDING  NOTE Kehinde Eniola,MD I  have seen and examined this patient, reviewed their chart. I have discussed this patient with the resident. I agree with the resident's findings, assessment and care plan. 

## 2013-07-18 NOTE — Progress Notes (Signed)
Dr. Gillian Shields at bedside (I called with concerns about pt's HR and respiratory status). Connye Burkitt, RN

## 2013-07-18 NOTE — Progress Notes (Addendum)
Notified Dr. Bonner Puna that pt failed swallow eval. Connye Burkitt, RN

## 2013-07-18 NOTE — Progress Notes (Signed)
Pt hr 130's 140's aflutter CIWA 14 paged md who increased prn Ativan pt HR now 100-110's aflutter Dr Burton Apley said to call back if HR is sustaining 130's when pt at rest will continue to monitor

## 2013-07-18 NOTE — Progress Notes (Signed)
Notified Dr. Bonner Puna that pt's HR has been sustaining in 120s-140s. New order received to increase scheduled Lopressor -- 1800 dose to be given when IV access is re-established. Connye Burkitt, RN

## 2013-07-19 LAB — BASIC METABOLIC PANEL
BUN: 25 mg/dL — ABNORMAL HIGH (ref 6–23)
CO2: 27 meq/L (ref 19–32)
Calcium: 9.4 mg/dL (ref 8.4–10.5)
Chloride: 100 mEq/L (ref 96–112)
Creatinine, Ser: 0.84 mg/dL (ref 0.50–1.35)
GFR calc Af Amer: >90 mL/min (ref >90)
GFR calc non Af Amer: 85 mL/min — ABNORMAL LOW (ref >90)
GLUCOSE: 140 mg/dL — AB (ref 70–99)
POTASSIUM: 5.4 meq/L — AB (ref 3.7–5.3)
SODIUM: 139 meq/L (ref 137–147)

## 2013-07-19 LAB — CBC
HCT: 42.5 % (ref 39.0–52.0)
Hemoglobin: 13.8 g/dL (ref 13.0–17.0)
MCH: 30.2 pg (ref 26.0–34.0)
MCHC: 32.5 g/dL (ref 30.0–36.0)
MCV: 93 fL (ref 78.0–100.0)
Platelets: 189 10*3/uL (ref 150–400)
RBC: 4.57 MIL/uL (ref 4.22–5.81)
RDW: 15.3 % (ref 11.5–15.5)
WBC: 8.3 10*3/uL (ref 4.0–10.5)

## 2013-07-19 LAB — PROTIME-INR
INR: 1.49 (ref 0.00–1.49)
Prothrombin Time: 18 seconds — ABNORMAL HIGH (ref 11.6–15.2)

## 2013-07-19 MED ORDER — RISPERIDONE 0.5 MG PO TBDP
0.5000 mg | ORAL_TABLET | Freq: Two times a day (BID) | ORAL | Status: DC
  Administered 2013-07-19 – 2013-07-20 (×2): 0.5 mg via ORAL
  Filled 2013-07-19 (×5): qty 1

## 2013-07-19 MED ORDER — HALOPERIDOL LACTATE 5 MG/ML IJ SOLN: 2.0000 mg | Freq: Four times a day (QID) | INTRAMUSCULAR | Status: DC

## 2013-07-19 MED ORDER — BIOTENE DRY MOUTH MT LIQD
15.0000 mL | Freq: Two times a day (BID) | OROMUCOSAL | Status: DC
  Administered 2013-07-19 – 2013-07-22 (×7): 15 mL via OROMUCOSAL

## 2013-07-19 MED ORDER — HALOPERIDOL LACTATE 5 MG/ML IJ SOLN: 1.0000 mg | Freq: Four times a day (QID) | INTRAMUSCULAR | Status: DC | PRN

## 2013-07-19 MED ORDER — IPRATROPIUM-ALBUTEROL 0.5-2.5 (3) MG/3ML IN SOLN
3.0000 mL | Freq: Four times a day (QID) | RESPIRATORY_TRACT | Status: DC
Start: 2013-07-19 — End: 2013-07-20
  Administered 2013-07-19 – 2013-07-20 (×4): 3 mL via RESPIRATORY_TRACT
  Filled 2013-07-19 (×4): qty 3

## 2013-07-19 MED ORDER — CHLORHEXIDINE GLUCONATE 0.12 % MT SOLN
15.0000 mL | Freq: Two times a day (BID) | OROMUCOSAL | Status: DC
Start: 2013-07-19 — End: 2013-07-22
  Administered 2013-07-19 – 2013-07-22 (×7): 15 mL via OROMUCOSAL
  Filled 2013-07-19 (×11): qty 15

## 2013-07-19 NOTE — Progress Notes (Signed)
Paged on-call MD regarding pt's lack of documented UOP since [age] y/d. Bladder Scan=425 @0500 . I and O Cath ordered but pt voided 229mL prior to cath. Cath not performed. Will continue to monitor and assess.

## 2013-07-19 NOTE — Progress Notes (Signed)
INITIAL NUTRITION ASSESSMENT  DOCUMENTATION CODES Per approved criteria  -Not Applicable   INTERVENTION:  Diet advancement as able per SLP.  If unable to safely begin PO diet soon, consider placing NGT for TF. Jevity 1.2 at 65 ml/h would provide 1872 kcals, 87 gm protein, 1264 ml free water daily.  NUTRITION DIAGNOSIS: Inadequate oral intake related to inability to eat with dysphagia as evidenced by NPO status.   Goal: Intake to meet >90% of estimated nutrition needs.  Monitor:  Diet advancement, PO intake, labs, weight trend.  Reason for Assessment: Low Braden  73 y.o. male  Admitting Dx: COPD exacerbation  ASSESSMENT: 73 year old male with PMH relevant for CHF with LVEF 40 to 45%, atrial flutter on coumadin, HTN, COPD, CAD and heavy alcohol use with last drink a few days ago. Presented with progressive SOB and wheezing. His chest X ray at admission was clear.   S/P bedside swallow evaluation with SLP this AM; high aspiration risk with recommendation to remain NPO.   Given history of alcohol abuse, may be withdrawing. CIWA protocol in place. Suspect intake of protein was low PTA. Patient reports that he was eating well. Severe depletion of muscle mass noted.   Nutrition Focused Physical Exam:  Subcutaneous Fat:  Orbital Region: mild depletion Upper Arm Region: mild depletion Thoracic and Lumbar Region: mild depletion  Muscle:  Temple Region: mild depletion Clavicle Bone Region: moderate depletion Clavicle and Acromion Bone Region: mild depletion Scapular Bone Region: mild depletion Dorsal Hand: severe depletion Patellar Region: severe depletion Anterior Thigh Region: moderate depletion Posterior Calf Region: moderate depletion  Edema: none    Height: Ht Readings from Last 1 Encounters:  07/17/13 6' (1.829 m)    Weight: Wt Readings from Last 1 Encounters:  07/19/13 143 lb 1.3 oz (64.9 kg)    Ideal Body Weight: 80.9 kg  % Ideal Body Weight: 80%  Wt  Readings from Last 10 Encounters:  07/19/13 143 lb 1.3 oz (64.9 kg)  03/05/13 147 lb (66.679 kg)  01/27/13 136 lb 7.4 oz (61.9 kg)  12/23/12 141 lb (63.957 kg)  11/06/12 136 lb (61.689 kg)  10/02/12 132 lb (59.875 kg)  09/25/12 132 lb 14.4 oz (60.283 kg)  04/06/12 142 lb (64.411 kg)  12/30/11 142 lb 11.2 oz (64.728 kg)  08/21/11 153 lb (69.4 kg)    Usual Body Weight: 136-147 lb; 190 lb per patient (unsure when)  % Usual Body Weight: 100%  BMI:  Body mass index is 19.4 kg/(m^2).  Estimated Nutritional Needs: Kcal: 1800-2000 Protein: 85-100 gm Fluid: 1.8-2 L  Skin: WDL  Diet Order: NPO  EDUCATION NEEDS: -Education not appropriate at this time   Intake/Output Summary (Last 24 hours) at 07/19/13 1339 Last data filed at 07/19/13 1144  Gross per 24 hour  Intake    256 ml  Output    450 ml  Net   -194 ml    Last BM: 6/27   Labs:   Recent Labs Lab 07/18/13 0730 07/18/13 1400 07/19/13 0317  NA 143 140 139  K 5.6* 4.6 5.4*  CL 100 99 100  CO2 27 25 27   BUN 19 20 25*  CREATININE 0.71 0.75 0.84  CALCIUM 9.3 9.3 9.4  GLUCOSE 117* 168* 140*    CBG (last 3)  No results found for this basename: GLUCAP,  in the last 72 hours  Scheduled Meds: . antiseptic oral rinse  15 mL Mouth Rinse q12n4p  . atorvastatin  40 mg Oral q1800  .  chlorhexidine  15 mL Mouth Rinse BID  . clindamycin (CLEOCIN) IV  300 mg Intravenous 4 times per day  . DULoxetine  60 mg Oral Daily  . enoxaparin (LOVENOX) injection  65 mg Subcutaneous Q12H  . fluticasone  2 puff Inhalation BID  . folic acid  1 mg Oral Daily  . ipratropium-albuterol  3 mL Nebulization Q6H  . levofloxacin (LEVAQUIN) IV  750 mg Intravenous Q24H  . methylPREDNISolone (SOLU-MEDROL) injection  60 mg Intravenous Q24H  . metoprolol  10 mg Intravenous 4 times per day  . mirtazapine  15 mg Oral QHS  . multivitamin with minerals  1 tablet Oral Daily  . paliperidone  6 mg Oral Daily  . pantoprazole (PROTONIX) IV  40 mg  Intravenous Q24H  . QUEtiapine  50 mg Oral BID  . sodium chloride  3 mL Intravenous Q12H  . sodium chloride  3 mL Intravenous Q12H  . thiamine  100 mg Oral Daily   Or  . thiamine  100 mg Intravenous Daily  . topiramate  25 mg Oral QHS    Continuous Infusions: . albuterol Stopped (07/17/13 1916)  . albuterol      Past Medical History  Diagnosis Date  . Community acquired pneumonia     Right middle lobe community-acquired pneumonia  . Toxic metabolic encephalopathy   . Chronic atrial flutter     a. rate controlled with bb therapy;  b. h/o bradycardia when on digoxin 12/2011.  . Tobacco abuse   . History of coronary artery disease 2008    a. Cath at [facility]: RCA 95%, RI 60%  - medical therapy  . Ischemic cardiomyopathy     a. prev documented EF of 20-30%;  b. 09/2012 Echo: EF 40-45%, diff HK, mild MR, mild bi-atrial enlargement.  . History of prostate cancer   . Chronic obstructive pulmonary disease     with mild bronchospasm, improved  . Hypertension   . History of alcohol abuse     History of prior heavy alcohol abuse  . History of depression   . Altered mental status 2012   . Noncompliance   . Depression   . Ulcer   . Hx of migraines   . Hyperlipidemia   . Colon cancer     Past Surgical History  Procedure Laterality Date  . Appendectomy      Molli Barrows, RD, LDN, [place] Pager [phone] After Hours Pager [phone]

## 2013-07-19 NOTE — Progress Notes (Addendum)
Speech Language Pathology Treatment: Dysphagia  Patient Details Name: David [name] MRN: [id number] DOB: 01-30-1940 Today's Date: 07/19/2013 Time: 8832-5498 SLP Time Calculation (min): 15 min  Assessment / Plan / Recommendation Clinical Impression  Diagnostic po trials provided by SLP. Patient with notable audible inhalations and exhalations at baseline, increasing in intensity as po trials progressed. Patient with overt indication of aspiration with thin liquids via cups sips however overall without coughing, throat clearing, or changes in vocal quality with ice chip and pureed solid trials. SLP provided patient with max cues for strong cough in attempts to clear notable upper airway congestion in attempts to minimize confusion between baseline congestion and signs of aspiration without success given general weakness. Although patient appeared to be protecting airway with clinician controlled boluses, aspiration risk high across consistencies given respiratory deficits likely impacting ability to close off airway for safe swallow. SLP will continue to f/u for readiness for pos. Suspect instrumental testing will be beneficial given h/o dysphagia and current medical status.    HPI HPI: Pavle Wiler is a 73 y.o. male presenting with acute dyspnea. PMH is significant for chronic systolic CHF (EF 26-41%; Echo 08/2012), Atrial flutter on coumadin, HTN, COPD w/ emphysema, CAD.       SLP Plan  Continue with current plan of care    Recommendations Diet recommendations: NPO Medication Administration: Via alternative means              Oral Care Recommendations:  (QID) Follow up Recommendations:  (TBD) Plan: Continue with current plan of care    [place], [place] [phone]   McCoy Leah Meryl 07/19/2013, 10:50 AM

## 2013-07-19 NOTE — Progress Notes (Signed)
PULMONARY / CRITICAL CARE MEDICINE   Name: David [name] MRN: [id number] DOB: 1940/08/10    ADMISSION DATE:  07/17/2013 CONSULTATION DATE:  07/18/13  REFERRING MD :  Vance Gather, MD PRIMARY SERVICE: Family Medicine  CHIEF COMPLAINT:  COPD exacerbation  BRIEF PATIENT DESCRIPTION:  73 years old male with PMH relevant for CHF with LVEF 40 to 45%, atrial flutter on coumadin, HTN, COPD, CAD and heavy alcohol use with last drink a few days ago. Presented with progressive SOB and wheezing. His chest X ray at admission is clear. PCCM consult called to assist with management of hypoxemia and hypercarbia.   SIGNIFICANT EVENTS / STUDIES:  Chest X ray: 1. Stable cardiomegaly and chronic interstitial changes without  acute or superimposed abnormality.   LINES / TUBES: - Peripheral IV's  CULTURES:   ANTIBIOTICS: Levaquin / Clindamycin  SUBJECTIVE: No events overnight, continues to complain of SOB  VITAL SIGNS: Temp:  [97.4 F (36.3 C)-98.4 F (36.9 C)] 97.6 F (36.4 C) (06/29 0800) Pulse Rate:  [41-144] 42 (06/29 0600) Resp:  [17-38] 32 (06/29 0600) BP: (85-134)/(60-88) 97/81 mmHg (06/29 0600) SpO2:  [85 %-100 %] 100 % (06/29 0747) Weight:  [64.9 kg (143 lb 1.3 oz)] 64.9 kg (143 lb 1.3 oz) (06/29 0500) HEMODYNAMICS:   VENTILATOR SETTINGS:   INTAKE / OUTPUT: Intake/Output     06/28 0701 - 06/29 0700 06/29 0701 - 06/30 0700   I.V. (mL/kg)  6 (0.1)   IV Piggyback 250    Total Intake(mL/kg) 250 (3.9) 6 (0.1)   Urine (mL/kg/hr) 250 (0.2) 100 (0.5)   Total Output 250 100   Net 0 -94        Urine Occurrence 3 x     PHYSICAL EXAMINATION: General: Male patient in no acute distress. Eyes: Anicteric sclerae. ENT: Oropharynx clear. Dry mucous membranes. No thrush Lymph: No cervical, supraclavicular, or axillary lymphadenopathy. Heart: Regular rhythm (on rapid A. Flutter) No murmurs, rubs, or gallops appreciated. No bruits, equal pulses. Lungs: End exp wheezing  bilaterally. Abdomen: Abdomen soft, non-tender and not distended, normoactive bowel sounds. No hepatosplenomegaly or masses. Musculoskeletal: No clubbing or synovitis. No LE edema.  Skin: No rashes or lesions Neuro: No focal neurologic deficits.  LABS:  CBC  Recent Labs Lab 07/17/13 1549 07/17/13 1558 07/18/13 0730 07/19/13 0317  WBC 8.9  --  6.0 8.3  HGB 12.6* 15.0 13.2 13.8  HCT 39.9 44.0 40.4 42.5  PLT 182  --  186 189   Coag's  Recent Labs Lab 07/17/13 2300 07/18/13 0954 07/19/13 0317  INR 2.04* 1.88* 1.49   BMET  Recent Labs Lab 07/18/13 0730 07/18/13 1400 07/19/13 0317  NA 143 140 139  K 5.6* 4.6 5.4*  CL 100 99 100  CO2 27 25 27   BUN 19 20 25*  CREATININE 0.71 0.75 0.84  GLUCOSE 117* 168* 140*   Electrolytes  Recent Labs Lab 07/18/13 0730 07/18/13 1400 07/19/13 0317  CALCIUM 9.3 9.3 9.4   Sepsis Markers  Recent Labs Lab 07/17/13 1557  LATICACIDVEN 1.64   ABG  Recent Labs Lab 07/18/13 1814  PHART 7.353  PCO2ART 51.9*  PO2ART 70.5*   Liver Enzymes  Recent Labs Lab 07/17/13 1549  AST 31  ALT 21  ALKPHOS 77  BILITOT 0.3  ALBUMIN 3.3*   Cardiac Enzymes  Recent Labs Lab 07/17/13 1549 07/18/13 0255 07/18/13 0954 07/18/13 1240  TROPONINI  --  <0.30 <0.30 <0.30  PROBNP 5636.0*  --   --   --  Glucose No results found for this basename: GLUCAP,  in the last 168 hours  Imaging Dg Chest Portable 1 View  07/17/2013   CLINICAL DATA:  SHORTNESS OF BREATH  EXAM: PORTABLE CHEST - 1 VIEW  COMPARISON:  05/05/2013  FINDINGS: Mild cardiomegaly. Coarse bronchovascular markings as before. No focal airspace consolidation. Stable left infrahilar subsegmental atelectasis or scarring. Atheromatous aorta. No effusion. Visualized skeletal structures are unremarkable.  IMPRESSION: 1. Stable cardiomegaly and chronic interstitial changes without acute or superimposed abnormality.   Electronically Signed   By: Arne Cleveland M.D.   On:  07/17/2013 15:56     CXR:  No parenchymal infiltrates.   ASSESSMENT / PLAN:  PULMONARY A: 1) Acute COPD exacerbation 2) Doubt real aspiration pneumonia with no fever, WBC and clear chest X ray. May not need clindamycin 3) Patient with hypercarbia but normal pH. Consider BiPAP if respiratory acidosis. He is hypoxemic but he may have chronic component of hypoxemia. His O2 sat goal should be around 94%. Now 98% on 2 l [place] P:   - Continue Levaquin - Continue Duonebs and albuterol - Switched to IV solumedrol (60 mg BID) since he failed his swallow study - Goal O2 sat around 94% - Hold off BiPAP for now  CARDIOVASCULAR A:  1) Rapid atrial flutter, possibly related to alcohol withdrawal? P:  - Recommend keeping on the dry side given cardiac history.   NEUROLOGIC A:   1) History of alcohol abuse. Possibly withdrawing.  P:   - Agree with CIWA protocol  - Monitor for DTs.  I have personally obtained a history, examined the patient, evaluated laboratory and imaging results, formulated the assessment and plan and placed orders.  Rush Farmer, M.D. [facility] Pulmonary/Critical Care Medicine. Pager: [phone]. After hours pager: [phone].  07/19/2013, 10:12 AM

## 2013-07-19 NOTE — Progress Notes (Signed)
CALL PAGER [phone] for any questions or notifications regarding this patient   FMTS Attending Daily Note: [name] MD  Attending pager:[phone]  office [phone]  I  have seen and examined this patient, reviewed their chart. I have discussed this patient with the resident. I agree with the resident's findings, assessment and care plan. 

## 2013-07-19 NOTE — Progress Notes (Signed)
Family Medicine Teaching Service Daily Progress Note Intern Pager: [phone]  Patient name: David [name] Medical record number: [id number] Date of birth: October 14, 1940 Age: 73 y.o. Gender: male  Primary Care Provider: PROVIDER NOT IN SYSTEM Consultants: None Code Status: DNR (confirmed with [facility] - faxed documentation)  Pt Overview and Major Events to Date:  6/27 - Admitted  Assessment and Plan: Chance Munter is a 73 y.o. male presenting with acute dyspnea. PMH is significant for chronic systolic CHF (EF 25-85%; Echo 08/2012), Atrial flutter on coumadin, HTN, COPD w/ emphysema, CAD.   # Dyspnea - multifactorial, secondary to Acute COPD exacerbation and Acute on Chronic systolic CHF exac - On admission, suspected predominantly due to COPD with diffuse wheezing / coarse breath sounds, however elevated BNP 5636. Afebrile, Normal WBC count. Normal lactic acid. Chest ray unremarkable. Received 125 mg of Solu-Medrol and NTG x 2 in route. Received 2, 1 hour CAT 10 mg treatments. - Currently remains in SDU - Transitioned back to 2L [place], previously required BiPAP overnight due to tachypnea and hypoxemia - ABG with nml pH 7.353, CO 51.9, O2 70.5, Bicarb 28.1 - Failed SLP swallow study - s/p Lasix 40mg  IV x 1 dose (good UOP 1.6 L) - Continue Duonebs Q4/Albuterol Q2PRN, Flovent - Levaquin IV for COPD exac - empiric Clindamycin IV for possible aspiration PNA, given failed swallow study (however afebrile, nml WBC, CXR neg) - Consulted PCCM - re: hypoxemia / hypercarbia / respiratory distress - greatly appreciate assistance     - Switched Prednisone to Solumedrol 60mg  BID     -  BiPAP PRN if uncompensated resp acidosis  Atrial flutter  - Persistent tachycardia with atrial fib - Continue V Metoprolol 10mg  q 6 hr for rate control  - Consider adding Diltiazem gtt - Unable to transition to PO - Continue Warfarin (per pharm)  - Consider consult Cardiology  History of alcohol abuse. Possibly  withdrawing.  - CIWA protocol  - Consider increasing Ativan dose  HTN  - BP's currently low. - Will continue to monitor closely   Hx of Hypothyroidism  - Not on synthroid currently  - TSH suppressed at 0.237; FT3 (2.7 nml) and FT4 (0.91, nml)  Psych - Unclear diagnosis. Patient appears to have baseline dementia. History of depression - Will continue home medications once able to take PO: Cymbalta, Remeron, Invega, Seroquel, Topamax. - Ordered Risperdone (M-tabs, dissolving) 0.5mg  BID, may increase to 1mg  BID if not effective - Ordered Haldol IV 1mg  q 6 hr PRN - Discussed with pharmacy regarding above alternative meds to complex home PO regimen  CAD, HLD  - Continue statin (lipitor substituted for crestor)   + Hemoccult  - Hemoglobin stable and WNL - Will continue to trend daily   Hyperglycemia  - Elevated on BMP  - A1C 7.0  FEN/GI: NPO (failed SLP swallow eval); SL IV.  Prophylaxis: Coumadin Per Pharm   Disposition: Pending clinical improvement.  Subjective:  Overnight with increased work of breathing and inc HR, ABG with hypercarbia, started BiPAP, consulted CCM. This morning patient off of BiPAP, tolerating 2L [place] at 100%. Coarse breath sounds at a distance, with occasional cough. Unable to provide history due to dementia. Mumbled several words, however follows commands.  Objective: Temp:  [97.4 F (36.3 C)-98.4 F (36.9 C)] 97.8 F (36.6 C) (06/29 0330) Pulse Rate:  [41-144] 42 (06/29 0600) Resp:  [17-38] 32 (06/29 0600) BP: (85-134)/(60-88) 97/81 mmHg (06/29 0600) SpO2:  [85 %-100 %] 100 % (06/29 0747) Weight:  [  143 lb 1.3 oz (64.9 kg)] 143 lb 1.3 oz (64.9 kg) (06/29 0500)  Physical Exam: General: chronically ill appearing male, resting in bed, NAD.  Cardiovascular: Tachycardic, Irregular. No murmur noted. Respiratory: Diffuse coarse breath sounds with rhonchi and scattered wheezes throughout all lung fields. No significant respiratory distress, mild  tachypnea. Abdomen: soft, NTND, +active BS. Extremities: No LE edema.  Neuro: Alert, awake, Not oriented to place or time. Follows most commands, grossly non-focal  Laboratory:  Recent Labs Lab 07/17/13 1549 07/17/13 1558 07/18/13 0730 07/19/13 0317  WBC 8.9  --  6.0 8.3  HGB 12.6* 15.0 13.2 13.8  HCT 39.9 44.0 40.4 42.5  PLT 182  --  186 189    Recent Labs Lab 07/17/13 1549  07/18/13 0730 07/18/13 1400 07/19/13 0317  NA 151*  < > 143 140 139  K 5.1  < > 5.6* 4.6 5.4*  CL 94*  < > 100 99 100  CO2 25  --  27 25 27   BUN 18  < > 19 20 25*  CREATININE 0.78  < > 0.71 0.75 0.84  CALCIUM 9.2  --  9.3 9.3 9.4  PROT 7.4  --   --   --   --   BILITOT 0.3  --   --   --   --   ALKPHOS 77  --   --   --   --   ALT 21  --   --   --   --   AST 31  --   --   --   --   GLUCOSE 170*  < > 117* 168* 140*  < > = values in this interval not displayed. BNP (last 3 results)  Recent Labs  01/23/13 1653 05/05/13 2215 07/17/13 1549  PROBNP 1890.0* 1000.0* 5636.0*   Troponin - POC Negative   Lactic Acid - 1.64 (normal)  FOBT - Positive.   EKG - Atrial flutter (rate 114).   VBG: 7.296/55.1/138/26.9  ABG    Component Value Date/Time   PHART 7.353 07/18/2013 1814   PCO2ART 51.9* 07/18/2013 1814   PO2ART 70.5* 07/18/2013 1814   HCO3 28.1* 07/18/2013 1814   TCO2 29.7 07/18/2013 1814   ACIDBASEDEF 1.0 07/17/2013 1616   O2SAT 92.2 07/18/2013 1814    Imaging/Diagnostic Tests:  Dg Chest Portable 1 View  07/17/2013 IMPRESSION: 1. Stable cardiomegaly and chronic interstitial changes without acute or superimposed abnormality.  Nobie Putnam, DO 07/19/2013, 8:25 AM PGY-1, [place] Intern pager: [phone], text pages welcome

## 2013-07-19 NOTE — Progress Notes (Signed)
Utilization review completed.  

## 2013-07-19 NOTE — Clinical Documentation Improvement (Signed)
Presents with COPD Exacerbation and Acute Respiratory Failure (See ED note).   Concern for CHF documented  Elevated BNP = 5636  Euvolemic or volume down documented in 6/28 Progress Note  EF 40-45% with history of Systolic CHF  Being treated with IV Lopressor 10 mg Q6H  HFrEF documented but can't be coded as written  Please clarify the type and acuity of the CHF being treated: Acute, Chronic, Acute on Chronic                 Systolic, Diastolic, Systolic and Diastolic                 Other Condition Please document findings in next Progress Note and Discharge Summary.  Thank You, Zoila Shutter ,RN Clinical Documentation Specialist:  [place] Information Management

## 2013-07-20 ENCOUNTER — Inpatient Hospital Stay (HOSPITAL_COMMUNITY): Payer: PRIVATE HEALTH INSURANCE

## 2013-07-20 DIAGNOSIS — J438 Other emphysema: Secondary | ICD-10-CM

## 2013-07-20 LAB — BASIC METABOLIC PANEL
BUN: 30 mg/dL — AB (ref 6–23)
CHLORIDE: 103 meq/L (ref 96–112)
CO2: 26 mEq/L (ref 19–32)
Calcium: 9.3 mg/dL (ref 8.4–10.5)
Creatinine, Ser: 0.88 mg/dL (ref 0.50–1.35)
GFR calc non Af Amer: 83 mL/min — ABNORMAL LOW (ref >90)
Glucose, Bld: 146 mg/dL — ABNORMAL HIGH (ref 70–99)
Potassium: 5.1 mEq/L (ref 3.7–5.3)
SODIUM: 142 meq/L (ref 137–147)

## 2013-07-20 LAB — CBC
HEMATOCRIT: 42.9 % (ref 39.0–52.0)
Hemoglobin: 13.9 g/dL (ref 13.0–17.0)
MCH: 30.2 pg (ref 26.0–34.0)
MCHC: 32.4 g/dL (ref 30.0–36.0)
MCV: 93.1 fL (ref 78.0–100.0)
Platelets: 201 10*3/uL (ref 150–400)
RBC: 4.61 MIL/uL (ref 4.22–5.81)
RDW: 15.2 % (ref 11.5–15.5)
WBC: 7.6 10*3/uL (ref 4.0–10.5)

## 2013-07-20 LAB — PROTIME-INR
INR: 1.63 — ABNORMAL HIGH (ref 0.00–1.49)
Prothrombin Time: 19.3 seconds — ABNORMAL HIGH (ref 11.6–15.2)

## 2013-07-20 MED ORDER — IPRATROPIUM-ALBUTEROL 0.5-2.5 (3) MG/3ML IN SOLN
3.0000 mL | Freq: Three times a day (TID) | RESPIRATORY_TRACT | Status: DC
  Administered 2013-07-20 – 2013-07-22 (×7): 3 mL via RESPIRATORY_TRACT
  Filled 2013-07-20 (×7): qty 3

## 2013-07-20 MED ORDER — DULOXETINE HCL 60 MG PO CPEP
60.0000 mg | ORAL_CAPSULE | Freq: Every day | ORAL | Status: DC
  Administered 2013-07-21 – 2013-07-22 (×2): 60 mg via ORAL
  Filled 2013-07-20 (×2): qty 1

## 2013-07-20 MED ORDER — QUETIAPINE FUMARATE 50 MG PO TABS
50.0000 mg | ORAL_TABLET | Freq: Two times a day (BID) | ORAL | Status: DC
  Administered 2013-07-20 – 2013-07-21 (×2): 50 mg via ORAL
  Filled 2013-07-20 (×4): qty 1

## 2013-07-20 MED ORDER — ALBUTEROL SULFATE (2.5 MG/3ML) 0.083% IN NEBU
5.0000 mg | INHALATION_SOLUTION | RESPIRATORY_TRACT | Status: DC | PRN
  Filled 2013-07-20: qty 6

## 2013-07-20 MED ORDER — PALIPERIDONE ER 6 MG PO TB24
6.0000 mg | ORAL_TABLET | Freq: Every day | ORAL | Status: DC
  Administered 2013-07-20 – 2013-07-22 (×3): 6 mg via ORAL
  Filled 2013-07-20 (×3): qty 1

## 2013-07-20 NOTE — Discharge Summary (Signed)
[facility] Discharge Summary  Patient name: David [name] Medical record number: [id number] Date of birth: 04-22-1940 Age: 73 y.o. Gender: male Date of Admission: 07/17/2013  Date of Discharge: 07/22/2013  Admitting Physician: Andrena Mews, MD  Primary Care Provider: PROVIDER NOT IN SYSTEM Consultants: PCCM, Cardiology, Palliative Care  Indication for Hospitalization: Shortness of breath, respiratory distress  Discharge Diagnoses/Problem List:  Acute COPD exacerbation, in setting of suspected chronic ILD and chronic respiratory failure Acute on chronic respiratory failure (required BiPAP) - Resolved Atrial Flutter HTN H/o hypothyroidism H/o alcohol abuse, without evidence of withdrawal H/o Psych, unclear diagnoses, not limited to depression Chronic dementia  Disposition: ALF [place]  Discharge Condition: Stable  Discharge Exam: General: chronically ill appearing male, resting in bed, NAD.  Cardiovascular: Tachycardic, Irregular. No murmur noted.  Respiratory: Improved WOB, diffuse coarse breath sounds with rhonchi and scattered wheezes throughout all lung fields. No respiratory distress.  Abdomen: soft, NTND, +active BS.  Extremities: No LE edema.  Neuro: somnolent, Not oriented to place or time, improved alertness and verbal responses. Follows most commands, grossly non-focal   Brief Hospital Course:  [name] is a 73 y.o. male who presented with acute dyspnea. PMH is significant for chronic systolic CHF (EF 73-41%; Echo 08/2012), Atrial flutter on coumadin, HTN, COPD w/ emphysema, CAD. On admission, suspected predominantly due to COPD with diffuse wheezing / coarse breath sounds, O2 req, however elevated BNP 5636. Afebrile, with nml WBC and nml lactic acid, Troponin (negative). Chest ray unremarkable. Initially s/p Lasix 40mg  IV x 1 dose (good UOP 1.6 L), unlikely acute CHF exacerbation given no clinical evidence of volume overload.  Additionally, on admission, FOBT (positive) without evidence of acute bleeding, stable Hgb 12-13 (remained stable). Admitted to SDU for suspected COPD exacerbation, continued Duonebs / steroids (initially prednisone, transitioned to IV), continued Metoprolol IV for rate control, Coumadin per pharm for anticoagulation, held PO psych meds given concern for sedation, initially failed swallow study.  During hospitalization, patient initially with significant decline within 24-48 hours, worsening respiratory status and increased respiratory distress, ABG indicated normal pH but hypercarbia and hypoxemia, consulted PCCM, recommend placement of BiPAP, transitioned to IV solumedrol, start Levaquin IV for presumed COPD, additionally covered with Clindamycin for possible aspiration PNA (despite afebrile, no WBC, CXR clear). Demonstrated significant improvement within 24 hours, remained off BiPAP, persistent O2 req. Gradual improvement in respiratory status, repeat CXR with no evidence of consolidation or pulmonary congestion, consulted Palliative given poor recovery and overall poor long-term prognosis given chronic respiratory failure and end-stage COPD. Palliative and CM were unable to contact family, despite many attempts.  Patient passed swallow screen and transitioned to PO Levaquin, Metop, and Prednisone on 7/1.  Prednisone stopped on 7/2.  Cardiology was consulted for management of A flutter in setting of HR 130s and BP in 93X-902I systolic.  They recommended titrating BB for rate control.  Unable to rhythm control, because need to avoid dilt in the setting of LV dysfunction and amiodarone with lung disease and patient was bradycardic on digoxin previously. Goal HR is 110 for him and his baseline BP is in 097D systolic.  Issues for Follow Up:  1. Goals of care - Patient's family was unable to be reached despite multiple attempts.  Given multiple comorbidities and chronic interstitial lung disease, a goals of  care discussion may be appropriate.   Significant Procedures: None  Significant Labs and Imaging:   Recent Labs Lab 07/20/13 0805 07/21/13 0335 07/22/13  WBC 7.6  6.3 7.6  HGB 13.9 13.1 13.1  HCT 42.9 40.1 39.8  PLT 201 210 173    Recent Labs Lab 07/17/13 1549  07/18/13 1400 07/19/13 0317 07/20/13 0805 07/21/13 0335 07/22/13  NA 151*  < > 140 139 142 135* 136*  K 5.1  < > 4.6 5.4* 5.1 5.3 4.8  CL 94*  < > 99 100 103 98 100  CO2 25  < > 25 27 26 25 27   GLUCOSE 170*  < > 168* 140* 146* 153* 158*  BUN 18  < > 20 25* 30* 28* 27*  CREATININE 0.78  < > 0.75 0.84 0.88 0.85 0.90  CALCIUM 9.2  < > 9.3 9.4 9.3 9.0 9.0  ALKPHOS 77  --   --   --   --   --   --   AST 31  --   --   --   --   --   --   ALT 21  --   --   --   --   --   --   ALBUMIN 3.3*  --   --   --   --   --   --   < > = values in this interval not displayed.  BNP (last 3 results)   Recent Labs   01/23/13 1653  05/05/13 2215  07/17/13 1549   PROBNP  1890.0*  1000.0*  5636.0*    Troponin - POC Negative  Lactic Acid - 1.64 (normal)  FOBT - Positive.  EKG - Atrial flutter (rate 114).  VBG: 7.296/55.1/138/26.9   ABG    Component  Value  Date/Time    PHART  7.353  07/18/2013 1814    PCO2ART  51.9*  07/18/2013 1814    PO2ART  70.5*  07/18/2013 1814    HCO3  28.1*  07/18/2013 1814    TCO2  29.7  07/18/2013 1814    ACIDBASEDEF  1.0  07/17/2013 1616    O2SAT  92.2  07/18/2013 1814    Dg Chest Portable 1 View  07/17/2013 IMPRESSION: 1. Stable cardiomegaly and chronic interstitial changes without acute or superimposed abnormality.   Results/Tests Pending at Time of Discharge: None  Discharge Medications:    Medication List    STOP taking these medications       QUEtiapine 50 MG tablet  Commonly known as:  SEROQUEL     topiramate 25 MG tablet  Commonly known as:  TOPAMAX      TAKE these medications       beclomethasone 40 MCG/ACT inhaler  Commonly known as:  QVAR  Inhale 2 puffs into the lungs 2  (two) times daily.     DULoxetine 60 MG capsule  Commonly known as:  CYMBALTA  Take 60 mg by mouth daily.     folic acid 1 MG tablet  Commonly known as:  FOLVITE  Take 1 tablet (1 mg total) by mouth daily.     guaiFENesin 600 MG 12 hr tablet  Commonly known as:  MUCINEX  Take 1 tablet (600 mg total) by mouth 2 (two) times daily.     lactulose 10 GM/15ML solution  Commonly known as:  CHRONULAC  Take 20 g by mouth daily.     levofloxacin 750 MG tablet  Commonly known as:  LEVAQUIN  Take 1 tablet (750 mg total) by mouth daily.  Start taking on:  07/23/2013     metoprolol 50 MG tablet  Commonly known as:  [employer]  Take 1 tablet (50 mg total) by mouth 2 (two) times daily.     mirtazapine 15 MG tablet  Commonly known as:  REMERON  Take 15 mg by mouth at bedtime.     multivitamin with minerals Tabs tablet  Take 1 tablet by mouth daily.     paliperidone 6 MG 24 hr tablet  Commonly known as:  INVEGA  Take 6 mg by mouth daily.     PROAIR HFA 108 (90 BASE) MCG/ACT inhaler  Generic drug:  albuterol  Inhale 2 puffs into the lungs every 6 (six) hours as needed for wheezing or shortness of breath.     rosuvastatin 20 MG tablet  Commonly known as:  CRESTOR  Take 20 mg by mouth daily.     thiamine 100 MG tablet  Commonly known as:  VITAMIN B-1  Take 100 mg by mouth daily.     tiotropium 18 MCG inhalation capsule  Commonly known as:  SPIRIVA  Place 18 mcg into inhaler and inhale daily.     warfarin 5 MG tablet  Commonly known as:  COUMADIN  Take 5 mg by mouth every evening. 5pm        Discharge Instructions: Please refer to Patient Instructions section of EMR for full details.  Patient was counseled important signs and symptoms that should prompt return to medical care, changes in medications, dietary instructions, activity restrictions, and follow up appointments.   Follow-Up Appointments:   [name], MD 07/22/2013, 2:03 PM PGY-1, [place]

## 2013-07-20 NOTE — Progress Notes (Signed)
NTS performed per PRN order via left nare.  Small amt of thick white, tan secretions returned.  Site left in tact.  No complications noted.

## 2013-07-20 NOTE — Progress Notes (Signed)
Speech Language Pathology Treatment: Dysphagia  Patient Details Name: David [name] MRN: [id number] DOB: 1940/12/30 Today's Date: 07/20/2013 Time: 6712-4580 SLP Time Calculation (min): 13 min  Assessment / Plan / Recommendation Clinical Impression  Pt demonstrates questionable evidence of aspiration at bedside, Pt has congest respirations making it difficult to determine if there is wet vocal quality. Pt was noted to have delayed coughing after SLP left the room. Objective test warranted to determine best, safe diet for pt. Pt may consume PO meds in puree if necessary. SLP will f/u with MBS later this am.    HPI HPI: David [name] is a 73 y.o. male presenting with acute dyspnea. PMH is significant for chronic systolic CHF (EF 99-83%; Echo 08/2012), Atrial flutter on coumadin, HTN, COPD w/ emphysema, CAD.    Pertinent Vitals NA  SLP Plan  Continue with current plan of care    Recommendations Diet recommendations: Dysphagia 1 (puree) Medication Administration: Whole meds with puree              Plan: Continue with current plan of care    GO    [facility], MA CCC-SLP [phone]  Lynann Beaver 07/20/2013, 9:16 AM

## 2013-07-20 NOTE — Care Management Note (Unsigned)
    Page 1 of 1   07/22/2013     10:13:38 AM CARE MANAGEMENT NOTE 07/22/2013  Patient:  David [name], David [name]   Account Number:  [id number]  Date Initiated:  07/19/2013  Documentation initiated by:  Marvetta Gibbons  Subjective/Objective Assessment:   Pt admitted with COPD     Action/Plan:   PTA pt was at [facility]F   Anticipated DC Date:     Anticipated DC Plan:    In-house referral  Clinical Social Worker         Choice offered to / List presented to:             Status of service:  In process, will continue to follow Medicare Important Message given?  YES (If response is "NO", the following Medicare IM given date fields will be blank) Date Medicare IM given:  07/20/2013 Medicare IM given by:   Date Additional Medicare IM given:  07/22/2013 Additional Medicare IM given by:  [place]  Discharge Disposition:    Per UR Regulation:  Reviewed for med. necessity/level of care/duration of stay  If discussed at [place] of Stay Meetings, dates discussed:   07/22/2013    Comments:  07/20/13- 1500- Marvetta Gibbons RN, BSN [phone] No family present, pt in restraints, copy of admission Medicare IM left on bedside table.

## 2013-07-20 NOTE — Procedures (Signed)
Objective Swallowing Evaluation: Modified Barium Swallowing Study  Patient Details  Name: David [name] MRN: [id number] Date of Birth: 02/15/1940  Today's Date: 07/20/2013 Time: 2706-2376 SLP Time Calculation (min): 21 min  Past Medical History:  Past Medical History  Diagnosis Date  . Community acquired pneumonia     Right middle lobe community-acquired pneumonia  . Toxic metabolic encephalopathy   . Chronic atrial flutter     a. rate controlled with bb therapy;  b. h/o bradycardia when on digoxin 12/2011.  . Tobacco abuse   . History of coronary artery disease 2008    a. Cath at [facility]: RCA 95%, RI 60%  - medical therapy  . Ischemic cardiomyopathy     a. prev documented EF of 20-30%;  b. 09/2012 Echo: EF 40-45%, diff HK, mild MR, mild bi-atrial enlargement.  . History of prostate cancer   . Chronic obstructive pulmonary disease     with mild bronchospasm, improved  . Hypertension   . History of alcohol abuse     History of prior heavy alcohol abuse  . History of depression   . Altered mental status 2012   . Noncompliance   . Depression   . Ulcer   . Hx of migraines   . Hyperlipidemia   . Colon cancer    Past Surgical History:  Past Surgical History  Procedure Laterality Date  . Appendectomy     HPI:  David [name] is a 73 y.o. male presenting with acute dyspnea. PMH is significant for chronic systolic CHF (EF 28-31%; Echo 08/2012), Atrial flutter on coumadin, HTN, COPD w/ emphysema, CAD.      Assessment / Plan / Recommendation Clinical Impression  Dysphagia Diagnosis: Mild oral phase dysphagia;Mild pharyngeal phase dysphagia Clinical impression: Pt presents with a mild oropharyngeal dysphagia, consistent with MBS in 2013. He has prolonged mastication of solids due to missing dentition, but does hold bolus correctly to masticate even regular texture solids. Oropharyngeal phase is characterized by incomplete closure of the vestibule during the swallow with  penetration above the cords with most boluses. Most of penetrate is expelled, but there is a very trace amount remaining, particularly with large consecutive straw sips. A cued throat clear every 3-4 swallow will remove penetrate if it remains. Pt is at moderate aspiration risk if his mental status and respiratory function are further compromised. Pt may initiate a dys 3 (mechanical soft) diet with thin liquids and the below listed strategies. SLP will f/u for reinforcement and tolerance.     Treatment Recommendation  Therapy as outlined in treatment plan below    Diet Recommendation Dysphagia 3 (Mechanical Soft);Thin liquid   Liquid Administration via: No straw;Cup Medication Administration: Crushed with puree Supervision: Staff to assist with self feeding Compensations: Slow rate;Small sips/bites;Clear throat intermittently Postural Changes and/or Swallow Maneuvers: Seated upright 90 degrees    Other  Recommendations Oral Care Recommendations: Oral care BID   Follow Up Recommendations  Skilled Nursing facility    Frequency and Duration min 2x/week  1 week   Pertinent Vitals/Pain NA    SLP Swallow Goals     General HPI: David [name] is a 73 y.o. male presenting with acute dyspnea. PMH is significant for chronic systolic CHF (EF 51-76%; Echo 08/2012), Atrial flutter on coumadin, HTN, COPD w/ emphysema, CAD.  Type of Study: Modified Barium Swallowing Study Reason for Referral: Objectively evaluate swallowing function Previous Swallow Assessment: 12/24/11 BSE/MBS dys 3/thin  BSE 01/24/2013  NPO  Diet Prior to this Study: NPO Temperature  Spikes Noted: No Respiratory Status: Nasal cannula History of Recent Intubation: No Behavior/Cognition: Alert;Cooperative;Pleasant mood;Requires cueing Oral Cavity - Dentition: Poor condition;Missing dentition Oral Motor / Sensory Function: Within functional limits Self-Feeding Abilities: Able to feed self;Needs assist Patient Positioning: Upright  in chair Baseline Vocal Quality: Hoarse;Low vocal intensity Volitional Cough: Wet;Congested Volitional Swallow: Able to elicit Anatomy: Within functional limits Pharyngeal Secretions: Not observed secondary MBS    Reason for Referral Objectively evaluate swallowing function   Oral Phase Oral Preparation/Oral Phase Oral Phase: Impaired Oral - Nectar Oral - Nectar Cup: Within functional limits Oral - Thin Oral - Thin Cup: Within functional limits Oral - Thin Straw: Within functional limits Oral - Solids Oral - Puree: Within functional limits Oral - Regular: Other (Comment) (prolonged mastication, only a few teeth in front)   Pharyngeal Phase Pharyngeal Phase Pharyngeal Phase: Impaired Pharyngeal - Nectar Pharyngeal - Nectar Cup: Reduced airway/laryngeal closure;Penetration/Aspiration during swallow Penetration/Aspiration details (nectar cup): Material enters airway, remains ABOVE vocal cords then ejected out Pharyngeal - Thin Pharyngeal - Thin Cup: Reduced airway/laryngeal closure;Penetration/Aspiration during swallow Penetration/Aspiration details (thin cup): Material enters airway, remains ABOVE vocal cords then ejected out Pharyngeal - Thin Straw: Reduced airway/laryngeal closure;Penetration/Aspiration during swallow Penetration/Aspiration details (thin straw): Material enters airway, remains ABOVE vocal cords and not ejected out;Material enters airway, remains ABOVE vocal cords then ejected out;Material does not enter airway Pharyngeal - Solids Pharyngeal - Puree: Within functional limits Pharyngeal - Mechanical Soft: Within functional limits Pharyngeal - Regular: Within functional limits  Cervical Esophageal Phase    GO    Cervical Esophageal Phase Cervical Esophageal Phase: [facility]        Herbie [place], MA CCC-SLP [phone]  [place], Katherene Ponto 07/20/2013, 2:30 PM

## 2013-07-20 NOTE — Progress Notes (Signed)
CALL PAGER [phone] for any questions or notifications regarding this patient   FMTS Attending Daily Note: Dorcas Mcmurray MD  Attending pager:[phone]  office [phone]  I  have seen and examined this patient, reviewed their chart. I have discussed this patient with the resident. I agree with the resident's findings, assessment and care plan. Appreciate CCM eval. He continues in A flutter with 3:1/2:1. We had hoped that as his lung issues improved he would become more rate controlled. I think this is part of reason his BP is remaining on low side. He is not dizzy or having chest pain from these cardiac issues. He is symptomatically improved from pulm status. He has pretty severe underlying pulm disease (consistemnt with pulmonary fibrosis although I could not find that clearly delineated in his PMH, it is referred to as likely). We retstarted his psychiatric meds---he came inon quite a few. That has eased his agitation. Also on CIWA. We are trying to get hold of a family member today to see if we can get a better idea why he is on so many psych meds.

## 2013-07-20 NOTE — Progress Notes (Signed)
PULMONARY / CRITICAL CARE MEDICINE   Name: David [name] MRN: [id number] DOB: 05/12/1940    ADMISSION DATE:  07/17/2013 CONSULTATION DATE:  07/18/13  REFERRING MD :  Vance Gather, MD PRIMARY SERVICE: Family Medicine  CHIEF COMPLAINT:  COPD exacerbation  BRIEF PATIENT DESCRIPTION:  73 years old male with PMH relevant for CHF with LVEF 40 to 45%, atrial flutter on coumadin, HTN, COPD, CAD and heavy alcohol use with last drink a few days ago. Presented with progressive SOB and wheezing. His chest X ray at admission is clear. PCCM consult called to assist with management of hypoxemia and hypercarbia.   SIGNIFICANT EVENTS / STUDIES:  Chest X ray: 1. Stable cardiomegaly and chronic interstitial changes without  acute or superimposed abnormality.   LINES / TUBES: - Peripheral IV's  CULTURES:   ANTIBIOTICS: Levaquin / Clindamycin  SUBJECTIVE: No events overnight, continues to complain of SOB  VITAL SIGNS: Temp:  [97.4 F (36.3 C)-98 F (36.7 C)] 97.4 F (36.3 C) (06/30 0756) Pulse Rate:  [48-124] 117 (06/30 0300) Resp:  [20-29] 26 (06/30 0300) BP: (96-111)/(64-83) 111/78 mmHg (06/30 0300) SpO2:  [92 %-100 %] 100 % (06/30 0724) Weight:  [144 lb 2.9 oz (65.4 kg)] 144 lb 2.9 oz (65.4 kg) (06/30 0500) HEMODYNAMICS:   VENTILATOR SETTINGS:   INTAKE / OUTPUT: Intake/Output     06/29 0701 - 06/30 0700 06/30 0701 - 07/01 0700   I.V. (mL/kg) 9 (0.1)    IV Piggyback 200    Total Intake(mL/kg) 209 (3.2)    Urine (mL/kg/hr) 625 (0.4) 225 (1.8)   Total Output 625 225   Net -416 -225        Urine Occurrence 2 x     PHYSICAL EXAMINATION: General: Male patient in no acute distress. Eyes: Anicteric sclerae. ENT: Oropharynx clear. Dry mucous membranes. No thrush Lymph: No cervical, supraclavicular, or axillary lymphadenopathy. Heart: Regular rhythm (on rapid A. Flutter) No murmurs, rubs, or gallops appreciated. No bruits, equal pulses. Lungs: End exp wheezing bilaterally. Abdomen:  Abdomen soft, non-tender and not distended, normoactive bowel sounds. No hepatosplenomegaly or masses. Musculoskeletal: No clubbing or synovitis. No LE edema.  Skin: No rashes or lesions Neuro: No focal neurologic deficits.  LABS:  CBC  Recent Labs Lab 07/18/13 0730 07/19/13 0317 07/20/13 0805  WBC 6.0 8.3 7.6  HGB 13.2 13.8 13.9  HCT 40.4 42.5 42.9  PLT 186 189 201   Coag's  Recent Labs Lab 07/18/13 0954 07/19/13 0317 07/20/13 0230  INR 1.88* 1.49 1.63*   BMET  Recent Labs Lab 07/18/13 1400 07/19/13 0317 07/20/13 0805  NA 140 139 142  K 4.6 5.4* 5.1  CL 99 100 103  CO2 25 27 26   BUN 20 25* 30*  CREATININE 0.75 0.84 0.88  GLUCOSE 168* 140* 146*   Electrolytes  Recent Labs Lab 07/18/13 1400 07/19/13 0317 07/20/13 0805  CALCIUM 9.3 9.4 9.3   Sepsis Markers  Recent Labs Lab 07/17/13 1557  LATICACIDVEN 1.64   ABG  Recent Labs Lab 07/18/13 1814  PHART 7.353  PCO2ART 51.9*  PO2ART 70.5*   Liver Enzymes  Recent Labs Lab 07/17/13 1549  AST 31  ALT 21  ALKPHOS 77  BILITOT 0.3  ALBUMIN 3.3*   Cardiac Enzymes  Recent Labs Lab 07/17/13 1549 07/18/13 0255 07/18/13 0954 07/18/13 1240  TROPONINI  --  <0.30 <0.30 <0.30  PROBNP 5636.0*  --   --   --    Glucose No results found for this basename: GLUCAP,  in  the last 168 hours  Imaging No results found.   CXR:  No parenchymal infiltrates.   ASSESSMENT / PLAN:  PULMONARY A: 1) Acute COPD exacerbation 2) Doubt real aspiration pneumonia with no fever, WBC and clear chest X ray. May not need clindamycin 3) Patient with hypercarbia but normal pH. Consider BiPAP if respiratory acidosis. He is hypoxemic but he may have chronic component of hypoxemia. His O2 sat goal should be around 94%. Now 98% on 2 l [place] P:   - Continue Levaquin - Recommend D/C clinda - Repeat CXR in AM - Continue Duonebs and albuterol - Switched to IV solumedrol (60 mg BID) since he failed his swallow study -  Goal O2 sat around 94% - D/C BiPAP as unable to protect airway (failed swallow evaluation).  CARDIOVASCULAR A:  1) Rapid atrial flutter, possibly related to alcohol withdrawal? P:  - Recommend keeping on the dry side given cardiac history.   NEUROLOGIC A:   1) History of alcohol abuse. Possibly withdrawing.  P:   - Agree with CIWA protocol  - Monitor for DTs.  Also recommend involvement of palliative care.  I have personally obtained a history, examined the patient, evaluated laboratory and imaging results, formulated the assessment and plan and placed orders.  Rush Farmer, M.D. [facility] Pulmonary/Critical Care Medicine. Pager: [phone]. After hours pager: [phone].  07/20/2013, 8:57 AM

## 2013-07-20 NOTE — Progress Notes (Signed)
Family Medicine Teaching Service Daily Progress Note Intern Pager: [phone]  Patient name: David [name] Medical record number: [id number] Date of birth: 09/23/1940 Age: 73 y.o. Gender: male  Primary Care Provider: PROVIDER NOT IN SYSTEM Consultants: None Code Status: DNR (confirmed with [facility] - faxed documentation)  Pt Overview and Major Events to Date:  6/27 - Admitted  Assessment and Plan: Milen Lengacher is a 73 y.o. male presenting with acute dyspnea. PMH is significant for chronic systolic CHF (EF 25-42%; Echo 08/2012), Atrial flutter on coumadin, HTN, COPD w/ emphysema, CAD.   # Acute COPD exacerbation, in setting of chronic ILD and chronic respiratory failure - On admission, suspected predominantly due to COPD with diffuse wheezing / coarse breath sounds, however elevated BNP 5636. Afebrile, Normal WBC count. Normal lactic acid. Chest ray unremarkable. s/p Lasix 40mg  IV x 1 dose (good UOP 1.6 L), unlikely acute CHF exacerbation, will follow-up repeat CXR, does not appear volume overloaded - Currently remains in SDU - On 4L [place] (POx >97%), resp status mildly improved, persistent diffuse rhonchi. Did not require BiPAP o/n - appears more alert and responsive today - Continue Duonebs Q4/Albuterol Q2PRN, Flovent - Continue Levaquin IV for COPD exac - Discontinue empiric Clindamycin IV for unlikely aspiration PNA, (afebrile, nml WBC, CXR neg) - Consulted PCCM - re: hypoxemia / hypercarbia / respiratory distress - greatly appreciate assistance     - Switched Prednisone to Solumedrol 60mg  BID     - BiPAP PRN if uncompensated resp acidosis - (will DC if fails MBS swallow, note passed bedside)     - Repeat CXR in AM (r/o PNA and eval for possible vasc congestion)     - Consulted Palliative Care - attempted to contact family today (unsuccessful), request Palliative to coordinate family meeting, discuss goals of care, consider transition to palliative for symptom control if continued no  clinical improvement    Atrial flutter  - Persistent tachycardia with atrial fib - Continue V Metoprolol 10mg  q 6 hr for rate control  - Consider adding Diltiazem gtt (concern with BP) - Unable to transition to PO - Continue Warfarin (per pharm)  - Consider consult Cardiology, if worsening  History of alcohol abuse - unlikely withdrawal - CIWA protocol  - Consider increasing Ativan dose  HTN  - BP's currently low, improving - Will continue to monitor closely   Hx of Hypothyroidism  - Not on synthroid currently  - TSH suppressed at 0.237; FT3 (2.7 nml) and FT4 (0.91, nml)  Psych - Unclear diagnosis. Patient appears to have baseline dementia. History of depression - Passed swallow study today (Dys-3 diet) with crushed meds / puree - Resume PO meds - Invega, Seroquel, Cymbalta - continue to Hold Remeron, Topamax to avoid oversedation - Discontinue Risperdone (M-tabs) and Haldol IV PRN now that taking PO  CAD, HLD  - Continue statin (lipitor substituted for crestor)   + Hemoccult  - Hemoglobin stable and WNL - Will continue to trend daily   Hyperglycemia  - Elevated on BMP  - A1C 7.0  FEN/GI: Dys-3, crushed meds with puree ; SL IV.  Prophylaxis: Coumadin Per Pharm   Disposition: Pending clinical improvement.  Subjective:  Improved today, persistent coarse breath sounds with 4L [place] in place, inc verbal response with stating "feeling better", still poor resp status and mumbled speech, disoriented. No acute events overnight, did not require BiPAP.  Objective: Temp:  [97.4 F (36.3 C)-98 F (36.7 C)] 97.4 F (36.3 C) (06/30 0756) Pulse Rate:  [  48-138] 114 (06/30 0900) Resp:  [20-32] 32 (06/30 0759) BP: (96-118)/(64-84) 100/78 mmHg (06/30 0900) SpO2:  [92 %-100 %] 97 % (06/30 0759) Weight:  [144 lb 2.9 oz (65.4 kg)] 144 lb 2.9 oz (65.4 kg) (06/30 0500)  Physical Exam: General: chronically ill appearing male, resting in bed, NAD.  Cardiovascular: Tachycardic,  Irregular. No murmur noted. Respiratory: Mildly improved, diffuse coarse breath sounds with rhonchi and scattered wheezes throughout all lung fields. No significant respiratory distress, mild tachypnea. Abdomen: soft, NTND, +active BS. Extremities: No LE edema.  Neuro: Alert, awake, Not oriented to place or time, improved alertness and verbal responses. Follows most commands, grossly non-focal  Laboratory:  Recent Labs Lab 07/18/13 0730 07/19/13 0317 07/20/13 0805  WBC 6.0 8.3 7.6  HGB 13.2 13.8 13.9  HCT 40.4 42.5 42.9  PLT 186 189 201    Recent Labs Lab 07/17/13 1549  07/18/13 1400 07/19/13 0317 07/20/13 0805  NA 151*  < > 140 139 142  K 5.1  < > 4.6 5.4* 5.1  CL 94*  < > 99 100 103  CO2 25  < > 25 27 26   BUN 18  < > 20 25* 30*  CREATININE 0.78  < > 0.75 0.84 0.88  CALCIUM 9.2  < > 9.3 9.4 9.3  PROT 7.4  --   --   --   --   BILITOT 0.3  --   --   --   --   ALKPHOS 77  --   --   --   --   ALT 21  --   --   --   --   AST 31  --   --   --   --   GLUCOSE 170*  < > 168* 140* 146*  < > = values in this interval not displayed. BNP (last 3 results)  Recent Labs  01/23/13 1653 05/05/13 2215 07/17/13 1549  PROBNP 1890.0* 1000.0* 5636.0*   Troponin - POC Negative   Lactic Acid - 1.64 (normal)  FOBT - Positive.   EKG - Atrial flutter (rate 114).   VBG: 7.296/55.1/138/26.9  ABG    Component Value Date/Time   PHART 7.353 07/18/2013 1814   PCO2ART 51.9* 07/18/2013 1814   PO2ART 70.5* 07/18/2013 1814   HCO3 28.1* 07/18/2013 1814   TCO2 29.7 07/18/2013 1814   ACIDBASEDEF 1.0 07/17/2013 1616   O2SAT 92.2 07/18/2013 1814    Imaging/Diagnostic Tests:  Dg Chest Portable 1 View  07/17/2013 IMPRESSION: 1. Stable cardiomegaly and chronic interstitial changes without acute or superimposed abnormality.  Nobie Putnam, DO 07/20/2013, 10:00 AM PGY-1, [place] Intern pager: [phone], text pages welcome

## 2013-07-21 ENCOUNTER — Inpatient Hospital Stay (HOSPITAL_COMMUNITY): Payer: PRIVATE HEALTH INSURANCE

## 2013-07-21 DIAGNOSIS — E43 Unspecified severe protein-calorie malnutrition: Secondary | ICD-10-CM

## 2013-07-21 DIAGNOSIS — I158 Other secondary hypertension: Secondary | ICD-10-CM

## 2013-07-21 LAB — BASIC METABOLIC PANEL
BUN: 28 mg/dL — ABNORMAL HIGH (ref 6–23)
CO2: 25 mEq/L (ref 19–32)
CREATININE: 0.85 mg/dL (ref 0.50–1.35)
Calcium: 9 mg/dL (ref 8.4–10.5)
Chloride: 98 mEq/L (ref 96–112)
GFR calc non Af Amer: 84 mL/min — ABNORMAL LOW (ref >90)
Glucose, Bld: 153 mg/dL — ABNORMAL HIGH (ref 70–99)
POTASSIUM: 5.3 meq/L (ref 3.7–5.3)
Sodium: 135 mEq/L — ABNORMAL LOW (ref 137–147)

## 2013-07-21 LAB — CBC
HEMATOCRIT: 40.1 % (ref 39.0–52.0)
Hemoglobin: 13.1 g/dL (ref 13.0–17.0)
MCH: 29.6 pg (ref 26.0–34.0)
MCHC: 32.7 g/dL (ref 30.0–36.0)
MCV: 90.7 fL (ref 78.0–100.0)
PLATELETS: 210 10*3/uL (ref 150–400)
RBC: 4.42 MIL/uL (ref 4.22–5.81)
RDW: 15 % (ref 11.5–15.5)
WBC: 6.3 10*3/uL (ref 4.0–10.5)

## 2013-07-21 LAB — TROPONIN I: Troponin I: <0.3 ng/mL (ref <0.30)

## 2013-07-21 LAB — PROTIME-INR
INR: 1.51 — ABNORMAL HIGH (ref 0.00–1.49)
Prothrombin Time: 18.2 seconds — ABNORMAL HIGH (ref 11.6–15.2)

## 2013-07-21 MED ORDER — WARFARIN SODIUM 7.5 MG PO TABS
7.5000 mg | ORAL_TABLET | Freq: Once | ORAL | Status: AC
  Administered 2013-07-21: 7.5 mg via ORAL
  Filled 2013-07-21: qty 1

## 2013-07-21 MED ORDER — LEVOFLOXACIN 750 MG PO TABS
750.0000 mg | ORAL_TABLET | ORAL | Status: DC
  Administered 2013-07-21 – 2013-07-22 (×2): 750 mg via ORAL
  Filled 2013-07-21 (×2): qty 1

## 2013-07-21 MED ORDER — METOPROLOL TARTRATE 25 MG PO TABS: 25.0000 mg | ORAL_TABLET | Freq: Two times a day (BID) | ORAL | Status: DC

## 2013-07-21 MED ORDER — METOPROLOL TARTRATE 1 MG/ML IV SOLN: 10.0000 mg | Freq: Four times a day (QID) | INTRAVENOUS | Status: DC | PRN

## 2013-07-21 MED ORDER — METOPROLOL TARTRATE 1 MG/ML IV SOLN
10.0000 mg | Freq: Four times a day (QID) | INTRAVENOUS | Status: DC | PRN
  Administered 2013-07-21: 10 mg via INTRAVENOUS
  Filled 2013-07-21: qty 10

## 2013-07-21 MED ORDER — METOPROLOL TARTRATE 50 MG PO TABS
50.0000 mg | ORAL_TABLET | Freq: Two times a day (BID) | ORAL | Status: DC
  Administered 2013-07-21 – 2013-07-22 (×3): 50 mg via ORAL
  Filled 2013-07-21 (×4): qty 1

## 2013-07-21 MED ORDER — WARFARIN - PHARMACIST DOSING INPATIENT
Freq: Every day | Status: DC
  Administered 2013-07-21: 18:00:00

## 2013-07-21 MED ORDER — PREDNISONE 50 MG PO TABS
50.0000 mg | ORAL_TABLET | Freq: Every day | ORAL | Status: DC
  Administered 2013-07-21 – 2013-07-22 (×2): 50 mg via ORAL
  Filled 2013-07-21 (×3): qty 1

## 2013-07-21 MED ORDER — WARFARIN SODIUM 5 MG PO TABS: 5.0000 mg | ORAL_TABLET | Freq: Every day | ORAL | Status: DC

## 2013-07-21 NOTE — Progress Notes (Signed)
PULMONARY / CRITICAL CARE MEDICINE   Name: David [name] MRN: [id number] DOB: [birth year]    ADMISSION DATE:  07/17/2013 CONSULTATION DATE:  07/18/13  REFERRING MD :  Vance Gather, MD PRIMARY SERVICE: Family Medicine  CHIEF COMPLAINT:  COPD exacerbation  BRIEF PATIENT DESCRIPTION:  73 years old male with PMH relevant for CHF with LVEF 40 to 45%, atrial flutter on coumadin, HTN, COPD, CAD and heavy alcohol use with last drink a few days ago. Presented with progressive SOB and wheezing. His chest X ray at admission is clear. PCCM consult called to assist with management of hypoxemia and hypercarbia.   SIGNIFICANT EVENTS / STUDIES:  Chest X ray: 1. Stable cardiomegaly and chronic interstitial changes without  acute or superimposed abnormality.   LINES / TUBES: - Peripheral IV's  CULTURES:   ANTIBIOTICS: Levaquin / Clindamycin  SUBJECTIVE: Continues to refuse BiPAP.  On RA this AM with sats  In the high 90's.  VITAL SIGNS: Temp:  [97.3 F (36.3 C)-98.1 F (36.7 C)] 97.5 F (36.4 C) (07/01 0737) Pulse Rate:  [34-138] 138 (07/01 1013) Resp:  [16-28] 20 (07/01 0737) BP: (93-113)/(58-86) 104/73 mmHg (07/01 1013) SpO2:  [92 %-99 %] 99 % (07/01 0750) Weight:  [148 lb 13 oz (67.5 kg)] 148 lb 13 oz (67.5 kg) (07/01 0500) HEMODYNAMICS:   VENTILATOR SETTINGS:   INTAKE / OUTPUT: Intake/Output     06/30 0701 - 07/01 0700 07/01 0701 - 07/02 0700   P.O. 360    I.V. (mL/kg) 3 (0) 3 (0)   IV Piggyback 150    Total Intake(mL/kg) 513 (7.6) 3 (0)   Urine (mL/kg/hr) 800 (0.5) 175 (0.5)   Total Output 800 175   Net -287 -172         PHYSICAL EXAMINATION: General: Male patient in no acute distress. Eyes: Anicteric sclerae. ENT: Oropharynx clear. Dry mucous membranes. No thrush Lymph: No cervical, supraclavicular, or axillary lymphadenopathy. Heart: Regular rhythm (on rapid A. Flutter) No murmurs, rubs, or gallops appreciated. No bruits, equal pulses. Lungs: End exp wheezing  bilaterally. Abdomen: Abdomen soft, non-tender and not distended, normoactive bowel sounds. No hepatosplenomegaly or masses. Musculoskeletal: No clubbing or synovitis. No LE edema.  Skin: No rashes or lesions Neuro: No focal neurologic deficits.  LABS:  CBC  Recent Labs Lab 07/19/13 0317 07/20/13 0805 07/21/13 0335  WBC 8.3 7.6 6.3  HGB 13.8 13.9 13.1  HCT 42.5 42.9 40.1  PLT 189 201 210   Coag's  Recent Labs Lab 07/19/13 0317 07/20/13 0230 07/21/13 0335  INR 1.49 1.63* 1.51*   BMET  Recent Labs Lab 07/19/13 0317 07/20/13 0805 07/21/13 0335  NA 139 142 135*  K 5.4* 5.1 5.3  CL 100 103 98  CO2 27 26 25   BUN 25* 30* 28*  CREATININE 0.84 0.88 0.85  GLUCOSE 140* 146* 153*   Electrolytes  Recent Labs Lab 07/19/13 0317 07/20/13 0805 07/21/13 0335  CALCIUM 9.4 9.3 9.0   Sepsis Markers  Recent Labs Lab 07/17/13 1557  LATICACIDVEN 1.64   ABG  Recent Labs Lab 07/18/13 1814  PHART 7.353  PCO2ART 51.9*  PO2ART 70.5*   Liver Enzymes  Recent Labs Lab 07/17/13 1549  AST 31  ALT 21  ALKPHOS 77  BILITOT 0.3  ALBUMIN 3.3*   Cardiac Enzymes  Recent Labs Lab 07/17/13 1549 07/18/13 0255 07/18/13 0954 07/18/13 1240  TROPONINI  --  <0.30 <0.30 <0.30  PROBNP 5636.0*  --   --   --    Glucose No  results found for this basename: GLUCAP,  in the last 168 hours  Imaging Dg Chest Port 1 View  07/21/2013   CLINICAL DATA:  Difficulty breathing  EXAM: PORTABLE CHEST - 1 VIEW  COMPARISON:  July 17, 2013  FINDINGS: There is no edema or consolidation. Heart is borderline enlarged with normal pulmonary vascularity. No adenopathy. No pneumothorax. No bone lesions.  IMPRESSION: No edema or consolidation.  Heart borderline enlarged but stable.   Electronically Signed   By: Lowella Grip M.D.   On: 07/21/2013 06:58     CXR:  No parenchymal infiltrates.   ASSESSMENT / PLAN:  PULMONARY A: 1) Acute COPD exacerbation 2) Doubt real aspiration pneumonia  with no fever, WBC and clear chest X ray. May not need clindamycin 3) Patient with hypercarbia but normal pH. Consider BiPAP if respiratory acidosis. He is hypoxemic but he may have chronic component of hypoxemia. His O2 sat goal should be around 94%. Now 98% on RA. P:   - Continue Levaquin, would finish an 8 day course and would change to PO (similar bio-availability). - Repeat CXR noted, no congestion. - Continue Duonebs and albuterol - Recommend switching steroids to PO. - Goal O2 sat around 94% - Remove BiPAP from room.  CARDIOVASCULAR A:  1) Rapid atrial flutter, possibly related to alcohol withdrawal? P:  - Recommend keeping on the dry side given cardiac history.   NEUROLOGIC A:   1) History of alcohol abuse. Possibly withdrawing.  P:   - Agree with CIWA protocol  - Monitor for DTs.  Awaiting palliative care intervention.  PCCM will sign off, please call back if needed.  I have personally obtained a history, examined the patient, evaluated laboratory and imaging results, formulated the assessment and plan and placed orders.  Rush Farmer, M.D. [facility] Pulmonary/Critical Care Medicine. Pager: [phone]. After hours pager: [phone].  07/21/2013, 12:00 PM

## 2013-07-21 NOTE — Consult Note (Signed)
CARDIOLOGY CONSULT NOTE   Patient ID: David [name] MRN: [id number] DOB/AGE: November 03, 1940 73 y.o.  Admit date: 07/17/2013  Primary Physician   PROVIDER NOT IN SYSTEM Primary Cardiologist   Dr. Martinique  Reason for Consultation   Atrial flutter - rate control   HPI: David [name] is a 73 y.o. male with a history of CAD s/p LHC in 2008 RCA 95%, RI 60% - rx therapy, COPD, ischemic cardiomyopathy (EF 40-45% ECHO 2014), HTN, hypothyroidism, chronic atrial flutter on coumadin, and heavy alcohol use/tobacco abuse who was brought in from the nursing home to [facility] on 07/17/13 for progressive SOB and wheezing. He was found to have an acute COPD exacerbation in the setting of chronic ILD and chronic respiratory failure. He is chronically in atrial flutter, but developed RVR on 07/20/13 and cardiology has been consulted for help in rate control.  He is followed by Dr. Martinique in the clinic. He has an ischemic CM and Echo 09/20/12 revealed EF 40 to 45% and mild MR. This is improved from previous ECHO in 12/2011 with EF 20-25%. He has not tolerated ACE/ARB in the past due to hypotension. He also has atrial flutter managed with metoprolol and coumadin. He had a cardioversion in 2008 but has since had recurrence. He has a hx of severe bradycardia in the setting of digoxin therapy. The patient presented in respiratory distress due to COPD exacerbation, he was seen by PCCM for help with management of hypoxemia and hypercarbia. His respiratory status is much improved after ABX, duonebs, albuterol and steroids. He is off BIPAP and 02 currently. There has been worry that his atrial flutter with RVR may be due partly to alcohol withdrawal and he is currently on ativan and CIWA protocol.   The patient is a poor historian and difficult to understand. He mutters things that are unintelligible to me. He denies CP but does have some SOB. He denies lightheadedness, orthopnea, PND, LE swelling, n/v or diarrhea.     Past  Medical History  Diagnosis Date  . Community acquired pneumonia     Right middle lobe community-acquired pneumonia  . Toxic metabolic encephalopathy   . Chronic atrial flutter     a. rate controlled with bb therapy;  b. h/o bradycardia when on digoxin 12/2011.  . Tobacco abuse   . History of coronary artery disease 2008    a. Cath at [facility]: RCA 95%, RI 60%  - medical therapy  . Ischemic cardiomyopathy     a. prev documented EF of 20-30%;  b. 09/2012 Echo: EF 40-45%, diff HK, mild MR, mild bi-atrial enlargement.  . History of prostate cancer   . Chronic obstructive pulmonary disease     with mild bronchospasm, improved  . Hypertension   . History of alcohol abuse     History of prior heavy alcohol abuse  . History of depression   . Altered mental status 2012   . Noncompliance   . Depression   . Ulcer   . Hx of migraines   . Hyperlipidemia   . Colon cancer      Past Surgical History  Procedure Laterality Date  . Appendectomy      Allergies  Allergen Reactions  . Ace Inhibitors   . Oat Flour [Oat]     Unknown per MAR  . Other     Greens: unknown reaction per [facility]    I have reviewed the patient's current medications . antiseptic oral rinse  15 mL Mouth  Rinse q12n4p  . atorvastatin  40 mg Oral q1800  . chlorhexidine  15 mL Mouth Rinse BID  . DULoxetine  60 mg Oral Daily  . fluticasone  2 puff Inhalation BID  . folic acid  1 mg Oral Daily  . ipratropium-albuterol  3 mL Nebulization TID  . levofloxacin  750 mg Oral Q24H  . metoprolol tartrate  50 mg Oral BID  . mirtazapine  15 mg Oral QHS  . multivitamin with minerals  1 tablet Oral Daily  . paliperidone  6 mg Oral Daily  . pantoprazole (PROTONIX) IV  40 mg Intravenous Q24H  . predniSONE  50 mg Oral Q breakfast  . sodium chloride  3 mL Intravenous Q12H  . thiamine  100 mg Oral Daily   Or  . thiamine  100 mg Intravenous Daily  . topiramate  25 mg Oral QHS  . warfarin  7.5 mg Oral ONCE-1800  . Warfarin -  Pharmacist Dosing Inpatient   Does not apply q1800   . albuterol Stopped (07/17/13 1916)  . albuterol     sodium chloride, acetaminophen, acetaminophen, albuterol, LORazepam, LORazepam, metoprolol, sodium chloride  Prior to Admission medications   Medication Sig Start Date End Date Taking? Authorizing Provider  albuterol (PROAIR HFA) 108 (90 BASE) MCG/ACT inhaler Inhale 2 puffs into the lungs every 6 (six) hours as needed for wheezing or shortness of breath.   Yes Historical Provider, MD  beclomethasone (QVAR) 40 MCG/ACT inhaler Inhale 2 puffs into the lungs 2 (two) times daily.   Yes Historical Provider, MD  DULoxetine (CYMBALTA) 60 MG capsule Take 60 mg by mouth daily.   Yes Historical Provider, MD  folic acid (FOLVITE) 1 MG tablet Take 1 tablet (1 mg total) by mouth daily. 12/31/11  Yes Marianne L York, PA-C  guaiFENesin (MUCINEX) 600 MG 12 hr tablet Take 1 tablet (600 mg total) by mouth 2 (two) times daily. 12/31/11  Yes Marianne L York, PA-C  lactulose (CHRONULAC) 10 GM/15ML solution Take 20 g by mouth daily.   Yes Historical Provider, MD  metoprolol tartrate (LOPRESSOR) 25 MG tablet Take 12.5 mg by mouth 2 (two) times daily.   Yes Historical Provider, MD  mirtazapine (REMERON) 15 MG tablet Take 15 mg by mouth at bedtime.   Yes Historical Provider, MD  Multiple Vitamin (MULTIVITAMIN WITH MINERALS) TABS tablet Take 1 tablet by mouth daily.   Yes Historical Provider, MD  paliperidone (INVEGA) 6 MG 24 hr tablet Take 6 mg by mouth daily.    Yes Historical Provider, MD  QUEtiapine (SEROQUEL) 50 MG tablet Take 50 mg by mouth 2 (two) times daily.   Yes Historical Provider, MD  rosuvastatin (CRESTOR) 20 MG tablet Take 20 mg by mouth daily.   Yes Historical Provider, MD  thiamine (VITAMIN B-1) 100 MG tablet Take 100 mg by mouth daily.   Yes Historical Provider, MD  tiotropium (SPIRIVA) 18 MCG inhalation capsule Place 18 mcg into inhaler and inhale daily. 11/06/12  Yes Chesley Mires, MD  topiramate  (TOPAMAX) 25 MG tablet Take 25 mg by mouth at bedtime.    Yes Historical Provider, MD  warfarin (COUMADIN) 5 MG tablet Take 5 mg by mouth every evening. 5pm   Yes Historical Provider, MD     History   Social History  . Marital Status: Legally Separated    Spouse Name: N/A    Number of Children: N/A  . Years of Education: 10   Occupational History  .     Social  History Main Topics  . Smoking status: Current Every Day Smoker -- 0.50 packs/day for 40 years    Types: Cigarettes  . Smokeless tobacco: Never Used  . Alcohol Use: No  . Drug Use: No  . Sexual Activity: No   Other Topics Concern  . Not on file   Social History Narrative   Regular exercise-no..   Caffeine Use-yes    No family status information on file.   Family History  Problem Relation Age of Onset  . Cancer Father     Prostate Cancer  . Heart disease Other     Parent  . Alcohol abuse Other     Parent  . Arthritis Other     parent  . Hypertension Other     Parent     ROS:  Full 14 point review of systems complete and found to be negative unless listed above.  Physical Exam: Blood pressure 95/69, pulse 34, temperature 97.4 F (36.3 C), temperature source Oral, resp. rate 22, height 6' (1.829 m), weight 148 lb 13 oz (67.5 kg), SpO2 100.00%.  General: Male patient in no acute distress.  Eyes: Anicteric sclerae.  ENT: Oropharynx clear. Dry mucous membranes. No thrush  Lymph: No cervical, supraclavicular, or axillary lymphadenopathy.  Heart: Regular rhythm (on rapid A. Flutter) No murmurs, rubs, or gallops appreciated. No bruits, equal pulses.  Lungs: End exp wheezing bilaterally.  Abdomen: Abdomen soft, non-tender and not distended, normoactive bowel sounds. No hepatosplenomegaly or masses.  Musculoskeletal: No clubbing or synovitis. No LE edema.  Skin: No rashes or lesions  Neuro: No focal neurologic deficits.   Labs:   Lab Results  Component Value Date   WBC 6.3 07/21/2013   HGB 13.1 07/21/2013    HCT 40.1 07/21/2013   MCV 90.7 07/21/2013   PLT 210 07/21/2013    Recent Labs  07/21/13 0335  INR 1.51*    Recent Labs Lab 07/17/13 1549  07/21/13 0335  NA 151*  < > 135*  K 5.1  < > 5.3  CL 94*  < > 98  CO2 25  < > 25  BUN 18  < > 28*  CREATININE 0.78  < > 0.85  CALCIUM 9.2  < > 9.0  PROT 7.4  --   --   BILITOT 0.3  --   --   ALKPHOS 77  --   --   ALT 21  --   --   AST 31  --   --   GLUCOSE 170*  < > 153*  ALBUMIN 3.3*  --   --   < > = values in this interval not displayed. Magnesium  Date Value Ref Range Status  01/24/2013 1.9  1.5 - 2.5 mg/dL Final    Pro B Natriuretic peptide (BNP)  Date/Time Value Ref Range Status  07/17/2013  3:49 PM 5636.0* 0 - 125 pg/mL Final  05/05/2013 10:15 PM 1000.0* 0 - 125 pg/mL Final    Lipase  Date/Time Value Ref Range Status  01/23/2013 11:00 AM 9* 11 - 59 U/L Final   TSH  Date/Time Value Ref Range Status  07/17/2013 11:00 PM 0.237* 0.350 - 4.500 uIU/mL Final     T3, Free  Date/Time Value Ref Range Status  07/18/2013  9:54 AM 2.7  2.3 - 4.2 pg/mL Final     Performed at [facility]    Echo: 09/20/13: Study Conclusions - Left ventricle: The cavity size was mildly dilated. Wall thickness was normal. Systolic function was  mildly to moderately reduced. The estimated ejection fraction was in the range of 40% to 45%. Diffuse hypokinesis. - Mitral valve: Mild regurgitation. - Left atrium: The atrium was mildly dilated. - Right atrium: The atrium was mildly dilated.   ECG:  Atrial flutter with RVR. HR 137. Non specific ICVD  Radiology:  Dg Chest Port 1 View  07/21/2013   CLINICAL DATA:  Difficulty breathing  EXAM: PORTABLE CHEST - 1 VIEW  COMPARISON:  July 17, 2013  FINDINGS: There is no edema or consolidation. Heart is borderline enlarged with normal pulmonary vascularity. No adenopathy. No pneumothorax. No bone lesions.  IMPRESSION: No edema or consolidation.  Heart borderline enlarged but stable.   Electronically Signed   By:  Lowella Grip M.D.   On: 07/21/2013 06:58    ASSESSMENT AND PLAN:    Active Problems:   Atrial flutter   CAD (coronary artery disease)   Ischemic cardiomyopathy   Tobacco abuse   HTN (hypertension)   Hypothyroidism   Chronic systolic congestive heart failure, NYHA class 3   COPD with emphysema   Warfarin anticoagulation   Protein-calorie malnutrition, severe   Acute exacerbation of chronic obstructive pulmonary disease (COPD)  Djuan Talton is a 73 y.o. male with a history of CAD s/p LHC RCA 95%, RI 60% - rx therapy, COPD, ischemic cardiomyopathy (EF 40-45% ECHO 2014), HTN, hypothyroidism, chronic atrial flutter on coumadin, and heavy alcohol use/tobacco abuse who was brought in from the nursing home to [facility] on 07/17/13 for progressive SOB and wheezing. He was found to have an acute COPD exacerbation in the setting of chronic ILD and chronic respiratory failure. He is chronically in atrial flutter, but developed RVR on 07/20/13 and cardiology has been consulted for help in rate control.   Chronic atrial flutter. He had cardioversion in 2008 but has since had recurrence.  -- 09/2012 hospitalization he had RVR. He was initially digitalized but this was later stopped because of history of severe bradycardia on this medication.  -- Controlled as an outpatient on metoprolol 25mg  mg BID only. This was increased to 50mg  BID inpatient for RVR.  -- Continue coumadin. Currently subtheraputic. Per pharmacy.  -- Currently HR 100-120 bmp on metop 50mg  BID and IV lopressor x1. Would avoid dilt in the setting of LV dysfunction and amiodarone with lung disease. Would just continue to monitor at this point with patient poor functional status.   Chronic systolic CHF/ischemic CM- Thought to be euvolemic. ProBNP of 5000 plus. Chest xray clear -- Echo 09/20/12 EF 40 to 45%, mild MR. This is improved from previous ECHO in 12/2011 with EF 20-25%  -- Historically blood pressure too low to consider ACE inhibitor  or ARB.  -- Continue BB.   Acute COPD exacerbation  -- Patient with hypercarbia but normal pH. Per PCCM: Consider BiPAP if respiratory acidosis. He is hypoxemic but he may have chronic component of hypoxemia. His O2 sat goal should be around 94%. Now 98% on RA.  -- Continue ABx -- Repeat CXR noted, no congestion.  -- Continue Duonebs and albuterol, PO steroids   History of alcohol abuse. Possibly withdrawing.  -- Agree with CIWA protocol  -- Monitor for DTs.   CAD- Cath at [facility]: RCA 95%, RI 60%  - medical therapy -- No objective signs of ischemia. No chest pain. -- Continue statin, BB. Not on ASA due to coumadin use.   HTN  -- BP's currently low, improving  -- Will continue to monitor closely   +  FOBT- H/H stable. Continue to monitor closely in the setting of coumadin therapy.   Dispo- Consulted Palliative Care    Signed: Ranae Plumber 07/21/2013 2:21 PM  Pager [phone]  Co-Sign MD  I have examined the patient and reviewed assessment and plan and discussed with patient.  Agree with above as stated.  Increase metoprolol.  Aim for HR < 110.  He does not appear very active.  Limited by some confusion.  Agree with palliative care.  Derhonda Eastlick S.

## 2013-07-21 NOTE — Progress Notes (Signed)
Obtained an ekg per md order and called md with results. Will continue to monitor.

## 2013-07-21 NOTE — Progress Notes (Signed)
Patients HR 138 in aflutter. bp 110/72, pt is asymptomatic. Notified MD. No orders received. Will continue to monitor. Irish Elders

## 2013-07-21 NOTE — Clinical Social Work Note (Signed)
CSW has made several attempts to reach "[place]" listed in contacts for patient without success. Several messages have been left but CSW has not received call back.   Liz Beach MSW, [place], [place], [phone]

## 2013-07-21 NOTE — Progress Notes (Signed)
Family Medicine Teaching Service Daily Progress Note Intern Pager: [phone]  Patient name: David [name] Medical record number: [id number] Date of birth: 1940-04-29 Age: 73 y.o. Gender: male  Primary Care Provider: PROVIDER NOT IN SYSTEM Consultants: None Code Status: DNR (confirmed with [facility] - faxed documentation)  Pt Overview and Major Events to Date:  6/27 - Admitted  Assessment and Plan: David [name] is a 73 y.o. male presenting with acute dyspnea. PMH is significant for chronic systolic CHF (EF 54-56%; Echo 08/2012), Atrial flutter on coumadin, HTN, COPD w/ emphysema, CAD.   # Acute COPD exacerbation, in setting of chronic ILD and chronic respiratory failure - On admission, suspected predominantly due to COPD with diffuse wheezing / coarse breath sounds, however elevated BNP 5636. Afebrile, Normal WBC count. Normal lactic acid. Chest ray unremarkable. s/p Lasix 40mg  IV x 1 dose (good UOP 1.6 L), unlikely acute CHF exacerbation, will follow-up repeat CXR, does not appear volume overloaded - Currently remains in SDU - On RA (POx >97%), resp status improved, persistent diffuse rhonchi, likely 2/2 chronic interstitial lung disease. Did not require BiPAP o/n - very somnolent this AM - Continue Duonebs Q4/Albuterol Q2PRN, Flovent - Continue Levaquin IV for COPD exac - Consulted PCCM - re: hypoxemia / hypercarbia / respiratory distress - greatly appreciate assistance - Switched Solumedrol to Prednisone 50mg  qday, as now tolerating PO - Consulted Palliative Care - attempted to contact family yesterday (unsuccessful), request Palliative to coordinate family meeting, discuss goals of care, consider transition to palliative for symptom control if continued no clinical improvement - Repeat CXR (7/1) shows no edema or consolidation.    Atrial flutter  - Persistent tachycardia in 130s with atrial flutter - Switched to Metoprolol PO 50mg  BID for rate control - Consider adding Diltiazem  gtt (concern with BP, though better O/N) - Continue Warfarin (per pharm), discontinued lovenox - Consult Cardiology today for help with rate control.  History of alcohol abuse - unlikely withdrawal - CIWA protocol  - Consider increasing Ativan dose  HTN  - BP's currently low, improving - Will continue to monitor closely   Hx of Hypothyroidism  - Not on synthroid currently  - TSH suppressed at 0.237; FT3 (2.7 nml) and FT4 (0.91, nml)  Psych - Unclear diagnosis. Patient appears to have baseline dementia. History of depression - Passed swallow study today (Dys-3 diet) with crushed meds / puree - Continue PO meds - Invega, Seroquel, Cymbalta - continue to Hold Remeron, Topamax to avoid oversedation  CAD, HLD  - Continue statin (lipitor substituted for crestor)   + Hemoccult  - Hemoglobin stable and WNL - Will continue to trend daily   Hyperglycemia  - Elevated on BMP  - A1C 7.0  FEN/GI: Dys-3, crushed meds with puree ; SL IV.  Prophylaxis: Coumadin Per Pharm   Disposition: Pending clinical improvement.  Subjective:  Improved respiratory status today, persistent coarse breath sounds, on RA, very somnolent, very little verbal response, disoriented. No acute events overnight, did not require BiPAP.  Objective: Temp:  [97.3 F (36.3 C)-98.1 F (36.7 C)] 97.5 F (36.4 C) (07/01 0737) Pulse Rate:  [34-137] 136 (07/01 0737) Resp:  [16-28] 20 (07/01 0737) BP: (93-113)/(58-86) 110/72 mmHg (07/01 0737) SpO2:  [92 %-99 %] 99 % (07/01 0750) Weight:  [148 lb 13 oz (67.5 kg)] 148 lb 13 oz (67.5 kg) (07/01 0500)  Physical Exam: General: chronically ill appearing male, resting in bed, NAD.  Cardiovascular: Tachycardic, Irregular. No murmur noted. Respiratory: Improved WOB, diffuse coarse  breath sounds with rhonchi and scattered wheezes throughout all lung fields. No respiratory distress. Abdomen: soft, NTND, +active BS. Extremities: No LE edema.  Neuro: somnolent, Not oriented to  place or time, improved alertness and verbal responses. Follows most commands, grossly non-focal  Laboratory:  Recent Labs Lab 07/19/13 0317 07/20/13 0805 07/21/13 0335  WBC 8.3 7.6 6.3  HGB 13.8 13.9 13.1  HCT 42.5 42.9 40.1  PLT 189 201 210    Recent Labs Lab 07/17/13 1549  07/19/13 0317 07/20/13 0805 07/21/13 0335  NA 151*  < > 139 142 135*  K 5.1  < > 5.4* 5.1 5.3  CL 94*  < > 100 103 98  CO2 25  < > 27 26 25   BUN 18  < > 25* 30* 28*  CREATININE 0.78  < > 0.84 0.88 0.85  CALCIUM 9.2  < > 9.4 9.3 9.0  PROT 7.4  --   --   --   --   BILITOT 0.3  --   --   --   --   ALKPHOS 77  --   --   --   --   ALT 21  --   --   --   --   AST 31  --   --   --   --   GLUCOSE 170*  < > 140* 146* 153*  < > = values in this interval not displayed. BNP (last 3 results)  Recent Labs  01/23/13 1653 05/05/13 2215 07/17/13 1549  PROBNP 1890.0* 1000.0* 5636.0*   Troponin - POC Negative   Lactic Acid - 1.64 (normal)  FOBT - Positive.   EKG - Atrial flutter (rate 114).   VBG: 7.296/55.1/138/26.9  ABG    Component Value Date/Time   PHART 7.353 07/18/2013 1814   PCO2ART 51.9* 07/18/2013 1814   PO2ART 70.5* 07/18/2013 1814   HCO3 28.1* 07/18/2013 1814   TCO2 29.7 07/18/2013 1814   ACIDBASEDEF 1.0 07/17/2013 1616   O2SAT 92.2 07/18/2013 1814    Imaging/Diagnostic Tests:  Dg Chest Portable 1 View  07/17/2013 IMPRESSION: 1. Stable cardiomegaly and chronic interstitial changes without acute or superimposed abnormality.   [name], MD 07/21/2013, 8:36 AM PGY-1, [place] Intern pager: [phone], text pages welcome

## 2013-07-21 NOTE — Progress Notes (Addendum)
ANTICOAGULATION CONSULT NOTE - Follow Up Consult  Pharmacy Consult for coumadin Indication: atrial fibrillation  Allergies  Allergen Reactions  . Ace Inhibitors   . Oat Flour [Oat]     Unknown per MAR  . Other     Greens: unknown reaction per [facility]    Patient Measurements: Height: 6' (182.9 cm) Weight: 148 lb 13 oz (67.5 kg) IBW/kg (Calculated) : 77.6  Vital Signs: Temp: 97.5 F (36.4 C) (07/01 0737) Temp src: Oral (07/01 0737) BP: 110/72 mmHg (07/01 0737) Pulse Rate: 136 (07/01 0737)  Labs:  Recent Labs  07/18/13 0954 07/18/13 1240  07/19/13 0317 07/20/13 0230 07/20/13 0805 07/21/13 0335  HGB  --   --   < > 13.8  --  13.9 13.1  HCT  --   --   --  42.5  --  42.9 40.1  PLT  --   --   --  189  --  201 210  LABPROT 21.6*  --   --  18.0* 19.3*  --  18.2*  INR 1.88*  --   --  1.49 1.63*  --  1.51*  CREATININE  --   --   < > 0.84  --  0.88 0.85  TROPONINI <0.30 <0.30  --   --   --   --   --   < > = values in this interval not displayed.  Estimated Creatinine Clearance: 73.9 ml/min (by C-G formula based on Cr of 0.85).  Assessment: 3 yom on chronic coumadin for atrial fibrillation was on lovenox for anticoagulation due to inability to take PO. Now taking more PO's so coumadin to be restarted. INR is subtherapeutic at 1.51. CBC is stable and no bleeding noted.   Goal of Therapy:  INR 2-3 Monitor platelets by anticoagulation protocol: Yes   Plan:  1. Warfarin 7.5mg  PO x 1 tonight - will give boosted dose tonight with hopes to resume home dose of 5mg  daily soon 2. Daily INR 3. F/u renal fxn, S&S of bleeding  Salome Arnt, PharmD, BCPS Pager # [phone] 07/21/2013 8:54 AM

## 2013-07-21 NOTE — Progress Notes (Signed)
A Palliative Medicine Consult has been received for Patient David [name]      DOB: 07-18-1940      XMI:[id number].We are attempting to respond as quickly as possible to your patient and families needs. We will get this scheduled as quickly as possible.  If in the meantime we can assist with symptom management or other questions via phone please do not hesitate to call our PMT cell phone [phone].   We are sorry for the delay, Densel Kronick L. Lovena Le, MD MBA The Palliative Medicine Team at [facility] Phone: [phone] Pager: [phone]

## 2013-07-21 NOTE — Progress Notes (Signed)
Speech Language Pathology Treatment: Dysphagia  Patient Details Name: David [name] MRN: [id number] DOB: 10/23/1940 Today's Date: 07/21/2013 Time: 7482-7078 SLP Time Calculation (min): 20 min  Assessment / Plan / Recommendation Clinical Impression  Pt demonstrates very good tolerance of dys 3 (mechanical soft diet and thin liquids with SLP assisting in feeding pt solids, but allowing pt to hold cup for sips of liquids. SLP provided verbal cues to clear throat every 1-3 sips, pt followed with 100% accuracy with appropriate strength to clear airway. Mastication slow but functional, one verbal cue needed over meal for pt to completely clear oral cavity. Continue current diet, discussed meds with RN; she can attempt meds whole, but pt pocketed them in MBS.    HPI HPI: David [name] is a 73 y.o. male presenting with acute dyspnea. PMH is significant for chronic systolic CHF (EF 67-54%; Echo 08/2012), Atrial flutter on coumadin, HTN, COPD w/ emphysema, CAD.    Pertinent Vitals NA  SLP Plan  Continue with current plan of care    Recommendations Diet recommendations: Dysphagia 3 (mechanical soft);Thin liquid Liquids provided via: Cup;No straw Medication Administration: Crushed with puree (ok to try whole meds) Supervision: Staff to assist with self feeding Compensations: Slow rate;Small sips/bites;Clear throat intermittently Postural Changes and/or Swallow Maneuvers: Seated upright 90 degrees              Oral Care Recommendations: Oral care BID Follow up Recommendations: Skilled Nursing facility Plan: Continue with current plan of care    GO    [facility], MA CCC-SLP [phone]  Lynann Beaver 07/21/2013, 10:15 AM

## 2013-07-22 DIAGNOSIS — I2510 Atherosclerotic heart disease of native coronary artery without angina pectoris: Secondary | ICD-10-CM

## 2013-07-22 DIAGNOSIS — I509 Heart failure, unspecified: Secondary | ICD-10-CM

## 2013-07-22 DIAGNOSIS — I5022 Chronic systolic (congestive) heart failure: Secondary | ICD-10-CM

## 2013-07-22 LAB — CBC
HEMATOCRIT: 39.8 % (ref 39.0–52.0)
HEMOGLOBIN: 13.1 g/dL (ref 13.0–17.0)
MCH: 29.6 pg (ref 26.0–34.0)
MCHC: 32.9 g/dL (ref 30.0–36.0)
MCV: 89.8 fL (ref 78.0–100.0)
Platelets: 173 10*3/uL (ref 150–400)
RBC: 4.43 MIL/uL (ref 4.22–5.81)
RDW: 14.5 % (ref 11.5–15.5)
WBC: 7.6 10*3/uL (ref 4.0–10.5)

## 2013-07-22 LAB — BASIC METABOLIC PANEL
Anion gap: 9 (ref 5–15)
BUN: 27 mg/dL — AB (ref 6–23)
CO2: 27 mEq/L (ref 19–32)
CREATININE: 0.9 mg/dL (ref 0.50–1.35)
Calcium: 9 mg/dL (ref 8.4–10.5)
Chloride: 100 mEq/L (ref 96–112)
GFR calc Af Amer: >90 mL/min (ref >90)
GFR calc non Af Amer: 82 mL/min — ABNORMAL LOW (ref >90)
GLUCOSE: 158 mg/dL — AB (ref 70–99)
Potassium: 4.8 mEq/L (ref 3.7–5.3)
Sodium: 136 mEq/L — ABNORMAL LOW (ref 137–147)

## 2013-07-22 LAB — PROTIME-INR
INR: 1.33 (ref 0.00–1.49)
PROTHROMBIN TIME: 16.5 s — AB (ref 11.6–15.2)

## 2013-07-22 MED ORDER — METOPROLOL TARTRATE 50 MG PO TABS: 50.0000 mg | ORAL_TABLET | Freq: Two times a day (BID) | ORAL | Status: DC

## 2013-07-22 MED ORDER — LEVOFLOXACIN 750 MG PO TABS: 750.0000 mg | ORAL_TABLET | ORAL | Status: AC

## 2013-07-22 MED ORDER — ENSURE COMPLETE PO LIQD
237.0000 mL | Freq: Two times a day (BID) | ORAL | Status: DC
  Administered 2013-07-22: 237 mL via ORAL

## 2013-07-22 MED ORDER — WARFARIN SODIUM 7.5 MG PO TABS
7.5000 mg | ORAL_TABLET | Freq: Once | ORAL | Status: DC
  Filled 2013-07-22: qty 1

## 2013-07-22 NOTE — Clinical Social Work Psychosocial (Signed)
Clinical Social Work Department BRIEF PSYCHOSOCIAL ASSESSMENT 07/22/2013  Patient:  David [name], David [name]     Account Number:  [id number]     Admit date:  07/17/2013  Clinical Social Worker:  Lovey Newcomer  Date/Time:  07/22/2013 10:00 AM  Referred by:  Physician  Date Referred:  07/22/2013 Referred for  ALF Placement   Other Referral:   Interview type:  Other - See comment Other interview type:   CSW unable to assess patient or speak with family. Family member [name] did leave a message for CSW stating that patient is from [facility] and plan was for return. CSW made several attempts to contact [name] but had no success. Messages left.    PSYCHOSOCIAL DATA Living Status:  FACILITY Admitted from facility:  [facility] Level of care:  Assisted Living Primary support name:  [name] Primary support relationship to patient:  FAMILY Degree of support available:   Support is fair    CURRENT CONCERNS Current Concerns  Post-Acute Placement   Other Concerns:    SOCIAL WORK ASSESSMENT / PLAN Patient unable to contribute to assessment. CSW unable to speak with family after making several attempts over the past days. CSW spoke with facility manager. Reynold Bowen states that the patient is from [facility] and welcome to return. She states that the patient's family does not come around that often. CSW asked MD if MD thought patient could be successfully managed at ALF setting. MD states that patient will be manageable at ALF level.   Assessment/plan status:  Psychosocial Support/Ongoing Assessment of Needs Other assessment/ plan:   Complete FL2, Fax,   Information/referral to community resources:   NONE    PATIENT'S/FAMILY'S RESPONSE TO PLAN OF CARE: Patient to return to [facility], where he is a long term resident. CSW will assist with DC.       Liz Beach MSW, [place], [place], [phone]

## 2013-07-22 NOTE — Progress Notes (Signed)
OT Cancellation Note  Patient Details Name: David [name] MRN: [id number] DOB: [birth year]   Cancelled Treatment:    Reason Eval/Treat Not Completed: Medical issues which prohibited therapy (intermittent v-tach, pt drowsy and unable to participate.) Will continue to follow.  Malka So 07/22/2013, 10:44 AM

## 2013-07-22 NOTE — Progress Notes (Signed)
Patient being transferred to [facility] per MD order. Report called to [place], Therapist, sports,

## 2013-07-22 NOTE — Progress Notes (Signed)
CALL PAGER [phone] for any questions or notifications regarding this patient   FMTS Attending Daily Note: Davidson Palmieri MD  Attending pager:[phone]  office [phone]  I  have seen and examined this patient, reviewed their chart. I have discussed this patient with the resident. I agree with the resident's findings, assessment and care plan. 

## 2013-07-22 NOTE — Clinical Social Work Note (Addendum)
Per MD patient ready to DC back to [facility]. RN and facility notified of patient's DC. RN given number for report. DC packet on patient's chart. Ambulance transport requested for patient.CSW left message for [name] and Eura Radabaugh to notify them that patient will be transported back to [facility] this afternoon. CSW did receive one message back from [facility] today stating that patient was from [place] and was okay to return to [facility] at discharge. CSW has continued to attempt to reach [facility] without success. CSW confirmed with MD that patient is appropriate for ALF level of care.   CSW signing off. Liz Beach MSW, [place], [place], [phone]

## 2013-07-22 NOTE — Progress Notes (Signed)
Subjective: No CP  Breathing is better than yesterday.   Objective: Filed Vitals:   07/21/13 2022 07/21/13 2355 07/22/13 0407 07/22/13 0700  BP:  93/70 89/65   Pulse:  95 133   Temp:  97.8 F (36.6 C) 97.7 F (36.5 C) 97.4 F (36.3 C)  TempSrc:  Oral Axillary Axillary  Resp:  19 19   Height:   6' (1.829 m)   Weight:   143 lb 1.3 oz (64.9 kg)   SpO2: 96% 93% 94%    Weight change: -5 lb 11.7 oz (-2.6 kg)  Intake/Output Summary (Last 24 hours) at 07/22/13 0814 Last data filed at 07/22/13 0700  Gross per 24 hour  Intake      3 ml  Output    925 ml  Net   -922 ml   Net neg 3.2 L  General: Alert, awake, oriented x3, in no acute distress Neck:  JVP is normal Heart: Irregular rate and rhythm, without murmurs, rubs, gallops.  Lungs: Rhonchi at R base.   Exemities:  Tr edema.   Neuro: patinet mumbles  Moving all extremities  Full neuro eval not done     Lab Results: Results for orders placed during the hospital encounter of 07/17/13 (from the past 24 hour(s))  TROPONIN I     Status: None   Collection Time    07/21/13  3:20 PM      Result Value Ref Range   Troponin I <0.30  <0.30 ng/mL  TROPONIN I     Status: None   Collection Time    07/21/13  6:25 PM      Result Value Ref Range   Troponin I <0.30  <0.30 ng/mL  PROTIME-INR     Status: Abnormal   Collection Time    07/22/13 12:00 AM      Result Value Ref Range   Prothrombin Time 16.5 (*) 11.6 - 15.2 seconds   INR 1.33  0.00 - 1.49  CBC     Status: None   Collection Time    07/22/13 12:00 AM      Result Value Ref Range   WBC 7.6  4.0 - 10.5 K/uL   RBC 4.43  4.22 - 5.81 MIL/uL   Hemoglobin 13.1  13.0 - 17.0 g/dL   HCT 39.8  39.0 - 52.0 %   MCV 89.8  78.0 - 100.0 fL   MCH 29.6  26.0 - 34.0 pg   MCHC 32.9  30.0 - 36.0 g/dL   RDW 14.5  11.5 - 15.5 %   Platelets 173  150 - 400 K/uL  BASIC METABOLIC PANEL     Status: Abnormal   Collection Time    07/22/13 12:00 AM      Result Value Ref Range   Sodium 136 (*) 137 -  147 mEq/L   Potassium 4.8  3.7 - 5.3 mEq/L   Chloride 100  96 - 112 mEq/L   CO2 27  19 - 32 mEq/L   Glucose, Bld 158 (*) 70 - 99 mg/dL   BUN 27 (*) 6 - 23 mg/dL   Creatinine, Ser 0.90  0.50 - 1.35 mg/dL   Calcium 9.0  8.4 - 10.5 mg/dL   GFR calc non Af Amer 82 (*) >90 mL/min   GFR calc Af Amer >90  >90 mL/min   Anion gap 9  5 - 15    Studies/Results: Dg Chest Port 1 View  07/21/2013   CLINICAL DATA:  Difficulty breathing  EXAM: PORTABLE  CHEST - 1 VIEW  COMPARISON:  July 17, 2013  FINDINGS: There is no edema or consolidation. Heart is borderline enlarged with normal pulmonary vascularity. No adenopathy. No pneumothorax. No bone lesions.  IMPRESSION: No edema or consolidation.  Heart borderline enlarged but stable.   Electronically Signed   By: Lowella Grip M.D.   On: 07/21/2013 06:58    Medications:  Reviewed   @PROBHOSP @  1.  Chronic systolic CHF  Echo 5/68/12:  LVEF 40 to 45%.  Volume appears mildlily increased  But BP is limiting  Would continue with rate control for now. And Rx of pulmonary.    2.   Atrial flutter  Rates are improving.    3.  COPD  Receiving ABX and prednisoone.    4.  CAD  RCA 95%  RI 60^    6.  HTN  BP is low      LOS: 5 days   Dorris Carnes 07/22/2013, 8:14 AM

## 2013-07-22 NOTE — Clinical Social Work Note (Signed)
CSW has attempted to assess patient again, but patient unable to contribute and family not answering the phone. CSW left another message. RN, please notify CSW if family shows up, or get a number that they can be reach at anytime.   Liz Beach MSW, [place], [place], [phone]

## 2013-07-22 NOTE — Discharge Instructions (Signed)
You were hospitalized for a COPD exacerbation.  This was making it difficult to breath.  Your breathing is better now.  You should continue to take your antibiotic for 2 more days.  Call your healthcare provider for a follow-up appt as needed, or if you experience SOB or CP.   Chronic Obstructive Pulmonary Disease Chronic obstructive pulmonary disease (COPD) is a common lung condition in which airflow from the lungs is limited. COPD is a general term that can be used to describe many different lung problems that limit airflow, including both chronic bronchitis and emphysema. If you have COPD, your lung function will probably never return to normal, but there are measures you can take to improve lung function and make yourself feel better.  CAUSES   Smoking (common).   Exposure to secondhand smoke.   Genetic problems.  Chronic inflammatory lung diseases or recurrent infections. SYMPTOMS   Shortness of breath, especially with physical activity.   Deep, persistent (chronic) cough with a large amount of thick mucus.   Wheezing.   Rapid breaths (tachypnea).   Gray or bluish discoloration (cyanosis) of the skin, especially in fingers, toes, or lips.   Fatigue.   Weight loss.   Frequent infections or episodes when breathing symptoms become much worse (exacerbations).   Chest tightness. DIAGNOSIS  Your healthcare provider will take a medical history and perform a physical examination to make the initial diagnosis. Additional tests for COPD may include:   Lung (pulmonary) function tests.  Chest X-ray.  CT scan.  Blood tests. TREATMENT  Treatment available to help you feel better when you have COPD include:   Inhaler and nebulizer medicines. These help manage the symptoms of COPD and make your breathing more comfortable  Supplemental oxygen. Supplemental oxygen is only helpful if you have a low oxygen level in your blood.   Exercise and physical activity. These  are beneficial for nearly all people with COPD. Some people may also benefit from a pulmonary rehabilitation program. HOME CARE INSTRUCTIONS   Take all medicines (inhaled or pills) as directed by your health care provider.  Only take over-the-counter or prescription medicines for pain, fever, or discomfort as directed by your health care provider.   Avoid over-the-counter medicines or cough syrups that dry up your airway (such as antihistamines) and slow down the elimination of secretions unless instructed otherwise by your healthcare provider.   If you are a smoker, the most important thing that you can do is stop smoking. Continuing to smoke will cause further lung damage and breathing trouble. Ask your health care provider for help with quitting smoking. He or she can direct you to community resources or hospitals that provide support.  Avoid exposure to irritants such as smoke, chemicals, and fumes that aggravate your breathing.  Use oxygen therapy and pulmonary rehabilitation if directed by your health care provider. If you require home oxygen therapy, ask your healthcare provider whether you should purchase a pulse oximeter to measure your oxygen level at home.   Avoid contact with individuals who have a contagious illness.  Avoid extreme temperature and humidity changes.  Eat healthy foods. Eating smaller, more frequent meals and resting before meals may help you maintain your strength.  Stay active, but balance activity with periods of rest. Exercise and physical activity will help you maintain your ability to do things you want to do.  Preventing infection and hospitalization is very important when you have COPD. Make sure to receive all the vaccines your health care  provider recommends, especially the pneumococcal and influenza vaccines. Ask your healthcare provider whether you need a pneumonia vaccine.  Learn and use relaxation techniques to manage stress.  Learn and use  controlled breathing techniques as directed by your health care provider. Controlled breathing techniques include:   Pursed lip breathing. Start by breathing in (inhaling) through your nose for 1 second. Then, purse your lips as if you were going to whistle and breathe out (exhale) through the pursed lips for 2 seconds.   Diaphragmatic breathing. Start by putting one hand on your abdomen just above your waist. Inhale slowly through your nose. The hand on your abdomen should move out. Then purse your lips and exhale slowly. You should be able to feel the hand on your abdomen moving in as you exhale.   Learn and use controlled coughing to clear mucus from your lungs. Controlled coughing is a series of short, progressive coughs. The steps of controlled coughing are:  1. Lean your head slightly forward.  2. Breathe in deeply using diaphragmatic breathing.  3. Try to hold your breath for 3 seconds.  4. Keep your mouth slightly open while coughing twice.  5. Spit any mucus out into a tissue.  6. Rest and repeat the steps once or twice as needed. SEEK MEDICAL CARE IF:   You are coughing up more mucus than usual.   There is a change in the color or thickness of your mucus.   Your breathing is more labored than usual.   Your breathing is faster than usual.  SEEK IMMEDIATE MEDICAL CARE IF:   You have shortness of breath while you are resting.   You have shortness of breath that prevents you from:  Being able to talk.   Performing your usual physical activities.   You have chest pain lasting longer than 5 minutes.   Your skin color is more cyanotic than usual.  You measure low oxygen saturations for longer than 5 minutes with a pulse oximeter. MAKE SURE YOU:   Understand these instructions.  Will watch your condition.  Will get help right away if you are not doing well or get worse. Document Released: 10/17/2004 Document Revised: 10/28/2012 Document Reviewed:  09/03/2012 [facility] Patient Information 2015 [place], [place]. This information is not intended to replace advice given to you by your health care provider. Make sure you discuss any questions you have with your health care provider.

## 2013-07-22 NOTE — Discharge Summary (Signed)
Family Medicine Teaching Service  Discharge Note : Attending Dorcas Mcmurray MD Pager [phone] Office [phone] I have seen and examined this patient, reviewed their chart and discussed discharge planning with the resident at the time of discharge. I agree with the discharge plan as above.

## 2013-07-22 NOTE — Progress Notes (Signed)
Palliative care continues to shadow and follow in chart. Please contact us when decision maker is identified and contacted so we may address goals of care. Thank you.  Vinie Sill, NP Palliative Medicine Team Pager # [phone] (M-F 8a-5p) Team Phone # [phone] (Nights/Weekends)

## 2013-07-22 NOTE — Progress Notes (Signed)
Report called to RN at [facility] prior to patient leaving -

## 2013-07-22 NOTE — Progress Notes (Signed)
CALL PAGER [phone] for any questions or notifications regarding this patient   FMTS Attending Daily Note: Westlyn Glaza MD  Attending pager:[phone]  office [phone]  I  have seen and examined this patient, reviewed their chart. I have discussed this patient with the resident. I agree with the resident's findings, assessment and care plan. 

## 2013-07-22 NOTE — Clinical Social Work Note (Signed)
CSW was given additional contact info for patient's sister Rise Paganini [phone]), but this is no longer Beverly's contact information. CSW will continue to try to reach family.   Liz Beach MSW, [place], [place], [phone]

## 2013-07-22 NOTE — Progress Notes (Signed)
NUTRITION FOLLOW UP  Intervention:    Ensure Complete PO BID, each supplement provides 350 kcal and 13 grams of protein  Nutrition Dx:   Inadequate oral intake related to inability to eat with dysphagia as evidenced by poor intake of meals.  Goal:   Intake to meet >90% of estimated nutrition needs.  Monitor:   PO intake, labs, weight trend.  Assessment:   73 year old male with PMH relevant for CHF with LVEF 40 to 45%, atrial flutter on coumadin, HTN, COPD, CAD and heavy alcohol use with last drink a few days ago. Presented with progressive SOB and wheezing. His chest X ray at admission was clear.   SLP following for dysphagia. Diet has been advanced to dysphagia 3 with thin liquids. Consuming </= 50% of meals.   Height: Ht Readings from Last 1 Encounters:  07/22/13 6' (1.829 m)    Weight Status:   Wt Readings from Last 1 Encounters:  07/22/13 143 lb 1.3 oz (64.9 kg)  07/19/13  143 lb 1.3 oz (64.9 kg)   Re-estimated needs:  Kcal: 1800-2000  Protein: 85-100 gm  Fluid: 1.8-2 L  Skin: WDL  Diet Order: Dysphagia 3 with thin liquids   Intake/Output Summary (Last 24 hours) at 07/22/13 1246 Last data filed at 07/22/13 1200  Gross per 24 hour  Intake    123 ml  Output    875 ml  Net   -752 ml    Last BM: 6/27   Labs:   Recent Labs Lab 07/20/13 0805 07/21/13 0335 07/22/13  NA 142 135* 136*  K 5.1 5.3 4.8  CL 103 98 100  CO2 26 25 27   BUN 30* 28* 27*  CREATININE 0.88 0.85 0.90  CALCIUM 9.3 9.0 9.0  GLUCOSE 146* 153* 158*    CBG (last 3)  No results found for this basename: GLUCAP,  in the last 72 hours  Scheduled Meds: . antiseptic oral rinse  15 mL Mouth Rinse q12n4p  . atorvastatin  40 mg Oral q1800  . chlorhexidine  15 mL Mouth Rinse BID  . DULoxetine  60 mg Oral Daily  . fluticasone  2 puff Inhalation BID  . folic acid  1 mg Oral Daily  . ipratropium-albuterol  3 mL Nebulization TID  . levofloxacin  750 mg Oral Q24H  . metoprolol tartrate  50  mg Oral BID  . mirtazapine  15 mg Oral QHS  . multivitamin with minerals  1 tablet Oral Daily  . paliperidone  6 mg Oral Daily  . pantoprazole (PROTONIX) IV  40 mg Intravenous Q24H  . predniSONE  50 mg Oral Q breakfast  . sodium chloride  3 mL Intravenous Q12H  . thiamine  100 mg Oral Daily   Or  . thiamine  100 mg Intravenous Daily  . topiramate  25 mg Oral QHS  . warfarin  7.5 mg Oral ONCE-1800  . Warfarin - Pharmacist Dosing Inpatient   Does not apply q1800    Continuous Infusions: . albuterol Stopped (07/17/13 1916)  . albuterol      Molli Barrows, RD, LDN, [place] Pager [phone] After Hours Pager [phone]

## 2013-07-22 NOTE — Progress Notes (Signed)
Family Medicine Teaching Service Daily Progress Note Intern Pager: [phone]  Patient name: David [name] Medical record number: [id number] Date of birth: [birth year] Age: 73 y.o. Gender: male  Primary Care Provider: PROVIDER NOT IN SYSTEM Consultants: None Code Status: DNR (confirmed with [facility] - faxed documentation)  Pt Overview and Major Events to Date:  6/27 - Admitted  Assessment and Plan: David [name] is a 73 y.o. male presenting with acute dyspnea. PMH is significant for chronic systolic CHF (EF 81-01%; Echo 08/2012), Atrial flutter on coumadin, HTN, COPD w/ emphysema, CAD.   # Acute COPD exacerbation, in setting of chronic ILD and chronic respiratory failure - On admission, suspected predominantly due to COPD with diffuse wheezing / coarse breath sounds, however elevated BNP 5636. Afebrile, Normal WBC count. Normal lactic acid. Chest ray unremarkable. s/p Lasix 40mg  IV x 1 dose (good UOP 1.6 L), unlikely acute CHF exacerbation, does not appear volume overloaded - On RA (POx >93%), resp status improved, persistent diffuse rhonchi, likely 2/2 chronic interstitial lung disease.  - Continue Duonebs Q4/Albuterol Q2PRN, Flovent - Continue Levaquin PO and Prednisone 50mg  qday for COPD exac - Consulted PCCM - re: hypoxemia / hypercarbia / respiratory distress - greatly appreciate assistance - Consulted Palliative Care - attempted to contact family yesterday (unsuccessful)    Atrial flutter  - Persistent tachycardia in 110-120s with atrial flutter - Switched to Metoprolol PO 50mg  BID for rate control  - Avoid dilt in the setting of LV dysfunction and amiodarone with lung disease, per Cards - Continue Warfarin (per pharm) - Troponins negative (7/1) - Cardiology following, appreciate recs  History of alcohol abuse - unlikely withdrawal - Continue CIWA protocol   HTN  - BP's 75Z-025E systolic, stable - Will continue to monitor closely   Hx of Hypothyroidism  - Not on  synthroid currently  - TSH suppressed at 0.237; FT3 (2.7 nml) and FT4 (0.91, nml)  Psych - Unclear diagnosis. Patient appears to have baseline dementia. History of depression - Passed swallow study (Dys-3 diet) with crushed meds / puree - Continue PO meds - Invega, Cymbalta - continue to Hold Remeron, Topamax, Seroquel to avoid oversedation  CAD, HLD  - Continue statin (lipitor substituted for crestor)   + Hemoccult  - Hemoglobin stable and WNL - Will continue to trend daily   Hyperglycemia  - Elevated on BMP  - A1C 7.0  FEN/GI: Dys-3, crushed meds with puree ; SL IV.  Prophylaxis: Coumadin Per Pharm   Disposition: Pending clinical improvement.  Subjective:  Respiratory status remains improved today, persistent coarse breath sounds, on RA, less somnolent, improved verbal response, disoriented. No acute events overnight.  Objective: Temp:  [97.4 F (36.3 C)-97.8 F (36.6 C)] 97.7 F (36.5 C) (07/02 0407) Pulse Rate:  [34-138] 133 (07/02 0407) Resp:  [19-22] 19 (07/02 0407) BP: (89-121)/(63-79) 89/65 mmHg (07/02 0407) SpO2:  [93 %-100 %] 94 % (07/02 0407) Weight:  [143 lb 1.3 oz (64.9 kg)] 143 lb 1.3 oz (64.9 kg) (07/02 0407)  Physical Exam: General: chronically ill appearing male, resting in bed, NAD.  Cardiovascular: Tachycardic, Irregular. No murmur noted. Respiratory: Improved WOB, diffuse coarse breath sounds with rhonchi and scattered wheezes throughout all lung fields. No respiratory distress. Abdomen: soft, NTND, +active BS. Extremities: No LE edema.  Neuro: somnolent, Not oriented to place or time, improved alertness and verbal responses. Follows most commands, grossly non-focal  Laboratory:  Recent Labs Lab 07/20/13 0805 07/21/13 0335 07/22/13  WBC 7.6 6.3 7.6  HGB 13.9  13.1 13.1  HCT 42.9 40.1 39.8  PLT 201 210 173    Recent Labs Lab 07/17/13 1549  07/20/13 0805 07/21/13 0335 07/22/13  NA 151*  < > 142 135* 136*  K 5.1  < > 5.1 5.3 4.8  CL 94*   < > 103 98 100  CO2 25  < > 26 25 27   BUN 18  < > 30* 28* 27*  CREATININE 0.78  < > 0.88 0.85 0.90  CALCIUM 9.2  < > 9.3 9.0 9.0  PROT 7.4  --   --   --   --   BILITOT 0.3  --   --   --   --   ALKPHOS 77  --   --   --   --   ALT 21  --   --   --   --   AST 31  --   --   --   --   GLUCOSE 170*  < > 146* 153* 158*  < > = values in this interval not displayed. BNP (last 3 results)  Recent Labs  01/23/13 1653 05/05/13 2215 07/17/13 1549  PROBNP 1890.0* 1000.0* 5636.0*   Troponin - POC Negative   Lactic Acid - 1.64 (normal)  FOBT - Positive.   EKG - Atrial flutter (rate 114).   VBG: 7.296/55.1/138/26.9  ABG    Component Value Date/Time   PHART 7.353 07/18/2013 1814   PCO2ART 51.9* 07/18/2013 1814   PO2ART 70.5* 07/18/2013 1814   HCO3 28.1* 07/18/2013 1814   TCO2 29.7 07/18/2013 1814   ACIDBASEDEF 1.0 07/17/2013 1616   O2SAT 92.2 07/18/2013 1814    Imaging/Diagnostic Tests:  Dg Chest Portable 1 View  07/17/2013 IMPRESSION: 1. Stable cardiomegaly and chronic interstitial changes without acute or superimposed abnormality.   [name], MD 07/22/2013, 7:16 AM PGY-1, [place] Intern pager: [phone], text pages welcome

## 2013-07-22 NOTE — Progress Notes (Signed)
ANTICOAGULATION CONSULT NOTE - Follow Up Consult  Pharmacy Consult for coumadin Indication: atrial fibrillation  Allergies  Allergen Reactions  . Ace Inhibitors   . Oat Flour [Oat]     Unknown per MAR  . Other     Greens: unknown reaction per [facility]    Patient Measurements: Height: 6' (182.9 cm) Weight: 143 lb 1.3 oz (64.9 kg) IBW/kg (Calculated) : 77.6  Vital Signs: Temp: 97.4 F (36.3 C) (07/02 0800) Temp src: Axillary (07/02 0800) BP: 91/63 mmHg (07/02 0852) Pulse Rate: 134 (07/02 0852)  Labs:  Recent Labs  07/20/13 0230  07/20/13 0805 07/21/13 0335 07/21/13 1520 07/21/13 1825 07/22/13  HGB  --   < > 13.9 13.1  --   --  13.1  HCT  --   --  42.9 40.1  --   --  39.8  PLT  --   --  201 210  --   --  173  LABPROT 19.3*  --   --  18.2*  --   --  16.5*  INR 1.63*  --   --  1.51*  --   --  1.33  CREATININE  --   --  0.88 0.85  --   --  0.90  TROPONINI  --   --   --   --  <0.30 <0.30  --   < > = values in this interval not displayed.  Estimated Creatinine Clearance: 67.1 ml/min (by C-G formula based on Cr of 0.9).  Assessment: 30 yom on chronic coumadin for atrial fibrillation was on lovenox for anticoagulation due to inability to take PO. Now taking more PO's so coumadin to be restarted 7/1. INR is subtherapeutic at 1.33. CBC is stable and no bleeding noted. Of note, pt is also on levaquin which may increase the INR.   Goal of Therapy:  INR 2-3   Plan:  1. Warfarin 7.5mg  PO x 1 tonight  2. Daily INR 3. F/u renal fxn, S&S of bleeding  Salome Arnt, PharmD, BCPS Pager # [phone] 07/22/2013 8:58 AM

## 2013-07-22 NOTE — Progress Notes (Signed)
PT Cancellation Note  Patient Details Name: David [name] MRN: [id number] DOB: 1941-01-15   Cancelled Treatment:    Reason Eval/Treat Not Completed: Medical issues which prohibited therapy.  Pt very drowsy and difficult to arouse.  While in room pt monitor alarming V-tach with HR increasing above 200, but not sustaining.  Pt sleeping and in no apparent distress.  RN made aware.  Will hold PT at this time and follow up another time.     Kashis Penley, Thornton Papas 07/22/2013, 9:48 AM

## 2013-07-25 ENCOUNTER — Encounter (HOSPITAL_COMMUNITY): Payer: Self-pay | Admitting: Emergency Medicine

## 2013-07-25 ENCOUNTER — Inpatient Hospital Stay (HOSPITAL_COMMUNITY)
Admission: EM | Admit: 2013-07-25 | Discharge: 2013-08-02 | DRG: 190 | Disposition: A | Discharge status: Skilled Nursing Facility | Payer: PRIVATE HEALTH INSURANCE | Attending: Family Medicine | Admitting: Family Medicine

## 2013-07-25 ENCOUNTER — Emergency Department (HOSPITAL_COMMUNITY): Payer: PRIVATE HEALTH INSURANCE

## 2013-07-25 DIAGNOSIS — J9610 Chronic respiratory failure, unspecified whether with hypoxia or hypercapnia: Secondary | ICD-10-CM | POA: Diagnosis present

## 2013-07-25 DIAGNOSIS — F17200 Nicotine dependence, unspecified, uncomplicated: Secondary | ICD-10-CM | POA: Diagnosis present

## 2013-07-25 DIAGNOSIS — Z66 Do not resuscitate: Secondary | ICD-10-CM | POA: Diagnosis present

## 2013-07-25 DIAGNOSIS — E785 Hyperlipidemia, unspecified: Secondary | ICD-10-CM | POA: Diagnosis present

## 2013-07-25 DIAGNOSIS — F0390 Unspecified dementia without behavioral disturbance: Secondary | ICD-10-CM | POA: Diagnosis present

## 2013-07-25 DIAGNOSIS — F329 Major depressive disorder, single episode, unspecified: Secondary | ICD-10-CM | POA: Diagnosis present

## 2013-07-25 DIAGNOSIS — I483 Typical atrial flutter: Secondary | ICD-10-CM

## 2013-07-25 DIAGNOSIS — I5023 Acute on chronic systolic (congestive) heart failure: Secondary | ICD-10-CM

## 2013-07-25 DIAGNOSIS — I10 Essential (primary) hypertension: Secondary | ICD-10-CM

## 2013-07-25 DIAGNOSIS — R0602 Shortness of breath: Secondary | ICD-10-CM

## 2013-07-25 DIAGNOSIS — I4891 Unspecified atrial fibrillation: Secondary | ICD-10-CM | POA: Diagnosis present

## 2013-07-25 DIAGNOSIS — F3289 Other specified depressive episodes: Secondary | ICD-10-CM | POA: Diagnosis present

## 2013-07-25 DIAGNOSIS — J439 Emphysema, unspecified: Secondary | ICD-10-CM

## 2013-07-25 DIAGNOSIS — Z720 Tobacco use: Secondary | ICD-10-CM

## 2013-07-25 DIAGNOSIS — I2510 Atherosclerotic heart disease of native coronary artery without angina pectoris: Secondary | ICD-10-CM

## 2013-07-25 DIAGNOSIS — D72829 Elevated white blood cell count, unspecified: Secondary | ICD-10-CM | POA: Diagnosis present

## 2013-07-25 DIAGNOSIS — Z8546 Personal history of malignant neoplasm of prostate: Secondary | ICD-10-CM

## 2013-07-25 DIAGNOSIS — E875 Hyperkalemia: Secondary | ICD-10-CM | POA: Diagnosis present

## 2013-07-25 DIAGNOSIS — R0600 Dyspnea, unspecified: Secondary | ICD-10-CM | POA: Diagnosis present

## 2013-07-25 DIAGNOSIS — J690 Pneumonitis due to inhalation of food and vomit: Secondary | ICD-10-CM

## 2013-07-25 DIAGNOSIS — I959 Hypotension, unspecified: Secondary | ICD-10-CM | POA: Diagnosis present

## 2013-07-25 DIAGNOSIS — E43 Unspecified severe protein-calorie malnutrition: Secondary | ICD-10-CM

## 2013-07-25 DIAGNOSIS — I255 Ischemic cardiomyopathy: Secondary | ICD-10-CM

## 2013-07-25 DIAGNOSIS — Z85038 Personal history of other malignant neoplasm of large intestine: Secondary | ICD-10-CM

## 2013-07-25 DIAGNOSIS — E039 Hypothyroidism, unspecified: Secondary | ICD-10-CM | POA: Diagnosis present

## 2013-07-25 DIAGNOSIS — Z515 Encounter for palliative care: Secondary | ICD-10-CM

## 2013-07-25 DIAGNOSIS — Z8042 Family history of malignant neoplasm of prostate: Secondary | ICD-10-CM

## 2013-07-25 DIAGNOSIS — I509 Heart failure, unspecified: Secondary | ICD-10-CM | POA: Diagnosis present

## 2013-07-25 DIAGNOSIS — I5022 Chronic systolic (congestive) heart failure: Secondary | ICD-10-CM

## 2013-07-25 DIAGNOSIS — Z7901 Long term (current) use of anticoagulants: Secondary | ICD-10-CM

## 2013-07-25 DIAGNOSIS — I2589 Other forms of chronic ischemic heart disease: Secondary | ICD-10-CM | POA: Diagnosis present

## 2013-07-25 DIAGNOSIS — F1010 Alcohol abuse, uncomplicated: Secondary | ICD-10-CM | POA: Diagnosis present

## 2013-07-25 DIAGNOSIS — I2584 Coronary atherosclerosis due to calcified coronary lesion: Secondary | ICD-10-CM

## 2013-07-25 DIAGNOSIS — I4892 Unspecified atrial flutter: Secondary | ICD-10-CM

## 2013-07-25 DIAGNOSIS — J441 Chronic obstructive pulmonary disease with (acute) exacerbation: Principal | ICD-10-CM

## 2013-07-25 LAB — COMPREHENSIVE METABOLIC PANEL
ALBUMIN: 3 g/dL — AB (ref 3.5–5.2)
ALK PHOS: 105 U/L (ref 39–117)
ALT: 92 U/L — ABNORMAL HIGH (ref 0–53)
AST: 79 U/L — ABNORMAL HIGH (ref 0–37)
Anion gap: 16 — ABNORMAL HIGH (ref 5–15)
BUN: 29 mg/dL — ABNORMAL HIGH (ref 6–23)
CHLORIDE: 97 meq/L (ref 96–112)
CO2: 22 mEq/L (ref 19–32)
Calcium: 9.2 mg/dL (ref 8.4–10.5)
Creatinine, Ser: 0.97 mg/dL (ref 0.50–1.35)
GFR calc Af Amer: >90 mL/min (ref >90)
GFR calc non Af Amer: 80 mL/min — ABNORMAL LOW (ref >90)
GLUCOSE: 204 mg/dL — AB (ref 70–99)
POTASSIUM: 5 meq/L (ref 3.7–5.3)
Sodium: 135 mEq/L — ABNORMAL LOW (ref 137–147)
Total Bilirubin: 0.9 mg/dL (ref 0.3–1.2)
Total Protein: 6.5 g/dL (ref 6.0–8.3)

## 2013-07-25 LAB — CBC
HEMATOCRIT: 41.6 % (ref 39.0–52.0)
HEMOGLOBIN: 13.7 g/dL (ref 13.0–17.0)
MCH: 29.9 pg (ref 26.0–34.0)
MCHC: 32.9 g/dL (ref 30.0–36.0)
MCV: 90.8 fL (ref 78.0–100.0)
Platelets: 175 10*3/uL (ref 150–400)
RBC: 4.58 MIL/uL (ref 4.22–5.81)
RDW: 15.4 % (ref 11.5–15.5)
WBC: 12 10*3/uL — AB (ref 4.0–10.5)

## 2013-07-25 LAB — PRO B NATRIURETIC PEPTIDE: Pro B Natriuretic peptide (BNP): 21407 pg/mL — ABNORMAL HIGH (ref 0–125)

## 2013-07-25 LAB — TROPONIN I: Troponin I: <0.3 ng/mL (ref <0.30)

## 2013-07-25 MED ORDER — IPRATROPIUM-ALBUTEROL 0.5-2.5 (3) MG/3ML IN SOLN
3.0000 mL | Freq: Once | RESPIRATORY_TRACT | Status: AC
  Administered 2013-07-25: 3 mL via RESPIRATORY_TRACT
  Filled 2013-07-25: qty 3

## 2013-07-25 MED ORDER — METHYLPREDNISOLONE SODIUM SUCC 125 MG IJ SOLR
125.0000 mg | Freq: Once | INTRAMUSCULAR | Status: AC
  Administered 2013-07-25: 125 mg via INTRAVENOUS
  Filled 2013-07-25: qty 2

## 2013-07-25 MED ORDER — SODIUM CHLORIDE 0.9 % IV BOLUS (SEPSIS)
500.0000 mL | Freq: Once | INTRAVENOUS | Status: AC
  Administered 2013-07-26: 500 mL via INTRAVENOUS

## 2013-07-25 NOTE — ED Notes (Signed)
Breathing difficulty gradually increasing over past few days.  EMS gave breathing tx en route, pt reports improvement.

## 2013-07-25 NOTE — ED Provider Notes (Signed)
CSN: [id number]     Arrival date & time 07/25/13  2148 History   First MD Initiated Contact with Patient 07/25/13 2244     Chief Complaint  Patient presents with  . Shortness of Breath   (Consider location/radiation/quality/duration/timing/severity/associated sxs/prior Treatment) HPI  73 y/o male with PMH COPD, chronic A flutter and ischemic cardiomyopathy that present with worsening moderate SOB with out any chest pain with onset 3 days prior and acutely worse today. Patient denies fever/cough/congestion. No increasing lower extremity edema. During transport patient received Duo-neb from EMS with come improvement in symptoms.    Past Medical History  Diagnosis Date  . Community acquired pneumonia     Right middle lobe community-acquired pneumonia  . Toxic metabolic encephalopathy   . Chronic atrial flutter     a. rate controlled with bb therapy;  b. h/o bradycardia when on digoxin 12/2011.  . Tobacco abuse   . History of coronary artery disease 2008    a. Cath at [facility]: RCA 95%, RI 60%  - medical therapy  . Ischemic cardiomyopathy     a. prev documented EF of 20-30%;  b. 09/2012 Echo: EF 40-45%, diff HK, mild MR, mild bi-atrial enlargement.  . History of prostate cancer   . Chronic obstructive pulmonary disease     with mild bronchospasm, improved  . Hypertension   . History of alcohol abuse     History of prior heavy alcohol abuse  . History of depression   . Altered mental status 2012   . Noncompliance   . Depression   . Ulcer   . Hx of migraines   . Hyperlipidemia   . Colon cancer    Past Surgical History  Procedure Laterality Date  . Appendectomy     Family History  Problem Relation Age of Onset  . Cancer Father     Prostate Cancer  . Heart disease Other     Parent  . Alcohol abuse Other     Parent  . Arthritis Other     parent  . Hypertension Other     Parent   History  Substance Use Topics  . Smoking status: Current Every Day Smoker -- 0.50  packs/day for 40 years    Types: Cigarettes  . Smokeless tobacco: Never Used  . Alcohol Use: No    Review of Systems  Constitutional: Negative for activity change.  HENT: Negative for congestion.   Eyes: Negative for visual disturbance.  Respiratory: Positive for shortness of breath. Negative for cough.   Cardiovascular: Negative for chest pain and leg swelling.  Gastrointestinal: Negative for abdominal pain and blood in stool.  Genitourinary: Negative for dysuria and hematuria.  Musculoskeletal: Negative for back pain.  Skin: Negative for color change.  Neurological: Negative for syncope and headaches.  Psychiatric/Behavioral: Negative for agitation.      Allergies  Ace inhibitors; Oat flour; and Other  Home Medications   Prior to Admission medications   Medication Sig Start Date End Date Taking? Authorizing Provider  albuterol (PROAIR HFA) 108 (90 BASE) MCG/ACT inhaler Inhale 2 puffs into the lungs every 6 (six) hours as needed for wheezing or shortness of breath.    Historical Provider, MD  beclomethasone (QVAR) 40 MCG/ACT inhaler Inhale 2 puffs into the lungs 2 (two) times daily.    Historical Provider, MD  DULoxetine (CYMBALTA) 60 MG capsule Take 60 mg by mouth daily.    Historical Provider, MD  folic acid (FOLVITE) 1 MG tablet Take 1 tablet (1  mg total) by mouth daily. 12/31/11   Bobby Rumpf [place], PA-C  guaiFENesin (MUCINEX) 600 MG 12 hr tablet Take 1 tablet (600 mg total) by mouth 2 (two) times daily. 12/31/11   Bobby Rumpf [place], PA-C  lactulose (CHRONULAC) 10 GM/15ML solution Take 20 g by mouth daily.    Historical Provider, MD  metoprolol (LOPRESSOR) 50 MG tablet Take 1 tablet (50 mg total) by mouth 2 (two) times daily. 07/22/13   [name], MD  mirtazapine (REMERON) 15 MG tablet Take 15 mg by mouth at bedtime.    Historical Provider, MD  Multiple Vitamin (MULTIVITAMIN WITH MINERALS) TABS tablet Take 1 tablet by mouth daily.    Historical Provider, MD  paliperidone  (INVEGA) 6 MG 24 hr tablet Take 6 mg by mouth daily.     Historical Provider, MD  rosuvastatin (CRESTOR) 20 MG tablet Take 20 mg by mouth daily.    Historical Provider, MD  thiamine (VITAMIN B-1) 100 MG tablet Take 100 mg by mouth daily.    Historical Provider, MD  tiotropium (SPIRIVA) 18 MCG inhalation capsule Place 18 mcg into inhaler and inhale daily. 11/06/12   Chesley Mires, MD  warfarin (COUMADIN) 5 MG tablet Take 5 mg by mouth every evening. 5pm    Historical Provider, MD   BP 101/63  Pulse 42  Temp(Src) 98.4 F (36.9 C) (Oral)  Resp 16  Ht 6' (1.829 m)  Wt 148 lb (67.132 kg)  BMI 20.07 kg/m2  SpO2 100% Physical Exam  Nursing note and vitals reviewed. Constitutional: He is oriented to person, place, and time. He appears well-developed and well-nourished.  HENT:  Head: Normocephalic.  Eyes: Pupils are equal, round, and reactive to light.  Neck: Neck supple.  Cardiovascular: Regular rhythm.  Exam reveals no gallop and no friction rub.   No murmur heard. Tachycardic   Pulmonary/Chest: Effort normal. No respiratory distress.  Course breath sounds through out   Abdominal: Soft. He exhibits no distension. There is no tenderness.  Musculoskeletal: He exhibits no edema.  Neurological: He is alert and oriented to person, place, and time.  Skin: Skin is warm.  Psychiatric: He has a normal mood and affect.    ED Course  Procedures (including critical care time) Labs Review Labs Reviewed  CBC - Abnormal; Notable for the following:    WBC 12.0 (*)    All other components within normal limits  COMPREHENSIVE METABOLIC PANEL - Abnormal; Notable for the following:    Sodium 135 (*)    Glucose, Bld 204 (*)    BUN 29 (*)    Albumin 3.0 (*)    AST 79 (*)    ALT 92 (*)    GFR calc non Af Amer 80 (*)    Anion gap 16 (*)    All other components within normal limits  PRO B NATRIURETIC PEPTIDE - Abnormal; Notable for the following:    Pro B Natriuretic peptide (BNP) 21407.0 (*)     All other components within normal limits  TROPONIN I    Imaging Review Dg Chest Port 1 View  07/25/2013   CLINICAL DATA:  Shortness of breath.  EXAM: PORTABLE CHEST - 1 VIEW  COMPARISON:  07/21/2013  FINDINGS: The heart size and mediastinal contours are within normal limits. Both lungs are clear. The visualized skeletal structures are unremarkable.  IMPRESSION: No active disease.   Electronically Signed   By: Lucienne Capers M.D.   On: 07/25/2013 22:28     EKG Interpretation None  MDM   Final diagnoses:  Shortness of breath    73 y/o male with COPD and CHF presents with SOB. Treated with solumedrol and duo-neb with some improvement. CXR negative for edema or pneumonia. EKG shows a flutter with out change from prior. Patient noted to be hypotensive. At times secondary to position of cuff. Treated with 500cc NS bolus with good response.  Patient admitted to the family medicine service under Dr. Read Drivers     Claudean Severance, MD 07/26/13 [phone]

## 2013-07-25 NOTE — ED Notes (Signed)
Attempted peak flow.  Pt effort very poor.  Did not register on meter. MD aware.

## 2013-07-26 DIAGNOSIS — I4892 Unspecified atrial flutter: Secondary | ICD-10-CM

## 2013-07-26 DIAGNOSIS — R0600 Dyspnea, unspecified: Secondary | ICD-10-CM | POA: Diagnosis present

## 2013-07-26 DIAGNOSIS — I517 Cardiomegaly: Secondary | ICD-10-CM

## 2013-07-26 DIAGNOSIS — I2589 Other forms of chronic ischemic heart disease: Secondary | ICD-10-CM

## 2013-07-26 DIAGNOSIS — I5023 Acute on chronic systolic (congestive) heart failure: Secondary | ICD-10-CM

## 2013-07-26 DIAGNOSIS — F17200 Nicotine dependence, unspecified, uncomplicated: Secondary | ICD-10-CM

## 2013-07-26 DIAGNOSIS — J449 Chronic obstructive pulmonary disease, unspecified: Secondary | ICD-10-CM

## 2013-07-26 DIAGNOSIS — R0602 Shortness of breath: Secondary | ICD-10-CM

## 2013-07-26 LAB — COMPREHENSIVE METABOLIC PANEL
ALK PHOS: 92 U/L (ref 39–117)
ALT: 91 U/L — AB (ref 0–53)
AST: 63 U/L — ABNORMAL HIGH (ref 0–37)
Albumin: 3 g/dL — ABNORMAL LOW (ref 3.5–5.2)
Anion gap: 13 (ref 5–15)
BUN: 28 mg/dL — ABNORMAL HIGH (ref 6–23)
CALCIUM: 8.8 mg/dL (ref 8.4–10.5)
CO2: 26 meq/L (ref 19–32)
Chloride: 99 mEq/L (ref 96–112)
Creatinine, Ser: 1.03 mg/dL (ref 0.50–1.35)
GFR calc Af Amer: 81 mL/min — ABNORMAL LOW (ref >90)
GFR, EST NON AFRICAN AMERICAN: 70 mL/min — AB (ref >90)
GLUCOSE: 184 mg/dL — AB (ref 70–99)
Potassium: 4.7 mEq/L (ref 3.7–5.3)
SODIUM: 138 meq/L (ref 137–147)
TOTAL PROTEIN: 6.2 g/dL (ref 6.0–8.3)
Total Bilirubin: 1.1 mg/dL (ref 0.3–1.2)

## 2013-07-26 LAB — PROTIME-INR
INR: 1.84 — ABNORMAL HIGH (ref 0.00–1.49)
Prothrombin Time: 21.3 seconds — ABNORMAL HIGH (ref 11.6–15.2)

## 2013-07-26 LAB — CBC
HEMATOCRIT: 39.1 % (ref 39.0–52.0)
HEMOGLOBIN: 12.6 g/dL — AB (ref 13.0–17.0)
MCH: 29.3 pg (ref 26.0–34.0)
MCHC: 32.2 g/dL (ref 30.0–36.0)
MCV: 90.9 fL (ref 78.0–100.0)
Platelets: 161 10*3/uL (ref 150–400)
RBC: 4.3 MIL/uL (ref 4.22–5.81)
RDW: 15.6 % — ABNORMAL HIGH (ref 11.5–15.5)
WBC: 10.5 10*3/uL (ref 4.0–10.5)

## 2013-07-26 LAB — CORTISOL: CORTISOL PLASMA: 38.8 ug/dL

## 2013-07-26 LAB — AMMONIA: AMMONIA: 37 umol/L (ref 11–60)

## 2013-07-26 MED ORDER — IPRATROPIUM-ALBUTEROL 0.5-2.5 (3) MG/3ML IN SOLN
3.0000 mL | Freq: Two times a day (BID) | RESPIRATORY_TRACT | Status: DC
  Administered 2013-07-26 – 2013-08-02 (×11): 3 mL via RESPIRATORY_TRACT
  Filled 2013-07-26 (×12): qty 3

## 2013-07-26 MED ORDER — IPRATROPIUM-ALBUTEROL 0.5-2.5 (3) MG/3ML IN SOLN
3.0000 mL | Freq: Once | RESPIRATORY_TRACT | Status: AC
  Administered 2013-07-26: 3 mL via RESPIRATORY_TRACT
  Filled 2013-07-26: qty 3

## 2013-07-26 MED ORDER — GUAIFENESIN 100 MG/5ML PO SYRP
300.0000 mg | ORAL_SOLUTION | Freq: Four times a day (QID) | ORAL | Status: DC
  Administered 2013-07-26 – 2013-08-02 (×23): 300 mg via ORAL
  Filled 2013-07-26 (×30): qty 15

## 2013-07-26 MED ORDER — TIOTROPIUM BROMIDE MONOHYDRATE 18 MCG IN CAPS
18.0000 ug | ORAL_CAPSULE | Freq: Every day | RESPIRATORY_TRACT | Status: DC
  Administered 2013-07-26 – 2013-07-31 (×6): 18 ug via RESPIRATORY_TRACT
  Filled 2013-07-26 (×2): qty 5

## 2013-07-26 MED ORDER — ADULT MULTIVITAMIN W/MINERALS CH
1.0000 | ORAL_TABLET | Freq: Every day | ORAL | Status: DC
  Administered 2013-07-26 – 2013-08-02 (×7): 1 via ORAL
  Filled 2013-07-26 (×8): qty 1

## 2013-07-26 MED ORDER — ATORVASTATIN CALCIUM 40 MG PO TABS
40.0000 mg | ORAL_TABLET | Freq: Every day | ORAL | Status: DC
  Administered 2013-07-26 – 2013-07-28 (×3): 40 mg via ORAL
  Filled 2013-07-26 (×4): qty 1

## 2013-07-26 MED ORDER — GUAIFENESIN 100 MG/5ML PO SYRP
600.0000 mg | ORAL_SOLUTION | Freq: Two times a day (BID) | ORAL | Status: DC
  Filled 2013-07-26: qty 30

## 2013-07-26 MED ORDER — GUAIFENESIN ER 600 MG PO TB12
600.0000 mg | ORAL_TABLET | Freq: Two times a day (BID) | ORAL | Status: DC
  Administered 2013-07-26: 600 mg via ORAL
  Filled 2013-07-26 (×3): qty 1

## 2013-07-26 MED ORDER — ACETAMINOPHEN 325 MG PO TABS: 650.0000 mg | ORAL_TABLET | Freq: Four times a day (QID) | ORAL | Status: DC | PRN

## 2013-07-26 MED ORDER — LEVOFLOXACIN 750 MG PO TABS
750.0000 mg | ORAL_TABLET | Freq: Every day | ORAL | Status: DC
  Administered 2013-07-26: 750 mg via ORAL
  Filled 2013-07-26 (×2): qty 1

## 2013-07-26 MED ORDER — FUROSEMIDE 10 MG/ML IJ SOLN
INTRAMUSCULAR | Status: AC
  Filled 2013-07-26: qty 4

## 2013-07-26 MED ORDER — MIRTAZAPINE 15 MG PO TABS
15.0000 mg | ORAL_TABLET | Freq: Every day | ORAL | Status: DC
Start: 2013-07-26 — End: 2013-08-02
  Administered 2013-07-26 – 2013-08-01 (×7): 15 mg via ORAL
  Filled 2013-07-26 (×8): qty 1

## 2013-07-26 MED ORDER — FOLIC ACID 1 MG PO TABS
1.0000 mg | ORAL_TABLET | Freq: Every day | ORAL | Status: DC
  Filled 2013-07-26: qty 1

## 2013-07-26 MED ORDER — ACETAMINOPHEN 650 MG RE SUPP: 650.0000 mg | Freq: Four times a day (QID) | RECTAL | Status: DC | PRN

## 2013-07-26 MED ORDER — IPRATROPIUM-ALBUTEROL 0.5-2.5 (3) MG/3ML IN SOLN
3.0000 mL | RESPIRATORY_TRACT | Status: DC
Start: 2013-07-26 — End: 2013-07-26
  Administered 2013-07-26 (×3): 3 mL via RESPIRATORY_TRACT
  Filled 2013-07-26 (×3): qty 3

## 2013-07-26 MED ORDER — LACTULOSE 10 GM/15ML PO SOLN
20.0000 g | Freq: Every day | ORAL | Status: DC
  Administered 2013-07-26 – 2013-08-02 (×7): 20 g via ORAL
  Filled 2013-07-26 (×8): qty 30

## 2013-07-26 MED ORDER — HEPARIN SODIUM (PORCINE) 5000 UNIT/ML IJ SOLN
5000.0000 [IU] | Freq: Three times a day (TID) | INTRAMUSCULAR | Status: DC
  Administered 2013-07-26 – 2013-07-27 (×4): 5000 [IU] via SUBCUTANEOUS
  Filled 2013-07-26 (×7): qty 1

## 2013-07-26 MED ORDER — FOLIC ACID 1 MG PO TABS
1.0000 mg | ORAL_TABLET | Freq: Every day | ORAL | Status: DC
  Administered 2013-07-26 – 2013-08-02 (×7): 1 mg via ORAL
  Filled 2013-07-26 (×8): qty 1

## 2013-07-26 MED ORDER — ONDANSETRON HCL 4 MG PO TABS: 4.0000 mg | ORAL_TABLET | Freq: Four times a day (QID) | ORAL | Status: DC | PRN

## 2013-07-26 MED ORDER — ALBUTEROL SULFATE (2.5 MG/3ML) 0.083% IN NEBU: 2.5000 mg | INHALATION_SOLUTION | RESPIRATORY_TRACT | Status: DC | PRN

## 2013-07-26 MED ORDER — DULOXETINE HCL 60 MG PO CPEP
60.0000 mg | ORAL_CAPSULE | Freq: Every day | ORAL | Status: DC
  Administered 2013-07-26 – 2013-07-31 (×5): 60 mg via ORAL
  Filled 2013-07-26 (×6): qty 1

## 2013-07-26 MED ORDER — SODIUM CHLORIDE 0.9 % IJ SOLN
3.0000 mL | Freq: Two times a day (BID) | INTRAMUSCULAR | Status: DC
  Administered 2013-07-26 – 2013-08-02 (×8): 3 mL via INTRAVENOUS

## 2013-07-26 MED ORDER — FUROSEMIDE 10 MG/ML IJ SOLN
20.0000 mg | Freq: Once | INTRAMUSCULAR | Status: AC
  Administered 2013-07-26: 20 mg via INTRAVENOUS

## 2013-07-26 MED ORDER — POLYETHYLENE GLYCOL 3350 17 G PO PACK
17.0000 g | PACK | Freq: Every day | ORAL | Status: DC | PRN
  Filled 2013-07-26: qty 1

## 2013-07-26 MED ORDER — LORAZEPAM 2 MG/ML IJ SOLN: 1.0000 mg | Freq: Four times a day (QID) | INTRAMUSCULAR | Status: AC | PRN

## 2013-07-26 MED ORDER — FLUTICASONE PROPIONATE HFA 44 MCG/ACT IN AERO
1.0000 | INHALATION_SPRAY | Freq: Two times a day (BID) | RESPIRATORY_TRACT | Status: DC
Start: 2013-07-26 — End: 2013-08-01
  Administered 2013-07-26 – 2013-08-01 (×13): 1 via RESPIRATORY_TRACT
  Filled 2013-07-26: qty 10.6

## 2013-07-26 MED ORDER — VITAMIN B-1 100 MG PO TABS
100.0000 mg | ORAL_TABLET | Freq: Every day | ORAL | Status: DC
  Filled 2013-07-26: qty 1

## 2013-07-26 MED ORDER — VITAMIN B-1 100 MG PO TABS
100.0000 mg | ORAL_TABLET | Freq: Every day | ORAL | Status: DC
  Administered 2013-07-26 – 2013-08-01 (×6): 100 mg via ORAL
  Filled 2013-07-26 (×8): qty 1

## 2013-07-26 MED ORDER — THIAMINE HCL 100 MG/ML IJ SOLN
100.0000 mg | Freq: Every day | INTRAMUSCULAR | Status: DC
  Administered 2013-07-30 – 2013-08-02 (×2): 100 mg via INTRAVENOUS
  Filled 2013-07-26 (×8): qty 1

## 2013-07-26 MED ORDER — WARFARIN - PHARMACIST DOSING INPATIENT
Freq: Every day | Status: DC
  Administered 2013-07-26 – 2013-07-30 (×2)

## 2013-07-26 MED ORDER — CHLORHEXIDINE GLUCONATE 0.12 % MT SOLN
15.0000 mL | Freq: Two times a day (BID) | OROMUCOSAL | Status: DC
  Administered 2013-07-26 – 2013-07-30 (×10): 15 mL via OROMUCOSAL
  Filled 2013-07-26 (×13): qty 15

## 2013-07-26 MED ORDER — LORAZEPAM 1 MG PO TABS
1.0000 mg | ORAL_TABLET | Freq: Four times a day (QID) | ORAL | Status: AC | PRN
  Administered 2013-07-26: 1 mg via ORAL
  Filled 2013-07-26: qty 1

## 2013-07-26 MED ORDER — PREDNISONE 50 MG PO TABS
60.0000 mg | ORAL_TABLET | Freq: Every day | ORAL | Status: DC
  Administered 2013-07-26: 60 mg via ORAL
  Filled 2013-07-26 (×2): qty 1

## 2013-07-26 MED ORDER — WARFARIN SODIUM 2.5 MG PO TABS
2.5000 mg | ORAL_TABLET | Freq: Once | ORAL | Status: AC
  Administered 2013-07-26: 2.5 mg via ORAL
  Filled 2013-07-26: qty 1

## 2013-07-26 MED ORDER — ONDANSETRON HCL 4 MG/2ML IJ SOLN: 4.0000 mg | Freq: Four times a day (QID) | INTRAMUSCULAR | Status: DC | PRN

## 2013-07-26 MED ORDER — BIOTENE DRY MOUTH MT LIQD
15.0000 mL | Freq: Two times a day (BID) | OROMUCOSAL | Status: DC
  Administered 2013-07-26 – 2013-07-30 (×10): 15 mL via OROMUCOSAL

## 2013-07-26 MED ORDER — PALIPERIDONE ER 6 MG PO TB24
6.0000 mg | ORAL_TABLET | Freq: Every day | ORAL | Status: DC
  Administered 2013-07-26 – 2013-08-02 (×7): 6 mg via ORAL
  Filled 2013-07-26 (×8): qty 1

## 2013-07-26 MED ORDER — METOPROLOL TARTRATE 25 MG PO TABS
25.0000 mg | ORAL_TABLET | Freq: Two times a day (BID) | ORAL | Status: DC
  Administered 2013-07-26 – 2013-08-01 (×12): 25 mg via ORAL
  Filled 2013-07-26 (×16): qty 1

## 2013-07-26 NOTE — H&P (Signed)
[name] Admission History and Physical Service Pager: [phone]  Patient name: David [name] Medical record number: [id number] Date of birth: 03-Jun-1940 Age: 73 y.o. Gender: male  Primary Care Provider: PROVIDER NOT IN SYSTEM Consultants: Cards Code Status: DNR  Chief Complaint: Dyspnea  Assessment and Plan: David [name] is a 73 y.o. male presenting with acute dyspnea. PMH is significant for chronic systolic CHF (EF 89-38%; Echo 08/2012), Atrial flutter on coumadin, HTN, COPD w/ emphysema, CAD.   Acute Dyspnea -  Pt was recently admitted for COPD exacerbation and needed Bipap, discharged earlier this week. Patient was treated as a COPD exacerbation this visit  in the ED.  Improvement of symptoms with Duonebs and SoluMedrol seems more consistent with a COPD exacerbation, however,   the progressively worsening BNP is concerning for CHF exacerbation.  No fever. Mildly elevated WBC but patient did receive steroids. Chest Xray unremarkable. - Admit to SDU, attending Dr. Nori Riis - Supplemental O2 as needed  - Scheduled Duonebs Q4, albuterol Q2PRN - Prednisone 60 mg daily (starting 7/6); Methylprednisolone given in ED - Given severe COPD, will also treat with Levaquin 750mg  (pt recently treated during last admission as well) Uncertain of length or finished treatment course - Continuing home QVAR (flovent substituted)  - Does not appear clinically overloaded on exam, no alveolar edema on CXR but BNP is grossly elevated from priors. Patient hypotensive and spoke with Dr Claiborne Billings (cardiology) about diuresing patient. Will give IV Lasix 20 mg x 1 and reassess in the AM. - troponin neg x1 -CBC, CMP, and cortisol level in AM  -Echocardiogram ordered -Cardiology consulted  Atrial flutter: s/p ablation and not a candidate for further treatment. Pt was prescribed coumadin which was not therapeutic on admission. Concern he is not taking his medications at the ALF. - Will give  Metoprolol for rate control (when BP can handle) -No dilt in the setting of LV dysfunction - No amiodarone in tht setting of lung disease - Pt has a history of bradycardia on digoxin. - Goal HR 110 - Continue Warfarin (per pharm)  HTN: Currently hypotensive, will hold medications for now - Will continue to monitor closely and add back medications as needed.  - At last hospitalization, baseline SBPs in 100s.  Hx of Hypothyroidism - Not on synthroid currently - TSH from last admission was low at 0.237 however FT2 nl and FT4nl  Psych - Unclear diagnosis.  Patient appears to have baseline dementia. - Will continue home medications: Cymbalta, Remeron, Invega  CAD, HLD - Continue statin (lipitor substituted for crestor)  History of alcohol abuse Patient placed on CIWA protocol on previous admission. Had CIWA scores ranging from 0-14. -Will start CIWA protocol, discuss with team in AM  Hyperglycemia - Elevated on BMP - A1c 7.0  - Given acute illness, will hold treatment currently  FEN/GI: Dys-3, crushed meds with puree; NS IV lock  Prophylaxis: Coumadin Per Pharm  Disposition: Pending clinical improvement.  History of Present Illness:  David [name] is a 73 y.o. male with a PMH of chronic systolic CHF (EF 10-17%; Echo 08/2012), Atrial flutter on coumadin, HTN, COPD w/ emphysema, CAD who presents with SOB who was recently discharged on 07/22/13 for the same symptoms.   David [name] presents from an ALF with SOB.  Due to dementia, patient unable to answer most of my questions appropriately.  History obtained primarily through nursing staff and EMR. Patient is able to tell me he had a bad cold with coughing and nausea  over the last few days. He also endorsed emesis with eating but is able to keep solid food down. He tells me he feels better than when he was admitted on 07/17/13, but he has declined from when he was discharged. Currently, his breathing is "pretty fair," however, he later he  does endorses SOB.   Per EMR, SOB has gradually worsened over the last few days and significantly declined today. En route, he received a Duoneb treatment with improvement.  On EMS arrival to facility, patient was tachycardic to 112 and had a RR of 32. He received 2 additional Duoneb treatments and SoluMedrol 125mg  IV. Patient became hypotensive down to 87/54. He responded well to 500cc NS bolus in the ED. CXR shows no active disease.   Review Of Systems: Level 5 caveat. No fevers reported from [facility] care.  Patient Active Problem List   Diagnosis Date Noted  . Acute exacerbation of chronic obstructive pulmonary disease (COPD) 07/17/2013  . Protein-calorie malnutrition, severe 01/27/2013  . Acute on chronic systolic heart failure 43/15/4008  . Syphilis 12/31/2011  . Healthcare-associated pneumonia 12/24/2011  . Chronic systolic congestive heart failure, NYHA class 3 12/24/2011  . COPD with emphysema 12/24/2011  . Thrombocytopenia 12/24/2011  . Warfarin anticoagulation 12/24/2011  . Hypothyroidism 08/01/2011  . HTN (hypertension) 07/02/2011  . CAD (coronary artery disease) 04/11/2011  . Ischemic cardiomyopathy 04/11/2011  . Tobacco abuse 04/11/2011  . Atrial flutter 04/26/2010   Past Medical History: Past Medical History  Diagnosis Date  . Community acquired pneumonia     Right middle lobe community-acquired pneumonia  . Toxic metabolic encephalopathy   . Chronic atrial flutter     a. rate controlled with bb therapy;  b. h/o bradycardia when on digoxin 12/2011.  . Tobacco abuse   . History of coronary artery disease 2008    a. Cath at [facility]: RCA 95%, RI 60%  - medical therapy  . Ischemic cardiomyopathy     a. prev documented EF of 20-30%;  b. 09/2012 Echo: EF 40-45%, diff HK, mild MR, mild bi-atrial enlargement.  . History of prostate cancer   . Chronic obstructive pulmonary disease     with mild bronchospasm, improved  . Hypertension   . History of alcohol abuse      History of prior heavy alcohol abuse  . History of depression   . Altered mental status 2012   . Noncompliance   . Depression   . Ulcer   . Hx of migraines   . Hyperlipidemia   . Colon cancer    Past Surgical History: Past Surgical History  Procedure Laterality Date  . Appendectomy     Social History: History  Substance Use Topics  . Smoking status: Current Every Day Smoker -- 0.50 packs/day for 40 years    Types: Cigarettes  . Smokeless tobacco: Never Used  . Alcohol Use: No   Family History: Family History  Problem Relation Age of Onset  . Cancer Father     Prostate Cancer  . Heart disease Other     Parent  . Alcohol abuse Other     Parent  . Arthritis Other     parent  . Hypertension Other     Parent   Allergies and Medications: Allergies  Allergen Reactions  . Ace Inhibitors   . Oat Flour [Oat]     Unknown per MAR  . Other     Greens: unknown reaction per MAR   No current facility-administered medications on file prior  to encounter.   Current Outpatient Prescriptions on File Prior to Encounter  Medication Sig Dispense Refill  . albuterol (PROAIR HFA) 108 (90 BASE) MCG/ACT inhaler Inhale 2 puffs into the lungs every 6 (six) hours as needed for wheezing or shortness of breath.      . beclomethasone (QVAR) 40 MCG/ACT inhaler Inhale 2 puffs into the lungs 2 (two) times daily.      . DULoxetine (CYMBALTA) 60 MG capsule Take 60 mg by mouth daily.      . folic acid (FOLVITE) 1 MG tablet Take 1 tablet (1 mg total) by mouth daily.  30 tablet  1  . guaiFENesin (MUCINEX) 600 MG 12 hr tablet Take 1 tablet (600 mg total) by mouth 2 (two) times daily.      Marland Kitchen lactulose (CHRONULAC) 10 GM/15ML solution Take 20 g by mouth daily.      . metoprolol (LOPRESSOR) 50 MG tablet Take 1 tablet (50 mg total) by mouth 2 (two) times daily.  60 tablet  0  . mirtazapine (REMERON) 15 MG tablet Take 15 mg by mouth at bedtime.      . Multiple Vitamin (MULTIVITAMIN WITH MINERALS) TABS  tablet Take 1 tablet by mouth daily.      . paliperidone (INVEGA) 6 MG 24 hr tablet Take 6 mg by mouth daily.       . rosuvastatin (CRESTOR) 20 MG tablet Take 20 mg by mouth daily.      Marland Kitchen thiamine (VITAMIN B-1) 100 MG tablet Take 100 mg by mouth daily.      Marland Kitchen tiotropium (SPIRIVA) 18 MCG inhalation capsule Place 18 mcg into inhaler and inhale daily.      Marland Kitchen warfarin (COUMADIN) 5 MG tablet Take 5 mg by mouth every evening. 5pm        Objective: BP 102/78  Pulse 96  Temp(Src) 98.4 F (36.9 C) (Oral)  Resp 22  Ht 6' (1.829 m)  Wt 148 lb (67.132 kg)  BMI 20.07 kg/m2  SpO2 93% Exam: General: chronically ill appearing male, resting in bed in NAD.  HEENT: NCAT. MMM. Poor dentition. PERRLA. Cardiovascular: HR in the 90s. Irregular rhythm. No murmurs noted. Respiratory: Diffuse rhonchi and wheezing throughout all lung fields. O2 sats 97% on RA during my exam. Abdomen: soft, nontender, nondistended. Extremities: Trace LE edema bilaterally. No erythema or tenderness.  Skin: warm, dry, intact. Dry, scaly feet Neuro: Alert. Not oriented.  Could not recall year/month/president.  Follows commands.  No focal neurological deficits.   Labs: CBC BMET   Recent Labs Lab 07/25/13 2202  WBC 12.0*  HGB 13.7  HCT 41.6  PLT 175    Recent Labs Lab 07/25/13 2202  NA 135*  K 5.0  CL 97  CO2 22  BUN 29*  CREATININE 0.97  GLUCOSE 204*  CALCIUM 9.2     BNP (last 3 results)  Recent Labs  05/05/13 2215 07/17/13 1549 07/25/13 2202  PROBNP 1000.0* 5636.0* 21407.0*    Lab Results  Component Value Date   INR 1.33 07/22/2013   INR 1.51* 07/21/2013   INR 1.63* 07/20/2013  .  EKG - Atrial flutter (rate 114).   Imaging: Dg Chest Portable 1 View 07/17/2013   IMPRESSION: 1. Stable cardiomegaly and chronic interstitial changes without acute or superimposed abnormality.  Archie Patten, MD 07/26/2013, 1:20 AM PGY-1, [facility] Intern pager: [phone], text pages  welcome  I have examined and discussed the patient with intern and FPTS/attending. Alterations  to the  interns original  Note are made in blue.  Howard Pouch DO PGY-3 [facility]

## 2013-07-26 NOTE — ED Notes (Signed)
Report to [facility] on 3S.

## 2013-07-26 NOTE — Progress Notes (Signed)
Utilization review completed. Maram Bently, RN, BSN. 

## 2013-07-26 NOTE — Progress Notes (Signed)
PT Cancellation Note  Patient Details Name: [name] MRN: [id number] DOB: [birth year]   Cancelled Treatment:    Reason Eval/Treat Not Completed: Patient not medically ready (strict bedrest listed at this time) will see when activity orders updated   [name] 07/26/2013, 12:46 PM [name], [place] DPT  [phone]

## 2013-07-26 NOTE — Progress Notes (Addendum)
Monitor continues registering vtach - pt remains in aflutter with frequent PVCs - HR 130s consistently - CCMD contacted and acknowledged.  Radford Pax, MD and internal medicine resident notified.  Pt continues watching tv - no c/o pain or discomfort.  Pt reoriented and again educated of the importance of not getting out of bed - pt verbalizes understanding.  Bed alarm continued.  Will continue to closely monitor.

## 2013-07-26 NOTE — Progress Notes (Signed)
I received a call from [name] from [phone] but was unable to reach her when I called her back (voicemail not set up). I will try again tomorrow.  Vinie Sill, NP Palliative Medicine Team Pager # [phone] (M-F 8a-5p) Team Phone # [phone] (Nights/Weekends)

## 2013-07-26 NOTE — Consult Note (Signed)
Reason for Consult: atrial flutter, heart failure Referring Physician: Dr. Lorenso [name] Primary Cardiologist: Dr. Martin[place] Prospero [name] is an 73 y.o. male.  HPI: David [name] is a 73 yo man with PMH of CAD with LHC '08 with 95% RCA and 60% Ramus treated medically, ischemic cardiomyopathy with last known EF 40-45% '14, COPD and chronic ILD with chronic respiratory failure, atrial flutter (chronically) rate controlled on warfarin, hypothyroidism, prior heavy tobacco/alcohol use who lives in a nursing home and recently admitted for shortness of breath who is being treated for COPD exacerbation (steroids, antibiotics, nebulizers) with apparent improvement in condition. He has dementia and history is very challenging to obtain so most of history obtained from consulting team and chart review.        Past Medical History  Diagnosis Date  . Community acquired pneumonia     Right middle lobe community-acquired pneumonia  . Toxic metabolic encephalopathy   . Chronic atrial flutter     a. rate controlled with bb therapy;  b. h/o bradycardia when on digoxin 12/2011.  . Tobacco abuse   . History of coronary artery disease 2008    a. Cath at [facility]: RCA 95%, RI 60%  - medical therapy  . Ischemic cardiomyopathy     a. prev documented EF of 20-30%;  b. 09/2012 Echo: EF 40-45%, diff HK, mild MR, mild bi-atrial enlargement.  . History of prostate cancer   . Chronic obstructive pulmonary disease     with mild bronchospasm, improved  . Hypertension   . History of alcohol abuse     History of prior heavy alcohol abuse  . History of depression   . Altered mental status 2012   . Noncompliance   . Depression   . Ulcer   . Hx of migraines   . Hyperlipidemia   . Colon cancer     Past Surgical History  Procedure Laterality Date  . Appendectomy      Family History  Problem Relation Age of Onset  . Cancer Father     Prostate Cancer  . Heart disease Other     Parent  . Alcohol abuse Other     Parent  . Arthritis Other     parent  . Hypertension Other     Parent    Social History:  reports that he has been smoking Cigarettes.  He has a 20 pack-year smoking history. He has never used smokeless tobacco. He reports that he does not drink alcohol or use illicit drugs.  Allergies:  Allergies  Allergen Reactions  . Ace Inhibitors   . Oat Flour [Oat]     Unknown per MAR  . Other     Greens: unknown reaction per MAR    Medications:  I have reviewed the patient's current medications. Prior to Admission:  Prescriptions prior to admission  Medication Sig Dispense Refill  . albuterol (PROAIR HFA) 108 (90 BASE) MCG/ACT inhaler Inhale 2 puffs into the lungs every 6 (six) hours as needed for wheezing or shortness of breath.      . beclomethasone (QVAR) 40 MCG/ACT inhaler Inhale 2 puffs into the lungs 2 (two) times daily.      . DULoxetine (CYMBALTA) 60 MG capsule Take 60 mg by mouth daily.      . folic acid (FOLVITE) 1 MG tablet Take 1 tablet (1 mg total) by mouth daily.  30 tablet  1  . guaiFENesin (MUCINEX) 600 MG 12 hr tablet Take 1 tablet (600 mg total) by mouth  2 (two) times daily.      Marland Kitchen lactulose (CHRONULAC) 10 GM/15ML solution Take 20 g by mouth daily.      Marland Kitchen levofloxacin (LEVAQUIN) 750 MG tablet Take 750 mg by mouth daily.      . metoprolol (LOPRESSOR) 50 MG tablet Take 1 tablet (50 mg total) by mouth 2 (two) times daily.  60 tablet  0  . mirtazapine (REMERON) 15 MG tablet Take 15 mg by mouth at bedtime.      . Multiple Vitamin (MULTIVITAMIN WITH MINERALS) TABS tablet Take 1 tablet by mouth daily.      Marland Kitchen OVER THE COUNTER MEDICATION Take by mouth daily with breakfast. "Mighty Shake"      . paliperidone (INVEGA) 6 MG 24 hr tablet Take 6 mg by mouth daily.       . rosuvastatin (CRESTOR) 20 MG tablet Take 20 mg by mouth daily.      Marland Kitchen thiamine (VITAMIN B-1) 100 MG tablet Take 100 mg by mouth daily.      Marland Kitchen tiotropium (SPIRIVA) 18 MCG inhalation capsule Place 18 mcg into inhaler  and inhale daily.      Marland Kitchen warfarin (COUMADIN) 5 MG tablet Take 5 mg by mouth every evening. 5pm       Scheduled: . antiseptic oral rinse  15 mL Mouth Rinse q12n4p  . atorvastatin  40 mg Oral q1800  . chlorhexidine  15 mL Mouth Rinse BID  . DULoxetine  60 mg Oral Daily  . fluticasone  1 puff Inhalation BID  . folic acid  1 mg Oral Daily  . guaiFENesin  600 mg Oral BID  . heparin  5,000 Units Subcutaneous 3 times per day  . ipratropium-albuterol  3 mL Nebulization Q4H  . lactulose  20 g Oral Daily  . levofloxacin  750 mg Oral Daily  . mirtazapine  15 mg Oral QHS  . paliperidone  6 mg Oral Daily  . predniSONE  60 mg Oral Q breakfast  . sodium chloride  3 mL Intravenous Q12H  . thiamine  100 mg Oral Daily  . tiotropium  18 mcg Inhalation Daily  . Warfarin - Pharmacist Dosing Inpatient   Does not apply q1800   Continuous:   Results for orders placed during the hospital encounter of 07/25/13 (from the past 48 hour(s))  CBC     Status: Abnormal   Collection Time    07/25/13 10:02 PM      Result Value Ref Range   WBC 12.0 (*) 4.0 - 10.5 K/uL   RBC 4.58  4.22 - 5.81 MIL/uL   Hemoglobin 13.7  13.0 - 17.0 g/dL   HCT 41.6  39.0 - 52.0 %   MCV 90.8  78.0 - 100.0 fL   MCH 29.9  26.0 - 34.0 pg   MCHC 32.9  30.0 - 36.0 g/dL   RDW 15.4  11.5 - 15.5 %   Platelets 175  150 - 400 K/uL  COMPREHENSIVE METABOLIC PANEL     Status: Abnormal   Collection Time    07/25/13 10:02 PM      Result Value Ref Range   Sodium 135 (*) 137 - 147 mEq/L   Potassium 5.0  3.7 - 5.3 mEq/L   Chloride 97  96 - 112 mEq/L   CO2 22  19 - 32 mEq/L   Glucose, Bld 204 (*) 70 - 99 mg/dL   BUN 29 (*) 6 - 23 mg/dL   Creatinine, Ser 0.97  0.50 - 1.35 mg/dL  Calcium 9.2  8.4 - 10.5 mg/dL   Total Protein 6.5  6.0 - 8.3 g/dL   Albumin 3.0 (*) 3.5 - 5.2 g/dL   AST 79 (*) 0 - 37 U/L   ALT 92 (*) 0 - 53 U/L   Alkaline Phosphatase 105  39 - 117 U/L   Total Bilirubin 0.9  0.3 - 1.2 mg/dL   GFR calc non Af Amer 80 (*) >90  mL/min   GFR calc Af Amer >90  >90 mL/min   Comment: (NOTE)     The eGFR has been calculated using the CKD EPI equation.     This calculation has not been validated in all clinical situations.     eGFR's persistently <90 mL/min signify possible Chronic Kidney     Disease.   Anion gap 16 (*) 5 - 15  PRO B NATRIURETIC PEPTIDE     Status: Abnormal   Collection Time    07/25/13 10:02 PM      Result Value Ref Range   Pro B Natriuretic peptide (BNP) 21407.0 (*) 0 - 125 pg/mL  TROPONIN I     Status: None   Collection Time    07/25/13 10:02 PM      Result Value Ref Range   Troponin I <0.30  <0.30 ng/mL   Comment:            Due to the release kinetics of cTnI,     a negative result within the first hours     of the onset of symptoms does not rule out     myocardial infarction with certainty.     If myocardial infarction is still suspected,     repeat the test at appropriate intervals.  CBC     Status: Abnormal   Collection Time    07/26/13  5:11 AM      Result Value Ref Range   WBC 10.5  4.0 - 10.5 K/uL   RBC 4.30  4.22 - 5.81 MIL/uL   Hemoglobin 12.6 (*) 13.0 - 17.0 g/dL   HCT 39.1  39.0 - 52.0 %   MCV 90.9  78.0 - 100.0 fL   MCH 29.3  26.0 - 34.0 pg   MCHC 32.2  30.0 - 36.0 g/dL   RDW 15.6 (*) 11.5 - 15.5 %   Platelets 161  150 - 400 K/uL    Dg Chest Port 1 View  07/25/2013   CLINICAL DATA:  Shortness of breath.  EXAM: PORTABLE CHEST - 1 VIEW  COMPARISON:  07/21/2013  FINDINGS: The heart size and mediastinal contours are within normal limits. Both lungs are clear. The visualized skeletal structures are unremarkable.  IMPRESSION: No active disease.   Electronically Signed   By: Lucienne Capers M.D.   On: 07/25/2013 22:28    Review of Systems  Unable to perform ROS: dementia  Constitutional: Negative for fever and chills.   Blood pressure 109/66, pulse 117, temperature 97.8 F (36.6 C), temperature source Oral, resp. rate 29, height 6' 1"  (1.854 m), weight 65.1 kg (143 lb 8.3  oz), SpO2 98.00%. Physical Exam  Nursing note and vitals reviewed. Constitutional: No distress.  Thin man in NAD  HENT:  Head: Normocephalic and atraumatic.  Nose: Nose normal.  Mouth/Throat: Oropharynx is clear and moist. No oropharyngeal exudate.  Eyes: Conjunctivae and EOM are normal. Pupils are equal, round, and reactive to light. No scleral icterus.  Neck: Normal range of motion. Neck supple. JVD present. No tracheal deviation present.  jVP  3-4 cm above clavicle with slight HJR  Cardiovascular: Regular rhythm, normal heart sounds and intact distal pulses.  Exam reveals no gallop.   No murmur heard. Tachycardic, regular  Respiratory: Effort normal. No respiratory distress. He has rales.  Scattered rales at bases and rhonchi anteriorly  GI: Soft. Bowel sounds are normal. He exhibits no distension. There is no tenderness. There is no rebound.  Musculoskeletal: Normal range of motion. He exhibits no edema.  Neurological: He is alert. No cranial nerve deficit.  Alert, oriented to person  Skin: Skin is warm and dry. No rash noted. He is not diaphoretic. No erythema.  labs reviewed as above; K 5, cr 0.97, Trop <0.3, BNP 21k EKG: Atrial flutter with variable block  Problem List COPD exacerbation Atrial flutter LV Dysfunction/heart failure Known CAD Elevated BNP Dementia Chronic anticoagulation with warfarin Hypothyroidism  Assessment/Plan: Mr. Gaede is a 74 yo man with multiple medical problems. He is being treated for COPD exacerbation. His atrial flutter is chronic and HR stays at 90s-110s. He has contraindications to all rate control medications and amiodarone (ILD, significant COPD) with significant bradycardia previously so these medications have been avoided. He is being anticoagulated with warfarin and pharmacy continuing to manage. He initially had lower sbps 80s and elevated BNP so 20 mg IV lasix given ~ 5:00. On exam JVP just mildly up with clear chest x-ray so may not  need further lasix. Previous decisions to involved palliative care seem warranted. There is no medical therapy to rate control Mr. Wigley and rhythm control (DCCV) would not be durable given his chronic state of atrial flutter. For now, would treat COPD exacerbation and watch volume status closely.  - continue warfarin therapy per pharmacy - no rate control/rhythm control given significant bradycardia previously - assess UOP/response to IV lasix 20 mg x1; he may not need any further diuretics  - agree with COPD treatment as you are  David [name] 07/26/2013, 5:42 AM

## 2013-07-26 NOTE — Progress Notes (Signed)
ANTICOAGULATION CONSULT NOTE - Follow up [facility] for Coumadin Indication: atrial flutter  Allergies  Allergen Reactions  . Ace Inhibitors   . Oat Flour [Oat]     Unknown per MAR  . Other     Greens: unknown reaction per [facility]    Patient Measurements: Height: 6\' 1"  (185.4 cm) Weight: 143 lb 8.3 oz (65.1 kg) IBW/kg (Calculated) : 79.9  Vital Signs: Temp: 97.4 F (36.3 C) (07/06 0753) Temp src: Oral (07/06 0753) BP: 103/69 mmHg (07/06 1105) Pulse Rate: 133 (07/06 1105)  Labs:  Recent Labs  07/25/13 2202 07/26/13 0500 07/26/13 0511  HGB 13.7  --  12.6*  HCT 41.6  --  39.1  PLT 175  --  161  LABPROT  --  21.3*  --   INR  --  1.84*  --   CREATININE 0.97  --  1.03  TROPONINI <0.30  --   --     Estimated Creatinine Clearance: 58.8 ml/min (by C-G formula based on Cr of 1.03).   Medical History: Past Medical History  Diagnosis Date  . Community acquired pneumonia     Right middle lobe community-acquired pneumonia  . Toxic metabolic encephalopathy   . Chronic atrial flutter     a. rate controlled with bb therapy;  b. h/o bradycardia when on digoxin 12/2011.  . Tobacco abuse   . History of coronary artery disease 2008    a. Cath at [facility]: RCA 95%, RI 60%  - medical therapy  . Ischemic cardiomyopathy     a. prev documented EF of 20-30%;  b. 09/2012 Echo: EF 40-45%, diff HK, mild MR, mild bi-atrial enlargement.  . History of prostate cancer   . Chronic obstructive pulmonary disease     with mild bronchospasm, improved  . Hypertension   . History of alcohol abuse     History of prior heavy alcohol abuse  . History of depression   . Altered mental status 2012   . Noncompliance   . Depression   . Ulcer   . Hx of migraines   . Hyperlipidemia   . Colon cancer     Medications:  Prescriptions prior to admission  Medication Sig Dispense Refill  . albuterol (PROAIR HFA) 108 (90 BASE) MCG/ACT inhaler Inhale 2 puffs into the lungs every 6  (six) hours as needed for wheezing or shortness of breath.      . beclomethasone (QVAR) 40 MCG/ACT inhaler Inhale 2 puffs into the lungs 2 (two) times daily.      . DULoxetine (CYMBALTA) 60 MG capsule Take 60 mg by mouth daily.      . folic acid (FOLVITE) 1 MG tablet Take 1 tablet (1 mg total) by mouth daily.  30 tablet  1  . guaiFENesin (MUCINEX) 600 MG 12 hr tablet Take 1 tablet (600 mg total) by mouth 2 (two) times daily.      Marland Kitchen lactulose (CHRONULAC) 10 GM/15ML solution Take 20 g by mouth daily.      Marland Kitchen levofloxacin (LEVAQUIN) 750 MG tablet Take 750 mg by mouth daily.      . metoprolol (LOPRESSOR) 50 MG tablet Take 1 tablet (50 mg total) by mouth 2 (two) times daily.  60 tablet  0  . mirtazapine (REMERON) 15 MG tablet Take 15 mg by mouth at bedtime.      . Multiple Vitamin (MULTIVITAMIN WITH MINERALS) TABS tablet Take 1 tablet by mouth daily.      Marland Kitchen OVER THE COUNTER  MEDICATION Take by mouth daily with breakfast. "Mighty Shake"      . paliperidone (INVEGA) 6 MG 24 hr tablet Take 6 mg by mouth daily.       . rosuvastatin (CRESTOR) 20 MG tablet Take 20 mg by mouth daily.      Marland Kitchen thiamine (VITAMIN B-1) 100 MG tablet Take 100 mg by mouth daily.      Marland Kitchen tiotropium (SPIRIVA) 18 MCG inhalation capsule Place 18 mcg into inhaler and inhale daily.      Marland Kitchen warfarin (COUMADIN) 5 MG tablet Take 5 mg by mouth every evening. 5pm       Scheduled:  . antiseptic oral rinse  15 mL Mouth Rinse q12n4p  . atorvastatin  40 mg Oral q1800  . chlorhexidine  15 mL Mouth Rinse BID  . DULoxetine  60 mg Oral Daily  . fluticasone  1 puff Inhalation BID  . folic acid  1 mg Oral Daily  . guaiFENesin  600 mg Oral BID  . heparin  5,000 Units Subcutaneous 3 times per day  . ipratropium-albuterol  3 mL Nebulization Q4H  . lactulose  20 g Oral Daily  . metoprolol tartrate  25 mg Oral BID  . mirtazapine  15 mg Oral QHS  . multivitamin with minerals  1 tablet Oral Daily  . paliperidone  6 mg Oral Daily  . sodium chloride  3  mL Intravenous Q12H  . thiamine  100 mg Oral Daily   Or  . thiamine  100 mg Intravenous Daily  . tiotropium  18 mcg Inhalation Daily  . Warfarin - Pharmacist Dosing Inpatient   Does not apply q1800    Assessment: 73yo male c/o SOB x3d that became acutely worse PTA, rec'd neb by EMS which helped Sx, to continue Coumadin during admission for Aflutter; last Coumadin dose PTA 7/5. INR today has risen from 1.33>>1.84. Currently NPO.   Home Coumadin dose: 5 mg daily   Goal of Therapy:  INR 2-3   Plan:  1) Give reduced Coumadin dose of 2.5 mg x 1 dose given patient is NPO and rise in INR 2) Monitor daily PT/INR 3) Stop subq heparin when INR > 2.    Albertina Parr, PharmD.  Clinical Pharmacist Pager [phone]

## 2013-07-26 NOTE — Evaluation (Signed)
Physical Therapy Evaluation Patient Details Name: David [name] MRN: [id number] DOB: 07-May-1940 Today's Date: 07/26/2013   History of Present Illness  73 y/o male with PMH COPD, chronic A flutter and ischemic cardiomyopathy that present with worsening moderate SOB; COPD exacerbation.  Clinical Impression  Patient demonstrates deficits in functional mobility as indicated below. Will benefit from continued skilled PT to address deficits and maximize function. Recommend return to ALF upon acute discharge.     Follow Up Recommendations Supervision/Assistance - 24 hour (return to ALF with assist)    Equipment Recommendations       Recommendations for Other Services       Precautions / Restrictions Restrictions Weight Bearing Restrictions: No      Mobility  Bed Mobility Overal bed mobility: Needs Assistance Bed Mobility: Supine to Sit     Supine to sit: Min assist     General bed mobility comments: Assist to come to EOB, difficulty scooting out of bed, assist for hip roatation and max cues for intiation of scooting  Transfers Overall transfer level: Needs assistance Equipment used: 1 person hand held assist Transfers: Sit to/from Stand Sit to Stand: Min guard;Min assist         General transfer comment: Use of HHA to simulate cane, min assist for stability initially  Ambulation/Gait Ambulation/Gait assistance: Min assist Ambulation Distance (Feet): 90 Feet Assistive device: 1 person hand held assist Gait Pattern/deviations: Step-to pattern;Shuffle;Trunk flexed;Narrow base of support Gait velocity: decreased Gait velocity interpretation: Below normal speed for age/gender General Gait Details: very slow and cautious with gait, minimal clearance of stride  Stairs            Wheelchair Mobility    Modified Rankin (Stroke Patients Only)       Balance                                             Pertinent Vitals/Pain HR elevated 140s  during activity, 130s at rest    Home Living Family/patient expects to be discharged to:: Assisted living               Home Equipment: None Additional Comments: pt from [facility].  Spoke with staff member to get hx.      Prior Function Level of Independence: Needs assistance   Gait / Transfers Assistance Needed: No A needed.    ADL's / Homemaking Assistance Needed: Staff only needed to A for memory impairments, med management, and cooking/cleaning tasks.    Comments: pt reports using a cane     Hand Dominance   Dominant Hand: Right    Extremity/Trunk Assessment   Upper Extremity Assessment: Generalized weakness           Lower Extremity Assessment: Generalized weakness         Communication   Communication: No difficulties  Cognition Arousal/Alertness: Awake/alert Behavior During Therapy: WFL for tasks assessed/performed Overall Cognitive Status: History of cognitive impairments - at baseline                      General Comments General comments (skin integrity, edema, etc.):  (bilateral x 10)    Exercises General Exercises - Lower Extremity Ankle Circles/Pumps: AROM;Both;10 reps Long Arc Quad: AROM;Both;10 reps      Assessment/Plan    PT Assessment Patient needs continued PT services  PT Diagnosis Difficulty walking;Abnormality of  gait;Generalized weakness   PT Problem List Decreased strength;Decreased activity tolerance;Decreased balance;Decreased mobility  PT Treatment Interventions DME instruction;Gait training;Functional mobility training;Therapeutic activities;Therapeutic exercise;Balance training;Patient/family education   PT Goals (Current goals can be found in the Care Plan section) Acute Rehab PT Goals Patient Stated Goal: to [facility] to [facility] PT Goal Formulation: With patient Time For Goal Achievement: 08/09/13 Potential to Achieve Goals: Good    Frequency Min 3X/week   Barriers to discharge         Co-evaluation               End of Session Equipment Utilized During Treatment: Gait belt Activity Tolerance: Patient tolerated treatment well Patient left: in chair;with call bell/phone within reach Nurse Communication: Mobility status         Time: 1430-1449 PT Time Calculation (min): 19 min   Charges:   PT Evaluation $Initial PT Evaluation Tier I: 1 Procedure PT Treatments $Gait Training: 8-22 mins   PT G CodesDuncan Dull 07/26/2013, 3:17 PM [name], [place] DPT  [phone]

## 2013-07-26 NOTE — Progress Notes (Signed)
I have left a message for sister [name] to call back to discuss goals of care. I will await call back to set a time. Thank you for this consult.  Vinie Sill, NP Palliative Medicine Team Pager # [phone] (M-F 8a-5p) Team Phone # [phone] (Nights/Weekends)

## 2013-07-26 NOTE — Progress Notes (Signed)
Patient Name: Eugen Jeansonne Date of Encounter: 07/26/2013     Active Problems:   Dyspnea    SUBJECTIVE  Demented. Feeling okay. Not SOB  CURRENT MEDS . antiseptic oral rinse  15 mL Mouth Rinse q12n4p  . atorvastatin  40 mg Oral q1800  . chlorhexidine  15 mL Mouth Rinse BID  . DULoxetine  60 mg Oral Daily  . fluticasone  1 puff Inhalation BID  . folic acid  1 mg Oral Daily  . guaiFENesin  600 mg Oral BID  . heparin  5,000 Units Subcutaneous 3 times per day  . ipratropium-albuterol  3 mL Nebulization BID  . lactulose  20 g Oral Daily  . metoprolol tartrate  25 mg Oral BID  . mirtazapine  15 mg Oral QHS  . multivitamin with minerals  1 tablet Oral Daily  . paliperidone  6 mg Oral Daily  . sodium chloride  3 mL Intravenous Q12H  . thiamine  100 mg Oral Daily   Or  . thiamine  100 mg Intravenous Daily  . tiotropium  18 mcg Inhalation Daily  . warfarin  2.5 mg Oral ONCE-1800  . Warfarin - Pharmacist Dosing Inpatient   Does not apply q1800    OBJECTIVE  Filed Vitals:   07/26/13 1205 07/26/13 1504 07/26/13 1512 07/26/13 1515  BP:  82/65    Pulse:  117 103 78  Temp:  97.5 F (36.4 C) 98.3 F (36.8 C)   TempSrc:  Oral Oral   Resp:  26 23   Height:      Weight:      SpO2: 95% 94% 94%     Intake/Output Summary (Last 24 hours) at 07/26/13 1625 Last data filed at 07/26/13 1504  Gross per 24 hour  Intake      0 ml  Output   1150 ml  Net  -1150 ml   Filed Weights   07/25/13 2154 07/26/13 0355  Weight: 148 lb (67.132 kg) 143 lb 8.3 oz (65.1 kg)    PHYSICAL EXAM  General: Pleasant, NAD. Thin and frail Neuro: Alert and oriented X 3. Moves all extremities spontaneously. Psych: Normal affect. HEENT:  Normal  Neck: Supple without bruits or +JVD. Lungs:  Resp regular and unlabored, CTA. Scattered rales at bases and rhonchi anteriorly  Heart: tachycardic no s3, s4, or murmurs. Abdomen: Soft, non-tender, non-distended, BS + x 4.  Extremities: No clubbing, cyanosis  or edema. DP/PT/Radials 2+ and equal bilaterally.  Accessory Clinical Findings  CBC  Recent Labs  07/25/13 2202 07/26/13 0511  WBC 12.0* 10.5  HGB 13.7 12.6*  HCT 41.6 39.1  MCV 90.8 90.9  PLT 175 630   Basic Metabolic Panel  Recent Labs  07/25/13 2202 07/26/13 0511  NA 135* 138  K 5.0 4.7  CL 97 99  CO2 22 26  GLUCOSE 204* 184*  BUN 29* 28*  CREATININE 0.97 1.03  CALCIUM 9.2 8.8   Liver Function Tests  Recent Labs  07/25/13 2202 07/26/13 0511  AST 79* 63*  ALT 92* 91*  ALKPHOS 105 92  BILITOT 0.9 1.1  PROT 6.5 6.2  ALBUMIN 3.0* 3.0*    Cardiac Enzymes  Recent Labs  07/25/13 2202  TROPONINI <0.30    TELE  Atrial flutter with RVR, freq PVCs  Radiology/Studies  Dg Chest Port 1 View  07/25/2013   CLINICAL DATA:  Shortness of breath.  EXAM: PORTABLE CHEST - 1 VIEW  COMPARISON:  07/21/2013  FINDINGS: The heart size and mediastinal  contours are within normal limits. Both lungs are clear. The visualized skeletal structures are unremarkable.  IMPRESSION: No active disease.   Electronically Signed   By: Lucienne Capers M.D.   On: 07/25/2013 22:28   Dg Chest Port 1 View  07/21/2013   CLINICAL DATA:  Difficulty breathing  EXAM: PORTABLE CHEST - 1 VIEW  COMPARISON:  July 17, 2013  FINDINGS: There is no edema or consolidation. Heart is borderline enlarged with normal pulmonary vascularity. No adenopathy. No pneumothorax. No bone lesions.  IMPRESSION: No edema or consolidation.  Heart borderline enlarged but stable.   Electronically Signed   By: Lowella Grip M.D.   On: 07/21/2013 06:58   Study Date: 07/26/2013- LV EF: 15% Study Conclusions - Left ventricle: Prominant apical trabeculations and false band. The cavity size was moderately dilated. Wall thickness was normal. The estimated ejection fraction was 15%. Diffuse hypokinesis. The study is not technically sufficient to allow evaluation of LV diastolic function. - Atrial septum: No defect or patent  foramen ovale was identified. - Pulmonary arteries: PA peak pressure: 34 mm Hg (S).    ASSESSMENT AND PLAN Mr. Pollack is a 73 yo man with PMH of CAD with LHC '08 with 95% RCA and 60% Ramus treated medically, ischemic cardiomyopathy with last known EF 40-45% '14, COPD and chronic ILD with chronic respiratory failure, atrial flutter (chronically) on warfarin, hypothyroidism, prior heavy tobacco/alcohol use who lives in a nursing home and recently admitted for shortness of breath who was admitted yesterday with acute dyspnea. ECHO on this admission with EF 15%.  Burak Zerbe is a 73 y.o. male with a history of CAD s/p LHC RCA 95%, RI 60% - rx therapy, COPD, ischemic cardiomyopathy (EF 40-45% ECHO 2014), HTN, hypothyroidism, chronic atrial flutter on coumadin, and heavy alcohol use/tobacco abuse who was brought in from the nursing home to [facility] on 07/26/13 for acute dyspnea.He is being treated for an acute COPD exacerbation in the setting of chronic ILD and chronic respiratory failure as well as acute CHF. He is chronically in atrial flutter, but had had some RVR and cardiology has been consulted for help in rate control.   Chronic atrial flutter. He had cardioversion in 2008 but has since had recurrence.  -- 09/2012 hospitalization he had RVR. He was initially digitalized but this was later stopped because of history of severe bradycardia on this medication.  -- Controlled as an outpatient on metoprolol 25mg  mg BID only. Continued on this: cannot up-titrate due to hypotension -- Continue coumadin. Currently subtheraputic. Per pharmacy.  -- Currently HR 110-130 bmp. Would avoid dilt in the setting of LV dysfunction and amiodarone with lung disease. Would just continue to monitor at this point with patient poor functional status and hypotension. Palliative care consulted. -- It doesn't look like he has ever been seen by EP for evaluation of a pacemaker. This may be due to his poor overall clinical  status  Chronic systolic CHF/ischemic CM- ProBNP of 21K plus. Chest xray clear. Given IV Lasix: neg 1.2L, weight down 5lbs.  -- Echo on this admission with EF 15%, mod LA dilation and diffuse hypokinesis. This is significantly decreased from 09/20/12 EF 40 to 45%, mild MR.  -- Historically blood pressure too low to consider ACE inhibitor or ARB.  -- Continue BB.   Acute COPD exacerbation  -- Continue tx per IM  History of alcohol abuse. Possibly withdrawing.  -- Agree with CIWA protocol  -- Monitor for DTs.   CAD- Cath at  [facility]: RCA 95%, RI 60% - medical therapy  -- No objective signs of ischemia. No chest pain.  -- Continue statin, BB. Not on ASA due to coumadin use.   HTN  -- BP's currently low, SBPs in 80s -- Will continue to monitor closely   [place], [place] PA-C  Pager [phone]

## 2013-07-26 NOTE — ED Provider Notes (Signed)
I saw and evaluated the patient, reviewed the resident's note and I agree with the findings and plan.   EKG Interpretation None       Patient with dyspnea, c/w bronchospasm. Improved with nebs but still dyspneic and has borderline BPs. BP mostly seems positional. Will need admission but stable  Ephraim Hamburger, MD 07/26/13 1231

## 2013-07-26 NOTE — Care Management Note (Addendum)
    Page 1 of 1   08/02/2013     2:14:51 PM CARE MANAGEMENT NOTE 08/02/2013  Patient:  David [name], David [name]   Account Number:  [id number]  Date Initiated:  07/26/2013  Documentation initiated by:  GRAVES-BIGELOW,BRENDA  Subjective/Objective Assessment:   Pt admitted for SOB. Pt is from [place]. Plan to return once stable.     Action/Plan:   CM did place a call to [name] and was able to contact her with one phone call. CM did place page to MD to make them aware to call her now at 1233 pm. CSW to assist with disposition needs.   Anticipated DC Date:  08/02/2013   Anticipated DC Plan:  SKILLED NURSING FACILITY  In-house referral  Clinical Social Worker      DC Planning Services  CM consult      Choice offered to / List presented to:             Status of service:  Completed, signed off Medicare Important Message given?  YES (If response is "NO", the following Medicare IM given date fields will be blank) Date Medicare IM given:  07/28/2013 Medicare IM given by:  GRAVES-BIGELOW,BRENDA Date Additional Medicare IM given:  08/02/2013 Additional Medicare IM given by:  Marvetta Gibbons  Discharge Disposition:  [place]  Per UR Regulation:  Reviewed for med. necessity/level of care/duration of stay  If discussed at [place] of Stay Meetings, dates discussed:    Comments:  08/02/13- 106- Marvetta Gibbons RN, BSN [phone] Pt's daughter at bedside along with other family members (brother/sisters) have met with PC and have agreed to d/c to SNF with PC following - CSW working with family to d/c to [facility]- family would like pt to tx closer to them towards [place]- GL to assist family with this - Medicare IM given to family at bedside.

## 2013-07-26 NOTE — Progress Notes (Addendum)
Cardiology contacted requesting information regarding possible procedure vs re-ordering pt a diet since pt has been NPO since 0550 this AM.  Also notified of pt's decrease in HR to one-teens, afib and decrease in BP.  Pt remains up in chair, watching tv - pleasant, no c/o pain or discomfort.  Will continue to closely monitor.

## 2013-07-26 NOTE — Progress Notes (Signed)
Echocardiogram 2D Echocardiogram has been performed.  David [name] 07/26/2013, 12:56 PM

## 2013-07-26 NOTE — Progress Notes (Signed)
ANTICOAGULATION CONSULT NOTE - Initial Consult  Pharmacy Consult for Coumadin Indication: atrial flutter  Allergies  Allergen Reactions  . Ace Inhibitors   . Oat Flour [Oat]     Unknown per MAR  . Other     Greens: unknown reaction per [facility]    Patient Measurements: Height: 6\' 1"  (185.4 cm) Weight: 143 lb 8.3 oz (65.1 kg) IBW/kg (Calculated) : 79.9  Vital Signs: Temp: 97.8 F (36.6 C) (07/06 0355) Temp src: Oral (07/06 0355) BP: 109/66 mmHg (07/06 0355) Pulse Rate: 117 (07/06 0355)  Labs:  Recent Labs  07/25/13 2202  HGB 13.7  HCT 41.6  PLT 175  CREATININE 0.97  TROPONINI <0.30    Estimated Creatinine Clearance: 62.5 ml/min (by C-G formula based on Cr of 0.97).   Medical History: Past Medical History  Diagnosis Date  . Community acquired pneumonia     Right middle lobe community-acquired pneumonia  . Toxic metabolic encephalopathy   . Chronic atrial flutter     a. rate controlled with bb therapy;  b. h/o bradycardia when on digoxin 12/2011.  . Tobacco abuse   . History of coronary artery disease 2008    a. Cath at [facility]: RCA 95%, RI 60%  - medical therapy  . Ischemic cardiomyopathy     a. prev documented EF of 20-30%;  b. 09/2012 Echo: EF 40-45%, diff HK, mild MR, mild bi-atrial enlargement.  . History of prostate cancer   . Chronic obstructive pulmonary disease     with mild bronchospasm, improved  . Hypertension   . History of alcohol abuse     History of prior heavy alcohol abuse  . History of depression   . Altered mental status 2012   . Noncompliance   . Depression   . Ulcer   . Hx of migraines   . Hyperlipidemia   . Colon cancer     Medications:  Prescriptions prior to admission  Medication Sig Dispense Refill  . albuterol (PROAIR HFA) 108 (90 BASE) MCG/ACT inhaler Inhale 2 puffs into the lungs every 6 (six) hours as needed for wheezing or shortness of breath.      . beclomethasone (QVAR) 40 MCG/ACT inhaler Inhale 2 puffs into the  lungs 2 (two) times daily.      . DULoxetine (CYMBALTA) 60 MG capsule Take 60 mg by mouth daily.      . folic acid (FOLVITE) 1 MG tablet Take 1 tablet (1 mg total) by mouth daily.  30 tablet  1  . guaiFENesin (MUCINEX) 600 MG 12 hr tablet Take 1 tablet (600 mg total) by mouth 2 (two) times daily.      Marland Kitchen lactulose (CHRONULAC) 10 GM/15ML solution Take 20 g by mouth daily.      Marland Kitchen levofloxacin (LEVAQUIN) 750 MG tablet Take 750 mg by mouth daily.      . metoprolol (LOPRESSOR) 50 MG tablet Take 1 tablet (50 mg total) by mouth 2 (two) times daily.  60 tablet  0  . mirtazapine (REMERON) 15 MG tablet Take 15 mg by mouth at bedtime.      . Multiple Vitamin (MULTIVITAMIN WITH MINERALS) TABS tablet Take 1 tablet by mouth daily.      Marland Kitchen OVER THE COUNTER MEDICATION Take by mouth daily with breakfast. "Mighty Shake"      . paliperidone (INVEGA) 6 MG 24 hr tablet Take 6 mg by mouth daily.       . rosuvastatin (CRESTOR) 20 MG tablet Take 20 mg by mouth  daily.      . thiamine (VITAMIN B-1) 100 MG tablet Take 100 mg by mouth daily.      Marland Kitchen tiotropium (SPIRIVA) 18 MCG inhalation capsule Place 18 mcg into inhaler and inhale daily.      Marland Kitchen warfarin (COUMADIN) 5 MG tablet Take 5 mg by mouth every evening. 5pm       Scheduled:  . atorvastatin  40 mg Oral q1800  . DULoxetine  60 mg Oral Daily  . fluticasone  1 puff Inhalation BID  . folic acid  1 mg Oral Daily  . furosemide  20 mg Intravenous Once  . guaiFENesin  600 mg Oral BID  . heparin  5,000 Units Subcutaneous 3 times per day  . ipratropium-albuterol  3 mL Nebulization Q4H  . lactulose  20 g Oral Daily  . levofloxacin  750 mg Oral Daily  . mirtazapine  15 mg Oral QHS  . paliperidone  6 mg Oral Daily  . predniSONE  60 mg Oral Q breakfast  . sodium chloride  3 mL Intravenous Q12H  . thiamine  100 mg Oral Daily  . tiotropium  18 mcg Inhalation Daily  . Warfarin - Pharmacist Dosing Inpatient   Does not apply q1800    Assessment: 73yo male c/o SOB x3d that  became acutely worse today, rec'd neb by EMS which helped Sx, to continue Coumadin during admission for Aflutter; last Coumadin dose PTA 7/5.  Goal of Therapy:  INR 2-3   Plan:  Will obtain current INR prior to dosing (was 1.3 just 3d ago).  Wynona Neat, PharmD, BCPS  07/26/2013,4:24 AM

## 2013-07-26 NOTE — H&P (Signed)
Call Pager [phone] for any questions or notifications regarding this patient  FMTS Attending Admission Note: Dorcas Mcmurray MD Attending pager:[phone] I  have seen and examined this patient, reviewed their chart. I have discussed this patient with the resident. I agree with the resident's findings, assessment and care plan. His exam is pretty normal--he continues to have some rhonchorous sounds in left chest but baseline. He is not using oxygen, he is totally comfortable at rest. He is tachycardic (also his baseline). He is moderately demented (baseline). Only new finding is his mildly increased BNP (previously 5000 now 20000) but his CXR is not impressive as far as pulmonary edema, he has no LE edema, no sacral edema. I am not sure why he came back in (and he cannot articulate). I have reviewed the ED notes. I doubt getting an ECHo would change treatment plan, but the overnight team has ordered it. I do not feel strongly either way and wil defer to the new team taking over this morning, (Dr. Ree Kida is new attending and I have spoken with him). They started some diuresis but it is unclear to me that we are treating anything other than an elevated BNP.

## 2013-07-27 DIAGNOSIS — I5022 Chronic systolic (congestive) heart failure: Secondary | ICD-10-CM

## 2013-07-27 DIAGNOSIS — I509 Heart failure, unspecified: Secondary | ICD-10-CM

## 2013-07-27 DIAGNOSIS — E43 Unspecified severe protein-calorie malnutrition: Secondary | ICD-10-CM

## 2013-07-27 DIAGNOSIS — I10 Essential (primary) hypertension: Secondary | ICD-10-CM

## 2013-07-27 LAB — CBC
HCT: 40.5 % (ref 39.0–52.0)
HEMOGLOBIN: 13.4 g/dL (ref 13.0–17.0)
MCH: 29.7 pg (ref 26.0–34.0)
MCHC: 33.1 g/dL (ref 30.0–36.0)
MCV: 89.8 fL (ref 78.0–100.0)
PLATELETS: 168 10*3/uL (ref 150–400)
RBC: 4.51 MIL/uL (ref 4.22–5.81)
RDW: 15.6 % — ABNORMAL HIGH (ref 11.5–15.5)
WBC: 15.7 10*3/uL — ABNORMAL HIGH (ref 4.0–10.5)

## 2013-07-27 LAB — PROTIME-INR
INR: 2.64 — AB (ref 0.00–1.49)
PROTHROMBIN TIME: 28.2 s — AB (ref 11.6–15.2)

## 2013-07-27 LAB — HEPATIC FUNCTION PANEL
ALBUMIN: 3 g/dL — AB (ref 3.5–5.2)
ALT: 95 U/L — ABNORMAL HIGH (ref 0–53)
AST: 56 U/L — ABNORMAL HIGH (ref 0–37)
Alkaline Phosphatase: 87 U/L (ref 39–117)
Bilirubin, Direct: 0.4 mg/dL — ABNORMAL HIGH (ref 0.0–0.3)
Indirect Bilirubin: 0.7 mg/dL (ref 0.3–0.9)
TOTAL PROTEIN: 6.3 g/dL (ref 6.0–8.3)
Total Bilirubin: 1.1 mg/dL (ref 0.3–1.2)

## 2013-07-27 LAB — BASIC METABOLIC PANEL
ANION GAP: 17 — AB (ref 5–15)
BUN: 32 mg/dL — ABNORMAL HIGH (ref 6–23)
CO2: 22 mEq/L (ref 19–32)
Calcium: 9.2 mg/dL (ref 8.4–10.5)
Chloride: 97 mEq/L (ref 96–112)
Creatinine, Ser: 1.05 mg/dL (ref 0.50–1.35)
GFR calc Af Amer: 79 mL/min — ABNORMAL LOW (ref >90)
GFR, EST NON AFRICAN AMERICAN: 68 mL/min — AB (ref >90)
Glucose, Bld: 191 mg/dL — ABNORMAL HIGH (ref 70–99)
POTASSIUM: 5.1 meq/L (ref 3.7–5.3)
SODIUM: 136 meq/L — AB (ref 137–147)

## 2013-07-27 MED ORDER — WARFARIN SODIUM 1 MG PO TABS
1.0000 mg | ORAL_TABLET | Freq: Once | ORAL | Status: AC
  Administered 2013-07-27: 1 mg via ORAL
  Filled 2013-07-27: qty 1

## 2013-07-27 NOTE — Progress Notes (Signed)
FMTS Attending Note  I personally saw and evaluated the patient. The plan of care was discussed with the resident team. I agree with the assessment and plan as documented by the resident.   1. 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 COPD - patient's dyspnea unlikely due to COPD exacerbation, continue home breathing treatments and supplemental O2 as needed 3. Chronic Atrial Flutter - see resident note, no improvement of tachycardia with addition of Metoprolol 25 mg BID yesterday, BP can not tolerate additional antihypertensive medications, appreciate cardiology input 4. HTN - currently hypotensive likely due to severe systolic CHF, increase Metoprolol as BP allows 5. CAD - agree with continuation of home statin and BB 6. Encephalopathy - patient does not communicate well, oriented to self, unclear if history of dementia, continue to monitor  Dossie Arbour MD

## 2013-07-27 NOTE — Clinical Social Work Note (Signed)
Message left with contact listed [name]). CSW is familiar with patient from previous admission and notes that it was difficult to get in touch with [name] during prior admission. CSW notes that PT has recommended patient return to ALF at discharge, so CSW will proceed with return to ALF when appropriate. CSW left message for [name].    Update3:17PM: CSW attempted to reach [place] by phone again, without success. Second message left explaining that MD has recommended SNF placement for patient.  Liz Beach MSW, [place], [place], [phone]

## 2013-07-27 NOTE — Discharge Summary (Signed)
[name] Discharge Summary  Patient name: David [name] Medical record number: [id number] Date of birth: 06/20/1940 Age: 73 y.o. Gender: male Date of Admission: 07/25/2013  Date of Discharge: 08/02/2013  Admitting Physician: Dickie La, MD  Primary Care Provider: PROVIDER NOT IN SYSTEM Consultants: Cardiology, HF team, Palliative Care  Indication for Hospitalization: Acute dyspnea  Discharge Diagnoses/Problem List: Chronic systolic CHF/ischemic CM Acute COPD exacerbation Chronic Atrial flutter HTN Hyperkalemia H/o hypothyroidism CAD HLD H/o alcohol abuse Unknown psych diagnosis Hyperglycemia Transaminitis likely 2/2 Shock Liver Aspiration PNA  Disposition: SNF with palliative care to follow  Discharge Condition: Stable  Discharge Exam:  Filed Vitals:   08/02/13 1400  BP: 80/57  Pulse:   Temp:   Resp: 26  General: Pleasant, demented, elderly man in NAD. Thin and frail.  HEENT: Pupils dilated, but reactive. No nystagmus  Heart: tachycardic no murmurs.  Lungs: Normal WOB. Rhonchi bilaterally  Abdomen: Soft, non-tender, non-distended, BS +  Extremities: No clubbing, cyanosis or edema.   Brief Hospital Course:  David [name] is a 73 y.o. male who was admitted for acute dyspnea.  PMH is significant for chronic systolic CHF (EF 78-29%; Echo 08/2012), Atrial flutter on coumadin, HTN, COPD w/ emphysema, CAD.  He was recently discharged back to ALF on 7/2 after hospitalization for acute dyspnea 2/2 COPD exacerbation.    Chronic systolic CHF/ischemic CM, Acute COPD exacerbation:  CXR was clear, but showed clinical improvement in dyspnea with duonebs.  Was started on treatment for COPD exacerbation in ED (Levaquin and Prednisone).  His BNP was found to be 21K on admission, even though he appeared clinically euvolemic.  He was given IV Lasix 20mg  x1.  Cardiology was consulted for BNP and chronic a flutter with hypotension.  His Echo (7/6) showed a  reduced EF of 15%, down from 40-45% in 08/2012, mod LA dilation and diffuse hypokinesis.  Prednisone and Levaquin were d/c'd 7/6, as respiratory status was stable on RA.  Continued Metoprolol 25mg  BID.  Chronic atrial flutter. s/p ablation and cardioversion in 2008 and not a candidate for further treatment. Goal HR 110 (remained in 130s during admission). He was restarted on Metoprolol 25mg  BID (which was increased to 50mg  BID on last admission but BP could not tolerate higher dose).  Not a candidate for rate control due to contraindications to amio (ILD), dilt (LV dysfxn), and digoxin (h/o bradycardia with use).  Home coumadin was initially continued per pharmacy. D/c'd coumadin 7/13, as risks outweigh benefits in this demented elderly man with poor overall health status.  Started aspirin.  RLL Aspiration Pneumonia: Patient was noted to have elevated WBC count of 15.5. Was initially thought to be marginalization from recent steroid use.  When leukocytosis persisted after several days off Prednisone, CXR showed R base infiltrate. Likely related to aspiration in this demented elderly man. Zosyn and Vanc started, made NPO, and speech eval. Speech eval showed good swallowing when coached, recommended dys-1 diet with thin liquids with the caveat that he is still at an increased risk of aspiration 2/2 mental status.  Switched to PO Augmentin 7/11,after showing clinical improvement.  HTN: Hypotensive with SBPs 80s-110 throughout admission. At last hospitalization, baseline SBPs in 100s.  MEtoprolol was continued as noted under A flutter.    Transaminitis, 2/2 shock liver: AST and ALT mildly elevated on admission.  No h/o Liver disease.  AST and ALT quickly rose during admission to peak of 787 and 1075, respectively.  This was thought to be due  to shock liver, as patient was persistently hypotensive.   Hyperkalemia: K up to 6.2 at peak.  Monitored with BMETs. Improved on discharge.   Hx of Hypothyroidism : Not  on synthroid prior to admission.  TSH from last admission was low at 0.237 however FT3 nl and FT4nl.  Psych - Unclear diagnosis. Patient appears to have baseline dementia. Home medications: Cymbalta, Remeron, Invega continued during admission.  CAD, HLD : Stable.  Home statin (lipitor substituted for crestor), BB continued.   History of alcohol abuse Patient placed on CIWA protocol on previous admission. Had CIWA scores ranging from 0-14. Was monitored on CIWA protocol throughout admission, but did not score.   Hyperglycemia : Elevated on BMP, A1c 7.0.  Given acute illness, held treatment.   Palliative care was consulted on admission to have goals of care discussion with family who had been difficult to contact on previous admission.  This was especially important as patient was noted to have significantly worsened CHF and shock liver.  Family could not be contacted for days, so Ethics c/s was placed.  Ethics team said that if POA could not be reached and due diligence had been done, could make medical decisions in his best interest.  Family meeting with 2 daughters 7/13, decided SNF with palliative care to follow.  Issues for Follow Up:  1. Symptom management   Significant Procedures: None  Significant Labs and Imaging:   Recent Labs Lab 07/31/13 0230 08/01/13 0315 08/02/13 0310  WBC 21.5* 21.5* 18.8*  HGB 13.0 12.8* 12.2*  HCT 40.0 38.4* 38.3*  PLT 133* 112* 104*    Recent Labs Lab 07/29/13 0303 07/30/13 0354 07/31/13 0230 08/01/13 0315 08/02/13 0310  NA 137 138 142 143 143  K 5.6* 5.8* 5.2 5.2 4.9  CL 99 100 101 102 102  CO2 24 24 24 24 23   GLUCOSE 156* 160* 219* 144* 170*  BUN 43* 50* 61* 69* 83*  CREATININE 1.18 1.19 1.50* 1.57* 1.81*  CALCIUM 9.1 9.1 9.1 9.0 8.8  ALKPHOS 181* 206* 224* 214* 212*  AST 337* 354* 623* 878* 787*  ALT 400* 511* 781* 1026* 1075*  ALBUMIN 3.0* 3.0* 2.9* 2.8* 2.6*    AM Cortisol 38.8 (7/6)  Ammonia 37  CXR (7/5): The heart size  and mediastinal contours are within normal limits. Both lungs are clear. The visualized skeletal structures are unremarkable. IMPRESSION: No active disease.   Echo (7/6)  Study Conclusions - Left ventricle: Prominant apical trabeculations and false band. The cavity size was moderately dilated. Wall thickness was normal. The estimated ejection fraction was 15%. Diffuse hypokinesis. The study is not technically sufficient to allow evaluation of LV diastolic function. - Atrial septum: No defect or patent foramen ovale was identified. - Pulmonary arteries: PA peak pressure: 34 mm Hg (S).   CXR (7/9):  Evidence of right base infiltrate. Lungs otherwise clear. Stable  cardiac enlargement.   Results/Tests Pending at Time of Discharge: None  Discharge Medications:    Medication List    STOP taking these medications       levofloxacin 750 MG tablet  Commonly known as:  LEVAQUIN     OVER THE COUNTER MEDICATION     PROAIR HFA 108 (90 BASE) MCG/ACT inhaler  Generic drug:  albuterol     warfarin 5 MG tablet  Commonly known as:  COUMADIN      TAKE these medications       amoxicillin-clavulanate 875-125 MG per tablet  Commonly known as:  AUGMENTIN  Take 1 tablet by mouth every 12 (twelve) hours.     aspirin 81 MG EC tablet  Take 1 tablet (81 mg total) by mouth daily.     beclomethasone 40 MCG/ACT inhaler  Commonly known as:  QVAR  Inhale 2 puffs into the lungs 2 (two) times daily.     DULoxetine 60 MG capsule  Commonly known as:  CYMBALTA  Take 60 mg by mouth daily.     folic acid 1 MG tablet  Commonly known as:  FOLVITE  Take 1 tablet (1 mg total) by mouth daily.     guaiFENesin 600 MG 12 hr tablet  Commonly known as:  MUCINEX  Take 1 tablet (600 mg total) by mouth 2 (two) times daily.     lactulose 10 GM/15ML solution  Commonly known as:  CHRONULAC  Take 20 g by mouth daily.     metoprolol tartrate 25 MG tablet  Commonly known as:  LOPRESSOR  Take 1 tablet (25  mg total) by mouth 2 (two) times daily.     mirtazapine 15 MG tablet  Commonly known as:  REMERON  Take 15 mg by mouth at bedtime.     multivitamin with minerals Tabs tablet  Take 1 tablet by mouth daily.     paliperidone 6 MG 24 hr tablet  Commonly known as:  INVEGA  Take 6 mg by mouth daily.     rosuvastatin 20 MG tablet  Commonly known as:  CRESTOR  Take 20 mg by mouth daily.     thiamine 100 MG tablet  Commonly known as:  VITAMIN B-1  Take 100 mg by mouth daily.     tiotropium 18 MCG inhalation capsule  Commonly known as:  SPIRIVA  Place 18 mcg into inhaler and inhale daily.        Discharge Instructions: Please refer to Patient Instructions section of EMR for full details.  Patient was counseled important signs and symptoms that should prompt return to medical care, changes in medications, dietary instructions, activity restrictions, and follow up appointments.   Follow-Up Appointments:   [name], MD 08/02/2013, 2:28 PM PGY-1, [facility]

## 2013-07-27 NOTE — Progress Notes (Signed)
Physical Therapy Treatment Patient Details Name: David [name] MRN: [id number] DOB: 10/28/1940 Today's Date: 07/27/2013    History of Present Illness 73 y/o male with PMH COPD, chronic A flutter and ischemic cardiomyopathy that present with worsening moderate SOB; COPD exacerbation.    PT Comments    Patient with some confusion this am, ambulated with min assist and tolerated there ex well. Patient states that he is tired and preferred to get back to bed. Assisted patient with positioning in bed. Will follow as indicated.  Follow Up Recommendations  Supervision/Assistance - 24 hour (return to ALF with assist)     Equipment Recommendations       Recommendations for Other Services       Precautions / Restrictions Restrictions Weight Bearing Restrictions: No    Mobility  Bed Mobility Overal bed mobility: Needs Assistance Bed Mobility: Supine to Sit     Supine to sit: Min assist     General bed mobility comments: Assist to come to EOB, difficulty scooting out of bed, assist for hip roatation and max cues for intiation of scooting  Transfers Overall transfer level: Needs assistance Equipment used: 1 person hand held assist   Sit to Stand: Min guard;Min assist         General transfer comment: Use of HHA to simulate cane, min assist for stability initially  Ambulation/Gait Ambulation/Gait assistance: Min assist Ambulation Distance (Feet): 210 Feet Assistive device: 1 person hand held assist Gait Pattern/deviations: Step-to pattern;Shuffle;Trunk flexed;Narrow base of support Gait velocity: decreased Gait velocity interpretation: Below normal speed for age/gender General Gait Details: very slow and cautious with gait, minimal clearance of stride   Stairs            Wheelchair Mobility    Modified Rankin (Stroke Patients Only)       Balance                                    Cognition Arousal/Alertness: Awake/alert Behavior During  Therapy: WFL for tasks assessed/performed Overall Cognitive Status: History of cognitive impairments - at baseline                      Exercises General Exercises - Lower Extremity Ankle Circles/Pumps: AROM;Both;10 reps Long Arc Quad: AROM;Both;10 reps Hip Flexion/Marching: AROM;Both;10 reps    General Comments        Pertinent Vitals/Pain HR 140s with activity    Home Living                      Prior Function            PT Goals (current goals can now be found in the care plan section) Acute Rehab PT Goals Patient Stated Goal: [facility] PT Goal Formulation: With patient Time For Goal Achievement: 08/09/13 Potential to Achieve Goals: Good Progress towards PT goals: Progressing toward goals    Frequency  Min 3X/week    PT Plan Current plan remains appropriate    Co-evaluation             End of Session Equipment Utilized During Treatment: Gait belt Activity Tolerance: Patient tolerated treatment well Patient left: in bed;with call bell/phone within reach;with bed alarm set     Time: 3785-8850 PT Time Calculation (min): 24 min  Charges:  $Gait Training: 8-22 mins $Therapeutic Activity: 8-22 mins  G CodesDuncan Dull 01-Aug-2013, 10:39 AM [name], PT DPT  [phone]

## 2013-07-27 NOTE — Progress Notes (Signed)
Family Medicine Teaching Service Daily Progress Note Intern Pager: [phone]  Patient name: David [name] Medical record number: [id number] Date of birth: 05-02-1940 Age: 73 y.o. Gender: male  Primary Care Provider: PROVIDER NOT IN SYSTEM Consultants: Cardiology, Palliative Care Code Status: DNR  Pt Overview and Major Events to Date:  7/6 Echo LV EF 15% (previously 40-45% in 2014), mod LA dilation, diffuse hypokinesis   Assessment and Plan:  Brixon Zhen is a 73 y.o. male presenting with acute dyspnea. PMH is significant for chronic systolic CHF (EF 42-35%; Echo 08/2012), Atrial flutter on coumadin, HTN, COPD w/ emphysema, CAD.   Chronic systolic CHF/ischemic CM- ProBNP of 21K on admission. Chest xray clear. Given IV Lasix x1: neg 1.2L, weight down 5lbs.  - Echo (7/6) with EF 15% (down from 40-45% in 08/2012) , mod LA dilation and diffuse hypokinesis - BP too low to consider ACEi/ARB.  - Continue Metoprolol.  -Cardiology c/s, appreciate recs.  Will discuss patient with Heart failure team today.  Acute COPD exacerbation - Pt was recently admitted for COPD exacerbation and needed Bipap, discharged 7/2. Patient was treated as a COPD exacerbation this visit in the ED, d/c Levaquin and Prednisone 7/6.  Chest Xray unremarkable. Dyspnea improved. - Supplemental O2 as needed (breathing comfortably on RA since admission) - Scheduled Duonebs Q4, albuterol Q2PRN - Continue home QVAR (flovent substituted)  Chronic atrial flutter. s/p ablation and cardioversion in 2008 and not a candidate for further treatment. Goal HR 110 (110-130s O/N) - Continue Metoprolol 25mg  BID.  Cannot titrate up 2/2 hypotension - Continue coumadin. Currently subtheraputic. Per pharmacy.  - Not a candidate for rate control due to contraindications to amio (ILD), dilt (LV dysfxn), digoxin (h/o bradycardia with use) - Palliative care consulted.  Will hopefully be able to contact family for [place] discussion.  Leukocytosis: WBC  15.7 (7/7).  Likely related to recent prednisone.  Afebrile.  - Continue to monitor  HTN: Currently hypotensive, SBP 80s-110 O/N.  At last hospitalization, baseline SBPs in 100s.  - Continue metoprolol as above - Will continue to monitor closely  Transaminitis: No h/o Liver disease.  AST 63, ALT 91 on admission (not elevated on previous admission) - Continue to monitor - Ammonia level normal  Hx of Hypothyroidism  - Not on synthroid currently  - TSH from last admission was low at 0.237 however FT3 nl and FT4nl   Psych - Unclear diagnosis. Patient appears to have baseline dementia.  - Continue home medications: Cymbalta, Remeron, Invega   CAD, HLD  - Continue statin (lipitor substituted for crestor), BB   History of alcohol abuse Patient placed on CIWA protocol on previous admission. Had CIWA scores ranging from 0-14. Will continue to monitor on CIWA protocol.  Hyperglycemia  - Elevated on BMP  - A1c 7.0  - Given acute illness, will hold treatment currently   FEN/GI: Heart healthy diet; NS IV lock Prophylaxis: Coumadin Per Pharm  Disposition: SNF, pending clinical improvement and placement. PT recs 24h supervision (can return to ALF with assist).  Will f/u OT recs.  Palliative Care c/s for GOC discussion with family, appreciate recs.  Subjective:  Patient is demented.  Denies any complaints.  He does not talk much.  Objective: Temp:  [97.5 F (36.4 C)-98.3 F (36.8 C)] 98.3 F (36.8 C) (07/07 0800) Pulse Rate:  [78-134] 133 (07/07 0800) Resp:  [17-29] 19 (07/07 0800) BP: (82-110)/(65-72) 106/67 mmHg (07/07 0800) SpO2:  [94 %-99 %] 96 % (07/07 0800) Physical Exam: General: Pleasant, demented,  elderly man in NAD. Thin and frail  Heart: tachycardic no murmurs.  Lungs: Normal WOB. Rhonchi bilaterally Abdomen: Soft, non-tender, non-distended, BS + Extremities: No clubbing, cyanosis or edema.  Laboratory:  Recent Labs Lab 07/25/13 2202 07/26/13 0511 07/27/13 0445   WBC 12.0* 10.5 15.7*  HGB 13.7 12.6* 13.4  HCT 41.6 39.1 40.5  PLT 175 161 168    Recent Labs Lab 07/25/13 2202 07/26/13 0511 07/27/13 0445  NA 135* 138 136*  K 5.0 4.7 5.1  CL 97 99 97  CO2 22 26 22   BUN 29* 28* 32*  CREATININE 0.97 1.03 1.05  CALCIUM 9.2 8.8 9.2  PROT 6.5 6.2  --   BILITOT 0.9 1.1  --   ALKPHOS 105 92  --   ALT 92* 91*  --   AST 79* 63*  --   GLUCOSE 204* 184* 191*    AM Cortisol 38.8 (7/6)  Ammonia 37  Imaging/Diagnostic Tests: CXR (7/5): The heart size and mediastinal contours are within normal limits. Both lungs are clear. The visualized skeletal structures are unremarkable. IMPRESSION: No active disease.  Echo (7/6) Study Conclusions - Left ventricle: Prominant apical trabeculations and false band. The cavity size was moderately dilated. Wall thickness was normal. The estimated ejection fraction was 15%. Diffuse hypokinesis. The study is not technically sufficient to allow evaluation of LV diastolic function. - Atrial septum: No defect or patent foramen ovale was identified. - Pulmonary arteries: PA peak pressure: 34 mm Hg (S).   [name], MD 07/27/2013, 8:19 AM PGY-1, [facility] Intern pager: [phone], text pages welcome

## 2013-07-27 NOTE — Progress Notes (Signed)
Agree with note as outlined by Perry Mount PA-C

## 2013-07-27 NOTE — Progress Notes (Signed)
OT Cancellation Note  Patient Details Name: Mikey Maffett MRN: [id number] DOB: Aug 29, 1940   Cancelled Treatment:    Reason Eval/Treat Not Completed: Other (comment) Pt resident of [facility] and requires assistance for all ADL. Will defer any OT needs to ALF. OT signing off.  Binger, OTR/L  [phone] 07/27/2013 07/27/2013, 9:29 AM

## 2013-07-27 NOTE — Clinical Social Work Note (Signed)
Per MD, treatment team believes patient needs to discharge to a SNF. Family member [name] was interested in SNF from last admission so CSW will make referrals.  Liz Beach MSW, [place], [place], [phone]

## 2013-07-27 NOTE — Clinical Social Work Placement (Signed)
Clinical Social Work Department CLINICAL SOCIAL WORK PLACEMENT NOTE 07/27/2013  Patient:  David [name], David [name]  Account Number:  [id number] [place] date:  07/25/2013  Clinical Social Worker:  Kemper Durie, [place]  Date/time:  07/27/2013 04:18 PM  Clinical Social Work is seeking post-discharge placement for this patient at the following level of care:   SKILLED NURSING   (*CSW will update this form in Epic as items are completed)   07/27/2013  Patient/family provided with [place] Department of Clinical Social Work's list of facilities offering this level of care within the geographic area requested by the patient (or if unable, by the patient's family).  07/27/2013  Patient/family informed of their freedom to choose among providers that offer the needed level of care, that participate in Medicare, Medicaid or managed care program needed by the patient, have an available bed and are willing to accept the patient.  07/27/2013  Patient/family informed of [facility]' ownership interest in [facility], as well as of the fact that they are under no obligation to receive care at this facility.  PASARR submitted to EDS on  PASARR number received on   FL2 transmitted to all facilities in geographic area requested by pt/family on  07/27/2013 FL2 transmitted to all facilities within larger geographic area on   Patient informed that his/her managed care company has contracts with or will negotiate with  certain facilities, including the following:     Patient/family informed of bed offers received:   Patient chooses bed at  Physician recommends and patient chooses bed at    Patient to be transferred to  on   Patient to be transferred to facility by  Patient and family notified of transfer on  Name of family member notified:    The following physician request were entered in Epic:   Additional Comments:    Liz Beach MSW, [place], [place], [phone]

## 2013-07-27 NOTE — Progress Notes (Signed)
ANTICOAGULATION CONSULT NOTE - Follow up [place] for Coumadin Indication: atrial flutter  Allergies  Allergen Reactions  . Ace Inhibitors   . Oat Flour [Oat]     Unknown per MAR  . Other     Greens: unknown reaction per [facility]    Patient Measurements: Height: 6\' 1"  (185.4 cm) Weight: 143 lb 8.3 oz (65.1 kg) IBW/kg (Calculated) : 79.9  Vital Signs: Temp: 98.3 F (36.8 C) (07/07 0800) Temp src: Oral (07/07 0800) BP: 106/67 mmHg (07/07 0800) Pulse Rate: 133 (07/07 0800)  Labs:  Recent Labs  07/25/13 2202 07/26/13 0500 07/26/13 0511 07/27/13 0445  HGB 13.7  --  12.6* 13.4  HCT 41.6  --  39.1 40.5  PLT 175  --  161 168  LABPROT  --  21.3*  --  28.2*  INR  --  1.84*  --  2.64*  CREATININE 0.97  --  1.03 1.05  TROPONINI <0.30  --   --   --     Estimated Creatinine Clearance: 57.7 ml/min (by C-G formula based on Cr of 1.05).   Medical History: Past Medical History  Diagnosis Date  . Community acquired pneumonia     Right middle lobe community-acquired pneumonia  . Toxic metabolic encephalopathy   . Chronic atrial flutter     a. rate controlled with bb therapy;  b. h/o bradycardia when on digoxin 12/2011.  . Tobacco abuse   . History of coronary artery disease 2008    a. Cath at [facility]: RCA 95%, RI 60%  - medical therapy  . Ischemic cardiomyopathy     a. prev documented EF of 20-30%;  b. 09/2012 Echo: EF 40-45%, diff HK, mild MR, mild bi-atrial enlargement.  . History of prostate cancer   . Chronic obstructive pulmonary disease     with mild bronchospasm, improved  . Hypertension   . History of alcohol abuse     History of prior heavy alcohol abuse  . History of depression   . Altered mental status 2012   . Noncompliance   . Depression   . Ulcer   . Hx of migraines   . Hyperlipidemia   . Colon cancer     Medications:  Prescriptions prior to admission  Medication Sig Dispense Refill  . albuterol (PROAIR HFA) 108 (90 BASE)  MCG/ACT inhaler Inhale 2 puffs into the lungs every 6 (six) hours as needed for wheezing or shortness of breath.      . beclomethasone (QVAR) 40 MCG/ACT inhaler Inhale 2 puffs into the lungs 2 (two) times daily.      . DULoxetine (CYMBALTA) 60 MG capsule Take 60 mg by mouth daily.      . folic acid (FOLVITE) 1 MG tablet Take 1 tablet (1 mg total) by mouth daily.  30 tablet  1  . guaiFENesin (MUCINEX) 600 MG 12 hr tablet Take 1 tablet (600 mg total) by mouth 2 (two) times daily.      Marland Kitchen lactulose (CHRONULAC) 10 GM/15ML solution Take 20 g by mouth daily.      Marland Kitchen levofloxacin (LEVAQUIN) 750 MG tablet Take 750 mg by mouth daily.      . metoprolol (LOPRESSOR) 50 MG tablet Take 1 tablet (50 mg total) by mouth 2 (two) times daily.  60 tablet  0  . mirtazapine (REMERON) 15 MG tablet Take 15 mg by mouth at bedtime.      . Multiple Vitamin (MULTIVITAMIN WITH MINERALS) TABS tablet Take 1 tablet by  mouth daily.      Marland Kitchen OVER THE COUNTER MEDICATION Take by mouth daily with breakfast. "Mighty Shake"      . paliperidone (INVEGA) 6 MG 24 hr tablet Take 6 mg by mouth daily.       . rosuvastatin (CRESTOR) 20 MG tablet Take 20 mg by mouth daily.      Marland Kitchen thiamine (VITAMIN B-1) 100 MG tablet Take 100 mg by mouth daily.      Marland Kitchen tiotropium (SPIRIVA) 18 MCG inhalation capsule Place 18 mcg into inhaler and inhale daily.      Marland Kitchen warfarin (COUMADIN) 5 MG tablet Take 5 mg by mouth every evening. 5pm       Scheduled:  . antiseptic oral rinse  15 mL Mouth Rinse q12n4p  . atorvastatin  40 mg Oral q1800  . chlorhexidine  15 mL Mouth Rinse BID  . DULoxetine  60 mg Oral Daily  . fluticasone  1 puff Inhalation BID  . folic acid  1 mg Oral Daily  . guaifenesin  300 mg Oral Q6H  . ipratropium-albuterol  3 mL Nebulization BID  . lactulose  20 g Oral Daily  . metoprolol tartrate  25 mg Oral BID  . mirtazapine  15 mg Oral QHS  . multivitamin with minerals  1 tablet Oral Daily  . paliperidone  6 mg Oral Daily  . sodium chloride  3  mL Intravenous Q12H  . thiamine  100 mg Oral Daily   Or  . thiamine  100 mg Intravenous Daily  . tiotropium  18 mcg Inhalation Daily  . warfarin  1 mg Oral ONCE-1800  . Warfarin - Pharmacist Dosing Inpatient   Does not apply q1800    Assessment: 73yo male c/o SOB x3d that became acutely worse PTA, rec'd neb by EMS which helped Sx, to continue Coumadin during admission for Aflutter; last Coumadin dose PTA 7/5. INR today has risen from 1.33>>1.84>>2.64. Rise is likely due to poor oral intake and steroid doses. H/H and Plt wnl   Home Coumadin dose: 5 mg daily   Goal of Therapy:  INR 2-3   Plan:  1) Give reduced Coumadin dose of 1 mg x 1 dose given patient is NPO and rise in INR 2) Monitor daily PT/INR     Albertina Parr, PharmD.  Clinical Pharmacist Pager [phone]

## 2013-07-27 NOTE — Progress Notes (Signed)
SUBJECTIVE:  Does not converse  OBJECTIVE:   Vitals:   Filed Vitals:   07/27/13 0315 07/27/13 0726 07/27/13 0800 07/27/13 1158  BP: 90/70  106/67 98/71  Pulse:   133 131  Temp: 98.1 F (36.7 C)  98.3 F (36.8 C)   TempSrc: Oral  Oral   Resp: 17  19 23   Height:      Weight:      SpO2:  94% 96% 94%   I&O's:   Intake/Output Summary (Last 24 hours) at 07/27/13 1431 Last data filed at 07/27/13 1310  Gross per 24 hour  Intake      0 ml  Output    775 ml  Net   -775 ml   TELEMETRY: Reviewed telemetry pt in atrial flutter with RVR:     PHYSICAL EXAM General: Well developed, well nourished, in no acute distress Head: Eyes PERRLA, No xanthomas.   Normal cephalic and atramatic  Lungs:   Diffuse rhonchi Heart:   RRR and tachy S1 S2 Pulses are 2+ & equal. Abdomen: Bowel sounds are positive, abdomen soft and non-tender without masses Extremities:   No clubbing, cyanosis or edema.  DP +1   LABS: Basic Metabolic Panel:  Recent Labs  07/26/13 0511 07/27/13 0445  NA 138 136*  K 4.7 5.1  CL 99 97  CO2 26 22  GLUCOSE 184* 191*  BUN 28* 32*  CREATININE 1.03 1.05  CALCIUM 8.8 9.2   Liver Function Tests:  Recent Labs  07/26/13 0511 07/27/13 0445  AST 63* 56*  ALT 91* 95*  ALKPHOS 92 87  BILITOT 1.1 1.1  PROT 6.2 6.3  ALBUMIN 3.0* 3.0*   No results found for this basename: LIPASE, AMYLASE,  in the last 72 hours CBC:  Recent Labs  07/26/13 0511 07/27/13 0445  WBC 10.5 15.7*  HGB 12.6* 13.4  HCT 39.1 40.5  MCV 90.9 89.8  PLT 161 168   Cardiac Enzymes:  Recent Labs  07/25/13 2202  TROPONINI <0.30   BNP: No components found with this basename: POCBNP,  D-Dimer: No results found for this basename: DDIMER,  in the last 72 hours Hemoglobin A1C: No results found for this basename: HGBA1C,  in the last 72 hours Fasting Lipid Panel: No results found for this basename: CHOL, HDL, LDLCALC, TRIG, CHOLHDL, LDLDIRECT,  in the last 72 hours Thyroid Function  Tests: No results found for this basename: TSH, T4TOTAL, FREET3, T3FREE, THYROIDAB,  in the last 72 hours Anemia Panel: No results found for this basename: VITAMINB12, FOLATE, FERRITIN, TIBC, IRON, RETICCTPCT,  in the last 72 hours Coag Panel:   Lab Results  Component Value Date   INR 2.64* 07/27/2013   INR 1.84* 07/26/2013   INR 1.33 07/22/2013    RADIOLOGY: Dg Chest Port 1 View  07/25/2013   CLINICAL DATA:  Shortness of breath.  EXAM: PORTABLE CHEST - 1 VIEW  COMPARISON:  07/21/2013  FINDINGS: The heart size and mediastinal contours are within normal limits. Both lungs are clear. The visualized skeletal structures are unremarkable.  IMPRESSION: No active disease.   Electronically Signed   By: Lucienne Capers M.D.   On: 07/25/2013 22:28   Dg Chest Port 1 View  07/21/2013   CLINICAL DATA:  Difficulty breathing  EXAM: PORTABLE CHEST - 1 VIEW  COMPARISON:  July 17, 2013  FINDINGS: There is no edema or consolidation. Heart is borderline enlarged with normal pulmonary vascularity. No adenopathy. No pneumothorax. No bone lesions.  IMPRESSION: No edema or  consolidation.  Heart borderline enlarged but stable.   Electronically Signed   By: Lowella Grip M.D.   On: 07/21/2013 06:58   Dg Chest Portable 1 View  07/17/2013   CLINICAL DATA:  SHORTNESS OF BREATH  EXAM: PORTABLE CHEST - 1 VIEW  COMPARISON:  05/05/2013  FINDINGS: Mild cardiomegaly. Coarse bronchovascular markings as before. No focal airspace consolidation. Stable left infrahilar subsegmental atelectasis or scarring. Atheromatous aorta. No effusion. Visualized skeletal structures are unremarkable.  IMPRESSION: 1. Stable cardiomegaly and chronic interstitial changes without acute or superimposed abnormality.   Electronically Signed   By: Arne Cleveland M.D.   On: 07/17/2013 15:56    ASSESSMENT AND PLAN  David [name] is a 73 yo man with PMH of CAD with LHC '08 with 95% RCA and 60% Ramus treated medically, ischemic cardiomyopathy with last known EF  40-45% '14, COPD and chronic ILD with chronic respiratory failure, atrial flutter (chronically) on warfarin, hypothyroidism, prior heavy tobacco/alcohol use who lives in a nursing home and recently admitted for shortness of breath who was admitted yesterday with acute dyspnea. ECHO on this admission with EF 15%.  David [name] is a 73 y.o. male with a history of CAD s/p LHC RCA 95%, RI 60% - rx therapy, COPD, ischemic cardiomyopathy (EF 40-45% ECHO 2014), HTN, hypothyroidism, chronic atrial flutter on coumadin, and heavy alcohol use/tobacco abuse who was brought in from the nursing home to [facility] on 07/26/13 for acute dyspnea.He is being treated for an acute COPD exacerbation in the setting of chronic ILD and chronic respiratory failure as well as acute CHF. He is chronically in atrial flutter, but had had some RVR and cardiology has been consulted for help in rate control.  Chronic atrial flutter. He had cardioversion in 2008 but has since had recurrence.  -- 09/2012 hospitalization he had RVR. He was initially digitalized but this was later stopped because of history of severe bradycardia on this medication.  -- Controlled as an outpatient on metoprolol 25mg  mg BID only. Continued on this: cannot up-titrate due to hypotension  -- Continue coumadin. Currently subtheraputic. Per pharmacy.  -- Currently HR 110-130 bmp. Would avoid dilt in the setting of LV dysfunction and amiodarone with lung disease. Would just continue to monitor at this point with patient poor functional status and hypotension. Palliative care consulted.  -- It doesn't look like he has ever been seen by EP for evaluation of a pacemaker. This is most likely due to his poor overall clinical status and dementia.  No good options at this time for rate control.  Will await Palliative Care consult. Chronic systolic CHF/ischemic CM- ProBNP of 21K plus. Chest xray clear. Given IV Lasix: neg 1.6L, weight down 5lbs.  -- Echo on this admission with EF  15%, mod LA dilation and diffuse hypokinesis. This is significantly decreased from 09/20/12 EF 40 to 45%, mild MR.  -- Historically blood pressure too low to consider ACE inhibitor or ARB.  -- Continue BB.  -- IV Lasix limited by hypotension Acute COPD exacerbation  -- Continue tx per IM  History of alcohol abuse. Possibly withdrawing.  -- Agree with CIWA protocol  -- Monitor for DTs.   CAD- Cath at [facility]: RCA 95%, RI 60% - medical therapy  -- No objective signs of ischemia. No chest pain.  -- Continue statin, BB. Not on ASA due to coumadin use.  HTN  -- BP's currently low, SBPs in 80s  -- Will continue to monitor closely  Dispo- Consulted Palliative  [place], MD  07/27/2013  2:31 PM

## 2013-07-27 NOTE — Clinical Social Work Psychosocial (Signed)
Clinical Social Work Department BRIEF PSYCHOSOCIAL ASSESSMENT 07/27/2013  Patient:  David [name], David [name]     Account Number:  [id number]     Admit date:  07/25/2013  Clinical Social Worker:  Lovey Newcomer  Date/Time:  07/27/2013 11:11 AM  Referred by:  Physician  Date Referred:  07/27/2013 Referred for  ALF Placement   Other Referral:   Interview type:  Other - See comment Other interview type:   CSW unable assess patient or speak with family due to difficulty with reaching family.    PSYCHOSOCIAL DATA Living Status:  FACILITY Admitted from facility:  [facility] Level of care:  Assisted Living Primary support name:  David [name] Primary support relationship to patient:  FAMILY Degree of support available:   Support is fair.    CURRENT CONCERNS Current Concerns  Post-Acute Placement   Other Concerns:    SOCIAL WORK ASSESSMENT / PLAN CSW unable to assess patient directly and unable to reach family. CSW has collected collateral information from last admission and patient's ALF. According to facility patient is a long term resident of theres and is able to return at discharge. Facility also reports that the family is difficult to reach at times. CSW was able to speak with the patient's neice David [name] once during last admission and she did express interest in looking at different facilities. CSW is unable to reach David [name] to discuss this further with David [name], so CSW will plan for patient to return to [facility]F at discharge.   Assessment/plan status:  Psychosocial Support/Ongoing Assessment of Needs Other assessment/ plan:   Complete Fl2, Fax, PASRR   Information/referral to community resources:   CSW contact information left in message to [place].    PATIENT'S/FAMILY'S RESPONSE TO PLAN OF CARE: Patient's family member David [name] is not available to speak with CSW at this time. CSW will continue to [place] MSW, [place], [place], [phone]

## 2013-07-28 DIAGNOSIS — Z7901 Long term (current) use of anticoagulants: Secondary | ICD-10-CM

## 2013-07-28 LAB — COMPREHENSIVE METABOLIC PANEL
ALBUMIN: 2.9 g/dL — AB (ref 3.5–5.2)
ALT: 340 U/L — ABNORMAL HIGH (ref 0–53)
ALT: 369 U/L — ABNORMAL HIGH (ref 0–53)
ANION GAP: 16 — AB (ref 5–15)
AST: 368 U/L — ABNORMAL HIGH (ref 0–37)
AST: 409 U/L — ABNORMAL HIGH (ref 0–37)
Albumin: 3 g/dL — ABNORMAL LOW (ref 3.5–5.2)
Alkaline Phosphatase: 129 U/L — ABNORMAL HIGH (ref 39–117)
Alkaline Phosphatase: 194 U/L — ABNORMAL HIGH (ref 39–117)
Anion gap: 15 (ref 5–15)
BUN: 39 mg/dL — ABNORMAL HIGH (ref 6–23)
BUN: 41 mg/dL — ABNORMAL HIGH (ref 6–23)
CO2: 22 mEq/L (ref 19–32)
CO2: 23 mEq/L (ref 19–32)
CREATININE: 1.18 mg/dL (ref 0.50–1.35)
Calcium: 8.9 mg/dL (ref 8.4–10.5)
Calcium: 8.9 mg/dL (ref 8.4–10.5)
Chloride: 94 mEq/L — ABNORMAL LOW (ref 96–112)
Chloride: 96 mEq/L (ref 96–112)
Creatinine, Ser: 1.04 mg/dL (ref 0.50–1.35)
GFR calc Af Amer: 69 mL/min — ABNORMAL LOW (ref >90)
GFR calc non Af Amer: 59 mL/min — ABNORMAL LOW (ref >90)
GFR, EST AFRICAN AMERICAN: 80 mL/min — AB (ref >90)
GFR, EST NON AFRICAN AMERICAN: 69 mL/min — AB (ref >90)
GLUCOSE: 213 mg/dL — AB (ref 70–99)
Glucose, Bld: 245 mg/dL — ABNORMAL HIGH (ref 70–99)
POTASSIUM: 6.2 meq/L — AB (ref 3.7–5.3)
Potassium: 5.3 mEq/L (ref 3.7–5.3)
SODIUM: 131 meq/L — AB (ref 137–147)
Sodium: 135 mEq/L — ABNORMAL LOW (ref 137–147)
Total Bilirubin: 1.6 mg/dL — ABNORMAL HIGH (ref 0.3–1.2)
Total Bilirubin: 1.2 mg/dL (ref 0.3–1.2)
Total Protein: 6.6 g/dL (ref 6.0–8.3)
Total Protein: 6.3 g/dL (ref 6.0–8.3)

## 2013-07-28 LAB — PROTIME-INR
INR: 2.91 — ABNORMAL HIGH (ref 0.00–1.49)
PROTHROMBIN TIME: 30.4 s — AB (ref 11.6–15.2)

## 2013-07-28 LAB — CBC
HCT: 41.2 % (ref 39.0–52.0)
Hemoglobin: 13.9 g/dL (ref 13.0–17.0)
MCH: 30.3 pg (ref 26.0–34.0)
MCHC: 33.7 g/dL (ref 30.0–36.0)
MCV: 89.8 fL (ref 78.0–100.0)
Platelets: 156 10*3/uL (ref 150–400)
RBC: 4.59 MIL/uL (ref 4.22–5.81)
RDW: 15.7 % — ABNORMAL HIGH (ref 11.5–15.5)
WBC: 14.8 10*3/uL — ABNORMAL HIGH (ref 4.0–10.5)

## 2013-07-28 MED ORDER — WARFARIN SODIUM 1 MG PO TABS
1.0000 mg | ORAL_TABLET | Freq: Once | ORAL | Status: AC
  Administered 2013-07-28: 1 mg via ORAL
  Filled 2013-07-28: qty 1

## 2013-07-28 NOTE — Progress Notes (Signed)
Pharmacist Heart Failure Core Measure Documentation  Assessment: David [name] has an EF documented as 15% on 07/26/13 by ECHO.  Rationale: Heart failure patients with left ventricular systolic dysfunction (LVSD) and an EF < 40% should be prescribed an angiotensin converting enzyme inhibitor (ACEI) or angiotensin receptor blocker (ARB) at discharge unless a contraindication is documented in the medical record.  This patient is not currently on an ACEI or ARB for HF.  This note is being placed in the record in order to provide documentation that a contraindication to the use of these agents is present for this encounter.  ACE Inhibitor or Angiotensin Receptor Blocker is contraindicated (specify all that apply)  []   ACEI allergy AND ARB allergy []   Angioedema []   Moderate or severe aortic stenosis []   Hyperkalemia [x]   Hypotension []   Renal artery stenosis []   Worsening renal function, preexisting renal disease or dysfunction   Albertina Parr, PharmD.  Clinical Pharmacist Pager [phone]

## 2013-07-28 NOTE — Progress Notes (Signed)
I have left another message with [name] to call me back and at least leave a message letting me know a time I can call and reach her to discuss goals of care. I will update and continue to follow and attempt to contact family.  Vinie Sill, NP Palliative Medicine Team Pager # [phone] (M-F 8a-5p) Team Phone # [phone] (Nights/Weekends)

## 2013-07-28 NOTE — Progress Notes (Signed)
SUBJECTIVE:  Opens eyes when spoken to  OBJECTIVE:   Vitals:   Filed Vitals:   07/28/13 0012 07/28/13 0406 07/28/13 0734 07/28/13 0831  BP: 114/81 95/64 107/76   Pulse: 126 130 130   Temp: 97.6 F (36.4 C) 97.4 F (36.3 C) 97.7 F (36.5 C)   TempSrc: Oral Axillary Axillary   Resp: 30 17 28    Height:      Weight:      SpO2: 96% 93% 94% 95%   I&O's:   Intake/Output Summary (Last 24 hours) at 07/28/13 1053 Last data filed at 07/28/13 0759  Gross per 24 hour  Intake    120 ml  Output    525 ml  Net   -405 ml   TELEMETRY: Reviewed telemetry pt in atrial flutter with RVR:     PHYSICAL EXAM General: Well developed, well nourished, in no acute distress Head: Eyes PERRLA, No xanthomas.   Normal cephalic and atramatic  Lungs:   Clear bilaterally to auscultation and percussion. Heart:   tachy S1 S2 Pulses are 2+ & equal. Abdomen: Bowel sounds are positive, abdomen soft and non-tender without masses Extremities:   No clubbing, cyanosis or edema.  DP +1 Neuro: Alert and oriented X 3. Psych:  Good affect, responds appropriately   LABS: Basic Metabolic Panel:  Recent Labs  07/26/13 0511 07/27/13 0445  NA 138 136*  K 4.7 5.1  CL 99 97  CO2 26 22  GLUCOSE 184* 191*  BUN 28* 32*  CREATININE 1.03 1.05  CALCIUM 8.8 9.2   Liver Function Tests:  Recent Labs  07/26/13 0511 07/27/13 0445  AST 63* 56*  ALT 91* 95*  ALKPHOS 92 87  BILITOT 1.1 1.1  PROT 6.2 6.3  ALBUMIN 3.0* 3.0*   No results found for this basename: LIPASE, AMYLASE,  in the last 72 hours CBC:  Recent Labs  07/27/13 0445 07/28/13 0820  WBC 15.7* 14.8*  HGB 13.4 13.9  HCT 40.5 41.2  MCV 89.8 89.8  PLT 168 156   Cardiac Enzymes:  Recent Labs  07/25/13 2202  TROPONINI <0.30   BNP: No components found with this basename: POCBNP,  D-Dimer: No results found for this basename: DDIMER,  in the last 72 hours Hemoglobin A1C: No results found for this basename: HGBA1C,  in the last 72  hours Fasting Lipid Panel: No results found for this basename: CHOL, HDL, LDLCALC, TRIG, CHOLHDL, LDLDIRECT,  in the last 72 hours Thyroid Function Tests: No results found for this basename: TSH, T4TOTAL, FREET3, T3FREE, THYROIDAB,  in the last 72 hours Anemia Panel: No results found for this basename: VITAMINB12, FOLATE, FERRITIN, TIBC, IRON, RETICCTPCT,  in the last 72 hours Coag Panel:   Lab Results  Component Value Date   INR 2.91* 07/28/2013   INR 2.64* 07/27/2013   INR 1.84* 07/26/2013    RADIOLOGY: Dg Chest Port 1 View  07/25/2013   CLINICAL DATA:  Shortness of breath.  EXAM: PORTABLE CHEST - 1 VIEW  COMPARISON:  07/21/2013  FINDINGS: The heart size and mediastinal contours are within normal limits. Both lungs are clear. The visualized skeletal structures are unremarkable.  IMPRESSION: No active disease.   Electronically Signed   By: Lucienne Capers M.D.   On: 07/25/2013 22:28   Dg Chest Port 1 View  07/21/2013   CLINICAL DATA:  Difficulty breathing  EXAM: PORTABLE CHEST - 1 VIEW  COMPARISON:  July 17, 2013  FINDINGS: There is no edema or consolidation. Heart is borderline  enlarged with normal pulmonary vascularity. No adenopathy. No pneumothorax. No bone lesions.  IMPRESSION: No edema or consolidation.  Heart borderline enlarged but stable.   Electronically Signed   By: Lowella Grip M.D.   On: 07/21/2013 06:58   Dg Chest Portable 1 View  07/17/2013   CLINICAL DATA:  SHORTNESS OF BREATH  EXAM: PORTABLE CHEST - 1 VIEW  COMPARISON:  05/05/2013  FINDINGS: Mild cardiomegaly. Coarse bronchovascular markings as before. No focal airspace consolidation. Stable left infrahilar subsegmental atelectasis or scarring. Atheromatous aorta. No effusion. Visualized skeletal structures are unremarkable.  IMPRESSION: 1. Stable cardiomegaly and chronic interstitial changes without acute or superimposed abnormality.   Electronically Signed   By: Arne Cleveland M.D.   On: 07/17/2013 15:56    ASSESSMENT AND  PLAN  David [name] is a 73 yo man with PMH of CAD with LHC '08 with 95% RCA and 60% Ramus treated medically, ischemic cardiomyopathy with last known EF 40-45% '14, COPD and chronic ILD with chronic respiratory failure, atrial flutter (chronically) on warfarin, hypothyroidism, prior heavy tobacco/alcohol use who lives in a nursing home and recently admitted for shortness of breath who was admitted yesterday with acute dyspnea. ECHO on this admission with EF 15%.  David [name] is a 73 y.o. male with a history of CAD s/p LHC RCA 95%, RI 60% - rx therapy, COPD, ischemic cardiomyopathy (EF 40-45% ECHO 2014), HTN, hypothyroidism, chronic atrial flutter on coumadin, and heavy alcohol use/tobacco abuse who was brought in from the nursing home to [facility] on 07/26/13 for acute dyspnea.He is being treated for an acute COPD exacerbation in the setting of chronic ILD and chronic respiratory failure as well as acute CHF. He is chronically in atrial flutter, but had had some RVR and cardiology has been consulted for help in rate control.  Chronic atrial flutter. He had cardioversion in 2008 but has since had recurrence.  -- 09/2012 hospitalization he had RVR. He was initially digitalized but this was later stopped because of history of severe bradycardia on this medication.  -- Controlled as an outpatient on metoprolol 25mg  mg BID only. Continued on this: cannot up-titrate due to hypotension  -- Continue coumadin. Currently subtheraputic. Per pharmacy.  -- Currently HR 110-130 bmp. Would avoid dilt in the setting of LV dysfunction and amiodarone with lung disease. Would just continue to monitor at this point with patient poor functional status and hypotension. Palliative care consulted.  -- It doesn't look like he has ever been seen by EP for evaluation of a pacemaker. This is most likely due to his poor overall clinical status and dementia. No good options at this time for rate control. Will await Palliative Care consult.   Chronic systolic CHF/ischemic CM- ProBNP of 21K plus. Chest xray clear. Given IV Lasix: neg 1.6L, weight down 5lbs.  -- Echo on this admission with EF 15%, mod LA dilation and diffuse hypokinesis. This is significantly decreased from 09/20/12 EF 40 to 45%, mild MR.  -- Historically blood pressure too low to consider ACE inhibitor or ARB.  -- Continue BB.  -- IV Lasix limited by hypotension  Acute COPD exacerbation  -- Continue tx per IM  History of alcohol abuse. Possibly withdrawing.  -- Agree with CIWA protocol  -- Monitor for DTs.   CAD- Cath at [facility]: RCA 95%, RI 60% - medical therapy  -- No objective signs of ischemia. No chest pain.  -- Continue statin, BB. Not on ASA due to coumadin use.  HTN  --  BP's currently low, SBPs in 80s  -- Will continue to monitor closely  [place] R, MD  07/28/2013  10:53 AM

## 2013-07-28 NOTE — Progress Notes (Signed)
ANTICOAGULATION CONSULT NOTE - Follow up [place] for Coumadin Indication: atrial flutter  Allergies  Allergen Reactions  . Ace Inhibitors   . Oat Flour [Oat]     Unknown per MAR  . Other     Greens: unknown reaction per [facility]    Patient Measurements: Height: 6\' 1"  (185.4 cm) Weight: 143 lb 8.3 oz (65.1 kg) IBW/kg (Calculated) : 79.9  Vital Signs: Temp: 97.7 F (36.5 C) (07/08 0734) Temp src: Axillary (07/08 0734) BP: 107/76 mmHg (07/08 0734) Pulse Rate: 130 (07/08 0734)  Labs:  Recent Labs  07/25/13 2202 07/26/13 0500 07/26/13 0511 07/27/13 0445 07/28/13 0820  HGB 13.7  --  12.6* 13.4 13.9  HCT 41.6  --  39.1 40.5 41.2  PLT 175  --  161 168 156  LABPROT  --  21.3*  --  28.2* 30.4*  INR  --  1.84*  --  2.64* 2.91*  CREATININE 0.97  --  1.03 1.05  --   TROPONINI <0.30  --   --   --   --     Estimated Creatinine Clearance: 57.7 ml/min (by C-G formula based on Cr of 1.05).   Medical History: Past Medical History  Diagnosis Date  . Community acquired pneumonia     Right middle lobe community-acquired pneumonia  . Toxic metabolic encephalopathy   . Chronic atrial flutter     a. rate controlled with bb therapy;  b. h/o bradycardia when on digoxin 12/2011.  . Tobacco abuse   . History of coronary artery disease 2008    a. Cath at [facility]: RCA 95%, RI 60%  - medical therapy  . Ischemic cardiomyopathy     a. prev documented EF of 20-30%;  b. 09/2012 Echo: EF 40-45%, diff HK, mild MR, mild bi-atrial enlargement.  . History of prostate cancer   . Chronic obstructive pulmonary disease     with mild bronchospasm, improved  . Hypertension   . History of alcohol abuse     History of prior heavy alcohol abuse  . History of depression   . Altered mental status 2012   . Noncompliance   . Depression   . Ulcer   . Hx of migraines   . Hyperlipidemia   . Colon cancer     Medications:  Prescriptions prior to admission  Medication Sig  Dispense Refill  . albuterol (PROAIR HFA) 108 (90 BASE) MCG/ACT inhaler Inhale 2 puffs into the lungs every 6 (six) hours as needed for wheezing or shortness of breath.      . beclomethasone (QVAR) 40 MCG/ACT inhaler Inhale 2 puffs into the lungs 2 (two) times daily.      . DULoxetine (CYMBALTA) 60 MG capsule Take 60 mg by mouth daily.      . folic acid (FOLVITE) 1 MG tablet Take 1 tablet (1 mg total) by mouth daily.  30 tablet  1  . guaiFENesin (MUCINEX) 600 MG 12 hr tablet Take 1 tablet (600 mg total) by mouth 2 (two) times daily.      Marland Kitchen lactulose (CHRONULAC) 10 GM/15ML solution Take 20 g by mouth daily.      Marland Kitchen levofloxacin (LEVAQUIN) 750 MG tablet Take 750 mg by mouth daily.      . metoprolol (LOPRESSOR) 50 MG tablet Take 1 tablet (50 mg total) by mouth 2 (two) times daily.  60 tablet  0  . mirtazapine (REMERON) 15 MG tablet Take 15 mg by mouth at bedtime.      Marland Kitchen  Multiple Vitamin (MULTIVITAMIN WITH MINERALS) TABS tablet Take 1 tablet by mouth daily.      Marland Kitchen OVER THE COUNTER MEDICATION Take by mouth daily with breakfast. "Mighty Shake"      . paliperidone (INVEGA) 6 MG 24 hr tablet Take 6 mg by mouth daily.       . rosuvastatin (CRESTOR) 20 MG tablet Take 20 mg by mouth daily.      Marland Kitchen thiamine (VITAMIN B-1) 100 MG tablet Take 100 mg by mouth daily.      Marland Kitchen tiotropium (SPIRIVA) 18 MCG inhalation capsule Place 18 mcg into inhaler and inhale daily.      Marland Kitchen warfarin (COUMADIN) 5 MG tablet Take 5 mg by mouth every evening. 5pm       Scheduled:  . antiseptic oral rinse  15 mL Mouth Rinse q12n4p  . atorvastatin  40 mg Oral q1800  . chlorhexidine  15 mL Mouth Rinse BID  . DULoxetine  60 mg Oral Daily  . fluticasone  1 puff Inhalation BID  . folic acid  1 mg Oral Daily  . guaifenesin  300 mg Oral Q6H  . ipratropium-albuterol  3 mL Nebulization BID  . lactulose  20 g Oral Daily  . metoprolol tartrate  25 mg Oral BID  . mirtazapine  15 mg Oral QHS  . multivitamin with minerals  1 tablet Oral Daily   . paliperidone  6 mg Oral Daily  . sodium chloride  3 mL Intravenous Q12H  . thiamine  100 mg Oral Daily   Or  . thiamine  100 mg Intravenous Daily  . tiotropium  18 mcg Inhalation Daily  . warfarin  1 mg Oral ONCE-1800  . Warfarin - Pharmacist Dosing Inpatient   Does not apply q1800    Assessment: 73yo male c/o SOB x3d that became acutely worse PTA, rec'd neb by EMS which helped Sx, to continue Coumadin during admission for Aflutter; last Coumadin dose PTA 7/5. INR today is on upper end of goal range and stabilized. Will give lower Coumadin dose today and anticipate INR to trend down slightly tomorrow. H/H and Plt wnl   Home Coumadin dose: 5 mg daily   Goal of Therapy:  INR 2-3   Plan:  1) Repeat Coumadin dose of 1 mg x 1  2) Monitor daily PT/INR     Albertina Parr, PharmD.  Clinical Pharmacist Pager [phone]

## 2013-07-28 NOTE — Clinical Social Work Placement (Signed)
Clinical Social Work Department CLINICAL SOCIAL WORK PLACEMENT NOTE 07/28/2013  Patient:  David, [name]  Account Number:  [id number] [place] date:  07/25/2013  Clinical Social Worker:  Lovey Newcomer  Date/time:  07/27/2013 04:18 PM  Clinical Social Work is seeking post-discharge placement for this patient at the following level of care:   [place]   (*CSW will update this form in Epic as items are completed)   07/27/2013  Patient/family provided with [facility] Department of Clinical Social Work's list of facilities offering this level of care within the geographic area requested by the patient (or if unable, by the patient's family).  07/27/2013  Patient/family informed of their freedom to choose among providers that offer the needed level of care, that participate in Medicare, Medicaid or managed care program needed by the patient, have an available bed and are willing to accept the patient.  07/27/2013  Patient/family informed of [facility]' ownership interest in [facility], as well as of the fact that they are under no obligation to receive care at this facility.  PASARR submitted to EDS on  PASARR number received on   FL2 transmitted to all facilities in geographic area requested by pt/family on  07/27/2013 FL2 transmitted to all facilities within larger geographic area on   Patient informed that his/her managed care company has contracts with or will negotiate with  certain facilities, including the following:     Patient/family informed of bed offers received:  07/28/2013 Patient chooses bed at  Physician recommends and patient chooses bed at    Patient to be transferred to  on   Patient to be transferred to facility by  Patient and family notified of transfer on  Name of family member notified:    The following physician request were entered in Epic:   Additional Comments:    Liz Beach MSW, [place], [place], [phone]

## 2013-07-28 NOTE — Progress Notes (Signed)
Family Medicine Teaching Service Daily Progress Note Intern Pager: [phone]  Patient name: David [name] Medical record number: [id number] Date of birth: [birth year] Age: 73 y.o. Gender: male  Primary Care Provider: PROVIDER NOT IN SYSTEM Consultants: Cardiology, Palliative Care Code Status: DNR  Pt Overview and Major Events to Date:  7/6 Echo LV EF 15% (previously 40-45% in 2014), mod LA dilation, diffuse hypokinesis   Assessment and Plan:  David [name] is a 73 y.o. male presenting with acute dyspnea. PMH is significant for chronic systolic CHF (EF 48-88%; Echo 08/2012), Atrial flutter on coumadin, HTN, COPD w/ emphysema, CAD.   Chronic systolic CHF/ischemic CM- ProBNP of 21K on admission. Chest xray clear. Given IV Lasix x1: neg 1.2L, weight down 5lbs.  - Echo (7/6) with EF 15% (down from 40-45% in 08/2012) , mod LA dilation and diffuse hypokinesis - BP too low to consider ACEi/ARB.  - Continue Metoprolol.  -Cardiology c/s, appreciate recs.  Will discuss patient with Heart failure team today.  Acute COPD exacerbation - Pt was recently admitted for COPD exacerbation and needed Bipap, discharged 7/2. Patient was treated as a COPD exacerbation this visit in the ED, d/c Levaquin and Prednisone 7/6.  Chest Xray unremarkable. Dyspnea improved. - Supplemental O2 as needed (breathing comfortably on RA since admission) - Scheduled Duonebs Q4, albuterol Q2PRN - Continue home QVAR (flovent substituted)  Chronic atrial flutter. s/p ablation and cardioversion in 2008 and not a candidate for further treatment. Goal HR 110 (110-130s O/N) - Continue Metoprolol 25mg  BID.  Cannot titrate up 2/2 hypotension - Continue coumadin. Currently subtheraputic. Per pharmacy.  - Not a candidate for rate control due to contraindications to amio (ILD), dilt (LV dysfxn), digoxin (h/o bradycardia with use) - Palliative care consulted.  Will hopefully be able to contact family for [place] discussion.  Leukocytosis: WBC  15.7 (7/7).  Likely related to recent prednisone.  Afebrile.  - Continue to monitor  HTN: Currently hypotensive, SBP 80s-110 O/N.  At last hospitalization, baseline SBPs in 100s.  - Continue metoprolol as above - Will continue to monitor closely  Transaminitis: No h/o Liver disease.  AST 63, ALT 91 on admission (not elevated on previous admission) - Continue to monitor: 7/8 AST 368, ALT 340 - Ammonia level normal  Hx of Hypothyroidism  - Not on synthroid currently  - TSH from last admission was low at 0.237 however FT3 nl and FT4nl   Psych - Unclear diagnosis. Patient appears to have baseline dementia.  - Continue home medications: Cymbalta, Remeron, Invega   CAD, HLD  - Continue statin (lipitor substituted for crestor), BB   History of alcohol abuse Patient placed on CIWA protocol on previous admission. Had CIWA scores ranging from 0-14. Will continue to monitor on CIWA protocol.  Hyperglycemia  - Elevated on BMP  - A1c 7.0  - Given acute illness, will hold treatment currently   FEN/GI: Heart healthy diet; NS IV lock Prophylaxis: Coumadin Per Pharm  Disposition: SNF, pending clinical improvement and placement. PT recs 24h supervision (can return to ALF with assist).  Will f/u OT recs.  Palliative Care c/s for GOC discussion with family, appreciate recs.  Subjective:  Patient is demented.  Denies any complaints.  He does not talk much.  Objective: Temp:  [97.2 F (36.2 C)-97.8 F (36.6 C)] 97.8 F (36.6 C) (07/08 1432) Pulse Rate:  [126-132] 130 (07/08 1432) Resp:  [17-39] 39 (07/08 1432) BP: (95-114)/(64-81) 109/74 mmHg (07/08 1432) SpO2:  [93 %-96 %] 93 % (07/08 1432)  Physical Exam: General: Pleasant, demented, elderly man in NAD. Thin and frail. Pupils dilated, but reactive. No nystagmus  Heart: tachycardic no murmurs.  Lungs: Normal WOB. Rhonchi bilaterally Abdomen: Soft, non-tender, non-distended, BS + Extremities: No clubbing, cyanosis or  edema.  Laboratory:  Recent Labs Lab 07/26/13 0511 07/27/13 0445 07/28/13 0820  WBC 10.5 15.7* 14.8*  HGB 12.6* 13.4 13.9  HCT 39.1 40.5 41.2  PLT 161 168 156    Recent Labs Lab 07/26/13 0511 07/27/13 0445 07/28/13 1140  NA 138 136* 131*  K 4.7 5.1 6.2*  CL 99 97 94*  CO2 26 22 22   BUN 28* 32* 39*  CREATININE 1.03 1.05 1.04  CALCIUM 8.8 9.2 8.9  PROT 6.2 6.3 6.6  BILITOT 1.1 1.1 1.6*  ALKPHOS 92 87 129*  ALT 91* 95* 340*  AST 63* 56* 368*  GLUCOSE 184* 191* 213*    AM Cortisol 38.8 (7/6)  Ammonia 37  Imaging/Diagnostic Tests: CXR (7/5): The heart size and mediastinal contours are within normal limits. Both lungs are clear. The visualized skeletal structures are unremarkable. IMPRESSION: No active disease.  Echo (7/6) Study Conclusions - Left ventricle: Prominant apical trabeculations and false band. The cavity size was moderately dilated. Wall thickness was normal. The estimated ejection fraction was 15%. Diffuse hypokinesis. The study is not technically sufficient to allow evaluation of LV diastolic function. - Atrial septum: No defect or patent foramen ovale was identified. - Pulmonary arteries: PA peak pressure: 34 mm Hg (S).   Elberta Leatherwood, MD 07/28/2013, 3:39 PM PGY-1, [place] Intern pager: [phone], text pages welcome

## 2013-07-28 NOTE — Progress Notes (Addendum)
FMTS Attending Note  I personally saw and evaluated the patient. The plan of care was discussed with the resident team. I agree with the assessment and plan as documented by the resident.   1. Acute on chronic systolic CHF/ischemic cardiomyopathy - Echo on 7/6 identified severe systolic dysfunction with EF 15%, small improvement of dyspnea s/p Lasix, patient does not appear acutely volume overloaded, no longer on diuretics 2. COPD - maintaining SpO2 low 90's on RA, continue home breathing treatments 3. Chronic Atrial Flutter - no improvement of tachycardia with addition of Metoprolol 25 mg, BP can not tolerate additional antihypertensive medications, patient not a candidate for Diltiazem given hypotension/LV dysfunction and Amiodarone given lung disease, no further interventions planned from cardiac standpoint at this time 4. HTN - currently hypotensive likely due to severe systolic CHF, increase Metoprolol as BP allows  5. CAD - agree with continuation of home statin and BB  6. Encephalopathy - patient does not communicate well, oriented to self, unclear if history of dementia, unchanged from previous examinations in hospital 7. Anticoagulation per pharmacy, INR currently subtherapeutic  Disposition: Awaiting palliative care conversation with family regarding goals of care, patient will need SNF placement at time of discharge  Dossie Arbour MD

## 2013-07-29 ENCOUNTER — Inpatient Hospital Stay (HOSPITAL_COMMUNITY): Payer: PRIVATE HEALTH INSURANCE

## 2013-07-29 LAB — CBC
HCT: 41.3 % (ref 39.0–52.0)
Hemoglobin: 13.8 g/dL (ref 13.0–17.0)
MCH: 29.8 pg (ref 26.0–34.0)
MCHC: 33.4 g/dL (ref 30.0–36.0)
MCV: 89.2 fL (ref 78.0–100.0)
PLATELETS: 162 10*3/uL (ref 150–400)
RBC: 4.63 MIL/uL (ref 4.22–5.81)
RDW: 15.7 % — AB (ref 11.5–15.5)
WBC: 15.5 10*3/uL — ABNORMAL HIGH (ref 4.0–10.5)

## 2013-07-29 LAB — COMPREHENSIVE METABOLIC PANEL
ALBUMIN: 3 g/dL — AB (ref 3.5–5.2)
ALT: 400 U/L — AB (ref 0–53)
AST: 337 U/L — AB (ref 0–37)
Alkaline Phosphatase: 181 U/L — ABNORMAL HIGH (ref 39–117)
Anion gap: 14 (ref 5–15)
BUN: 43 mg/dL — ABNORMAL HIGH (ref 6–23)
CALCIUM: 9.1 mg/dL (ref 8.4–10.5)
CO2: 24 mEq/L (ref 19–32)
Chloride: 99 mEq/L (ref 96–112)
Creatinine, Ser: 1.18 mg/dL (ref 0.50–1.35)
GFR calc Af Amer: 69 mL/min — ABNORMAL LOW (ref >90)
GFR calc non Af Amer: 59 mL/min — ABNORMAL LOW (ref >90)
Glucose, Bld: 156 mg/dL — ABNORMAL HIGH (ref 70–99)
Potassium: 5.6 mEq/L — ABNORMAL HIGH (ref 3.7–5.3)
SODIUM: 137 meq/L (ref 137–147)
TOTAL PROTEIN: 6.3 g/dL (ref 6.0–8.3)
Total Bilirubin: 1.4 mg/dL — ABNORMAL HIGH (ref 0.3–1.2)

## 2013-07-29 LAB — LACTATE DEHYDROGENASE: LDH: 617 U/L — ABNORMAL HIGH (ref 94–250)

## 2013-07-29 LAB — PROTIME-INR
INR: 2.39 — ABNORMAL HIGH (ref 0.00–1.49)
PROTHROMBIN TIME: 26.1 s — AB (ref 11.6–15.2)

## 2013-07-29 LAB — GLUCOSE, CAPILLARY: GLUCOSE-CAPILLARY: 234 mg/dL — AB (ref 70–99)

## 2013-07-29 MED ORDER — WARFARIN SODIUM 2.5 MG PO TABS
2.5000 mg | ORAL_TABLET | Freq: Once | ORAL | Status: AC
  Administered 2013-07-29: 2.5 mg via ORAL
  Filled 2013-07-29: qty 1

## 2013-07-29 NOTE — Progress Notes (Signed)
Patient seen and examined and agree with note as outlined by Rosaria Ferries PA-C.  Nothing new to add.  Poor prognosis.  Treatment of HR limited by hypotension. Await Palliative Care recs.

## 2013-07-29 NOTE — Progress Notes (Signed)
Physical Therapy Treatment Patient Details Name: David [name] MRN: [id number] DOB: [birth year] Today's Date: 07/29/2013    History of Present Illness 73 y/o male with PMH COPD, chronic A flutter and ischemic cardiomyopathy that present with worsening moderate SOB; COPD exacerbation.    PT Comments    Attempted to see patient for re-evaluation for discharge needs as patient has demonstrated some decline in function over past 24-48 hours.  When entering the room patient was very lethargic but agreeable to participate. HR 130s with SpO2 mid 90s.  Performed some AAROM for bilateral LEs in preparation for activity. Attempted bed mobility, during attemp to come to EOB patient stated "I don't feel good" but could not discern what he was feeling. Patient was very diaphoretic at this time with beads of sweat visible on forehead and clammy arms/hands. Vitals assessed and nsg called to room. With nsg in room, continued to monitor vitals and assist with therapeutic re-positioning, during positioning patient was extremely lethargic and very limp/ unable to assist with movement. VSS except elevated HR which has been consistent for patient. Given decline in function and decreased activity tolerance today, do not feel patient is safe for return to ALF at this time (ALF was not providing physical assist PTA), if medical status and activity tolerance improves, recommend SNF for re-conditioning and safety with mobility; if continued decline, patient may need palliative care consult for [place] and therapy desires. Will follow as indicated.   Follow Up Recommendations  SNF (vs hospice pending [place])     Equipment Recommendations       Recommendations for Other Services       Precautions / Restrictions Restrictions Weight Bearing Restrictions: No    Mobility  Bed Mobility Overal bed mobility: Needs Assistance Bed Mobility: Rolling;Supine to Sit Rolling: Max assist   Supine to sit: Max assist     General bed  mobility comments: patient required Maximal assist for rolling and to attempt to come to EOB, during attempt to come to EOB patient stated "I don't feel well" and layed down in fetal position on right side with minimal responsiveness.   Transfers                 General transfer comment: did not attempt  Ambulation/Gait                 Stairs            Wheelchair Mobility    Modified Rankin (Stroke Patients Only)       Balance                                    Cognition Arousal/Alertness: Lethargic Behavior During Therapy: WFL for tasks assessed/performed Overall Cognitive Status: History of cognitive impairments - at baseline                      Exercises      General Comments        Pertinent Vitals/Pain HR elevated 130s at rest, all other VSS, + diaphoresis and pt symptomatic with lethargy and stating "i do not feel well"    Home Living                      Prior Function            PT Goals (current goals can now be found in the care plan section)  Acute Rehab PT Goals Patient Stated Goal: [facility] to [facility] PT Goal Formulation: With patient Time For Goal Achievement: 08/09/13 Potential to Achieve Goals: Fair Progress towards PT goals: Not progressing toward goals - comment    Frequency  Min 3X/week    PT Plan Current plan needs to be updated    Co-evaluation             End of Session   Activity Tolerance: Patient limited by fatigue;Patient limited by lethargy Patient left: in bed;with call bell/phone within reach;with bed alarm set     Time: 1421-1450 PT Time Calculation (min): 29 min  Charges:  $Therapeutic Activity: 23-37 mins                    G CodesDuncan Dull 2013-08-20, 4:26 PM [name], [place] DPT  [phone]

## 2013-07-29 NOTE — Progress Notes (Signed)
ANTICOAGULATION CONSULT NOTE - Follow up [place] for Coumadin Indication: atrial flutter  Allergies  Allergen Reactions  . Ace Inhibitors   . Oat Flour [Oat]     Unknown per MAR  . Other     Greens: unknown reaction per [facility]    Patient Measurements: Height: 6\' 1"  (185.4 cm) Weight: 146 lb 6.2 oz (66.4 kg) IBW/kg (Calculated) : 79.9  Vital Signs: Temp: 98 F (36.7 C) (07/09 0704) Temp src: Oral (07/09 0704) BP: 109/67 mmHg (07/09 0706) Pulse Rate: 132 (07/09 0305)  Labs:  Recent Labs  07/27/13 0445 07/28/13 0820 07/28/13 1140 07/28/13 1900 07/29/13 0303  HGB 13.4 13.9  --   --  13.8  HCT 40.5 41.2  --   --  41.3  PLT 168 156  --   --  162  LABPROT 28.2* 30.4*  --   --  26.1*  INR 2.64* 2.91*  --   --  2.39*  CREATININE 1.05  --  1.04 1.18 1.18    Estimated Creatinine Clearance: 52.4 ml/min (by C-G formula based on Cr of 1.18).   Medical History: Past Medical History  Diagnosis Date  . Community acquired pneumonia     Right middle lobe community-acquired pneumonia  . Toxic metabolic encephalopathy   . Chronic atrial flutter     a. rate controlled with bb therapy;  b. h/o bradycardia when on digoxin 12/2011.  . Tobacco abuse   . History of coronary artery disease 2008    a. Cath at [facility]: RCA 95%, RI 60%  - medical therapy  . Ischemic cardiomyopathy     a. prev documented EF of 20-30%;  b. 09/2012 Echo: EF 40-45%, diff HK, mild MR, mild bi-atrial enlargement.  . History of prostate cancer   . Chronic obstructive pulmonary disease     with mild bronchospasm, improved  . Hypertension   . History of alcohol abuse     History of prior heavy alcohol abuse  . History of depression   . Altered mental status 2012   . Noncompliance   . Depression   . Ulcer   . Hx of migraines   . Hyperlipidemia   . Colon cancer     Assessment: 73yo male c/o SOB x3d that became acutely worse PTA, rec'd neb by EMS which helped Sx, to continue  Coumadin during admission for Aflutter; last Coumadin dose PTA 7/5. INR remains therapeutic but has trended down today as expected. Of note, LFTs have risen acutely since yesterday. H/H and Plt wnl   Home Coumadin dose: 5 mg daily   Goal of Therapy:  INR 2-3   Plan:  1) Coumadin 2.5 mg x 1 dose  2) Monitor daily PT/INR  3) F/u palliative care consult     Albertina Parr, PharmD.  Clinical Pharmacist Pager [phone]

## 2013-07-29 NOTE — Progress Notes (Signed)
Called to pt's room due to him "not feeling well".  Compared to earlier in the day, his vitals are stable, his exam is unchanged, his EKG is unchanged, and he is not hypoglycemic.  Will continue to monitor.  Upon further discussion with nursing staff who was in contact with ethics committee, we will call the niece tomorrow, who is the power of attorney, one last time to leave message that we will be making the pt's medical decisions in the best interest of his medical situation.  The palliative care team has been trying to reach [place], the pt's niece and power of attorney, for the last several days, and our team has been trying to reach her as well.  If pt's niece is not available tomorrow, we will leave message with her stating that we will proceed with hospice, which both cardiology and palliative along with our team believe is in this pt's best interest.    Gaspar Bidding R. Awanda Mink, DO of [facility] 07/29/2013, 3:23 PM

## 2013-07-29 NOTE — Progress Notes (Signed)
Inpatient Diabetes Program Recommendations  AACE/ADA: New Consensus Statement on Inpatient Glycemic Control (2013)  Target Ranges:  Prepandial:   less than 140 mg/dL      Peak postprandial:   less than 180 mg/dL (1-2 hours)      Critically ill patients:  140 - 180 mg/dL   Reason for Visit: Hyperglycemia  No hx DM. HgbA1C - 7.0%  Poor po intake. Palliative care involvement.  If CBGs remain >200 mg/dL, consider addition of Novolog 3 units tidwc.  Will continue to follow. Thank you. Lorenda Peck, RD, LDN, CDE Inpatient Diabetes Coordinator [phone]

## 2013-07-29 NOTE — Clinical Social Work Note (Signed)
CSW attempted to reach David [name], as CSW was able to speak with by phone yesterday afternoon. [name] did not answer and CSW had to leave message for her. CSW explained in message that it's very important that David [name] return calls from treatment team so that the patient's care can be discussed.   Liz Beach MSW, [place], [place], [phone]

## 2013-07-29 NOTE — Progress Notes (Signed)
Family Medicine Teaching Service Daily Progress Note Intern Pager: [phone]  Patient name: David [name] Medical record number: [id number] Date of birth: 1940/09/05 Age: 73 y.o. Gender: male  Primary Care Provider: PROVIDER NOT IN SYSTEM Consultants: Cardiology/HF, Palliative Care Code Status: DNR  Pt Overview and Major Events to Date:  7/6 Echo LV EF 15% (previously 40-45% in 2014), mod LA dilation, diffuse hypokinesis   Assessment and Plan:  David [name] is a 73 y.o. male presenting with acute dyspnea. PMH is significant for chronic systolic CHF (EF 60-73%; Echo 08/2012), Atrial flutter on coumadin, HTN, COPD w/ emphysema, CAD.   Chronic systolic CHF/ischemic CM- ProBNP of 21K on admission. Chest xray clear. Given IV Lasix x1: neg 1.2L, weight down 5lbs.  - Echo (7/6) with EF 15% (down from 40-45% in 08/2012) , mod LA dilation and diffuse hypokinesis - BP too low to consider ACEi/ARB.  - Continue Metoprolol 25 BID - Cardiology/HF c/s, appreciate recs.   Acute COPD exacerbation - Pt was recently admitted for COPD exacerbation and needed Bipap, discharged 7/2. Patient was treated as a COPD exacerbation this visit in the ED, d/c Levaquin and Prednisone 7/6.  Chest Xray unremarkable. Dyspnea improved. - Supplemental O2 as needed (breathing comfortably on RA since admission) - Scheduled Duonebs BID, albuterol Q2PRN - Continue home QVAR (flovent substituted)  Chronic atrial flutter. s/p ablation and cardioversion in 2008 and not a candidate for further treatment. Goal HR 110 (130s O/N) - Continue Metoprolol 25mg  BID.  Cannot titrate up 2/2 hypotension - Continue coumadin. Per pharmacy.  - Not a candidate for rate control due to contraindications to amio (ILD), dilt (LV dysfxn), digoxin (h/o bradycardia with use) - Palliative care consulted.  Will hopefully be able to contact family for Juda discussion.  Leukocytosis: WBC 15.7 (7/7) > 15.5 Today.  Thought to be related to recent prednisone.   Afebrile.  - f/u CXR, UA  HTN: Currently hypotensive, SBP 80s-110 O/N.  At last hospitalization, baseline SBPs in 100s.  - Continue metoprolol as above - Will continue to monitor closely  Transaminitis: No h/o Liver disease, consistent with shock liver.  AST 620 029 1254, ALT 91>340>369>400 (not elevated on previous admission) TBili 1.4 - Continue to monitor - Ammonia level normal  Hyperkalemia: K 5.6, down from 6.2 yesterday. Contiue to monitor  Hx of Hypothyroidism : Not on synthroid currently  - TSH from last admission was low at 0.237 however FT3 nl and FT4nl   Psych - Unclear diagnosis. Patient appears to have baseline dementia.  - Continue home medications: Cymbalta, Remeron, Invega   CAD, HLD  - Continue statin (lipitor substituted for crestor), BB   History of alcohol abuse Patient placed on CIWA protocol on previous admission. Had CIWA scores ranging from 0-14. Will continue to monitor on CIWA protocol.  Hyperglycemia  - Elevated on BMP  - A1c 7.0  - Given acute illness, will hold treatment currently   FEN/GI: Heart healthy diet; NS IV lock Prophylaxis: Coumadin Per Pharm  Disposition: SNF, pending clinical improvement and placement. PT recs 24h supervision (can return to ALF with assist).  Will f/u OT recs.  Palliative Care c/s for GOC discussion with family, appreciate recs.  Ethics consult today as cannot contact family.  Subjective:  Patient is demented.  Denies any complaints.  More alert and talkative this AM.  Objective: Temp:  [97.3 F (36.3 C)-98.1 F (36.7 C)] 97.4 F (36.3 C) (07/09 0257) Pulse Rate:  [130-133] 132 (07/09 0305) Resp:  [16-39] 16 (07/09 0305)  BP: (83-110)/(52-87) 95/75 mmHg (07/09 0305) SpO2:  [93 %-98 %] 93 % (07/09 0305) Weight:  [146 lb 6.2 oz (66.4 kg)] 146 lb 6.2 oz (66.4 kg) (07/09 0300) Physical Exam: General: Pleasant, demented, elderly man in NAD. Thin and frail. Pupils dilated, but reactive. No nystagmus  Heart:  tachycardic no murmurs.  Lungs: Normal WOB. Rhonchi bilaterally Abdomen: Soft, non-tender, non-distended, BS + Extremities: No clubbing, cyanosis or edema.  Laboratory:  Recent Labs Lab 07/27/13 0445 07/28/13 0820 07/29/13 0303  WBC 15.7* 14.8* 15.5*  HGB 13.4 13.9 13.8  HCT 40.5 41.2 41.3  PLT 168 156 162    Recent Labs Lab 07/28/13 1140 07/28/13 1900 07/29/13 0303  NA 131* 135* 137  K 6.2* 5.3 5.6*  CL 94* 96 99  CO2 22 23 24   BUN 39* 41* 43*  CREATININE 1.04 1.18 1.18  CALCIUM 8.9 8.9 9.1  PROT 6.6 6.3 6.3  BILITOT 1.6* 1.2 1.4*  ALKPHOS 129* 194* 181*  ALT 340* 369* 400*  AST 368* 409* 337*  GLUCOSE 213* 245* 156*    AM Cortisol 38.8 (7/6)  Ammonia 37  Imaging/Diagnostic Tests: CXR (7/5): The heart size and mediastinal contours are within normal limits. Both lungs are clear. The visualized skeletal structures are unremarkable. IMPRESSION: No active disease.  Echo (7/6) Study Conclusions - Left ventricle: Prominant apical trabeculations and false band. The cavity size was moderately dilated. Wall thickness was normal. The estimated ejection fraction was 15%. Diffuse hypokinesis. The study is not technically sufficient to allow evaluation of LV diastolic function. - Atrial septum: No defect or patent foramen ovale was identified. - Pulmonary arteries: PA peak pressure: 34 mm Hg (S).   [name], MD 07/29/2013, 7:08 AM PGY-1, [place] Intern pager: [phone], text pages welcome

## 2013-07-29 NOTE — Progress Notes (Signed)
FMTS Attending Note  I personally saw and evaluated the patient. The plan of care was discussed with the resident team. I agree with the assessment and plan as documented by the resident.   1. Acute on chronic systolic CHF/ischemic cardiomyopathy - Echo on 7/6 identified severe systolic dysfunction with EF 15%, small improvement of dyspnea s/p Lasix, patient does not appear acutely volume overloaded, no longer on diuretics, appreciate cardiology input 2. COPD - maintaining SpO2 low 90's on RA, continue home breathing treatments  3. Chronic Atrial Flutter - no improvement of tachycardia with addition of Metoprolol 25 mg, BP can not tolerate additional antihypertensive medications, patient not a candidate for Diltiazem given hypotension/LV dysfunction and Amiodarone given lung disease, no further interventions planned from cardiac standpoint at this time  4. HTN - currently hypotensive likely due to severe systolic CHF, increase Metoprolol as BP allows  5. CAD - agree with continuation of home statin and BB  6. Encephalopathy - patient does not communicate well, oriented to self, unclear if history of dementia, unchanged from previous examinations in hospital  7. Anticoagulation per pharmacy, INR currently therapeutic  Disposition: SW and FM team still unable to reach POA, if no response by 07/30/13 will pursue SNF/Hospice.  Dossie Arbour MD

## 2013-07-29 NOTE — Progress Notes (Signed)
Patient Name: David [name] Date of Encounter: 07/29/2013  Active Problems:   Dyspnea    Patient Profile: David [name] is a 73 yo man with PMH of CAD with LHC '08 with 95% RCA and 60% Ramus treated medically, ischemic cardiomyopathy with EF 40-45% '14, COPD and chronic ILD with chronic respiratory failure, atrial flutter (chronically) on warfarin, hypothyroidism, prior heavy tobacco/alcohol use who lives in a nursing home, d/c 06/30 for SOB, admitted 07/06 with acute dyspnea. ECHO on this admission with EF 15%.    SUBJECTIVE: Pt responds to verbal, says breathing OK.  OBJECTIVE Filed Vitals:   07/29/13 0305 07/29/13 0704 07/29/13 0706 07/29/13 0845  BP: 95/75  109/67   Pulse: 132     Temp:  98 F (36.7 C)    TempSrc:  Oral    Resp: 16     Height:      Weight:      SpO2: 93%   94%    Intake/Output Summary (Last 24 hours) at 07/29/13 0927 Last data filed at 07/29/13 0729  Gross per 24 hour  Intake     60 ml  Output    750 ml  Net   -690 ml   Filed Weights   07/25/13 2154 07/26/13 0355 07/29/13 0300  Weight: 148 lb (67.132 kg) 143 lb 8.3 oz (65.1 kg) 146 lb 6.2 oz (66.4 kg)    PHYSICAL EXAM General: Well developed, well nourished, male in no acute distress. Head: Normocephalic, atraumatic.  Neck: Supple without bruits, JVD elevated. Lungs:  Resp regular and unlabored, coarse rhonchi, upper airway especially. Heart: rapid, basically regular, S1, S2, no S3, S4, or murmur; no rub. Abdomen: Soft, non-tender, non-distended, BS + x 4.  Extremities: No clubbing, cyanosis, no edema.  Neuro: Alert and oriented X 1.   LABS: CBC: Recent Labs  07/28/13 0820 07/29/13 0303  WBC 14.8* 15.5*  HGB 13.9 13.8  HCT 41.2 41.3  MCV 89.8 89.2  PLT 156 162   INR: Recent Labs  07/29/13 0303  INR 0.93*   Basic Metabolic Panel: Recent Labs  07/28/13 1900 07/29/13 0303  NA 135* 137  K 5.3 5.6*  CL 96 99  CO2 23 24  GLUCOSE 245* 156*  BUN 41* 43*  CREATININE 1.18  1.18  CALCIUM 8.9 9.1   Liver Function Tests: Recent Labs  07/28/13 1900 07/29/13 0303  AST 409* 337*  ALT 369* 400*  ALKPHOS 194* 181*  BILITOT 1.2 1.4*  PROT 6.3 6.3  ALBUMIN 2.9* 3.0*   BNP: Pro B Natriuretic peptide (BNP)  Date/Time Value Ref Range Status  07/25/2013 10:02 PM 21407.0* 0 - 125 pg/mL Final  07/17/2013  3:49 PM 5636.0* 0 - 125 pg/mL Final    TELE:  Atrial flutter, RVR      Current Medications:  . antiseptic oral rinse  15 mL Mouth Rinse q12n4p  . atorvastatin  40 mg Oral q1800  . chlorhexidine  15 mL Mouth Rinse BID  . DULoxetine  60 mg Oral Daily  . fluticasone  1 puff Inhalation BID  . folic acid  1 mg Oral Daily  . guaifenesin  300 mg Oral Q6H  . ipratropium-albuterol  3 mL Nebulization BID  . lactulose  20 g Oral Daily  . metoprolol tartrate  25 mg Oral BID  . mirtazapine  15 mg Oral QHS  . multivitamin with minerals  1 tablet Oral Daily  . paliperidone  6 mg Oral Daily  . sodium chloride  3 mL Intravenous Q12H  . thiamine  100 mg Oral Daily   Or  . thiamine  100 mg Intravenous Daily  . tiotropium  18 mcg Inhalation Daily  . Warfarin - Pharmacist Dosing Inpatient   Does not apply q1800      ASSESSMENT AND PLAN: David [name] is a 73 y.o. male with a history of CAD s/p LHC RCA 95%, RI 60% - rx therapy, COPD, ischemic cardiomyopathy (EF 40-45% ECHO 2014), HTN, hypothyroidism, chronic atrial flutter on coumadin, and heavy alcohol use/tobacco abuse who was brought in from the nursing home to [facility] on 07/26/13 for acute dyspnea.He is being treated for an acute COPD exacerbation in the setting of chronic ILD and chronic respiratory failure as well as acute CHF. He is chronically in atrial flutter, but had had some RVR and cardiology has been consulted for help in rate control.   Chronic atrial flutter. Recurrent after DCCV -- 09/2012 hospitalization he had RVR. No dig due to bradycardia -- Controlled as an outpatient on metoprolol 25mg  mg BID only. Cannot  up-titrate due to hypotension  -- Continue coumadin per pharmacy.  -- Currently HR 110-130 bmp. No dilt in the setting of LV dysfunction and no amiodarone with lung disease. Monitor as the patient has poor functional status and hypotension. Palliative care consulted.  -- Not a candidate for a pacemaker. No good options at this time for rate control. Will await Palliative Care consult.   Chronic systolic CHF/ischemic CM- ProBNP of 21K plus on admit. Chest xray clear. Given IV Lasix: neg 1.6L, weight down 2lbs from admit.  -- Echo on this admission with EF 15%, mod LA dilation and diffuse hypokinesis. This is significantly decreased from 09/20/12 EF 40 to 45%, mild MR.  -- NO ACE inhibitor or ARB due to low BP -- Continue BB.  -- Off IV Lasix limited but BUN still increasing and PAS only 34 on echo, feel he has chronically low output. BP too low for afterload reducers and aflutter doesn't help.  Acute COPD exacerbation  -- Continue tx per IM   History of alcohol abuse. Possibe withdrawing.  -- per IM -- Monitor for DTs.   CAD- Cath at [facility]: RCA 95%, RI 60% - medical therapy  -- No objective signs of ischemia. No chest pain.  -- Continue statin, BB. Not on ASA due to coumadin use.   HTN  --  SBPs in 80s at one point, currently 90s-110, per IM -- Will continue to monitor closely   [place] trying to contact family for goals of care.     Jonetta Speak , PA-C 9:27 AM 07/29/2013

## 2013-07-29 NOTE — Progress Notes (Addendum)
07/28/13 2317  Vitals  Temp 98.1 F (36.7 C)  Temp src Oral  BP ! 83/62 mmHg  BP Location Right arm  BP Method Automatic  Patient Position (if appropriate) Lying  Pulse Rate ! 130  Pulse Rate Source Monitor  Cardiac Rhythm Atrial flutter  Resp ! 25  paged Dr. Eligha Bridegroom to notify of BP/ and HR no new orders received will continue to monitor

## 2013-07-29 NOTE — Clinical Social Work Note (Signed)
CSW spoke with patient's ALF, they do not offer a locked unit or "memory care". Facility does state however that patient is welcome to return to their facility. Facility states that if patient requires hospice services, the patient can receive these at [facility] as well. MD stated that patient needed SNF, but PT is not currently recommending that level of care. CSW made SNF referrals. Facilities are stating that they can offer patient a SNF bed if the patient's insurance approves him for SNF. The patient's insurance company will not approve patient for SNF with a PT recommendation for return to ALF. CSW has updated [name] as of yesterday on which facilities would be willing to offer the patient a bed (pending insurance authorization) and has asked her to have her decision by today in the event the patient needs SNF at discharge.   Liz Beach MSW, [place], [place], [phone]

## 2013-07-30 DIAGNOSIS — I2510 Atherosclerotic heart disease of native coronary artery without angina pectoris: Secondary | ICD-10-CM

## 2013-07-30 DIAGNOSIS — Z515 Encounter for palliative care: Secondary | ICD-10-CM

## 2013-07-30 DIAGNOSIS — R0609 Other forms of dyspnea: Secondary | ICD-10-CM

## 2013-07-30 DIAGNOSIS — R0989 Other specified symptoms and signs involving the circulatory and respiratory systems: Secondary | ICD-10-CM

## 2013-07-30 DIAGNOSIS — J441 Chronic obstructive pulmonary disease with (acute) exacerbation: Principal | ICD-10-CM

## 2013-07-30 DIAGNOSIS — J690 Pneumonitis due to inhalation of food and vomit: Secondary | ICD-10-CM | POA: Diagnosis not present

## 2013-07-30 DIAGNOSIS — I2584 Coronary atherosclerosis due to calcified coronary lesion: Secondary | ICD-10-CM

## 2013-07-30 LAB — COMPREHENSIVE METABOLIC PANEL
ALK PHOS: 206 U/L — AB (ref 39–117)
ALT: 511 U/L — ABNORMAL HIGH (ref 0–53)
AST: 354 U/L — AB (ref 0–37)
Albumin: 3 g/dL — ABNORMAL LOW (ref 3.5–5.2)
Anion gap: 14 (ref 5–15)
BUN: 50 mg/dL — ABNORMAL HIGH (ref 6–23)
CHLORIDE: 100 meq/L (ref 96–112)
CO2: 24 meq/L (ref 19–32)
Calcium: 9.1 mg/dL (ref 8.4–10.5)
Creatinine, Ser: 1.19 mg/dL (ref 0.50–1.35)
GFR, EST AFRICAN AMERICAN: 68 mL/min — AB (ref >90)
GFR, EST NON AFRICAN AMERICAN: 59 mL/min — AB (ref >90)
GLUCOSE: 160 mg/dL — AB (ref 70–99)
Potassium: 5.8 mEq/L — ABNORMAL HIGH (ref 3.7–5.3)
SODIUM: 138 meq/L (ref 137–147)
Total Bilirubin: 1.4 mg/dL — ABNORMAL HIGH (ref 0.3–1.2)
Total Protein: 6.3 g/dL (ref 6.0–8.3)

## 2013-07-30 LAB — CBC
HCT: 39.8 % (ref 39.0–52.0)
HEMOGLOBIN: 13.2 g/dL (ref 13.0–17.0)
MCH: 29.8 pg (ref 26.0–34.0)
MCHC: 33.2 g/dL (ref 30.0–36.0)
MCV: 89.8 fL (ref 78.0–100.0)
PLATELETS: 148 10*3/uL — AB (ref 150–400)
RBC: 4.43 MIL/uL (ref 4.22–5.81)
RDW: 16 % — ABNORMAL HIGH (ref 11.5–15.5)
WBC: 17.5 10*3/uL — AB (ref 4.0–10.5)

## 2013-07-30 LAB — PROTIME-INR
INR: 2.01 — AB (ref 0.00–1.49)
PROTHROMBIN TIME: 22.8 s — AB (ref 11.6–15.2)

## 2013-07-30 MED ORDER — PIPERACILLIN-TAZOBACTAM 3.375 G IVPB
3.3750 g | Freq: Three times a day (TID) | INTRAVENOUS | Status: DC
  Administered 2013-07-30 – 2013-08-01 (×5): 3.375 g via INTRAVENOUS
  Filled 2013-07-30 (×8): qty 50

## 2013-07-30 MED ORDER — DEXTROSE-NACL 5-0.45 % IV SOLN
INTRAVENOUS | Status: AC
  Administered 2013-07-30: 11:00:00 via INTRAVENOUS

## 2013-07-30 MED ORDER — VANCOMYCIN HCL 500 MG IV SOLR
500.0000 mg | Freq: Two times a day (BID) | INTRAVENOUS | Status: DC
  Administered 2013-07-30 – 2013-07-31 (×4): 500 mg via INTRAVENOUS
  Filled 2013-07-30 (×6): qty 500

## 2013-07-30 MED ORDER — WARFARIN SODIUM 5 MG PO TABS
5.0000 mg | ORAL_TABLET | Freq: Once | ORAL | Status: AC
  Administered 2013-07-30: 5 mg via ORAL
  Filled 2013-07-30 (×3): qty 1

## 2013-07-30 NOTE — Evaluation (Signed)
Clinical/Bedside Swallow Evaluation Patient Details  Name: David [name] MRN: [id number] Date of Birth: Jul 10, 1940  Today's Date: 07/30/2013 Time: 1200-1220 SLP Time Calculation (min): 20 min  Past Medical History:  Past Medical History  Diagnosis Date  . Community acquired pneumonia     Right middle lobe community-acquired pneumonia  . Toxic metabolic encephalopathy   . Chronic David [name]     a. rate controlled with bb therapy;  b. h/o bradycardia when on digoxin 12/2011.  . Tobacco abuse   . History of coronary artery disease 2008    a. Cath at [facility]: RCA 95%, RI 60%  - medical therapy  . Ischemic cardiomyopathy     a. prev documented EF of 20-30%;  b. 09/2012 Echo: EF 40-45%, diff HK, mild MR, mild bi-David enlargement.  . History of prostate cancer   . Chronic obstructive pulmonary disease     with mild bronchospasm, improved  . Hypertension   . History of alcohol abuse     History of prior heavy alcohol abuse  . History of depression   . Altered mental status 2012   . Noncompliance   . Depression   . Ulcer   . Hx of migraines   . Hyperlipidemia   . Colon cancer    Past Surgical History:  Past Surgical History  Procedure Laterality Date  . Appendectomy     HPI:  David [name] is a 73 y.o. [name] presenting with acute [name]. PMH is significant for chronic systolic CHF (EF 25-42%; Echo 08/2012), David [name], HTN, COPD w/ emphysema, CAD.  Prio MBS recommended dys 3 (mechanical soft) diet with thin liquids with intermittent trace penetration, cleared with a cued throat clear.    Assessment / Plan / Recommendation Clinical Impression  Pt demonstrates severely impaired oral hygiene with red blistered labial and lingual mucosa, very dry. SLP provided oral care and pt was able to accept trials of water and puree with similar function seen during his last admission.  Pt unable to attempt mastication of solids. Despite no direct evidence of  aspiration, pt is at high risk due to decreased metnation and weak cough. He was shown on his last MBS to have trace penetration to the cords with thin liquids, needing an intermittent throat clear to expel penetrates. Judging from the state of pts oral health, suspect that aspiration precautions and swallow strategies were not followed. Will resume diet and precautions during acute stay with moderate risk of aspiration as pt transitions to comfort care.     Aspiration Risk  Moderate    Diet Recommendation Dysphagia 1 (Puree);Thin liquid   Liquid Administration via: Cup;No straw Medication Administration: Crushed with puree Supervision: Staff to assist with self feeding Compensations: Slow rate;Small sips/bites;Clear throat intermittently Postural Changes and/or Swallow Maneuvers: Seated upright 90 degrees    Other  Recommendations Oral Care Recommendations: Oral care BID   Follow Up Recommendations  Skilled Nursing facility    Frequency and Duration min 2x/week  1 week   Pertinent Vitals/Pain NA    SLP Swallow Goals     Swallow Study Prior Functional Status       General HPI: David [name] is a 73 y.o. [name] presenting with acute [name]. PMH is significant for chronic systolic CHF (EF 70-62%; Echo 08/2012), David [name], HTN, COPD w/ emphysema, CAD.  Prio MBS recommended dys 3 (mechanical soft) diet with thin liquids with intermittent trace penetration, cleared with a cued throat clear.  Type of Study: Bedside  swallow evaluation Previous Swallow Assessment: 12/24/11 BSE/MBS dys 3/thin  MBS 6/30 - Dys 3/thin  Diet Prior to this Study: NPO Temperature Spikes Noted: No Respiratory Status: Nasal cannula History of Recent Intubation: No Behavior/Cognition: Alert;Cooperative Oral Cavity - Dentition: Poor condition;Missing dentition Patient Positioning: Upright in bed Baseline Vocal Quality: Hoarse;Low vocal intensity Volitional Cough: Wet;Congested Volitional  Swallow: Able to elicit    Oral/Motor/Sensory Function Overall Oral Motor/Sensory Function: Appears within functional limits for tasks assessed   Ice Chips     Thin Liquid Thin Liquid: Impaired Presentation: Cup Pharyngeal  Phase Impairments: Suspected delayed Swallow;Decreased hyoid-laryngeal movement    Nectar Thick Nectar Thick Liquid: Not tested   Honey Thick Honey Thick Liquid: Not tested   Puree Puree: Impaired Presentation: Spoon Pharyngeal Phase Impairments: Decreased hyoid-laryngeal movement;Suspected delayed Swallow;Change in Vital Signs;Cough - Immediate   Solid   GO    Solid:  (pt could not masticate)      David Baltimore, MA CCC-SLP [phone]  David [name], David [name] 07/30/2013,1:37 PM

## 2013-07-30 NOTE — Consult Note (Signed)
Patient HQ:IONGEX David [name]      DOB: 09/02/1940      BMW:[id number]     Consult Note from the Palliative Medicine Team at [place] Requested by: Dr. Brita Romp   PCP: PROVIDER NOT [place] Reason for Consultation: GOC and options   Phone Number:None  Assessment of patients Current state: 73 yo male with systolic CHF (EF 27%), CAD, atrial fibrillation (not a good candidate for pacemaker and medication management limited by hypotension), COPD, dementia, pneumonia (likely aspiration related), h/o alcohol abuse.   I had the opportunity to speak with David [name], Mr. David [name]'s niece. We discussed his overall poor health status and prognosis with his extensive heart disease (EF 15%, atrial fibrillation, CAD). We discussed that cardiology does not feel he is a good candidate for pacemaker and that he should focus on his comfort - I agree with this recommendation. David [name] understands that his atrial fibrillation is placing more stress on his already weakened cardiac state. We also discussed his COPD and dementia and possible aspiration as further complications to his overall health. David [name] is open to options for hospice and comfort care but she would also like to discuss with his children to give them the opportunity to be involved in decision making if they are willing to do so. According to David [name] his children have not been involved in his life or care and have not been willing to make decisions for his care and that this has fallen to her so far. David [name]'s phone number is the only number on file in our system and also with [facility] (I called them to check). She would also like to consult other family members as she has not seen him in ~1 month. She did tell me that she did not want him to go back to [facility] and feels he needs SNF level of care followed by palliative.   David [name] says that she works [employer] but should be available today and tomorrow to be reached at [phone]. I made a follow-up call  after speaking with David [name] about concern for aspiration pneumonia and updated David [name]. She wishes to continue current level of care with IVF, antibiotics, labs until she can speak with family about pursuing more comfort measures. She says she will call them today.     Goals of Care: 1.  Code Status: DNR   2. Scope of Treatment: 1. Vital Signs: as needed 2. Respiratory/Oxygen: yes 3. Antibiotics: yes 4. Blood Products: yes 5. IVF: yes 6. Review of Medications to be discontinued: none 7. Labs: yes 8. Telemetry: yes   4. Disposition: SNF with palliative to follow   3. Symptom Management:   1. Bowel Regimen: Miralax prn.  2. Nausea/Vomiting: Ondansetron prn.  3. Aspiration: SLP eval and diet recommendations.  4. Weakness: Continue medical management. PT following.   4. Psychosocial: Emotional support provided to patient and family.    Brief HPI: 73 yo male with systolic CHF (EF 74%), CAD, atrial fibrillation with limited treatment options. Also has worsening dementia and recurrent aspiration pneumonia.    ROS: Denies pain, nausea, anxiety. Very pleasant but confused.    PMH:  Past Medical History  Diagnosis Date  . Community acquired pneumonia     Right middle lobe community-acquired pneumonia  . Toxic metabolic encephalopathy   . Chronic atrial flutter     a. rate controlled with bb therapy;  b. h/o bradycardia when on digoxin 12/2011.  . Tobacco abuse   . History  of coronary artery disease 2008    a. Cath at [facility]: RCA 95%, RI 60%  - medical therapy  . Ischemic cardiomyopathy     a. prev documented EF of 20-30%;  b. 09/2012 Echo: EF 40-45%, diff HK, mild MR, mild bi-atrial enlargement.  . History of prostate cancer   . Chronic obstructive pulmonary disease     with mild bronchospasm, improved  . Hypertension   . History of alcohol abuse     History of prior heavy alcohol abuse  . History of depression   . Altered mental status 2012   . Noncompliance    . Depression   . Ulcer   . Hx of migraines   . Hyperlipidemia   . Colon cancer      PSH: Past Surgical History  Procedure Laterality Date  . Appendectomy     I have reviewed the FH and SH and  If appropriate update it with new information. Allergies  Allergen Reactions  . Ace Inhibitors   . Oat Flour [Oat]     Unknown per MAR  . Other     Greens: unknown reaction per [facility]   Scheduled Meds: . antiseptic oral rinse  15 mL Mouth Rinse q12n4p  . chlorhexidine  15 mL Mouth Rinse BID  . DULoxetine  60 mg Oral Daily  . fluticasone  1 puff Inhalation BID  . folic acid  1 mg Oral Daily  . guaifenesin  300 mg Oral Q6H  . ipratropium-albuterol  3 mL Nebulization BID  . lactulose  20 g Oral Daily  . metoprolol tartrate  25 mg Oral BID  . mirtazapine  15 mg Oral QHS  . multivitamin with minerals  1 tablet Oral Daily  . paliperidone  6 mg Oral Daily  . piperacillin-tazobactam (ZOSYN)  IV  3.375 g Intravenous Q8H  . sodium chloride  3 mL Intravenous Q12H  . thiamine  100 mg Oral Daily   Or  . thiamine  100 mg Intravenous Daily  . tiotropium  18 mcg Inhalation Daily  . vancomycin  500 mg Intravenous Q12H  . warfarin  5 mg Oral ONCE-1800  . Warfarin - Pharmacist Dosing Inpatient   Does not apply q1800   Continuous Infusions: . dextrose 5 % and 0.45% NaCl     PRN Meds:.albuterol, ondansetron (ZOFRAN) IV, ondansetron, polyethylene glycol    BP 94/72  Pulse 131  Temp(Src) 98.1 F (36.7 C) (Axillary)  Resp 34  Ht 6\' 1"  (1.854 m)  Wt 64.6 kg (142 lb 6.7 oz)  BMI 18.79 kg/m2  SpO2 95%   PPS: 30%   Intake/Output Summary (Last 24 hours) at 07/30/13 1039 Last data filed at 07/30/13 0729  Gross per 24 hour  Intake    240 ml  Output    625 ml  Net   -385 ml    Physical Exam:  General: NAD, pleasant, confused, thin, frail HEENT: [place]/AT, no JVD, moist mucous membranes without exudate Chest: Rhonchi throughout, no labored breathing, symmetric CVS: Tachycardic, atrial  fibrillation  Abdomen: Soft, NT, ND, +BS Ext: MAE, no edema, warm to touch Neuro: Confused, oriented to person, follows commands  Labs: CBC    Component Value Date/Time   WBC 17.5* 07/30/2013 0354   RBC 4.43 07/30/2013 0354   HGB 13.2 07/30/2013 0354   HCT 39.8 07/30/2013 0354   PLT 148* 07/30/2013 0354   MCV 89.8 07/30/2013 0354   MCH 29.8 07/30/2013 0354   MCHC 33.2 07/30/2013 0354  RDW 16.0* 07/30/2013 0354   LYMPHSABS 1.1 07/17/2013 1549   MONOABS 1.1* 07/17/2013 1549   EOSABS 0.0 07/17/2013 1549   BASOSABS 0.0 07/17/2013 1549    BMET    Component Value Date/Time   NA 138 07/30/2013 0354   K 5.8* 07/30/2013 0354   CL 100 07/30/2013 0354   CO2 24 07/30/2013 0354   GLUCOSE 160* 07/30/2013 0354   BUN 50* 07/30/2013 0354   CREATININE 1.19 07/30/2013 0354   CALCIUM 9.1 07/30/2013 0354   GFRNONAA 59* 07/30/2013 0354   GFRAA 68* 07/30/2013 0354    CMP     Component Value Date/Time   NA 138 07/30/2013 0354   K 5.8* 07/30/2013 0354   CL 100 07/30/2013 0354   CO2 24 07/30/2013 0354   GLUCOSE 160* 07/30/2013 0354   BUN 50* 07/30/2013 0354   CREATININE 1.19 07/30/2013 0354   CALCIUM 9.1 07/30/2013 0354   PROT 6.3 07/30/2013 0354   ALBUMIN 3.0* 07/30/2013 0354   AST 354* 07/30/2013 0354   ALT 511* 07/30/2013 0354   ALKPHOS 206* 07/30/2013 0354   BILITOT 1.4* 07/30/2013 0354   GFRNONAA 59* 07/30/2013 0354   GFRAA 68* 07/30/2013 0354     Time In Time Out Total Time Spent with Patient Total Overall Time  0945 1045 67min 78min    Greater than 50%  of this time was spent counseling and coordinating care related to the above assessment and plan.  Vinie Sill, NP Palliative Medicine Team Pager # [phone] (M-F 8a-5p) Team Phone # [phone] (Nights/Weekends)

## 2013-07-30 NOTE — Progress Notes (Addendum)
Full note to follow:  I had the opportunity to speak with [name], Mr. Harding's niece. We discussed his overall poor health status and prognosis with his extensive heart disease (EF 15%, atrial fibrillation, CAD). We discussed that cardiology does not feel he is a good candidate for pacemaker and that he focus on his comfort - I agree with this recommendation. [name] understands that his atrial fibrillation is placing more stress on his already weakened cardiac state. We also discussed his COPD and dementia and possible aspiration as further complications to his overall health. [name] is open to options for hospice and comfort care but she would also like to discuss with his children to give them the opportunity to be involved in decision making if they are willing to do so. She would also like to consult other family members as she has not seen him in ~1 month. She did tell me that she did not want him to go back to [facility] and feels he needs SNF level of care followed by palliative.   [name] says that she works [employer] but should be available today and tomorrow to be reached at [phone]. I made a follow-up call after speaking with Dr. Awanda Mink about concern for aspiration pneumonia and updated [name]. She wishes to continue current level of care with IVF, antibiotics, labs until she can speak with family about pursuing more comfort measures. She says she will call them today.   Vinie Sill, NP Palliative Medicine Team Pager # [phone] (M-F 8a-5p) Team Phone # [phone] (Nights/Weekends)

## 2013-07-30 NOTE — Progress Notes (Signed)
Family Medicine Teaching Service Daily Progress Note Intern Pager: [phone]  Patient name: David [name] Medical record number: [id number] Date of birth: [birth year] Age: 73 y.o. Gender: male  Primary Care Provider: PROVIDER NOT IN SYSTEM Consultants: Cardiology/HF, Palliative Care Code Status: DNR  Pt Overview and Major Events to Date:  7/6 Echo LV EF 15% (previously 40-45% in 2014), mod LA dilation, diffuse hypokinesis   Assessment and Plan:  David [name] is a 73 y.o. male presenting with acute dyspnea. PMH is significant for chronic systolic CHF (EF 66-06%; Echo 08/2012), Atrial flutter on coumadin, HTN, COPD w/ emphysema, CAD.   Chronic systolic CHF/ischemic CM- ProBNP 21K on admission. Chest xray clear. Given IV Lasix x1: neg 1.2L, weight down 5lbs. Echo (7/6) with EF 15% (down from 40-45% in 08/2012) , mod LA dilation and diffuse hypokinesis - Continue Metoprolol 25 BID.  BP too low to consider ACEi/ARB.  - Cardiology/HF c/s, appreciate recs.   Acute COPD exacerbation - Dyspnea improved.  Recently admitted for COPD exacerbation and needed Bipap, d/c 7/2. Patient was treated as a COPD exacerbation this visit in the ED, d/c Levaquin and Prednisone 7/6.  CXR unremarkable.  - Supplemental O2 as needed (breathing comfortably on RA since admission) - Scheduled Duonebs BID, albuterol Q2PRN - Continue home QVAR (flovent substituted)  Chronic atrial flutter. s/p ablation and cardioversion in 2008 and not a candidate for further treatment. Goal HR 110 (130s O/N) - Continue Metoprolol 63m BID.  Cannot titrate up 2/2 hypotension - Continue coumadin, Per pharmacy.  - Not a candidate for rate control due to contraindications to amio (ILD), dilt (LV dysfxn), digoxin (h/o bradycardia with use)  RLL Pneumonia: WBC 15.5 > 17.5 Today.  Afebrile.  CXR shows R base infiltrate.  Likely related to aspiration in this demented elderly man. - Start Zosyn and Vanc - Made NPO, started gentle IVF - Speech  eval  HTN: Currently hypotensive, SBP 90s-110 O/N.  At last hospitalization, baseline SBPs in 100s.  - Continue metoprolol as above - Will continue to monitor closely  Transaminitis: Worsening. No h/o Liver disease, consistent with shock liver.  AST 63>...>337 > 354, ALT 91>...>400>511, Alk Phos 206, TBili 1.4, Ammonia level normal - Continue to monitor  Hyperkalemia: K 5.8 (5.6 yesterday). Contiue to monitor  Hx of Hypothyroidism : Not on synthroid currently  - TSH from last admission was low at 0.237 however FT3 nl and FT4nl   Psych - Unclear diagnosis. Patient appears to have baseline dementia.  - Continue home medications: Cymbalta, Remeron, Invega   CAD, HLD : Continue statin (lipitor substituted for crestor), BB   History of alcohol abuse Patient placed on CIWA protocol on previous admission. Had CIWA scores ranging from 0-14. Will continue to monitor on CIWA protocol.  Hyperglycemia : Elevated on BMP, A1c 7.0  - Given acute illness, will hold treatment currently   FEN/GI: NPOt; D51/2NS @50mL /hr x12h Prophylaxis: Coumadin Per Pharm  Disposition: SNF vs hospice, pending GOC and placement. Palliative Care c/s for GOC discussion with family, appreciate recs.  Ethics consult : Can move forward with medical decision making if unable to get in touch with family.  [place], should speak to Palliative Care today.  Subjective:  Patient is demented.  Denies any complaints. No concerns from RN staff  Objective: Temp:  [97 F (36.1 C)-98.1 F (36.7 C)] 98.1 F (36.7 C) (07/10 0700) Pulse Rate:  [90-132] 130 (07/10 0337) Resp:  [23-31] 23 (07/10 0337) BP: (90-108)/(55-94) 105/70 mmHg (  07/10 0337) SpO2:  [90 %-97 %] 92 % (07/10 0337) Weight:  [142 lb 6.7 oz (64.6 kg)] 142 lb 6.7 oz (64.6 kg) (07/10 0500) Physical Exam: General: Pleasant, demented, elderly man in NAD. Thin and frail. Pupils dilated, but reactive. No nystagmus  Heart: tachycardic no murmurs.   Lungs: Normal WOB. Rhonchi bilaterally Abdomen: Soft, non-tender, non-distended, BS + Extremities: No clubbing, cyanosis or edema.  Laboratory:  Recent Labs Lab 07/28/13 0820 07/29/13 0303 07/30/13 0354  WBC 14.8* 15.5* 17.5*  HGB 13.9 13.8 13.2  HCT 41.2 41.3 39.8  PLT 156 162 148*    Recent Labs Lab 07/28/13 1900 07/29/13 0303 07/30/13 0354  NA 135* 137 138  K 5.3 5.6* 5.8*  CL 96 99 100  CO2 23 24 24   BUN 41* 43* 50*  CREATININE 1.18 1.18 1.19  CALCIUM 8.9 9.1 9.1  PROT 6.3 6.3 6.3  BILITOT 1.2 1.4* 1.4*  ALKPHOS 194* 181* 206*  ALT 369* 400* 511*  AST 409* 337* 354*  GLUCOSE 245* 156* 160*    AM Cortisol 38.8 (7/6)  Ammonia 37  Imaging/Diagnostic Tests: CXR (7/5): The heart size and mediastinal contours are within normal limits. Both lungs are clear. The visualized skeletal structures are unremarkable. IMPRESSION: No active disease.  Echo (7/6) Study Conclusions - Left ventricle: Prominant apical trabeculations and false band. The cavity size was moderately dilated. Wall thickness was normal. The estimated ejection fraction was 15%. Diffuse hypokinesis. The study is not technically sufficient to allow evaluation of LV diastolic function. - Atrial septum: No defect or patent foramen ovale was identified. - Pulmonary arteries: PA peak pressure: 34 mm Hg (S).  CXR (7/9): Evidence of right base infiltrate. Lungs otherwise clear. Stable  cardiac enlargement.   [name], MD 07/30/2013, 8:00 AM PGY-1, [place] Intern pager: [phone], text pages welcome

## 2013-07-30 NOTE — Clinical Social Work Note (Signed)
Patient has bed at [facility]. Niece [name] was contacted and CSW informed her that she needs to complete paperwork with facility today in case the patient is ready for DC over the weekend. CSW made it clear to niece that if paperwork is not completed to day and patient is medically ready for DC over the weekend, then the patient would DC back to [facility]. [name] verbalized understanding. CSW contacted admissions coordinator Valentino Nose who will contact [name] to set up a time to do paperwork. CSW will leave report for weekend CSW.  Liz Beach MSW, [place], [place], [phone]

## 2013-07-30 NOTE — Progress Notes (Signed)
Utilization review completed.  

## 2013-07-30 NOTE — Progress Notes (Addendum)
FMTS Attending Note  I personally saw and evaluated the patient. The plan of care was discussed with the resident team. I agree with the assessment and plan as documented by the resident.   1. Acute on chronic systolic CHF/ischemic cardiomyopathy - Echo on 7/6 identified severe systolic dysfunction with EF 15%, no diuretics currently due to hypotension 2. Aspiration Pneumonia - CXR on 07/29/13 suggestive of pneumonia, started on Vancomycin and Zosyn, awaiting speech evaluation, currently NPO and will advance diet as advised from speech 2. COPD - maintaining SpO2 low 90's on RA, continue home breathing treatments  3. Chronic Atrial Flutter - no improvement of tachycardia with addition of Metoprolol 25 mg, BP can not tolerate additional antihypertensive medications no further interventions planned from cardiac standpoint at this time  4. HTN - currently hypotensive likely due to severe systolic CHF, increase Metoprolol as BP allows  5. CAD - agree with continuation of home statin and BB  6. Encephalopathy - no change in mental status 7. Anticoagulation per pharmacy, INR currently therapeutic  Disposition - Palliative care was able to touch base with family today, awaiting decision on code status/SNF vs Hospice, due to current PNA not stable for discharge at this time  Dossie Arbour MD

## 2013-07-30 NOTE — Progress Notes (Addendum)
ANTICOAGULATION CONSULT NOTE - Follow up [facility] for Coumadin Indication: atrial flutter  Allergies  Allergen Reactions  . Ace Inhibitors   . Oat Flour [Oat]     Unknown per MAR  . Other     Greens: unknown reaction per [facility]    Patient Measurements: Height: 6\' 1"  (185.4 cm) Weight: 142 lb 6.7 oz (64.6 kg) IBW/kg (Calculated) : 79.9  Vital Signs: Temp: 98.1 F (36.7 C) (07/10 0700) Temp src: Axillary (07/10 0700) BP: 105/70 mmHg (07/10 0337) Pulse Rate: 130 (07/10 0337)  Labs:  Recent Labs  07/28/13 0820  07/28/13 1900 07/29/13 0303 07/30/13 0354  HGB 13.9  --   --  13.8 13.2  HCT 41.2  --   --  41.3 39.8  PLT 156  --   --  162 148*  LABPROT 30.4*  --   --  26.1* 22.8*  INR 2.91*  --   --  2.39* 2.01*  CREATININE  --   < > 1.18 1.18 1.19  < > = values in this interval not displayed.  Estimated Creatinine Clearance: 50.5 ml/min (by C-G formula based on Cr of 1.19).   Medical History: Past Medical History  Diagnosis Date  . Community acquired pneumonia     Right middle lobe community-acquired pneumonia  . Toxic metabolic encephalopathy   . Chronic atrial flutter     a. rate controlled with bb therapy;  b. h/o bradycardia when on digoxin 12/2011.  . Tobacco abuse   . History of coronary artery disease 2008    a. Cath at [facility]: RCA 95%, RI 60%  - medical therapy  . Ischemic cardiomyopathy     a. prev documented EF of 20-30%;  b. 09/2012 Echo: EF 40-45%, diff HK, mild MR, mild bi-atrial enlargement.  . History of prostate cancer   . Chronic obstructive pulmonary disease     with mild bronchospasm, improved  . Hypertension   . History of alcohol abuse     History of prior heavy alcohol abuse  . History of depression   . Altered mental status 2012   . Noncompliance   . Depression   . Ulcer   . Hx of migraines   . Hyperlipidemia   . Colon cancer     Assessment: 73yo male c/o SOB x3d that became acutely worse PTA, rec'd neb by  EMS which helped Sx, to continue Coumadin during admission for Aflutter; last Coumadin dose PTA 7/5. INR remains therapeutic but has trended down today to 2.01. Of note, LFTs continue to rise acutely since. H/H wnl. Plt 148.   Home Coumadin dose: 5 mg daily   Goal of Therapy:  INR 2-3   Plan:  1) Coumadin 5 mg x 1 dose  2) Monitor daily PT/INR  3) F/u palliative care recommendations    Albertina Parr, PharmD.  Clinical Pharmacist Pager [phone]    Addendum: Pharmacy consulted to start Vancomycin in addition to Zosyn for possible aspiration pneumonia. WBC elevated at 17.5. CXR shows evidence of right base infiltrate. CrCl ~ 50 mL/min.   Plan: 1) Start Vancomycin 500 mg IV Q 12 hours 2) Zosyn 3.375 gm IV Q 8 hours per MD 3) Monitor CBC, renal fx and patient's clinical progress 4) Vanc trough at steady state  Albertina Parr, PharmD.  Clinical Pharmacist Pager [phone]

## 2013-07-30 NOTE — Progress Notes (Signed)
I have again left a message for [name] to contact me for a time to discuss Mr. Anastacio goals of care. Will await call back to palliative phone [phone]. I have made multiple attempts to reach [facility] and have left 3 messages for her to call back with a time I may call to discuss Mr. Sass care without success.   Vinie Sill, NP Palliative Medicine Team Pager # [phone] (M-F 8a-5p) Team Phone # [phone] (Nights/Weekends)

## 2013-07-31 LAB — CBC
HCT: 40 % (ref 39.0–52.0)
Hemoglobin: 13 g/dL (ref 13.0–17.0)
MCH: 29.6 pg (ref 26.0–34.0)
MCHC: 32.5 g/dL (ref 30.0–36.0)
MCV: 91.1 fL (ref 78.0–100.0)
PLATELETS: 133 10*3/uL — AB (ref 150–400)
RBC: 4.39 MIL/uL (ref 4.22–5.81)
RDW: 16.3 % — ABNORMAL HIGH (ref 11.5–15.5)
WBC: 21.5 10*3/uL — ABNORMAL HIGH (ref 4.0–10.5)

## 2013-07-31 LAB — COMPREHENSIVE METABOLIC PANEL
ALBUMIN: 2.9 g/dL — AB (ref 3.5–5.2)
ALK PHOS: 224 U/L — AB (ref 39–117)
ALT: 781 U/L — AB (ref 0–53)
ANION GAP: 17 — AB (ref 5–15)
AST: 623 U/L — ABNORMAL HIGH (ref 0–37)
BILIRUBIN TOTAL: 1.8 mg/dL — AB (ref 0.3–1.2)
BUN: 61 mg/dL — AB (ref 6–23)
CHLORIDE: 101 meq/L (ref 96–112)
CO2: 24 mEq/L (ref 19–32)
Calcium: 9.1 mg/dL (ref 8.4–10.5)
Creatinine, Ser: 1.5 mg/dL — ABNORMAL HIGH (ref 0.50–1.35)
GFR calc Af Amer: 52 mL/min — ABNORMAL LOW (ref >90)
GFR calc non Af Amer: 44 mL/min — ABNORMAL LOW (ref >90)
Glucose, Bld: 219 mg/dL — ABNORMAL HIGH (ref 70–99)
POTASSIUM: 5.2 meq/L (ref 3.7–5.3)
SODIUM: 142 meq/L (ref 137–147)
TOTAL PROTEIN: 6.3 g/dL (ref 6.0–8.3)

## 2013-07-31 LAB — PROTIME-INR
INR: 2.17 — ABNORMAL HIGH (ref 0.00–1.49)
Prothrombin Time: 24.2 seconds — ABNORMAL HIGH (ref 11.6–15.2)

## 2013-07-31 MED ORDER — WARFARIN SODIUM 2.5 MG PO TABS
2.5000 mg | ORAL_TABLET | Freq: Once | ORAL | Status: AC
  Administered 2013-07-31: 2.5 mg via ORAL
  Filled 2013-07-31: qty 1

## 2013-07-31 MED ORDER — BIOTENE DRY MOUTH MT LIQD
15.0000 mL | Freq: Two times a day (BID) | OROMUCOSAL | Status: DC
  Administered 2013-07-31 – 2013-08-02 (×5): 15 mL via OROMUCOSAL

## 2013-07-31 NOTE — Progress Notes (Signed)
FMTS Attending Note  I personally saw and evaluated the patient. The plan of care was discussed with the resident team. I agree with the assessment and plan as documented by the resident.   Clinical picture unchanged from yesterday, has not required oxygen overnight per RN staff, tolerating Dysphagia I diet  1. Acute on chronic systolic CHF/ischemic cardiomyopathy - Echo on 7/6 identified severe systolic dysfunction with EF 15%, no diuretics currently due to hypotension, cardiology to sign off today  2. Aspiration Pneumonia - CXR on 07/29/13 suggestive of pneumonia, started on Vancomycin and Zosyn, speech therapy recommended dysphagia I diet, no oxygen requirement, likely transition to PO antibiotics tomorrow 2. COPD - maintaining SpO2 low 90's on RA, continue home breathing treatments  3. Chronic Atrial Flutter - stable 4. HTN - patient continues to be hypotensive, increase Metoprolol as BP allows  5. CAD - agree with continuation of home statin and BB  6. Encephalopathy - no change in mental status  7. Anticoagulation per pharmacy, INR currently therapeutic  Disposition: awaiting family decision on Hospice vs SNF  Dossie Arbour MD

## 2013-07-31 NOTE — Consult Note (Signed)
I have reviewed and discussed case with Nurse Practitioner And agree with documentation and plan as noted above   Mylo Driskill J. Seymour Pavlak D.O.  Palliative Medicine Team at [place]  Team Phone: [phone]    

## 2013-07-31 NOTE — Progress Notes (Signed)
Patient ID: Chancelor Hardrick, male   DOB: 1940-11-04, 72 y.o.   MRN: [id number]     Patient Name: Hughes Wyndham Date of Encounter: 07/31/2013  Active Problems:   Acute on chronic systolic heart failure   Dyspnea   Palliative care encounter    Patient Profile: Mr. Ocallaghan is a 73 yo man with PMH of CAD with LHC '08 with 95% RCA and 60% Ramus treated medically, ischemic cardiomyopathy with EF 40-45% '14, COPD and chronic ILD with chronic respiratory failure, atrial flutter (chronically) on warfarin, hypothyroidism, prior heavy tobacco/alcohol use who lives in a nursing home, d/c 06/30 for SOB, admitted 07/06 with acute dyspnea. ECHO on this admission with EF 15%.    SUBJECTIVE: Not very responsive  Has pads on hands   OBJECTIVE Filed Vitals:   07/31/13 0001 07/31/13 0401 07/31/13 0700 07/31/13 0937  BP: 93/73 90/70    Pulse: 131 130    Temp: 98 F (36.7 C) 96.9 F (36.1 C) 97.6 F (36.4 C)   TempSrc: Axillary Axillary Oral   Resp: 32 32    Height:      Weight:      SpO2: 97% 100%  100%    Intake/Output Summary (Last 24 hours) at 07/31/13 1113 Last data filed at 07/31/13 0730  Gross per 24 hour  Intake    575 ml  Output    975 ml  Net   -400 ml   Filed Weights   07/26/13 0355 07/29/13 0300 07/30/13 0500  Weight: 143 lb 8.3 oz (65.1 kg) 146 lb 6.2 oz (66.4 kg) 142 lb 6.7 oz (64.6 kg)    PHYSICAL EXAM Affect appropriate Chronically ill black male  HEENT: normal Neck supple with no adenopathy JVP normal no bruits no thyromegaly Lungs diffuse upper airway rhonchi  wheezing and good diaphragmatic motion Heart:  S1/S2 no murmur, no rub, gallop or click PMI normal Abdomen: benighn, BS positve, no tenderness, no AAA no bruit.  No HSM or HJR Distal pulses intact with no bruits No edema Neuro non-focal Skin warm and dry No muscular weakness  LABS: CBC:  Recent Labs  07/30/13 0354 07/31/13 0230  WBC 17.5* 21.5*  HGB 13.2 13.0  HCT 39.8 40.0  MCV 89.8 91.1  PLT  148* 133*   INR:  Recent Labs  07/31/13 0230  INR 2.29*   Basic Metabolic Panel:  Recent Labs  07/30/13 0354 07/31/13 0230  NA 138 142  K 5.8* 5.2  CL 100 101  CO2 24 24  GLUCOSE 160* 219*  BUN 50* 61*  CREATININE 1.19 1.50*  CALCIUM 9.1 9.1   Liver Function Tests:  Recent Labs  07/30/13 0354 07/31/13 0230  AST 354* 623*  ALT 511* 781*  ALKPHOS 206* 224*  BILITOT 1.4* 1.8*  PROT 6.3 6.3  ALBUMIN 3.0* 2.9*   BNP: Pro B Natriuretic peptide (BNP)  Date/Time Value Ref Range Status  07/25/2013 10:02 PM 21407.0* 0 - 125 pg/mL Final  07/17/2013  3:49 PM 5636.0* 0 - 125 pg/mL Final    TELE:  Atrial flutter, RVR      Current Medications:  . antiseptic oral rinse  15 mL Mouth Rinse BID  . DULoxetine  60 mg Oral Daily  . fluticasone  1 puff Inhalation BID  . folic acid  1 mg Oral Daily  . guaifenesin  300 mg Oral Q6H  . ipratropium-albuterol  3 mL Nebulization BID  . lactulose  20 g Oral Daily  . metoprolol tartrate  25 mg  Oral BID  . mirtazapine  15 mg Oral QHS  . multivitamin with minerals  1 tablet Oral Daily  . paliperidone  6 mg Oral Daily  . piperacillin-tazobactam (ZOSYN)  IV  3.375 g Intravenous Q8H  . sodium chloride  3 mL Intravenous Q12H  . thiamine  100 mg Oral Daily   Or  . thiamine  100 mg Intravenous Daily  . tiotropium  18 mcg Inhalation Daily  . vancomycin  500 mg Intravenous Q12H  . Warfarin - Pharmacist Dosing Inpatient   Does not apply q1800      ASSESSMENT AND PLAN: Almond Fitzgibbon is a 73 y.o. male with a history of CAD s/p LHC RCA 95%, RI 60% - rx therapy, COPD, ischemic cardiomyopathy (EF 40-45% ECHO 2014), HTN, hypothyroidism, chronic atrial flutter on coumadin, and heavy alcohol use/tobacco abuse who was brought in from the nursing home to [facility] on 07/26/13 for acute dyspnea.He is being treated for an acute COPD exacerbation in the setting of chronic ILD and chronic respiratory failure as well as acute CHF. He is chronically in atrial  flutter, but had had some RVR and cardiology has been consulted for help in rate control.   Chronic atrial flutter. Recurrent after DCCV -- 09/2012 hospitalization he had RVR. No dig due to bradycardia -- Controlled as an outpatient on metoprolol 25mg  mg BID only. Cannot up-titrate due to hypotension  -- Continue coumadin per pharmacy.  --  Poor rate control  130 this am but limited Rx based on comorbidities and low BP   Chronic systolic CHF/ischemic CM- ProBNP of 21K plus on admit. Chest xray clear. Given IV Lasix: neg 1.6L, weight down 2lbs from admit.  -- Echo on this admission with EF 15%, mod LA dilation and diffuse hypokinesis. This is significantly decreased from 09/20/12 EF 40 to 45%, mild MR.  -- NO ACE inhibitor or ARB due to low BP -- Continue BB.  -- Off IV Lasix limited but BUN still increasing and PAS only 34 on echo, feel he has chronically low output. BP too low for afterload reducers and aflutter doesn't help. Not on lasix daily now with azotemia  Not a candidate for milrinone   Acute COPD exacerbation  -- Continue tx per IM   History of alcohol abuse. Possibe withdrawing.  -- per IM -- Monitor for DTs.   CAD- Cath at [facility]: RCA 95%, RI 60% - medical therapy  -- No objective signs of ischemia. No chest pain.  -- Continue statin, BB. Not on ASA due to coumadin use.   HTN  --  SBPs in 80s at one point, currently 90s-110, per IM -- Will continue to monitor closely   Agree with palliative care consult  Will sign off    Jenkins Rouge

## 2013-07-31 NOTE — Progress Notes (Signed)
ANTICOAGULATION CONSULT NOTE - Follow up [place] for Coumadin Indication: atrial flutter  Allergies  Allergen Reactions  . Ace Inhibitors   . Oat Flour [Oat]     Unknown per MAR  . Other     Greens: unknown reaction per [facility]    Patient Measurements: Height: 6\' 1"  (185.4 cm) Weight: 142 lb 6.7 oz (64.6 kg) IBW/kg (Calculated) : 79.9  Vital Signs: Temp: 97.5 F (36.4 C) (07/11 1234) Temp src: Oral (07/11 1234) BP: 90/70 mmHg (07/11 0401) Pulse Rate: 130 (07/11 0401)  Labs:  Recent Labs  07/29/13 0303 07/30/13 0354 07/31/13 0230  HGB 13.8 13.2 13.0  HCT 41.3 39.8 40.0  PLT 162 148* 133*  LABPROT 26.1* 22.8* 24.2*  INR 2.39* 2.01* 2.17*  CREATININE 1.18 1.19 1.50*    Estimated Creatinine Clearance: 40.1 ml/min (by C-G formula based on Cr of 1.5).   Medical History: Past Medical History  Diagnosis Date  . Community acquired pneumonia     Right middle lobe community-acquired pneumonia  . Toxic metabolic encephalopathy   . Chronic atrial flutter     a. rate controlled with bb therapy;  b. h/o bradycardia when on digoxin 12/2011.  . Tobacco abuse   . History of coronary artery disease 2008    a. Cath at [facility]: RCA 95%, RI 60%  - medical therapy  . Ischemic cardiomyopathy     a. prev documented EF of 20-30%;  b. 09/2012 Echo: EF 40-45%, diff HK, mild MR, mild bi-atrial enlargement.  . History of prostate cancer   . Chronic obstructive pulmonary disease     with mild bronchospasm, improved  . Hypertension   . History of alcohol abuse     History of prior heavy alcohol abuse  . History of depression   . Altered mental status 2012   . Noncompliance   . Depression   . Ulcer   . Hx of migraines   . Hyperlipidemia   . Colon cancer     Assessment: 73yo male c/o SOB x3d that became acutely worse PTA, rec'd neb by EMS which helped Sx, to continue Coumadin during admission for Aflutter; last Coumadin dose PTA 7/5. INR remains  therapeutic at 2.14. Of note, LFTs continue to rise acutely since. CBC stable, no s/sx of bleeding.  Home Coumadin dose: 5 mg daily   Goal of Therapy:  INR 2-3   Plan:  1) Coumadin 2.5 mg x 1 dose  2) Monitor daily PT/INR  3) F/u palliative care recommendations    Emmary Culbreath E. Consuela Widener, Pharm.D Clinical Pharmacy Resident Pager: [phone] 07/31/2013 12:51 PM

## 2013-07-31 NOTE — Progress Notes (Signed)
Family Medicine Teaching Service Daily Progress Note Intern Pager: [phone]  Patient name: David [name] Medical record number: [id number] Date of birth: [birth year] Age: 73 y.o. Gender: male  Primary Care Provider: PROVIDER NOT IN SYSTEM Consultants: Cardiology/HF, Palliative Care Code Status: DNR  Pt Overview and Major Events to Date:  7/6 Echo LV EF 15% (previously 40-45% in 2014), mod LA dilation, diffuse hypokinesis   Assessment and Plan:  David [name] is a 73 y.o. male presenting with acute dyspnea. PMH is significant for chronic systolic CHF (EF 05-69%; Echo 08/2012), Atrial flutter on coumadin, HTN, COPD w/ emphysema, CAD.   Chronic systolic CHF/ischemic CM - ProBNP 21K on admission. Chest xray clear. Given IV Lasix x1: neg 1.2L, weight down 5lbs. Echo (7/6) with EF 15% (down from 40-45% in 08/2012) , mod LA dilation and diffuse hypokinesis - Continue Metoprolol 25 BID.  BP too low to consider ACEi/ARB.  - Cardiology/HF c/s, appreciate recs.   Acute COPD exacerbation - Dyspnea improved.  Recently admitted for COPD exacerbation and needed Bipap, d/c 7/2. Patient was treated as a COPD exacerbation this visit in the ED, d/c Levaquin and Prednisone 7/6.  CXR unremarkable.  - Supplemental O2 as needed (breathing comfortably on RA since admission) - Scheduled Duonebs BID, albuterol Q2PRN - Continue home QVAR (flovent substituted)  Chronic atrial flutter. s/p ablation and cardioversion in 2008 and not a candidate for further treatment. Goal HR 110 (130s O/N) - Continue Metoprolol 36m BID.  Cannot titrate up 2/2 hypotension - Continue coumadin, Per pharmacy.  - Not a candidate for rate control due to contraindications to amio (ILD), dilt (LV dysfxn), digoxin (h/o bradycardia with use)  RLL Pneumonia: WBC 17.5>21.5 Today.  Afebrile.  CXR (7/10) shows R base infiltrate.  Likely related to aspiration in this demented elderly man. - Continue Zosyn and Vanc -Speech swallow study completed,  dys-1 diet, still some aspiration risk due to mentation.  HTN: Currently hypotensive, SBP 90s-110 O/N.  At last hospitalization, baseline SBPs in 100s.  - Continue metoprolol as above - Will continue to monitor closely  Transaminitis: Worsening. No h/o Liver disease, consistent with shock liver.  AST 63>...> 354 >623, ALT 91>...>511>781, Alk Phos 224, TBili 1.8, Ammonia level normal - Continue to monitor  Hyperkalemia: Resolved K 5.2. Contiue to monitor  Hx of Hypothyroidism : Not on synthroid currently  - TSH from last admission was low at 0.237 however FT3 nl and FT4nl   Psych - Unclear diagnosis. Patient appears to have baseline dementia.  - Continue home medications: Cymbalta, Remeron, Invega   CAD, HLD : Continue statin (lipitor substituted for crestor), BB   History of alcohol abuse Patient placed on CIWA protocol on previous admission. Had CIWA scores ranging from 0-14. Will continue to monitor on CIWA protocol.  Hyperglycemia : Elevated on BMP, A1c 7.0  - Given acute illness, will hold treatment currently   FEN/GI: dysphagia 1, thin liquids Prophylaxis: Coumadin Per Pharm  Disposition: Pending clinical improvement from PNA.  SNF vs hospice, pending GOC and placement. Palliative Care c/s, appreciate recs.  Yesterday POA expressed desire for SNF with possible transition to palliative care later.  Subjective:  Patient is demented.  Denies any complaints. No concerns from RN staff.  Objective: Temp:  [96.9 F (36.1 C)-98.1 F (36.7 C)] 96.9 F (36.1 C) (07/11 0401) Pulse Rate:  [130-131] 130 (07/11 0401) Resp:  [19-34] 32 (07/11 0401) BP: (90-108)/(52-73) 90/70 mmHg (07/11 0401) SpO2:  [92 %-100 %] 100 % (07/11 0401) Physical Exam:  General: Pleasant, demented, elderly man in NAD. Thin and frail. Laying sideways in bed. HEENT: Pupils dilated, but reactive. No nystagmus  Heart: tachycardic no murmurs.  Lungs: Normal WOB. Rhonchi bilaterally Abdomen: Soft, non-tender,  non-distended, BS + Extremities: No clubbing, cyanosis or edema.  Laboratory:  Recent Labs Lab 07/29/13 0303 07/30/13 0354 07/31/13 0230  WBC 15.5* 17.5* 21.5*  HGB 13.8 13.2 13.0  HCT 41.3 39.8 40.0  PLT 162 148* 133*    Recent Labs Lab 07/29/13 0303 07/30/13 0354 07/31/13 0230  NA 137 138 142  K 5.6* 5.8* 5.2  CL 99 100 101  CO2 24 24 24   BUN 43* 50* 61*  CREATININE 1.18 1.19 1.50*  CALCIUM 9.1 9.1 9.1  PROT 6.3 6.3 6.3  BILITOT 1.4* 1.4* 1.8*  ALKPHOS 181* 206* 224*  ALT 400* 511* 781*  AST 337* 354* 623*  GLUCOSE 156* 160* 219*    AM Cortisol 38.8 (7/6)  Ammonia 37  Imaging/Diagnostic Tests: CXR (7/5): The heart size and mediastinal contours are within normal limits. Both lungs are clear. The visualized skeletal structures are unremarkable. IMPRESSION: No active disease.  Echo (7/6) Study Conclusions - Left ventricle: Prominant apical trabeculations and false band. The cavity size was moderately dilated. Wall thickness was normal. The estimated ejection fraction was 15%. Diffuse hypokinesis. The study is not technically sufficient to allow evaluation of LV diastolic function. - Atrial septum: No defect or patent foramen ovale was identified. - Pulmonary arteries: PA peak pressure: 34 mm Hg (S).  CXR (7/9): Evidence of right base infiltrate. Lungs otherwise clear. Stable  cardiac enlargement.   [name], MD 07/31/2013, 6:58 AM PGY-1, [place] Intern pager: [phone], text pages welcome

## 2013-08-01 LAB — COMPREHENSIVE METABOLIC PANEL
ALT: 1026 U/L — ABNORMAL HIGH (ref 0–53)
AST: 878 U/L — AB (ref 0–37)
Albumin: 2.8 g/dL — ABNORMAL LOW (ref 3.5–5.2)
Alkaline Phosphatase: 214 U/L — ABNORMAL HIGH (ref 39–117)
Anion gap: 17 — ABNORMAL HIGH (ref 5–15)
BUN: 69 mg/dL — ABNORMAL HIGH (ref 6–23)
CO2: 24 meq/L (ref 19–32)
CREATININE: 1.57 mg/dL — AB (ref 0.50–1.35)
Calcium: 9 mg/dL (ref 8.4–10.5)
Chloride: 102 mEq/L (ref 96–112)
GFR calc Af Amer: 49 mL/min — ABNORMAL LOW (ref >90)
GFR, EST NON AFRICAN AMERICAN: 42 mL/min — AB (ref >90)
Glucose, Bld: 144 mg/dL — ABNORMAL HIGH (ref 70–99)
Potassium: 5.2 mEq/L (ref 3.7–5.3)
Sodium: 143 mEq/L (ref 137–147)
Total Bilirubin: 2.2 mg/dL — ABNORMAL HIGH (ref 0.3–1.2)
Total Protein: 6.1 g/dL (ref 6.0–8.3)

## 2013-08-01 LAB — CBC
HEMATOCRIT: 38.4 % — AB (ref 39.0–52.0)
Hemoglobin: 12.8 g/dL — ABNORMAL LOW (ref 13.0–17.0)
MCH: 30 pg (ref 26.0–34.0)
MCHC: 33.3 g/dL (ref 30.0–36.0)
MCV: 89.9 fL (ref 78.0–100.0)
PLATELETS: 112 10*3/uL — AB (ref 150–400)
RBC: 4.27 MIL/uL (ref 4.22–5.81)
RDW: 16.4 % — ABNORMAL HIGH (ref 11.5–15.5)
WBC: 21.5 10*3/uL — AB (ref 4.0–10.5)

## 2013-08-01 LAB — PROTIME-INR
INR: 2.64 — ABNORMAL HIGH (ref 0.00–1.49)
Prothrombin Time: 28.2 seconds — ABNORMAL HIGH (ref 11.6–15.2)

## 2013-08-01 MED ORDER — WARFARIN SODIUM 1 MG PO TABS
1.0000 mg | ORAL_TABLET | Freq: Once | ORAL | Status: AC
  Administered 2013-08-01: 1 mg via ORAL
  Filled 2013-08-01: qty 1

## 2013-08-01 MED ORDER — AMOXICILLIN-POT CLAVULANATE ER 1000-62.5 MG PO TB12
2.0000 | ORAL_TABLET | Freq: Two times a day (BID) | ORAL | Status: DC
  Filled 2013-08-01 (×2): qty 2

## 2013-08-01 MED ORDER — SODIUM CHLORIDE 0.45 % IV SOLN
INTRAVENOUS | Status: DC
  Administered 2013-08-01: 09:00:00 via INTRAVENOUS

## 2013-08-01 MED ORDER — AMOXICILLIN-POT CLAVULANATE 875-125 MG PO TABS
1.0000 | ORAL_TABLET | Freq: Two times a day (BID) | ORAL | Status: DC
  Administered 2013-08-01 – 2013-08-02 (×3): 1 via ORAL
  Filled 2013-08-01 (×4): qty 1

## 2013-08-01 NOTE — Progress Notes (Signed)
FMTS Attending Note  I personally saw and evaluated the patient. The plan of care was discussed with the resident team. I agree with the assessment and plan as documented by the resident.   No acute issues per nursing staff, asked by RT to discontinue Spiriva and Flovent as patient not able to take deep breath to inhale medications, poor PO intake  1. Acute on chronic systolic CHF/ischemic cardiomyopathy - Echo on 7/6 identified severe systolic dysfunction with EF 15%, stable 2. Aspiration Pneumonia - CXR on 07/29/13 suggestive of pneumonia, no oxygen requirement, no fevers, WBC still elevated however has received steroids during hospital stay, transition to Augmentin today 2. COPD - maintaining SpO2 low 90's on RA, discontinue Spiriva and Flovent 3. Chronic Atrial Flutter - stable  4. HTN - patient continues to be hypotensive, increase Metoprolol as BP allows  5. CAD - agree with continuation of home statin and BB  6. Encephalopathy - no change in mental status  7. Anticoagulation per pharmacy, INR currently therapeutic   Disposition: awaiting family decision on Hospice vs SNF

## 2013-08-01 NOTE — Progress Notes (Signed)
ANTICOAGULATION CONSULT NOTE - Follow up [place] for Coumadin Indication: atrial flutter  Allergies  Allergen Reactions  . Ace Inhibitors   . Oat Flour [Oat]     Unknown per MAR  . Other     Greens: unknown reaction per [facility]    Patient Measurements: Height: 6\' 1"  (185.4 cm) Weight: 140 lb 10.5 oz (63.8 kg) IBW/kg (Calculated) : 79.9  Vital Signs: Temp: 97.6 F (36.4 C) (07/12 0813) Temp src: Axillary (07/12 0813) BP: 103/65 mmHg (07/12 0404) Pulse Rate: 128 (07/12 0404)  Labs:  Recent Labs  07/30/13 0354 07/31/13 0230 08/01/13 0315  HGB 13.2 13.0 12.8*  HCT 39.8 40.0 38.4*  PLT 148* 133* 112*  LABPROT 22.8* 24.2* 28.2*  INR 2.01* 2.17* 2.64*  CREATININE 1.19 1.50* 1.57*    Estimated Creatinine Clearance: 37.8 ml/min (by C-G formula based on Cr of 1.57).   Medical History: Past Medical History  Diagnosis Date  . Community acquired pneumonia     Right middle lobe community-acquired pneumonia  . Toxic metabolic encephalopathy   . Chronic atrial flutter     a. rate controlled with bb therapy;  b. h/o bradycardia when on digoxin 12/2011.  . Tobacco abuse   . History of coronary artery disease 2008    a. Cath at [facility]: RCA 95%, RI 60%  - medical therapy  . Ischemic cardiomyopathy     a. prev documented EF of 20-30%;  b. 09/2012 Echo: EF 40-45%, diff HK, mild MR, mild bi-atrial enlargement.  . History of prostate cancer   . Chronic obstructive pulmonary disease     with mild bronchospasm, improved  . Hypertension   . History of alcohol abuse     History of prior heavy alcohol abuse  . History of depression   . Altered mental status 2012   . Noncompliance   . Depression   . Ulcer   . Hx of migraines   . Hyperlipidemia   . Colon cancer     Assessment: 73yo male c/o SOB x3d that became acutely worse PTA, rec'd neb by EMS which helped Sx, to continue Coumadin during admission for Aflutter. INR remains therapeutic at 2.64.  Of  note, LFTs continue to rise acutely since admit 80/90>>800/1000. CBC stable, no s/sx of bleeding.  Home Coumadin dose: 5 mg daily   Goal of Therapy:  INR 2-3   Plan:  1) Coumadin 1 mg x 1 dose  2) Monitor daily PT/INR      Bonnita Nasuti Pharm.D. CPP, BCPS Clinical Pharmacist [phone] 08/01/2013 1:19 PM

## 2013-08-01 NOTE — Progress Notes (Signed)
Family Medicine Teaching Service Daily Progress Note Intern Pager: [phone]  Patient name: David [name] Medical record number: [id number] Date of birth: 1940-03-13 Age: 73 y.o. Gender: male  Primary Care Provider: PROVIDER NOT IN SYSTEM Consultants: Cardiology/HF, Palliative Care Code Status: DNR  Pt Overview and Major Events to Date:  7/6 Echo LV EF 15% (previously 40-45% in 2014), mod LA dilation, diffuse hypokinesis 7/9 Aspiration/HCAP developed 7/9 Palliative in contact with niece, plan for SNF with palliative  7/11 Switch to PO ABx  Assessment and Plan:  David [name] is a 73 y.o. male presenting with acute dyspnea. PMH is significant for chronic systolic CHF (EF 03-47%; Echo 08/2012), Atrial flutter on coumadin, HTN, COPD w/ emphysema, CAD.   Chronic systolic CHF/ischemic CM - ProBNP 21K on admission. Chest xray clear. Given IV Lasix x1: neg 1.2L, weight down 5lbs. Echo (7/6) with EF 15% (down from 40-45% in 08/2012) , mod LA dilation and diffuse hypokinesis - Continue Metoprolol 25 BID.  BP too low to consider ACEi/ARB.  - Cardiology/HF c/s, appreciate recs.  Not good candidate for aggressive intervention including CRT, pressors, aggressive diuresis   Chronic atrial flutter. s/p ablation and cardioversion in 2008 and not a candidate for further treatment due to co-morbidites. Goal HR 110 (130s O/N) - Continue Metoprolol 25mg  BID.  Cannot titrate up 2/2 hypotension - Continue coumadin, Per pharmacy.   Aspiration/HCAP : WBC elevated.  Afebrile.  CXR (7/10) shows R base infiltrate.  Likely related to aspiration in this demented elderly man. - Has been afebrile and on RA, switch to Augmentin 875 BID, discussed with pharmacy  -Speech swallow study completed, dys-1 diet, still some aspiration risk due to mentation.  Acute COPD exacerbation - Completed tx for COPD - Continue duonebs, albuterol, home inhaled steroid combo   HTN:  - Continue metoprolol as above  Transaminitis 2/2  shock liver - Continue to monitor, no intervention at this time   Hyperkalemia: Resolved K 5.2. Contiue to monitor  Hx of Hypothyroidism : Not on synthroid currently  - TSH from last admission was low at 0.237 however FT3 nl and FT4nl   Psych - Unclear diagnosis. Patient appears to have baseline dementia.  - Continue home medications: Cymbalta, Remeron, Invega   CAD, HLD : Continue statin (lipitor substituted for crestor), BB   FEN/GI: dysphagia 1, thin liquids Prophylaxis: Coumadin Per Pharm  Disposition: Pending clinical improvement from PNA.  SNF with palliative current plan, appreciate palliative recommendations and management of setting this up.   Subjective:  Denies SOB or palpitations.   Objective: Temp:  [97.4 F (36.3 C)-97.9 F (36.6 C)] 97.6 F (36.4 C) (07/12 0813) Pulse Rate:  [113-133] 128 (07/12 0404) Resp:  [24-39] 25 (07/12 0404) BP: (85-107)/(51-70) 103/65 mmHg (07/12 0404) SpO2:  [92 %-98 %] 97 % (07/12 0813) Weight:  [140 lb 10.5 oz (63.8 kg)] 140 lb 10.5 oz (63.8 kg) (07/12 0404) Physical Exam:  General: Pleasant, demented, elderly man in NAD. Thin and frail. Laying sideways in bed. HEENT: Pupils dilated, but reactive. No nystagmus  Heart: tachycardic no murmurs.  Lungs: Normal WOB. Rhonchi bilaterally with crackles RLL  Abdomen: Soft, non-tender, non-distended, BS + Extremities: No clubbing, cyanosis or edema.  Laboratory:  Recent Labs Lab 07/30/13 0354 07/31/13 0230 08/01/13 0315  WBC 17.5* 21.5* 21.5*  HGB 13.2 13.0 12.8*  HCT 39.8 40.0 38.4*  PLT 148* 133* 112*    Recent Labs Lab 07/30/13 0354 07/31/13 0230 08/01/13 0315  NA 138 142 143  K 5.8*  5.2 5.2  CL 100 101 102  CO2 24 24 24   BUN 50* 61* 69*  CREATININE 1.19 1.50* 1.57*  CALCIUM 9.1 9.1 9.0  PROT 6.3 6.3 6.1  BILITOT 1.4* 1.8* 2.2*  ALKPHOS 206* 224* 214*  ALT 511* 781* 1026*  AST 354* 623* 878*  GLUCOSE 160* 219* 144*    AM Cortisol 38.8 (7/6)  Ammonia  37  Imaging/Diagnostic Tests: CXR (7/5): The heart size and mediastinal contours are within normal limits. Both lungs are clear. The visualized skeletal structures are unremarkable. IMPRESSION: No active disease.  Echo (7/6) Study Conclusions - Left ventricle: Prominant apical trabeculations and false band. The cavity size was moderately dilated. Wall thickness was normal. The estimated ejection fraction was 15%. Diffuse hypokinesis. The study is not technically sufficient to allow evaluation of LV diastolic function. - Atrial septum: No defect or patent foramen ovale was identified. - Pulmonary arteries: PA peak pressure: 34 mm Hg (S).  CXR (7/9): Evidence of right base infiltrate. Lungs otherwise clear. Stable  cardiac enlargement.   David Rod, DO 08/01/2013, 9:52 AM PGY-3, [place] Intern pager: [phone], text pages welcome

## 2013-08-01 NOTE — Progress Notes (Signed)
Pt b/p 78/55 (61), pt lethargic and difficult to arouse.  Dr on called notified.  No orders given at this time.

## 2013-08-01 NOTE — Progress Notes (Signed)
Pt daughter, [name], called.  Gave daughter update on pt status.  Informed daughter that pt may not survive night.  Daughter states she will come up to hospital tomorrow.

## 2013-08-01 NOTE — Progress Notes (Signed)
Daughter, [name] contact info [phone]

## 2013-08-02 DIAGNOSIS — I4891 Unspecified atrial fibrillation: Secondary | ICD-10-CM | POA: Diagnosis present

## 2013-08-02 LAB — PROTIME-INR
INR: 3.29 — ABNORMAL HIGH (ref 0.00–1.49)
PROTHROMBIN TIME: 33.5 s — AB (ref 11.6–15.2)

## 2013-08-02 LAB — CBC
HCT: 38.3 % — ABNORMAL LOW (ref 39.0–52.0)
HEMOGLOBIN: 12.2 g/dL — AB (ref 13.0–17.0)
MCH: 28.9 pg (ref 26.0–34.0)
MCHC: 31.9 g/dL (ref 30.0–36.0)
MCV: 90.8 fL (ref 78.0–100.0)
Platelets: 104 10*3/uL — ABNORMAL LOW (ref 150–400)
RBC: 4.22 MIL/uL (ref 4.22–5.81)
RDW: 16.6 % — AB (ref 11.5–15.5)
WBC: 18.8 10*3/uL — ABNORMAL HIGH (ref 4.0–10.5)

## 2013-08-02 LAB — COMPREHENSIVE METABOLIC PANEL
ALBUMIN: 2.6 g/dL — AB (ref 3.5–5.2)
ALK PHOS: 212 U/L — AB (ref 39–117)
ALT: 1075 U/L — AB (ref 0–53)
AST: 787 U/L — ABNORMAL HIGH (ref 0–37)
Anion gap: 18 — ABNORMAL HIGH (ref 5–15)
BUN: 83 mg/dL — ABNORMAL HIGH (ref 6–23)
CALCIUM: 8.8 mg/dL (ref 8.4–10.5)
CO2: 23 mEq/L (ref 19–32)
Chloride: 102 mEq/L (ref 96–112)
Creatinine, Ser: 1.81 mg/dL — ABNORMAL HIGH (ref 0.50–1.35)
GFR calc non Af Amer: 35 mL/min — ABNORMAL LOW (ref >90)
GFR, EST AFRICAN AMERICAN: 41 mL/min — AB (ref >90)
GLUCOSE: 170 mg/dL — AB (ref 70–99)
POTASSIUM: 4.9 meq/L (ref 3.7–5.3)
SODIUM: 143 meq/L (ref 137–147)
TOTAL PROTEIN: 5.9 g/dL — AB (ref 6.0–8.3)
Total Bilirubin: 2.1 mg/dL — ABNORMAL HIGH (ref 0.3–1.2)

## 2013-08-02 MED ORDER — ASPIRIN 81 MG PO TBEC: 81.0000 mg | DELAYED_RELEASE_TABLET | Freq: Every day | ORAL | Status: AC

## 2013-08-02 MED ORDER — ASPIRIN EC 81 MG PO TBEC
81.0000 mg | DELAYED_RELEASE_TABLET | Freq: Every day | ORAL | Status: DC
  Administered 2013-08-02: 81 mg via ORAL
  Filled 2013-08-02: qty 1

## 2013-08-02 MED ORDER — AMOXICILLIN-POT CLAVULANATE 875-125 MG PO TABS: 1.0000 | ORAL_TABLET | Freq: Two times a day (BID) | ORAL | Status: AC

## 2013-08-02 MED ORDER — SORBITOL 70 % SOLN
30.0000 mL | Freq: Once | Status: AC
  Administered 2013-08-02: 30 mL via ORAL
  Filled 2013-08-02: qty 30

## 2013-08-02 MED ORDER — METOPROLOL TARTRATE 25 MG PO TABS: 25.0000 mg | ORAL_TABLET | Freq: Two times a day (BID) | ORAL | Status: AC

## 2013-08-02 NOTE — Progress Notes (Signed)
Note meeting with palliative NP planned for today. SLP to follow up next date for education re: dysphagia and mitigation strategies.  Luanna Salk, [facility] SLP [phone]

## 2013-08-02 NOTE — Progress Notes (Signed)
Progress Note from the Palliative Medicine Team at [place]: I met today with David [name] daughters, [name] [phone]) and Kalman Shan. They are very reasonable and willing to be part of their father's plan of care. We discussed his diagnoses CHF (EF15%), CAD, atrial fibrillation, hypotension, aspiration pneumonia and likely underlying dementia. They were unaware of most of these diagnoses and are open to hospice and comfort care and comfort feedings. They agree to a plan for SNF with hospice to help achieve comfort and manage symptoms (example given is respiratory distress related to CHF). They wish for him to be comfortable and not to suffer for whatever time he has left. We discussed prognosis of weeks to months and they understand that he is at risk to have complication of MI, stroke, etc that could cause death sooner. However they do wish for him to discharge to SNF in [place] area so they are able to be involved in his care and decisions for him. They tell me that they are willing to take this on to make decisions and be available for him. They were unaware of DNR status but agree with this decision. I gave them copy of Hard Choices booklet and my card to call with any questions.    Objective: Allergies  Allergen Reactions  . Ace Inhibitors   . Oat Flour [Oat]     Unknown per MAR  . Other     Greens: unknown reaction per [facility]   Scheduled Meds: . amoxicillin-clavulanate  1 tablet Oral Q12H  . antiseptic oral rinse  15 mL Mouth Rinse BID  . aspirin EC  81 mg Oral Daily  . folic acid  1 mg Oral Daily  . guaifenesin  300 mg Oral Q6H  . ipratropium-albuterol  3 mL Nebulization BID  . lactulose  20 g Oral Daily  . metoprolol tartrate  25 mg Oral BID  . mirtazapine  15 mg Oral QHS  . multivitamin with minerals  1 tablet Oral Daily  . paliperidone  6 mg Oral Daily  . sodium chloride  3 mL Intravenous Q12H  . thiamine  100 mg Oral Daily   Continuous  Infusions:  PRN Meds:.ondansetron (ZOFRAN) IV, ondansetron, polyethylene glycol  BP 73/59  Pulse 127  Temp(Src) 97.7 F (36.5 C) (Oral)  Resp 27  Ht $R'6\' 1"'wd$  (1.854 m)  Wt 62.4 kg (137 lb 9.1 oz)  BMI 18.15 kg/m2  SpO2 97%   PPS: 30% at best     Intake/Output Summary (Last 24 hours) at 08/02/13 1400 Last data filed at 08/02/13 1300  Gross per 24 hour  Intake    900 ml  Output    650 ml  Net    250 ml      LBM: PTA - unknown      Physical Exam:  General: NAD, pleasant, resting comfortably, thin, frail HEENT: [place]/AT, muscle wasting, no JVD, moist mucous membranes Chest: Rhonchi throughout, decreased bases, no labored breathing CVS: Tachycardic Abdomen: Soft, NT, ND, +BS Ext: MAE, no edema, warm to touch Neuro: Awake, alert, oriented to person, follows simple commands  Labs: CBC    Component Value Date/Time   WBC 18.8* 08/02/2013 0310   RBC 4.22 08/02/2013 0310   HGB 12.2* 08/02/2013 0310   HCT 38.3* 08/02/2013 0310   PLT 104* 08/02/2013 0310   MCV 90.8 08/02/2013 0310   MCH 28.9 08/02/2013 0310   MCHC 31.9 08/02/2013 0310   RDW 16.6* 08/02/2013 0310   LYMPHSABS 1.1 07/17/2013  1549   MONOABS 1.1* 07/17/2013 1549   EOSABS 0.0 07/17/2013 1549   BASOSABS 0.0 07/17/2013 1549    BMET    Component Value Date/Time   NA 143 08/02/2013 0310   K 4.9 08/02/2013 0310   CL 102 08/02/2013 0310   CO2 23 08/02/2013 0310   GLUCOSE 170* 08/02/2013 0310   BUN 83* 08/02/2013 0310   CREATININE 1.81* 08/02/2013 0310   CALCIUM 8.8 08/02/2013 0310   GFRNONAA 35* 08/02/2013 0310   GFRAA 41* 08/02/2013 0310    CMP     Component Value Date/Time   NA 143 08/02/2013 0310   K 4.9 08/02/2013 0310   CL 102 08/02/2013 0310   CO2 23 08/02/2013 0310   GLUCOSE 170* 08/02/2013 0310   BUN 83* 08/02/2013 0310   CREATININE 1.81* 08/02/2013 0310   CALCIUM 8.8 08/02/2013 0310   PROT 5.9* 08/02/2013 0310   ALBUMIN 2.6* 08/02/2013 0310   AST 787* 08/02/2013 0310   ALT 1075* 08/02/2013 0310   ALKPHOS 212* 08/02/2013  0310   BILITOT 2.1* 08/02/2013 0310   GFRNONAA 35* 08/02/2013 0310   GFRAA 41* 08/02/2013 0310     Assessment and Plan: 1. Code Status: DNR 2. Symptom Control: 1. Bowel Regimen: Lactulose 20 g daily. Sorbitol 70% 30 mL po once today. Miralax daily prn.  2. Nausea: ondansetron prn. 3. Aspiration: SLP following. Dys I thin liquids recommended.  4. Weakness: Continue medical management. Focus on comfort.  3. Psycho/Social: Emotional support provided to patient and daughters.  4. Disposition: SNF with hospice.   Patient Documents Completed or Given: Document Given Completed  Advanced Directives Pkt    MOST    DNR    Gone from My Sight    Hard Choices yes     Time In Time Out Total Time Spent with Patient Total Overall Time  1320 1405 56mn 489m    Greater than 50%  of this time was spent counseling and coordinating care related to the above assessment and plan.  AlVinie SillNP Palliative Medicine Team Pager # [phone]-F 8a-5p) Team Phone # [phone]/Weekends)

## 2013-08-02 NOTE — Progress Notes (Signed)
Family Medicine Teaching Service Daily Progress Note Intern Pager: [phone]  Patient name: David [name] Medical record number: [id number] Date of birth: 10/20/1940 Age: 73 y.o. Gender: male  Primary Care Provider: PROVIDER NOT IN SYSTEM Consultants: Cardiology/HF, Palliative Care Code Status: DNR  Pt Overview and Major Events to Date:  7/6 Echo LV EF 15% (previously 40-45% in 2014), mod LA dilation, diffuse hypokinesis 7/9 Aspiration/HCAP developed 7/9 Palliative in contact with niece, plan for SNF with palliative  7/11 Switch to PO ABx 7/13 family meeting to determine placement  Assessment and Plan:  David [name] is a 73 y.o. male presenting with acute dyspnea. PMH is significant for chronic systolic CHF (EF 46-27%; Echo 08/2012), Atrial flutter on coumadin, HTN, COPD w/ emphysema, CAD.   Chronic systolic CHF/ischemic CM - ProBNP 21K on admission. Chest xray clear. Given IV Lasix x1: neg 1.2L, weight down 5lbs. Echo (7/6) with EF 15% (down from 40-45% in 08/2012) , mod LA dilation and diffuse hypokinesis - Poor response to optimal treatment with diuretics, vasodilators AND severe NTHA Class IV symptoms. - Continue Metoprolol 25 BID.  BP too low to consider ACEi/ARB.  - Cardiology/HF c/s, appreciate recs.  Not good candidate for aggressive intervention including CRT, pressors, aggressive diuresis   Chronic atrial flutter. s/p ablation and cardioversion in 2008 and not a candidate for further treatment due to co-morbidites. Goal HR 110 (130s O/N) - Continue Metoprolol 25mg  BID.  Cannot titrate up 2/2 hypotension - D/c coumadin, start ASA (dementia and poor health status make coumadin a greater risk than benefit in this patient)  Aspiration/HCAP : WBC elevated.  Afebrile, on RA.  CXR (7/10) shows R base infiltrate.  Likely related to aspiration in this demented elderly man. - Continue Augmentin 875mg  BID. Last day 7/15 - Speech swallow study completed, dys-1 diet, still some aspiration  risk due to mentation.  Acute COPD exacerbation - Completed tx for COPD - Continue duonebs, home inhaled steroid combo. D/c albuterol as pt tachycardic.  HTN: Currently hypotensive.  Continue metoprolol as above  Transaminitis 2/2 shock liver : Continue to monitor, no intervention at this time   Hyperkalemia: Resolved   Hx of Hypothyroidism : Not on synthroid currently  - TSH from last admission was low at 0.237 however FT3 nl and FT4nl   Psych - Unclear diagnosis. Patient appears to have baseline dementia.  - Continue home medications: Cymbalta, Remeron, Invega   CAD, HLD : Continue statin (lipitor substituted for crestor), BB   FEN/GI: dysphagia 1, thin liquids Prophylaxis: ASA  Disposition: Pending clinical improvement from PNA.  SNF with palliative current plan, appreciate palliative recommendations and management of setting this up.   Subjective:  Denies any discomfort.  Not very communicative.  Objective: Temp:  [97.4 F (36.3 C)-97.6 F (36.4 C)] 97.6 F (36.4 C) (07/13 0730) Pulse Rate:  [127-128] 127 (07/13 0413) Resp:  [21-36] 25 (07/13 0830) BP: (63-101)/(40-65) 86/60 mmHg (07/13 0830) SpO2:  [95 %-98 %] 97 % (07/12 2100) Weight:  [137 lb 9.1 oz (62.4 kg)] 137 lb 9.1 oz (62.4 kg) (07/13 0413) Physical Exam: General: Pleasant, demented, elderly man in NAD. Thin and frail.  HEENT: Pupils dilated, but reactive. No nystagmus  Heart: tachycardic no murmurs.  Lungs: Normal WOB. Rhonchi bilaterally Abdomen: Soft, non-tender, non-distended, BS + Extremities: No clubbing, cyanosis or edema.  Laboratory:  Recent Labs Lab 07/31/13 0230 08/01/13 0315 08/02/13 0310  WBC 21.5* 21.5* 18.8*  HGB 13.0 12.8* 12.2*  HCT 40.0 38.4* 38.3*  PLT 133*  112* 104*    Recent Labs Lab 07/31/13 0230 08/01/13 0315 08/02/13 0310  NA 142 143 143  K 5.2 5.2 4.9  CL 101 102 102  CO2 24 24 23   BUN 61* 69* 83*  CREATININE 1.50* 1.57* 1.81*  CALCIUM 9.1 9.0 8.8  PROT 6.3  6.1 5.9*  BILITOT 1.8* 2.2* 2.1*  ALKPHOS 224* 214* 212*  ALT 781* 1026* 1075*  AST 623* 878* 787*  GLUCOSE 219* 144* 170*    AM Cortisol 38.8 (7/6)  Ammonia 37  Imaging/Diagnostic Tests: CXR (7/5): The heart size and mediastinal contours are within normal limits. Both lungs are clear. The visualized skeletal structures are unremarkable. IMPRESSION: No active disease.  Echo (7/6) Study Conclusions - Left ventricle: Prominant apical trabeculations and false band. The cavity size was moderately dilated. Wall thickness was normal. The estimated ejection fraction was 15%. Diffuse hypokinesis. The study is not technically sufficient to allow evaluation of LV diastolic function. - Atrial septum: No defect or patent foramen ovale was identified. - Pulmonary arteries: PA peak pressure: 34 mm Hg (S).  CXR (7/9): Evidence of right base infiltrate. Lungs otherwise clear. Stable  cardiac enlargement.   David Paganini, MD 08/02/2013, 8:42 AM PGY-3, [place] Intern pager: [phone], text pages welcome

## 2013-08-02 NOTE — Progress Notes (Signed)
Family Medicine Teaching Service Attending Note  I interviewed and examined patient David [name] and reviewed their tests and x-rays.  I discussed with Dr. Brita Romp and reviewed their note for today.  I agree with their assessment and plan.     Additionally  Does not give name but does move hand to request Prognosis poor without any viable therapeutic options Discharge telemetry and IV  Move to floor Continue supportive care

## 2013-08-02 NOTE — Clinical Social Work Placement (Signed)
Clinical Social Work Department CLINICAL SOCIAL WORK PLACEMENT NOTE 08/02/2013  Patient:  David [name], David [name]  Account Number:  [id number] [place] date:  07/25/2013  Clinical Social Worker:  Lovey Newcomer  Date/time:  07/27/2013 04:18 PM  Clinical Social Work is seeking post-discharge placement for this patient at the following level of care:   [place]   (*CSW will update this form in Epic as items are completed)   07/27/2013  Patient/family provided with [facility] Department of Clinical Social Work's list of facilities offering this level of care within the geographic area requested by the patient (or if unable, by the patient's family).  07/27/2013  Patient/family informed of their freedom to choose among providers that offer the needed level of care, that participate in Medicare, Medicaid or managed care program needed by the patient, have an available bed and are willing to accept the patient.  07/27/2013  Patient/family informed of [facility]' ownership interest in [facility], as well as of the fact that they are under no obligation to receive care at this facility.  PASARR submitted to EDS on  PASARR number received on   FL2 transmitted to all facilities in geographic area requested by pt/family on  07/27/2013 FL2 transmitted to all facilities within larger geographic area on   Patient informed that his/her managed care company has contracts with or will negotiate with  certain facilities, including the following:     Patient/family informed of bed offers received:  07/28/2013 Patient chooses bed at [facility] [place] Physician recommends and patient chooses bed at    Patient to be transferred to [place] on  08/02/2013 Patient to be transferred to facility by [facility] Patient and family notified of transfer on 08/02/2013 Name of family member notified:  Pamala Hurry and Kalman Shan (daughters)  The following  physician request were entered in Epic:   Additional Comments: Per MD patient ready for DC to [facility]. RN, patient's daughters and facility notified of discharge. RN given number for report. DC packet on chart. Ambulance transport requested. CSW signing off.    Liz Beach MSW, [place], [place], [phone]

## 2013-08-02 NOTE — Progress Notes (Signed)
Chaplain referred to family by nurse who said pt was near end of life. Pt's brother and several sisters were present and they were expecting more to come. Chaplain provided empathic listening as family discussed pt's health situation. They state they will be having a meeting today to discuss palliative care. At one point I noticed pt seemed to be looking at me. I told him who I was and spoke words of comfort. I assured family of chaplain's continuing availability if needed.

## 2013-08-02 NOTE — Progress Notes (Signed)
Pt transferred to [facility] via ambulance.

## 2013-08-02 NOTE — Discharge Instructions (Signed)
You were admitted for shortness of breath.  This was related to your heart failure.  Your liver was also found to be rather damaged during your admission.  You are finishing up a course of antibiotics for a pneumonia caused by inhaling some     Heart Failure Heart failure is a condition in which the heart has trouble pumping blood. This means your heart does not pump blood efficiently for your body to work well. In some cases of heart failure, fluid may back up into your lungs or you may have swelling (edema) in your lower legs. Heart failure is usually a long-term (chronic) condition. It is important for you to take good care of yourself and follow your caregiver's treatment plan. CAUSES  Some health conditions can cause heart failure. Those health conditions include:  High blood pressure (hypertension) causes the heart muscle to work harder than normal. When pressure in the blood vessels is high, the heart needs to pump (contract) with more force in order to circulate blood throughout the body. High blood pressure eventually causes the heart to become stiff and weak.  Coronary artery disease (CAD) is the buildup of cholesterol and fat (plaque) in the arteries of the heart. The blockage in the arteries deprives the heart muscle of oxygen and blood. This can cause chest pain and may lead to a heart attack. High blood pressure can also contribute to CAD.  Heart attack (myocardial infarction) occurs when 1 or more arteries in the heart become blocked. The loss of oxygen damages the muscle tissue of the heart. When this happens, part of the heart muscle dies. The injured tissue does not contract as well and weakens the heart's ability to pump blood.  Abnormal heart valves can cause heart failure when the heart valves do not open and close properly. This makes the heart muscle pump harder to keep the blood flowing.  Heart muscle disease (cardiomyopathy or myocarditis) is damage to the heart muscle from  a variety of causes. These can include drug or alcohol abuse, infections, or unknown reasons. These can increase the risk of heart failure.  Lung disease makes the heart work harder because the lungs do not work properly. This can cause a strain on the heart, leading it to fail.  Diabetes increases the risk of heart failure. High blood sugar contributes to high fat (lipid) levels in the blood. Diabetes can also cause slow damage to tiny blood vessels that carry important nutrients to the heart muscle. When the heart does not get enough oxygen and food, it can cause the heart to become weak and stiff. This leads to a heart that does not contract efficiently.  Other conditions can contribute to heart failure. These include abnormal heart rhythms, thyroid problems, and low blood counts (anemia). Certain unhealthy behaviors can increase the risk of heart failure. Those unhealthy behaviors include:  Being overweight.  Smoking or chewing tobacco.  Eating foods high in fat and cholesterol.  Abusing illicit drugs or alcohol.  Lacking physical activity. SYMPTOMS  Heart failure symptoms may vary and can be hard to detect. Symptoms may include:  Shortness of breath with activity, such as climbing stairs.  Persistent cough.  Swelling of the feet, ankles, legs, or abdomen.  Unexplained weight gain.  Difficulty breathing when lying flat (orthopnea).  Waking from sleep because of the need to sit up and get more air.  Rapid heartbeat.  Fatigue and loss of energy.  Feeling lightheaded, dizzy, or close to fainting.  Loss  of appetite.  Nausea.  Increased urination during the night (nocturia). DIAGNOSIS  A diagnosis of heart failure is based on your history, symptoms, physical examination, and diagnostic tests. Diagnostic tests for heart failure may include:  Echocardiography.  Electrocardiography.  Chest X-ray.  Blood tests.  Exercise stress test.  Cardiac  angiography.  Radionuclide scans. TREATMENT  Treatment is aimed at managing the symptoms of heart failure. Medicines, behavioral changes, or surgical intervention may be necessary to treat heart failure.  Medicines to help treat heart failure may include:  Angiotensin-converting enzyme (ACE) inhibitors. This type of medicine blocks the effects of a blood protein called angiotensin-converting enzyme. ACE inhibitors relax (dilate) the blood vessels and help lower blood pressure.  Angiotensin receptor blockers. This type of medicine blocks the actions of a blood protein called angiotensin. Angiotensin receptor blockers dilate the blood vessels and help lower blood pressure.  Water pills (diuretics). Diuretics cause the kidneys to remove salt and water from the blood. The extra fluid is removed through urination. This loss of extra fluid lowers the volume of blood the heart pumps.  Beta blockers. These prevent the heart from beating too fast and improve heart muscle strength.  Digitalis. This increases the force of the heartbeat.  Healthy behavior changes include:  Obtaining and maintaining a healthy weight.  Stopping smoking or chewing tobacco.  Eating heart healthy foods.  Limiting or avoiding alcohol.  Stopping illicit drug use.  Physical activity as directed by your caregiver.  Surgical treatment for heart failure may include:  A procedure to open blocked arteries, repair damaged heart valves, or remove damaged heart muscle tissue.  A pacemaker to improve heart muscle function and control certain abnormal heart rhythms.  An internal cardioverter defibrillator to treat certain serious abnormal heart rhythms.  A left ventricular assist device to assist the pumping ability of the heart. HOME CARE INSTRUCTIONS   Take your medicine as directed by your caregiver. Medicines are important in reducing the workload of your heart, slowing the progression of heart failure, and  improving your symptoms.  Do not stop taking your medicine unless directed by your caregiver.  Do not skip any dose of medicine.  Refill your prescriptions before you run out of medicine. Your medicines are needed every day.  Take over-the-counter medicine only as directed by your caregiver or pharmacist.  Engage in moderate physical activity if directed by your caregiver. Moderate physical activity can benefit some people. The elderly and people with severe heart failure should consult with a caregiver for physical activity recommendations.  Eat heart healthy foods. Food choices should be free of trans fat and low in saturated fat, cholesterol, and salt (sodium). Healthy choices include fresh or frozen fruits and vegetables, fish, lean meats, legumes, fat-free or low-fat dairy products, and whole grain or high fiber foods. Talk to a dietitian to learn more about heart healthy foods.  Limit sodium if directed by your caregiver. Sodium restriction may reduce symptoms of heart failure in some people. Talk to a dietitian to learn more about heart healthy seasonings.  Use healthy cooking methods. Healthy cooking methods include roasting, grilling, broiling, baking, poaching, steaming, or stir-frying. Talk to a dietitian to learn more about healthy cooking methods.  Limit fluids if directed by your caregiver. Fluid restriction may reduce symptoms of heart failure in some people.  Weigh yourself every day. Daily weights are important in the early recognition of excess fluid. You should weigh yourself every morning after you urinate and before you eat breakfast.  Wear the same amount of clothing each time you weigh yourself. Record your daily weight. Provide your caregiver with your weight record.  Monitor and record your blood pressure if directed by your caregiver.  Check your pulse if directed by your caregiver.  Lose weight if directed by your caregiver. Weight loss may reduce symptoms of heart  failure in some people.  Stop smoking or chewing tobacco. Nicotine makes your heart work harder by causing your blood vessels to constrict. Do not use nicotine gum or patches before talking to your caregiver.  Schedule and attend follow-up visits as directed by your caregiver. It is important to keep all your appointments.  Limit alcohol intake to no more than 1 drink per day for nonpregnant women and 2 drinks per day for men. Drinking more than that is harmful to your heart. Tell your caregiver if you drink alcohol several times a week. Talk with your caregiver about whether alcohol is safe for you. If your heart has already been damaged by alcohol or you have severe heart failure, drinking alcohol should be stopped completely.  Stop illicit drug use.  Stay up-to-date with immunizations. It is especially important to prevent respiratory infections through current pneumococcal and influenza immunizations.  Manage other health conditions such as hypertension, diabetes, thyroid disease, or abnormal heart rhythms as directed by your caregiver.  Learn to manage stress.  Plan rest periods when fatigued.  Learn strategies to manage high temperatures. If the weather is extremely hot:  Avoid vigorous physical activity.  Use air conditioning or fans or seek a cooler location.  Avoid caffeine and alcohol.  Wear loose-fitting, lightweight, and light-colored clothing.  Learn strategies to manage cold temperatures. If the weather is extremely cold:  Avoid vigorous physical activity.  Layer clothes.  Wear mittens or gloves, a hat, and a scarf when going outside.  Avoid alcohol.  Obtain ongoing education and support as needed.  Participate or seek rehabilitation as needed to maintain or improve independence and quality of life. SEEK MEDICAL CARE IF:   Your weight increases by 03 lb/1.4 kg in 1 day or 05 lb/2.3 kg in a week.  You have increasing shortness of breath that is unusual for  you.  You are unable to participate in your usual physical activities.  You tire easily.  You cough more than normal, especially with physical activity.  You have any or more swelling in areas such as your hands, feet, ankles, or abdomen.  You are unable to sleep because it is hard to breathe.  You feel like your heart is beating fast (palpitations).  You become dizzy or lightheaded upon standing up. SEEK IMMEDIATE MEDICAL CARE IF:   You have difficulty breathing.  There is a change in mental status such as decreased alertness or difficulty with concentration.  You have a pain or discomfort in your chest.  You have an episode of fainting (syncope). MAKE SURE YOU:   Understand these instructions.  Will watch your condition.  Will get help right away if you are not doing well or get worse. Document Released: 01/07/2005 Document Revised: 05/04/2012 Document Reviewed: 01/30/2012 [facility] Patient Information 2015 [place], [place]. This information is not intended to replace advice given to you by your health care provider. Make sure you discuss any questions you have with your health care provider.

## 2013-08-02 NOTE — Progress Notes (Signed)
Report called to Ernie Avena who will be receiving pt at [facility]. Family aware of pt transfer.

## 2013-08-02 NOTE — Progress Notes (Signed)
Utilization review completed.  

## 2013-08-02 NOTE — Progress Notes (Signed)
I have spoken with [name] daughter, [name] [phone]. She is open to meeting with me to further discuss goals of care for her father. Plan to meet today 7/13 ~1200 noon.   Vinie Sill, NP Palliative Medicine Team Pager # [phone] (M-F 8a-5p) Team Phone # [phone] (Nights/Weekends)

## 2013-08-02 NOTE — Discharge Summary (Signed)
I agree with the discharge summary as documented.   Sears Oran MD  

## 2013-08-03 ENCOUNTER — Non-Acute Institutional Stay (SKILLED_NURSING_FACILITY): Payer: PRIVATE HEALTH INSURANCE | Admitting: Internal Medicine

## 2013-08-03 ENCOUNTER — Encounter: Payer: Self-pay | Admitting: Internal Medicine

## 2013-08-03 DIAGNOSIS — F0391 Unspecified dementia with behavioral disturbance: Secondary | ICD-10-CM

## 2013-08-03 DIAGNOSIS — R7309 Other abnormal glucose: Secondary | ICD-10-CM

## 2013-08-03 DIAGNOSIS — J438 Other emphysema: Secondary | ICD-10-CM

## 2013-08-03 DIAGNOSIS — J431 Panlobular emphysema: Secondary | ICD-10-CM

## 2013-08-03 DIAGNOSIS — K7200 Acute and subacute hepatic failure without coma: Secondary | ICD-10-CM

## 2013-08-03 DIAGNOSIS — E0789 Other specified disorders of thyroid: Secondary | ICD-10-CM

## 2013-08-03 DIAGNOSIS — E038 Other specified hypothyroidism: Secondary | ICD-10-CM

## 2013-08-03 DIAGNOSIS — E034 Atrophy of thyroid (acquired): Secondary | ICD-10-CM

## 2013-08-03 DIAGNOSIS — Z515 Encounter for palliative care: Secondary | ICD-10-CM

## 2013-08-03 DIAGNOSIS — I5022 Chronic systolic (congestive) heart failure: Secondary | ICD-10-CM

## 2013-08-03 DIAGNOSIS — J690 Pneumonitis due to inhalation of food and vomit: Secondary | ICD-10-CM

## 2013-08-03 DIAGNOSIS — I4892 Unspecified atrial flutter: Secondary | ICD-10-CM

## 2013-08-03 DIAGNOSIS — R739 Hyperglycemia, unspecified: Secondary | ICD-10-CM

## 2013-08-03 DIAGNOSIS — F03918 Unspecified dementia, unspecified severity, with other behavioral disturbance: Secondary | ICD-10-CM

## 2013-08-03 DIAGNOSIS — I509 Heart failure, unspecified: Secondary | ICD-10-CM

## 2013-08-03 DIAGNOSIS — Z7901 Long term (current) use of anticoagulants: Secondary | ICD-10-CM

## 2013-08-08 ENCOUNTER — Encounter: Payer: Self-pay | Admitting: Internal Medicine

## 2013-08-08 DIAGNOSIS — F0391 Unspecified dementia with behavioral disturbance: Secondary | ICD-10-CM | POA: Insufficient documentation

## 2013-08-08 DIAGNOSIS — F03918 Unspecified dementia, unspecified severity, with other behavioral disturbance: Secondary | ICD-10-CM | POA: Insufficient documentation

## 2013-08-08 DIAGNOSIS — K7200 Acute and subacute hepatic failure without coma: Secondary | ICD-10-CM | POA: Insufficient documentation

## 2013-08-08 DIAGNOSIS — R739 Hyperglycemia, unspecified: Secondary | ICD-10-CM | POA: Insufficient documentation

## 2013-08-08 NOTE — Assessment & Plan Note (Signed)
Elevated on BMP, A1c 7.0. Given acute illness, held treatment; with underlying co morbidities A1c 7.0 is acceptable

## 2013-08-08 NOTE — Assessment & Plan Note (Signed)
Patient was noted to have elevated WBC count of 15.5. Was initially thought to be marginalization from recent steroid use. When leukocytosis persisted after several days off Prednisone, CXR showed R base infiltrate. Likely related to aspiration in this demented elderly man. Zosyn and Vanc started, made NPO, and speech eval. Speech eval showed good swallowing when coached, recommended dys-1 diet with thin liquids with the caveat that he is still at an increased risk of aspiration 2/2 mental status. Switched to PO Augmentin 7/11,after showing clinical improvement

## 2013-08-08 NOTE — Assessment & Plan Note (Signed)
AST and ALT mildly elevated on admission. No h/o Liver disease. AST and ALT quickly rose during admission to peak of 787 and 1075, respectively. This was thought to be due to shock liver, as patient was persistently hypotensive

## 2013-08-08 NOTE — Assessment & Plan Note (Signed)
Palliative care was consulted on admission to have goals of care discussion with family who had been difficult to contact on previous admission. This was especially important as patient was noted to have significantly worsened CHF and shock liver. Family could not be contacted for days, so Ethics c/s was placed. Ethics team said that if POA could not be reached and due diligence had been done, could make medical decisions in his best interest. Family meeting with 2 daughters 7/13, decided SNF with palliative care to follow.

## 2013-08-08 NOTE — Assessment & Plan Note (Signed)
CXR was clear, but showed clinical improvement in dyspnea with duonebs. Was started on treatment for COPD exacerbation in ED (Levaquin and Prednisone). His BNP was found to be 21K on admission, even though he appeared clinically euvolemic. He was given IV Lasix 20mg  x1. Cardiology was consulted for BNP and chronic a flutter with hypotension. His Echo (7/6) showed a reduced EF of 15%, down from 40-45% in 08/2012, mod LA dilation and diffuse hypokinesis. Prednisone and Levaquin were d/c'd 7/6, as respiratory status was stable on RA. Continued Metoprolol 25mg  BID

## 2013-08-08 NOTE — Assessment & Plan Note (Signed)
s/p ablation and cardioversion in 2008 and not a candidate for further treatment. Goal HR 110 (remained in 130s during admission). He was restarted on Metoprolol 25mg  BID (which was increased to 50mg  BID on last admission but BP could not tolerate higher dose). Not a candidate for rate control due to contraindications to amio (ILD), dilt (LV dysfxn), and digoxin (h/o bradycardia with use). Home coumadin was initially continued per pharmacy. D/c'd coumadin 7/13, as risks outweigh benefits in this demented elderly man with poor overall health status. Started aspirin

## 2013-08-08 NOTE — Assessment & Plan Note (Signed)
Patient appears to have baseline dementia. Home medications: Cymbalta, Remeron, Invega continued during admission

## 2013-08-08 NOTE — Assessment & Plan Note (Signed)
NOT on coumadin

## 2013-08-08 NOTE — Assessment & Plan Note (Signed)
Not on synthroid prior to admission. TSH from last admission was low at 0.237 however FT3 nl and FT4nl.

## 2013-08-08 NOTE — Assessment & Plan Note (Signed)
As above.

## 2013-08-21 NOTE — Progress Notes (Signed)
MRN: [id number] Name: David [name]  Sex: male Age: 73 y.o. DOB: 06-16-1940  [place] #: [facility] Facility/Room:126B Level Of Care: SNF Provider: Inocencio Homes D Emergency Contacts: Extended Emergency Contact Information Primary Emergency Contact: Domenic [place] Mobile Phone: [phone] Relation: Daughter Secondary Emergency Contact: Zamudio,Beverly & [name] Address:      [place], [place] Phone: [phone] Relation: Niece  Code Status: DNR  Allergies: Ace inhibitors; Oat flour; and Other  Chief Complaint  Patient presents with  . nursing home admission    HPI: Patient is 73 y.o. male who is admitted to SNF after acute on chronic systolic CHF and PNA.  Past Medical History  Diagnosis Date  . Community acquired pneumonia     Right middle lobe community-acquired pneumonia  . Toxic metabolic encephalopathy   . Chronic atrial flutter     a. rate controlled with bb therapy;  b. h/o bradycardia when on digoxin 12/2011.  . Tobacco abuse   . History of coronary artery disease 2008    a. Cath at [facility]: RCA 95%, RI 60%  - medical therapy  . Ischemic cardiomyopathy     a. prev documented EF of 20-30%;  b. 09/2012 Echo: EF 40-45%, diff HK, mild MR, mild bi-atrial enlargement.  . History of prostate cancer   . Chronic obstructive pulmonary disease     with mild bronchospasm, improved  . Hypertension   . History of alcohol abuse     History of prior heavy alcohol abuse  . History of depression   . Altered mental status 2012   . Noncompliance   . Depression   . Ulcer   . Hx of migraines   . Hyperlipidemia   . Colon cancer     Past Surgical History  Procedure Laterality Date  . Appendectomy        Medication List       This list is accurate as of: Aug 10, 2013 11:59 PM.  Always use your most recent med list.               amoxicillin-clavulanate 875-125 MG per tablet  Commonly known  as:  AUGMENTIN  Take 1 tablet by mouth 2 (two) times daily.     amoxicillin-clavulanate 875-125 MG per tablet  Commonly known as:  AUGMENTIN  Take 1 tablet by mouth every 12 (twelve) hours.     aspirin 81 MG EC tablet  Take 1 tablet (81 mg total) by mouth daily.     beclomethasone 40 MCG/ACT inhaler  Commonly known as:  QVAR  Inhale 2 puffs into the lungs 2 (two) times daily.     DULoxetine 60 MG capsule  Commonly known as:  CYMBALTA  Take 60 mg by mouth daily.     folic acid 1 MG tablet  Commonly known as:  FOLVITE  Take 1 tablet (1 mg total) by mouth daily.     guaiFENesin 600 MG 12 hr tablet  Commonly known as:  MUCINEX  Take 1 tablet (600 mg total) by mouth 2 (two) times daily.     lactulose 10 GM/15ML solution  Commonly known as:  CHRONULAC  Take 20 g by mouth daily.     metoprolol tartrate 25 MG tablet  Commonly known as:  LOPRESSOR  Take 1 tablet (25 mg total) by mouth 2 (two) times daily.     mirtazapine 15 MG tablet  Commonly known as:  REMERON  Take 15 mg by mouth at bedtime.     multivitamin with minerals Tabs tablet  Take 1 tablet by mouth daily.     paliperidone 6 MG 24 hr tablet  Commonly known as:  INVEGA  Take 6 mg by mouth daily.     rosuvastatin 20 MG tablet  Commonly known as:  CRESTOR  Take 20 mg by mouth daily.     thiamine 100 MG tablet  Commonly known as:  VITAMIN B-1  Take 100 mg by mouth daily.     tiotropium 18 MCG inhalation capsule  Commonly known as:  SPIRIVA  Place 18 mcg into inhaler and inhale daily.        Meds ordered this encounter  Medications  . amoxicillin-clavulanate (AUGMENTIN) 875-125 MG per tablet    Sig: Take 1 tablet by mouth 2 (two) times daily.    Immunization History  Administered Date(s) Administered  . Influenza Split 11/02/2012    History  Substance Use Topics  . Smoking status: Current Every Day Smoker -- 0.50 packs/day for 40 years    Types: Cigarettes  . Smokeless tobacco: Never Used  .  Alcohol Use: No    Family history is noncontributory    Review of Systems  DATA OBTAINED: from patient; no c/o GENERAL: Feels well no fevers, fatigue, appetite changes SKIN: No itching, rash or wounds EYES: No eye pain, redness, discharge EARS: No earache, tinnitus, change in hearing NOSE: No congestion, drainage or bleeding  MOUTH/THROAT: No mouth or tooth pain, No sore throat RESPIRATORY: No cough, wheezing, SOB CARDIAC: No chest pain, palpitations, lower extremity edema  GI: No abdominal pain, No N/V/D or constipation, No heartburn or reflux  GU: No dysuria, frequency or urgency, or incontinence  MUSCULOSKELETAL: No unrelieved bone/joint pain NEUROLOGIC: No headache, dizziness or focal weakness PSYCHIATRIC:  No behavior issue.   Filed Vitals:   08-16-2013 1734  BP: 98/66  Pulse: 79  Temp: 98.2 F (36.8 C)  Resp: 22    Physical Exam  GENERAL APPEARANCE: Alert,mod conversant. Appropriately groomed. No acute distress.  SKIN: No diaphoresis rash HEAD: Normocephalic, atraumatic  EYES: Conjunctiva/lids clear. Pupils round, reactive. EOMs intact.  EARS: External exam WNL, canals clear. Hearing grossly normal.  NOSE: No deformity or discharge.  MOUTH/THROAT: Lips w/o lesions. RESPIRATORY: Breathing is even, unlabored. Lung sounds are clear   CARDIOVASCULAR: Heart RRR no murmurs, rubs or gallops. No peripheral edema.   GASTROINTESTINAL: Abdomen is soft, non-tender, not distended w/ normal bowel sounds GENITOURINARY: Bladder non tender, not distended  MUSCULOSKELETAL: No abnormal joints or musculature NEUROLOGIC: Oriented X3. Cranial nerves 2-12 grossly intact. Moves all extremities no tremor. PSYCHIATRIC: Mood and affect appropriate to situation, no behavioral issues  Patient Active Problem List   Diagnosis Date Noted  . Shock liver 08/08/2013  . Dementia with behavioral disturbance 08/08/2013  . Hyperglycemia 08/08/2013  . Atrial fibrillation 08/02/2013  . Palliative  care encounter 07/30/2013  . Aspiration pneumonia 07/30/2013  . Dyspnea 07/26/2013  . Acute exacerbation of chronic obstructive pulmonary disease (COPD) 07/17/2013  . Protein-calorie malnutrition, severe 01/27/2013  . Acute on chronic systolic heart failure 11/91/4782  . Syphilis 12/31/2011  . Healthcare-associated pneumonia 12/24/2011  . Chronic systolic congestive heart failure, NYHA class 3 12/24/2011  . COPD with emphysema 12/24/2011  . Thrombocytopenia 12/24/2011  . Warfarin anticoagulation 12/24/2011  . Hypothyroidism 08/01/2011  . HTN (hypertension) 07/02/2011  . CAD (coronary artery disease) 04/11/2011  . Ischemic cardiomyopathy 04/11/2011  . Tobacco abuse 04/11/2011  .  Atrial flutter 04/26/2010    CBC    Component Value Date/Time   WBC 18.8* 08/02/2013 0310   RBC 4.22 08/02/2013 0310   HGB 12.2* 08/02/2013 0310   HCT 38.3* 08/02/2013 0310   PLT 104* 08/02/2013 0310   MCV 90.8 08/02/2013 0310   LYMPHSABS 1.1 07/17/2013 1549   MONOABS 1.1* 07/17/2013 1549   EOSABS 0.0 07/17/2013 1549   BASOSABS 0.0 07/17/2013 1549    CMP     Component Value Date/Time   NA 143 08/02/2013 0310   K 4.9 08/02/2013 0310   CL 102 08/02/2013 0310   CO2 23 08/02/2013 0310   GLUCOSE 170* 08/02/2013 0310   BUN 83* 08/02/2013 0310   CREATININE 1.81* 08/02/2013 0310   CALCIUM 8.8 08/02/2013 0310   PROT 5.9* 08/02/2013 0310   ALBUMIN 2.6* 08/02/2013 0310   AST 787* 08/02/2013 0310   ALT 1075* 08/02/2013 0310   ALKPHOS 212* 08/02/2013 0310   BILITOT 2.1* 08/02/2013 0310   GFRNONAA 35* 08/02/2013 0310   GFRAA 41* 08/02/2013 0310    Assessment and Plan  Chronic systolic congestive heart failure, NYHA class 3 CXR was clear, but showed clinical improvement in dyspnea with duonebs. Was started on treatment for COPD exacerbation in ED (Levaquin and Prednisone). His BNP was found to be 21K on admission, even though he appeared clinically euvolemic. He was given IV Lasix 20mg  x1. Cardiology was consulted for BNP  and chronic a flutter with hypotension. His Echo (7/6) showed a reduced EF of 15%, down from 40-45% in 08/2012, mod LA dilation and diffuse hypokinesis. Prednisone and Levaquin were d/c'd 7/6, as respiratory status was stable on RA. Continued Metoprolol 25mg  BID   COPD with emphysema As above  Atrial flutter s/p ablation and cardioversion in 2008 and not a candidate for further treatment. Goal HR 110 (remained in 130s during admission). He was restarted on Metoprolol 25mg  BID (which was increased to 50mg  BID on last admission but BP could not tolerate higher dose). Not a candidate for rate control due to contraindications to amio (ILD), dilt (LV dysfxn), and digoxin (h/o bradycardia with use). Home coumadin was initially continued per pharmacy. D/c'd coumadin 7/13, as risks outweigh benefits in this demented elderly man with poor overall health status. Started aspirin   Aspiration pneumonia Patient was noted to have elevated WBC count of 15.5. Was initially thought to be marginalization from recent steroid use. When leukocytosis persisted after several days off Prednisone, CXR showed R base infiltrate. Likely related to aspiration in this demented elderly man. Zosyn and Vanc started, made NPO, and speech eval. Speech eval showed good swallowing when coached, recommended dys-1 diet with thin liquids with the caveat that he is still at an increased risk of aspiration 2/2 mental status. Switched to PO Augmentin 7/11,after showing clinical improvement   Shock liver AST and ALT mildly elevated on admission. No h/o Liver disease. AST and ALT quickly rose during admission to peak of 787 and 1075, respectively. This was thought to be due to shock liver, as patient was persistently hypotensive   Hypothyroidism Not on synthroid prior to admission. TSH from last admission was low at 0.237 however FT3 nl and FT4nl.      Dementia with behavioral disturbance Patient appears to have baseline dementia. Home  medications: Cymbalta, Remeron, Invega continued during admission   Hyperglycemia Elevated on BMP, A1c 7.0. Given acute illness, held treatment; with underlying co morbidities A1c 7.0 is acceptable   Palliative care encounter Palliative care was  consulted on admission to have goals of care discussion with family who had been difficult to contact on previous admission. This was especially important as patient was noted to have significantly worsened CHF and shock liver. Family could not be contacted for days, so Ethics c/s was placed. Ethics team said that if POA could not be reached and due diligence had been done, could make medical decisions in his best interest. Family meeting with 2 daughters 7/13, decided SNF with palliative care to follow.      Warfarin anticoagulation NOT on coumadin    Hennie Duos, MD

## 2013-08-21 DEATH — deceased

## 2013-12-11 IMAGING — CR DG CHEST 1V PORT
1 series · 1 of 1 positions shown · non-contrast
Comparison: [DATE]/[id number] hours

CLINICAL DATA: Central line placement

PORTABLE CHEST - 1 VIEW

[AP]
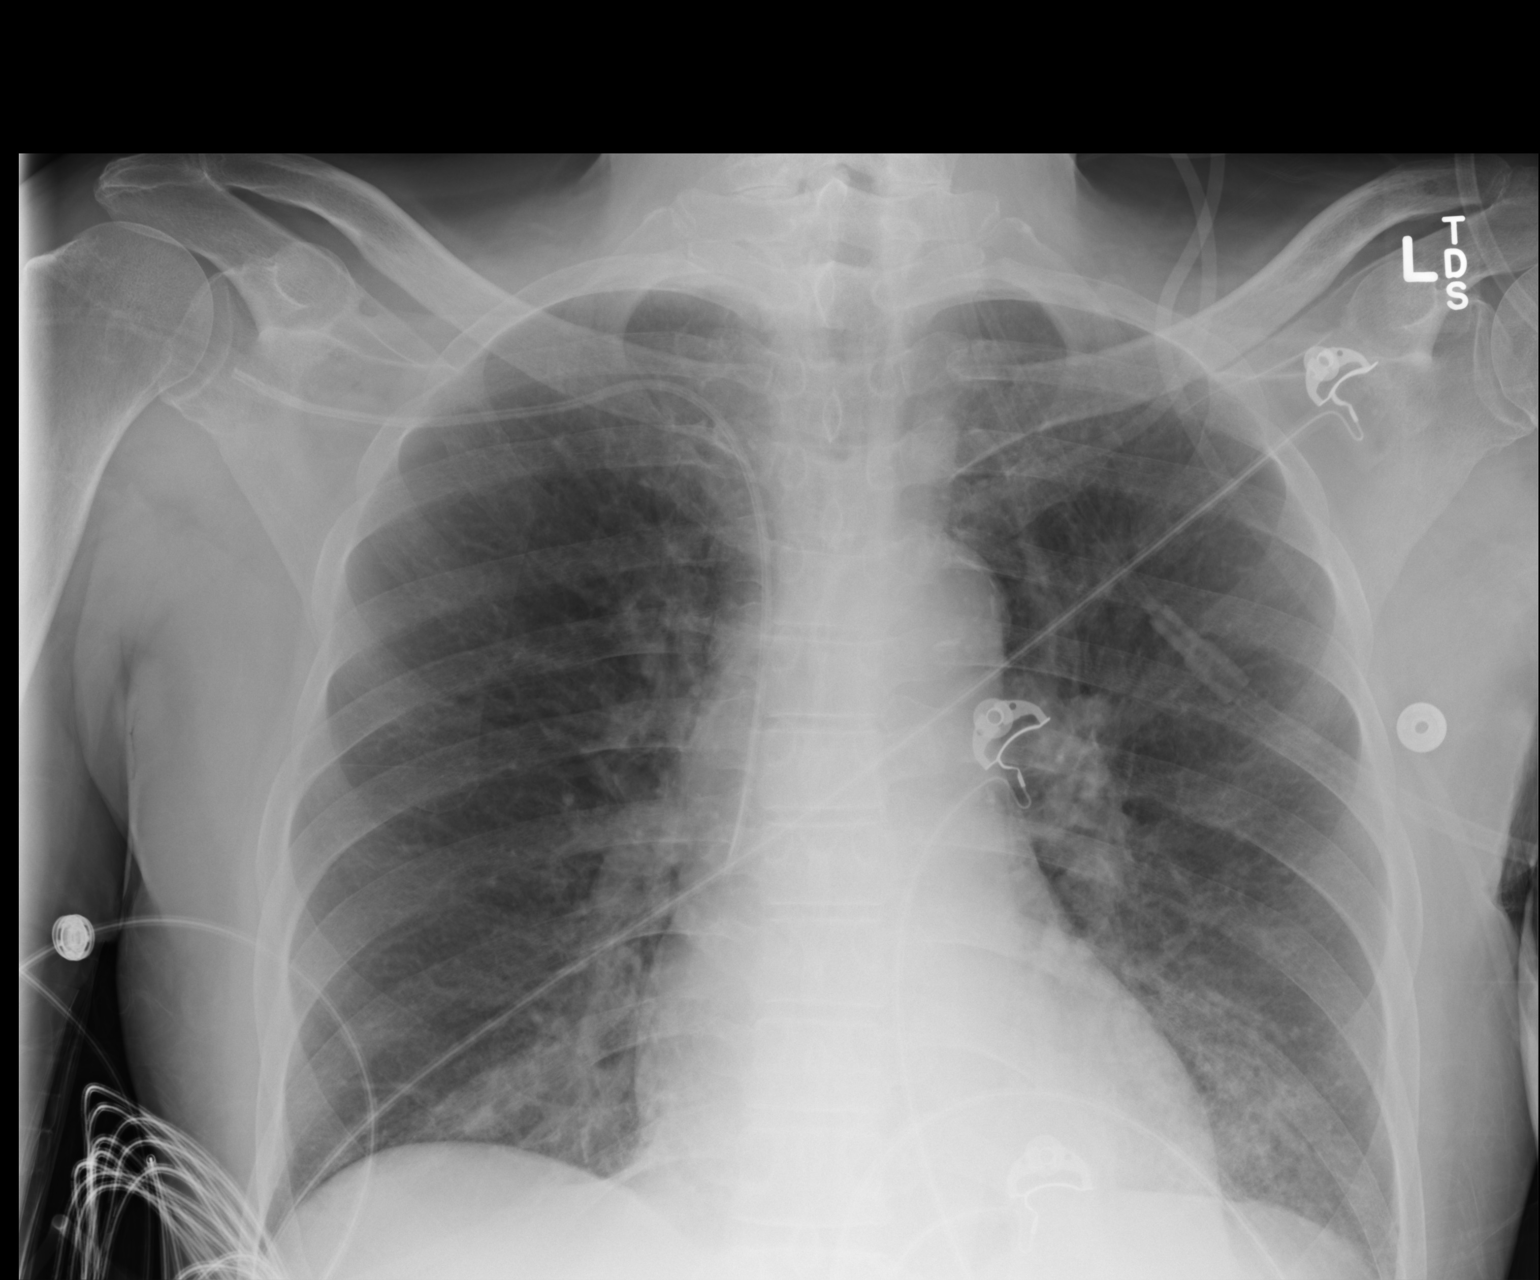

[1 of 1 positions shown; findings below may reference images not displayed]

FINDINGS: Status post placement of right subclavian central line
with tip projecting over the cavoatrial junction.  No pneumothorax.
Mild bilateral lower lobe infiltrates have developed.  Otherwise
unchanged.
IMPRESSION: Central line as described. Development of mild bilateral lower lobe
infiltrates.

## 2014-01-28 IMAGING — CR DG CHEST 2V
2 series · 2 of 2 positions shown · non-contrast
Comparison: 10/02/2012

CLINICAL DATA: Pneumonia

EXAM:
CHEST  2 VIEW

[view not recorded (1 of 2)]
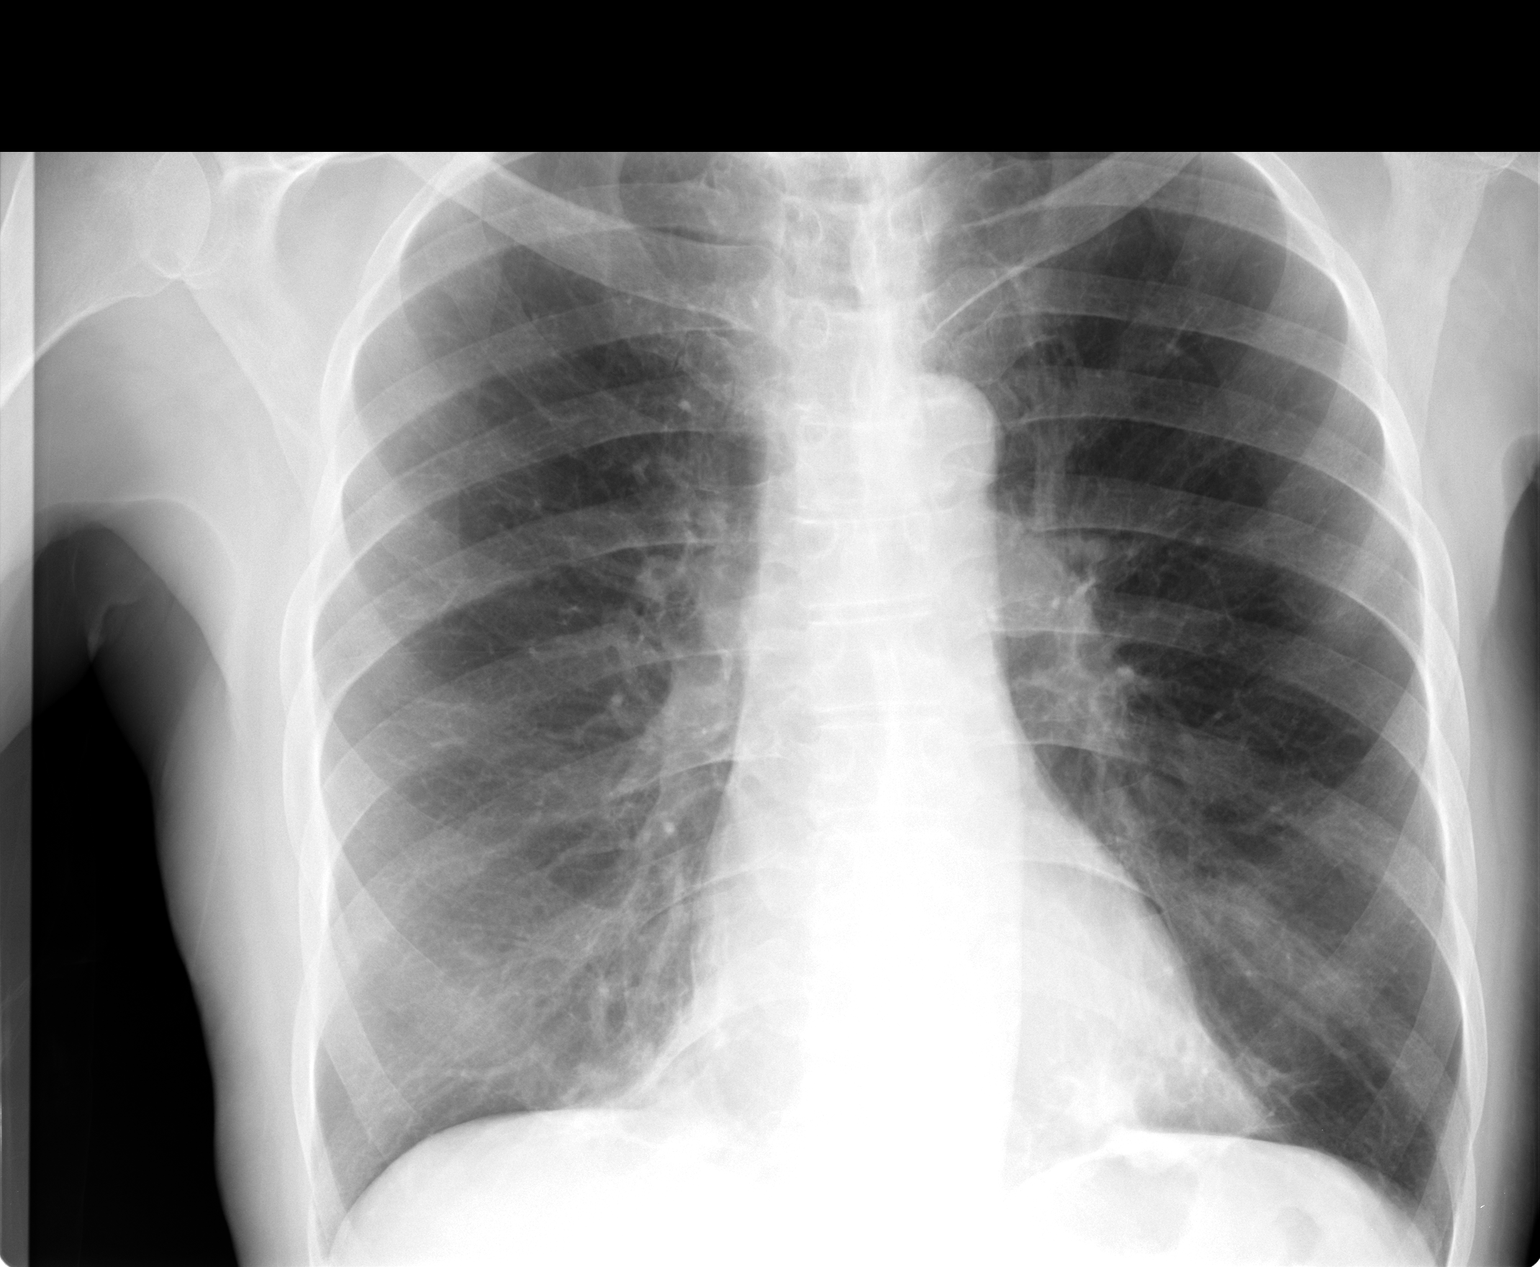

[view not recorded (2 of 2)]
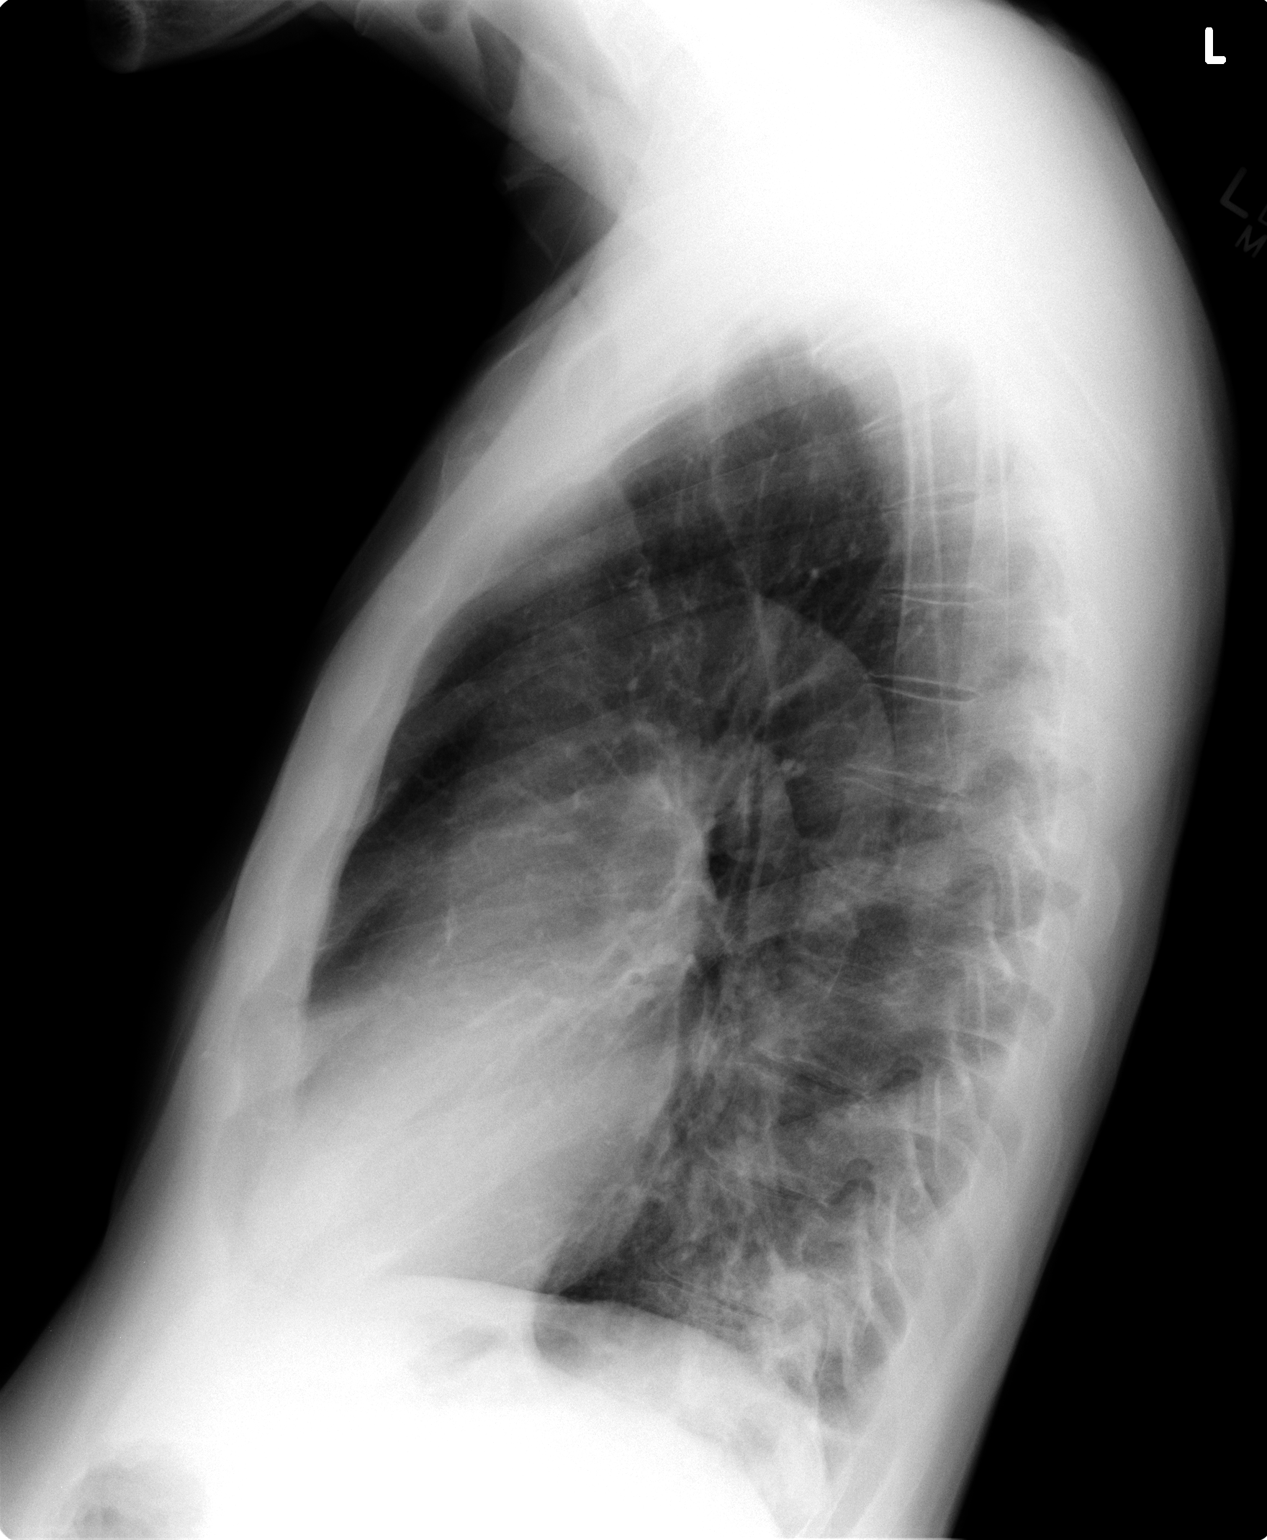

[2 of 2 positions shown; findings below may reference images not displayed]

FINDINGS: Airspace disease at the posterior right lung base has improved.
There is some residual disease. No pleural effusion or pneumothorax.
Normal heart size. Lungs remain hyperaerated.
IMPRESSION: Improved right lower lobe pneumonia. Continued followup until
complete resolution is recommended

## 2014-10-12 IMAGING — CR DG CHEST 1V PORT
1 series · 1 of 1 positions shown · non-contrast
Comparison: July 17, 2013

CLINICAL DATA: Difficulty breathing

EXAM:
PORTABLE CHEST - 1 VIEW

[AP]
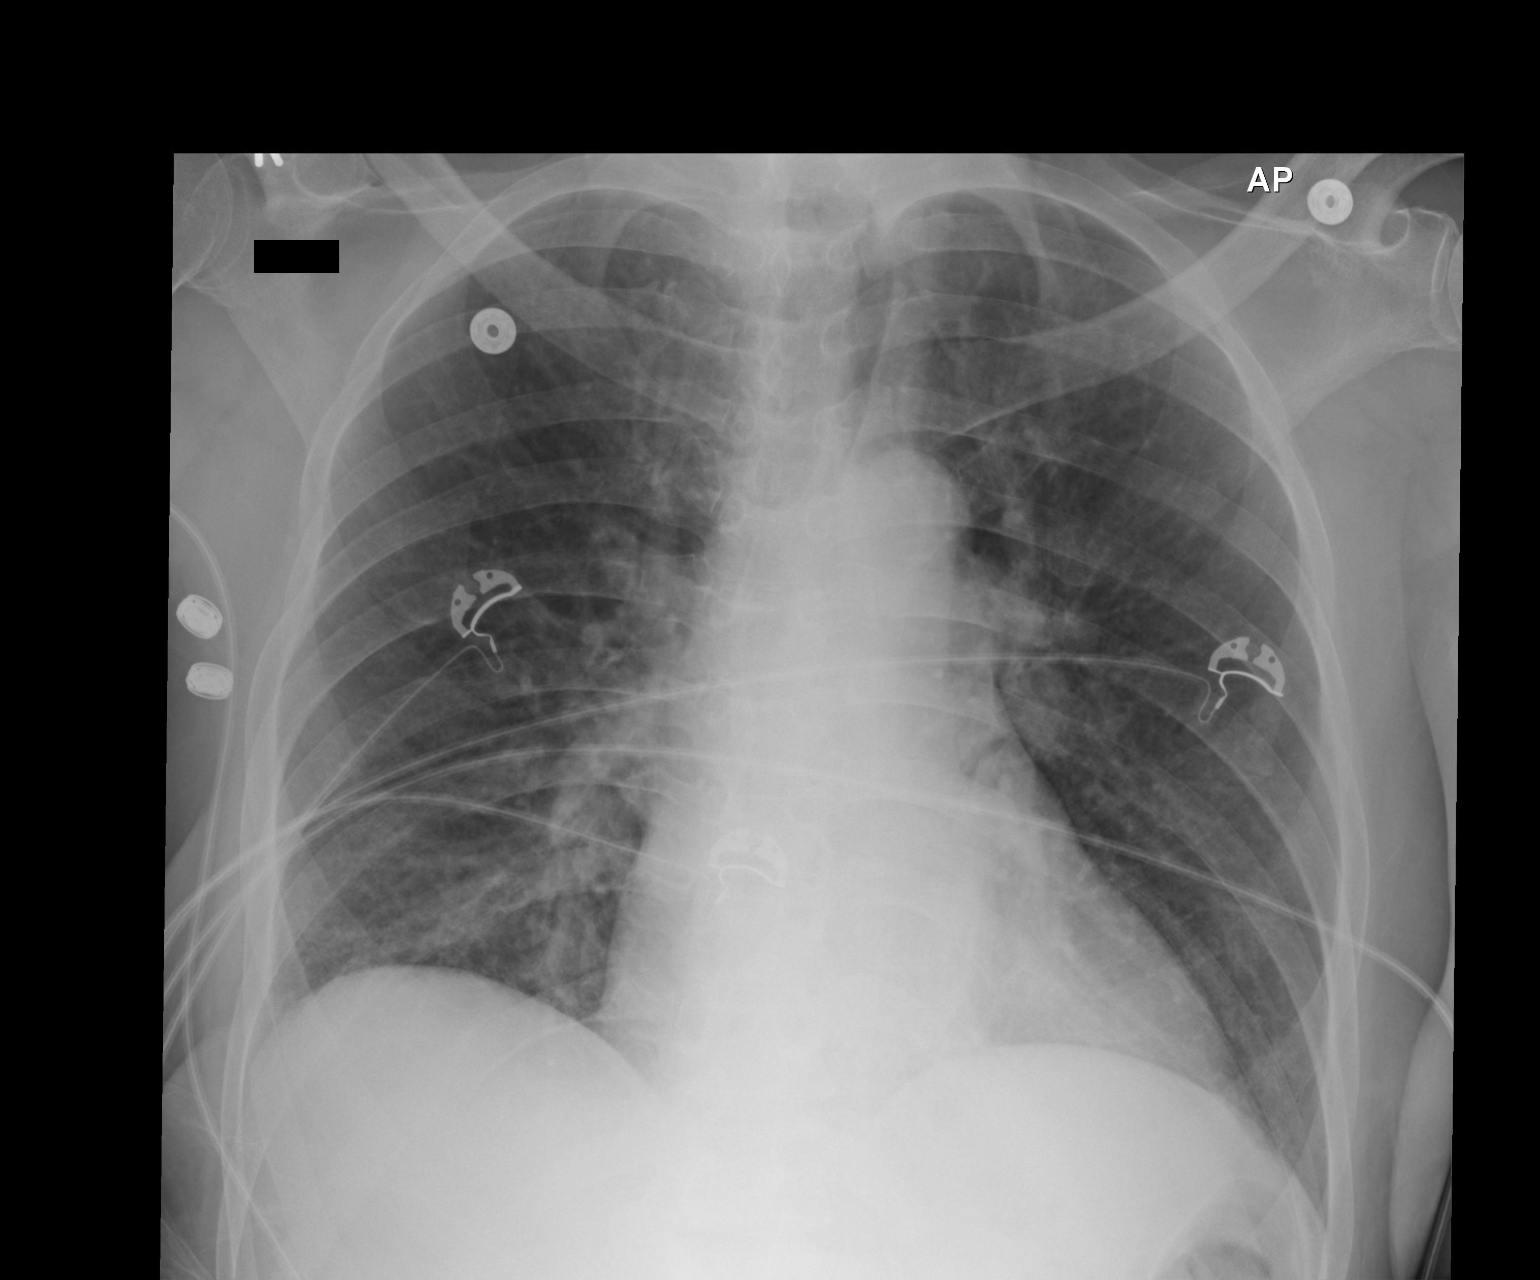

[1 of 1 positions shown; findings below may reference images not displayed]

FINDINGS: There is no edema or consolidation. Heart is borderline enlarged
with normal pulmonary vascularity. No adenopathy. No pneumothorax.
No bone lesions.
IMPRESSION: No edema or consolidation.  Heart borderline enlarged but stable.

## 2014-10-16 IMAGING — CR DG CHEST 1V PORT
1 series · 1 of 1 positions shown · non-contrast
Comparison: 07/21/2013

CLINICAL DATA: Shortness of breath.

EXAM:
PORTABLE CHEST - 1 VIEW

[AP]
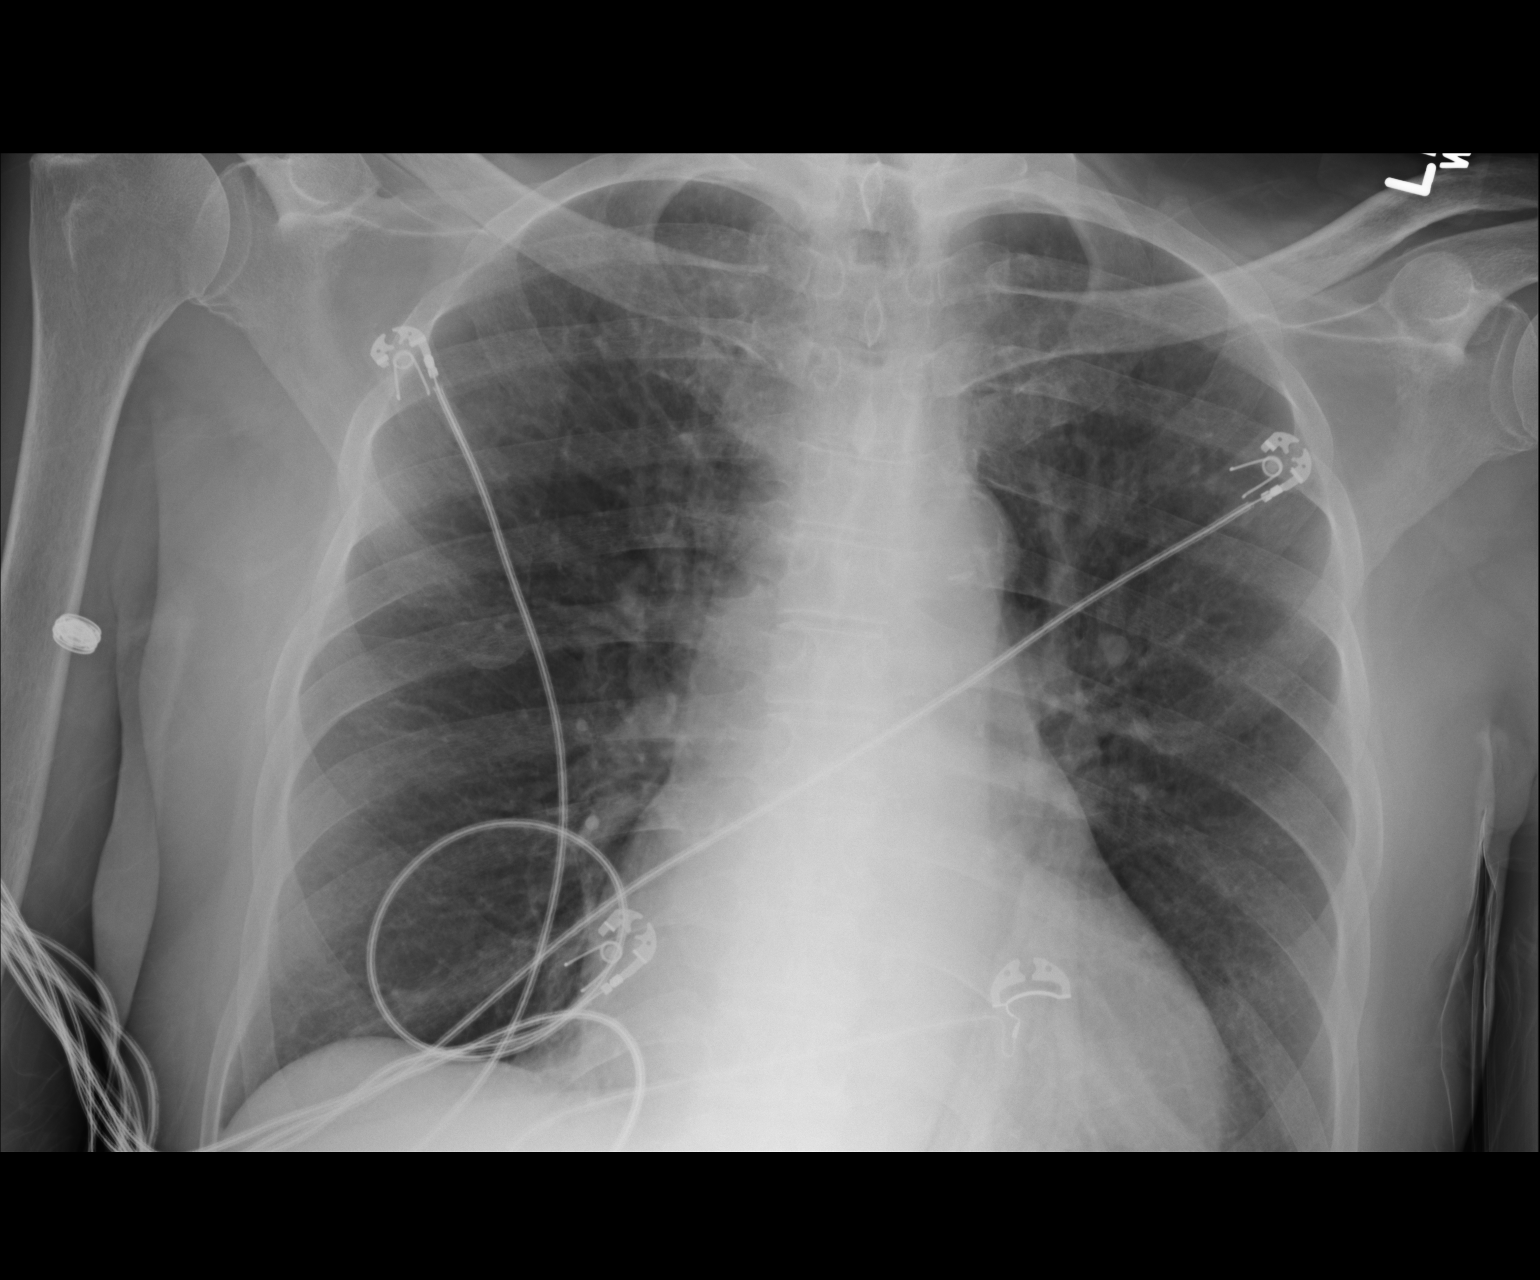

[1 of 1 positions shown; findings below may reference images not displayed]

FINDINGS: The heart size and mediastinal contours are within normal limits.
Both lungs are clear. The visualized skeletal structures are
unremarkable.
IMPRESSION: No active disease.
